# Patient Record
Sex: Female | Born: 1940 | Race: White | Hispanic: No | Smoking: Never smoker
Health system: Southern US, Community
[De-identification: ages and names within clinical notes are randomized; demographics above are authoritative.]

## PROBLEM LIST (undated history)

## (undated) DIAGNOSIS — M810 Age-related osteoporosis without current pathological fracture: Secondary | ICD-10-CM

## (undated) DIAGNOSIS — E669 Obesity, unspecified: Secondary | ICD-10-CM

## (undated) DIAGNOSIS — I2089 Other forms of angina pectoris: Secondary | ICD-10-CM

## (undated) DIAGNOSIS — E876 Hypokalemia: Secondary | ICD-10-CM

## (undated) DIAGNOSIS — I10 Essential (primary) hypertension: Secondary | ICD-10-CM

## (undated) DIAGNOSIS — C50919 Malignant neoplasm of unspecified site of unspecified female breast: Secondary | ICD-10-CM

## (undated) DIAGNOSIS — R531 Weakness: Secondary | ICD-10-CM

## (undated) DIAGNOSIS — R339 Retention of urine, unspecified: Secondary | ICD-10-CM

## (undated) DIAGNOSIS — A498 Other bacterial infections of unspecified site: Secondary | ICD-10-CM

## (undated) DIAGNOSIS — I208 Other forms of angina pectoris: Secondary | ICD-10-CM

## (undated) DIAGNOSIS — I639 Cerebral infarction, unspecified: Secondary | ICD-10-CM

## (undated) DIAGNOSIS — J189 Pneumonia, unspecified organism: Secondary | ICD-10-CM

## (undated) DIAGNOSIS — C50911 Malignant neoplasm of unspecified site of right female breast: Secondary | ICD-10-CM

## (undated) DIAGNOSIS — E079 Disorder of thyroid, unspecified: Secondary | ICD-10-CM

## (undated) DIAGNOSIS — IMO0002 Reserved for concepts with insufficient information to code with codable children: Secondary | ICD-10-CM

## (undated) DIAGNOSIS — N319 Neuromuscular dysfunction of bladder, unspecified: Secondary | ICD-10-CM

## (undated) DIAGNOSIS — K219 Gastro-esophageal reflux disease without esophagitis: Secondary | ICD-10-CM

## (undated) DIAGNOSIS — R1310 Dysphagia, unspecified: Secondary | ICD-10-CM

## (undated) DIAGNOSIS — B958 Unspecified staphylococcus as the cause of diseases classified elsewhere: Secondary | ICD-10-CM

## (undated) DIAGNOSIS — J9600 Acute respiratory failure, unspecified whether with hypoxia or hypercapnia: Secondary | ICD-10-CM

## (undated) DIAGNOSIS — I219 Acute myocardial infarction, unspecified: Secondary | ICD-10-CM

## (undated) DIAGNOSIS — M1990 Unspecified osteoarthritis, unspecified site: Secondary | ICD-10-CM

## (undated) DIAGNOSIS — I209 Angina pectoris, unspecified: Secondary | ICD-10-CM

## (undated) DIAGNOSIS — J302 Other seasonal allergic rhinitis: Secondary | ICD-10-CM

## (undated) DIAGNOSIS — A419 Sepsis, unspecified organism: Secondary | ICD-10-CM

## (undated) DIAGNOSIS — G809 Cerebral palsy, unspecified: Secondary | ICD-10-CM

## (undated) DIAGNOSIS — J9690 Respiratory failure, unspecified, unspecified whether with hypoxia or hypercapnia: Secondary | ICD-10-CM

## (undated) DIAGNOSIS — IMO0001 Reserved for inherently not codable concepts without codable children: Secondary | ICD-10-CM

## (undated) DIAGNOSIS — F419 Anxiety disorder, unspecified: Secondary | ICD-10-CM

## (undated) HISTORY — DX: Malignant neoplasm of unspecified site of unspecified female breast: C50.919

## (undated) HISTORY — PX: OTHER SURGICAL HISTORY: SHX169

## (undated) HISTORY — DX: Cerebral palsy, unspecified: G80.9

## (undated) HISTORY — DX: Unspecified osteoarthritis, unspecified site: M19.90

## (undated) HISTORY — PX: BREAST LUMPECTOMY: SHX2

## (undated) HISTORY — DX: Angina pectoris, unspecified: I20.9

## (undated) HISTORY — PX: RADICAL ABDOMINAL HYSTERECTOMY: SUR659

## (undated) HISTORY — DX: Anxiety disorder, unspecified: F41.9

## (undated) HISTORY — DX: Other seasonal allergic rhinitis: J30.2

## (undated) HISTORY — DX: Essential (primary) hypertension: I10

## (undated) HISTORY — DX: Unspecified staphylococcus as the cause of diseases classified elsewhere: B95.8

## (undated) HISTORY — DX: Other bacterial infections of unspecified site: A49.8

## (undated) HISTORY — DX: Age-related osteoporosis without current pathological fracture: M81.0

## (undated) HISTORY — DX: Disorder of thyroid, unspecified: E07.9

## (undated) HISTORY — DX: Pneumonia, unspecified organism: J18.9

---

## 2001-05-30 ENCOUNTER — Encounter (HOSPITAL_COMMUNITY): Admission: RE | Admit: 2001-05-30 | Discharge: 2001-06-29 | Payer: Self-pay | Admitting: Specialist

## 2001-07-11 ENCOUNTER — Encounter (HOSPITAL_COMMUNITY): Admission: RE | Admit: 2001-07-11 | Discharge: 2001-08-10 | Payer: Self-pay | Admitting: Specialist

## 2001-09-18 ENCOUNTER — Ambulatory Visit (HOSPITAL_COMMUNITY): Admission: RE | Admit: 2001-09-18 | Discharge: 2001-09-18 | Payer: Self-pay

## 2002-07-26 ENCOUNTER — Ambulatory Visit (HOSPITAL_COMMUNITY): Admission: RE | Admit: 2002-07-26 | Discharge: 2002-07-26 | Payer: Self-pay | Admitting: Internal Medicine

## 2002-07-26 ENCOUNTER — Encounter: Payer: Self-pay | Admitting: Internal Medicine

## 2002-09-29 ENCOUNTER — Emergency Department (HOSPITAL_COMMUNITY): Admission: EM | Admit: 2002-09-29 | Discharge: 2002-09-29 | Payer: Self-pay | Admitting: Emergency Medicine

## 2003-02-13 ENCOUNTER — Encounter (HOSPITAL_COMMUNITY): Admission: RE | Admit: 2003-02-13 | Discharge: 2003-03-15 | Payer: Self-pay | Admitting: Pulmonary Disease

## 2003-07-26 ENCOUNTER — Ambulatory Visit (HOSPITAL_COMMUNITY): Admission: RE | Admit: 2003-07-26 | Discharge: 2003-07-26 | Payer: Self-pay | Admitting: Pulmonary Disease

## 2003-08-01 ENCOUNTER — Ambulatory Visit (HOSPITAL_COMMUNITY): Admission: RE | Admit: 2003-08-01 | Discharge: 2003-08-01 | Payer: Self-pay | Admitting: Pulmonary Disease

## 2003-08-13 ENCOUNTER — Ambulatory Visit (HOSPITAL_COMMUNITY): Admission: RE | Admit: 2003-08-13 | Discharge: 2003-08-13 | Payer: Self-pay | Admitting: Pulmonary Disease

## 2003-09-18 ENCOUNTER — Ambulatory Visit (HOSPITAL_COMMUNITY): Admission: RE | Admit: 2003-09-18 | Discharge: 2003-09-18 | Payer: Self-pay | Admitting: Pulmonary Disease

## 2004-09-28 ENCOUNTER — Ambulatory Visit (HOSPITAL_COMMUNITY): Admission: RE | Admit: 2004-09-28 | Discharge: 2004-09-28 | Payer: Self-pay | Admitting: Pulmonary Disease

## 2004-10-28 ENCOUNTER — Emergency Department (HOSPITAL_COMMUNITY): Admission: EM | Admit: 2004-10-28 | Discharge: 2004-10-28 | Payer: Self-pay | Admitting: Emergency Medicine

## 2005-04-08 ENCOUNTER — Ambulatory Visit: Payer: Self-pay | Admitting: *Deleted

## 2005-04-13 ENCOUNTER — Ambulatory Visit (HOSPITAL_COMMUNITY): Admission: RE | Admit: 2005-04-13 | Discharge: 2005-04-13 | Payer: Self-pay | Admitting: *Deleted

## 2005-04-13 ENCOUNTER — Ambulatory Visit: Payer: Self-pay | Admitting: *Deleted

## 2005-04-19 ENCOUNTER — Ambulatory Visit: Payer: Self-pay | Admitting: *Deleted

## 2005-05-10 ENCOUNTER — Ambulatory Visit (HOSPITAL_COMMUNITY): Admission: RE | Admit: 2005-05-10 | Discharge: 2005-05-10 | Payer: Self-pay | Admitting: *Deleted

## 2005-05-10 ENCOUNTER — Ambulatory Visit: Payer: Self-pay | Admitting: *Deleted

## 2005-05-10 ENCOUNTER — Ambulatory Visit: Payer: Self-pay | Admitting: Cardiology

## 2005-05-18 ENCOUNTER — Ambulatory Visit: Payer: Self-pay | Admitting: *Deleted

## 2006-04-27 ENCOUNTER — Inpatient Hospital Stay (HOSPITAL_COMMUNITY): Admission: EM | Admit: 2006-04-27 | Discharge: 2006-04-29 | Payer: Self-pay | Admitting: Emergency Medicine

## 2006-06-23 ENCOUNTER — Observation Stay (HOSPITAL_COMMUNITY): Admission: EM | Admit: 2006-06-23 | Discharge: 2006-06-24 | Payer: Self-pay | Admitting: Emergency Medicine

## 2006-07-21 ENCOUNTER — Ambulatory Visit (HOSPITAL_COMMUNITY): Admission: RE | Admit: 2006-07-21 | Discharge: 2006-07-21 | Payer: Self-pay | Admitting: Pulmonary Disease

## 2006-08-30 ENCOUNTER — Ambulatory Visit (HOSPITAL_COMMUNITY): Admission: RE | Admit: 2006-08-30 | Discharge: 2006-08-30 | Payer: Self-pay | Admitting: Pulmonary Disease

## 2007-01-31 ENCOUNTER — Ambulatory Visit (HOSPITAL_COMMUNITY): Admission: RE | Admit: 2007-01-31 | Discharge: 2007-01-31 | Payer: Self-pay | Admitting: Family Medicine

## 2007-02-13 ENCOUNTER — Encounter (INDEPENDENT_AMBULATORY_CARE_PROVIDER_SITE_OTHER): Payer: Self-pay | Admitting: Diagnostic Radiology

## 2007-02-13 ENCOUNTER — Encounter: Admission: RE | Admit: 2007-02-13 | Discharge: 2007-02-13 | Payer: Self-pay | Admitting: Family Medicine

## 2007-02-13 ENCOUNTER — Encounter (INDEPENDENT_AMBULATORY_CARE_PROVIDER_SITE_OTHER): Payer: Self-pay | Admitting: *Deleted

## 2007-03-20 ENCOUNTER — Encounter (INDEPENDENT_AMBULATORY_CARE_PROVIDER_SITE_OTHER): Payer: Self-pay | Admitting: *Deleted

## 2007-03-20 ENCOUNTER — Ambulatory Visit (HOSPITAL_COMMUNITY): Admission: RE | Admit: 2007-03-20 | Discharge: 2007-03-20 | Payer: Self-pay | Admitting: General Surgery

## 2007-03-20 ENCOUNTER — Encounter: Admission: RE | Admit: 2007-03-20 | Discharge: 2007-03-20 | Payer: Self-pay | Admitting: General Surgery

## 2007-04-03 ENCOUNTER — Ambulatory Visit (HOSPITAL_COMMUNITY): Payer: Self-pay | Admitting: Oncology

## 2007-04-11 ENCOUNTER — Ambulatory Visit: Admission: RE | Admit: 2007-04-11 | Discharge: 2007-07-05 | Payer: Self-pay | Admitting: Radiation Oncology

## 2007-04-25 ENCOUNTER — Encounter: Admission: EM | Admit: 2007-04-25 | Discharge: 2007-04-25 | Payer: Self-pay | Admitting: Dentistry

## 2007-04-25 ENCOUNTER — Ambulatory Visit: Payer: Self-pay | Admitting: Dentistry

## 2007-04-27 ENCOUNTER — Ambulatory Visit (HOSPITAL_COMMUNITY): Admission: RE | Admit: 2007-04-27 | Discharge: 2007-04-27 | Payer: Self-pay | Admitting: Dentistry

## 2007-04-27 ENCOUNTER — Encounter (INDEPENDENT_AMBULATORY_CARE_PROVIDER_SITE_OTHER): Payer: Self-pay | Admitting: Specialist

## 2007-06-09 ENCOUNTER — Ambulatory Visit: Payer: Self-pay | Admitting: Dentistry

## 2007-07-12 ENCOUNTER — Ambulatory Visit (HOSPITAL_COMMUNITY): Payer: Self-pay | Admitting: Oncology

## 2007-08-07 ENCOUNTER — Ambulatory Visit: Payer: Self-pay | Admitting: Dentistry

## 2007-10-18 ENCOUNTER — Encounter (HOSPITAL_COMMUNITY): Admission: RE | Admit: 2007-10-18 | Discharge: 2007-11-17 | Payer: Self-pay | Admitting: Oncology

## 2007-10-30 ENCOUNTER — Ambulatory Visit (HOSPITAL_COMMUNITY): Payer: Self-pay | Admitting: Oncology

## 2008-02-26 ENCOUNTER — Ambulatory Visit (HOSPITAL_COMMUNITY): Admission: RE | Admit: 2008-02-26 | Discharge: 2008-02-26 | Payer: Self-pay | Admitting: Family Medicine

## 2008-03-30 ENCOUNTER — Emergency Department (HOSPITAL_COMMUNITY): Admission: EM | Admit: 2008-03-30 | Discharge: 2008-03-30 | Payer: Self-pay | Admitting: Emergency Medicine

## 2008-04-29 ENCOUNTER — Ambulatory Visit (HOSPITAL_COMMUNITY): Payer: Self-pay | Admitting: Oncology

## 2008-05-08 ENCOUNTER — Encounter: Admission: RE | Admit: 2008-05-08 | Discharge: 2008-05-08 | Payer: Self-pay | Admitting: General Surgery

## 2008-10-30 ENCOUNTER — Ambulatory Visit (HOSPITAL_COMMUNITY): Payer: Self-pay | Admitting: Oncology

## 2009-05-06 ENCOUNTER — Ambulatory Visit (HOSPITAL_COMMUNITY): Payer: Self-pay | Admitting: Oncology

## 2009-05-06 ENCOUNTER — Encounter (HOSPITAL_COMMUNITY): Admission: RE | Admit: 2009-05-06 | Discharge: 2009-06-05 | Payer: Self-pay | Admitting: Oncology

## 2009-06-13 ENCOUNTER — Emergency Department (HOSPITAL_COMMUNITY): Admission: EM | Admit: 2009-06-13 | Discharge: 2009-06-13 | Payer: Self-pay | Admitting: Emergency Medicine

## 2009-09-19 ENCOUNTER — Emergency Department (HOSPITAL_COMMUNITY): Admission: EM | Admit: 2009-09-19 | Discharge: 2009-09-19 | Payer: Self-pay | Admitting: Emergency Medicine

## 2009-10-14 ENCOUNTER — Emergency Department (HOSPITAL_COMMUNITY): Admission: EM | Admit: 2009-10-14 | Discharge: 2009-10-14 | Payer: Self-pay | Admitting: Emergency Medicine

## 2009-11-03 ENCOUNTER — Emergency Department (HOSPITAL_COMMUNITY): Admission: EM | Admit: 2009-11-03 | Discharge: 2009-11-03 | Payer: Self-pay | Admitting: Emergency Medicine

## 2009-11-04 ENCOUNTER — Ambulatory Visit (HOSPITAL_COMMUNITY): Payer: Self-pay | Admitting: Oncology

## 2009-11-12 ENCOUNTER — Encounter: Payer: Self-pay | Admitting: Orthopedic Surgery

## 2010-03-10 ENCOUNTER — Observation Stay (HOSPITAL_COMMUNITY): Admission: EM | Admit: 2010-03-10 | Discharge: 2010-03-13 | Payer: Self-pay | Admitting: Emergency Medicine

## 2010-03-31 ENCOUNTER — Encounter: Payer: Self-pay | Admitting: Orthopedic Surgery

## 2010-03-31 ENCOUNTER — Emergency Department (HOSPITAL_COMMUNITY): Admission: EM | Admit: 2010-03-31 | Discharge: 2010-03-31 | Payer: Self-pay | Admitting: Emergency Medicine

## 2010-04-08 ENCOUNTER — Ambulatory Visit: Payer: Self-pay | Admitting: Orthopedic Surgery

## 2010-04-08 DIAGNOSIS — S52609A Unspecified fracture of lower end of unspecified ulna, initial encounter for closed fracture: Secondary | ICD-10-CM

## 2010-04-13 ENCOUNTER — Inpatient Hospital Stay (HOSPITAL_COMMUNITY): Admission: EM | Admit: 2010-04-13 | Discharge: 2010-04-17 | Payer: Self-pay | Admitting: Emergency Medicine

## 2010-04-17 ENCOUNTER — Inpatient Hospital Stay: Admission: AD | Admit: 2010-04-17 | Discharge: 2010-05-15 | Payer: Self-pay | Admitting: Internal Medicine

## 2010-04-23 ENCOUNTER — Ambulatory Visit (HOSPITAL_COMMUNITY): Admission: RE | Admit: 2010-04-23 | Discharge: 2010-04-23 | Payer: Self-pay | Admitting: Internal Medicine

## 2010-04-28 ENCOUNTER — Ambulatory Visit (HOSPITAL_COMMUNITY): Admission: RE | Admit: 2010-04-28 | Discharge: 2010-04-28 | Payer: Self-pay | Admitting: Internal Medicine

## 2010-04-30 ENCOUNTER — Ambulatory Visit (HOSPITAL_COMMUNITY): Admission: RE | Admit: 2010-04-30 | Discharge: 2010-04-30 | Payer: Self-pay | Admitting: Internal Medicine

## 2010-05-05 ENCOUNTER — Ambulatory Visit (HOSPITAL_COMMUNITY): Payer: Self-pay | Admitting: Oncology

## 2010-05-06 ENCOUNTER — Ambulatory Visit: Payer: Self-pay | Admitting: Orthopedic Surgery

## 2010-05-06 ENCOUNTER — Ambulatory Visit (HOSPITAL_COMMUNITY): Admission: RE | Admit: 2010-05-06 | Discharge: 2010-05-06 | Payer: Self-pay | Admitting: Internal Medicine

## 2010-05-21 ENCOUNTER — Ambulatory Visit: Payer: Self-pay | Admitting: Orthopedic Surgery

## 2010-05-21 DIAGNOSIS — M75120 Complete rotator cuff tear or rupture of unspecified shoulder, not specified as traumatic: Secondary | ICD-10-CM | POA: Insufficient documentation

## 2010-05-21 DIAGNOSIS — G5600 Carpal tunnel syndrome, unspecified upper limb: Secondary | ICD-10-CM | POA: Insufficient documentation

## 2010-05-25 ENCOUNTER — Telehealth: Payer: Self-pay | Admitting: Orthopedic Surgery

## 2010-06-04 ENCOUNTER — Telehealth: Payer: Self-pay | Admitting: Orthopedic Surgery

## 2010-06-16 ENCOUNTER — Encounter: Payer: Self-pay | Admitting: Orthopedic Surgery

## 2010-07-10 ENCOUNTER — Encounter (HOSPITAL_COMMUNITY): Admission: RE | Admit: 2010-07-10 | Discharge: 2010-08-09 | Payer: Self-pay | Admitting: Family Medicine

## 2010-08-11 ENCOUNTER — Encounter (HOSPITAL_COMMUNITY): Admission: RE | Admit: 2010-08-11 | Discharge: 2010-09-10 | Payer: Self-pay | Admitting: Family Medicine

## 2010-09-03 ENCOUNTER — Emergency Department (HOSPITAL_COMMUNITY): Admission: EM | Admit: 2010-09-03 | Discharge: 2010-09-03 | Payer: Self-pay | Admitting: Emergency Medicine

## 2010-09-11 ENCOUNTER — Encounter (HOSPITAL_COMMUNITY): Admission: RE | Admit: 2010-09-11 | Discharge: 2010-09-18 | Payer: Self-pay | Admitting: Family Medicine

## 2010-09-21 ENCOUNTER — Encounter (HOSPITAL_COMMUNITY): Admission: RE | Admit: 2010-09-21 | Discharge: 2010-10-21 | Payer: Self-pay | Admitting: Family Medicine

## 2010-10-14 ENCOUNTER — Ambulatory Visit (HOSPITAL_COMMUNITY): Admission: RE | Admit: 2010-10-14 | Discharge: 2010-10-14 | Payer: Self-pay | Admitting: Pulmonary Disease

## 2010-10-22 ENCOUNTER — Encounter (HOSPITAL_COMMUNITY)
Admission: RE | Admit: 2010-10-22 | Discharge: 2010-11-21 | Payer: Self-pay | Source: Home / Self Care | Admitting: Family Medicine

## 2010-10-27 ENCOUNTER — Ambulatory Visit (HOSPITAL_COMMUNITY): Admission: RE | Admit: 2010-10-27 | Discharge: 2010-10-27 | Payer: Self-pay | Admitting: Pulmonary Disease

## 2010-11-04 ENCOUNTER — Ambulatory Visit (HOSPITAL_COMMUNITY): Payer: Self-pay | Admitting: Oncology

## 2010-11-04 ENCOUNTER — Encounter (HOSPITAL_COMMUNITY)
Admission: RE | Admit: 2010-11-04 | Discharge: 2010-12-04 | Payer: Self-pay | Source: Home / Self Care | Attending: Oncology | Admitting: Oncology

## 2010-12-02 ENCOUNTER — Ambulatory Visit: Payer: Self-pay | Admitting: Orthopedic Surgery

## 2010-12-02 DIAGNOSIS — M76899 Other specified enthesopathies of unspecified lower limb, excluding foot: Secondary | ICD-10-CM | POA: Insufficient documentation

## 2010-12-02 DIAGNOSIS — M1710 Unilateral primary osteoarthritis, unspecified knee: Secondary | ICD-10-CM

## 2011-01-09 ENCOUNTER — Encounter: Payer: Self-pay | Admitting: Pulmonary Disease

## 2011-01-15 LAB — CBC
HCT: 43.6 % (ref 36.0–46.0)
Hemoglobin: 13.5 g/dL (ref 12.0–15.0)
MCH: 27.7 pg (ref 26.0–34.0)
MCHC: 31 g/dL (ref 30.0–36.0)
MCV: 89.3 fL (ref 78.0–100.0)
Platelets: 199 10*3/uL (ref 150–400)
RBC: 4.88 MIL/uL (ref 3.87–5.11)
RDW: 14.7 % (ref 11.5–15.5)
WBC: 5.5 10*3/uL (ref 4.0–10.5)

## 2011-01-15 LAB — BASIC METABOLIC PANEL
BUN: 9 mg/dL (ref 6–23)
CO2: 31 mEq/L (ref 19–32)
Calcium: 9.3 mg/dL (ref 8.4–10.5)
Chloride: 100 mEq/L (ref 96–112)
Creatinine, Ser: 0.5 mg/dL (ref 0.4–1.2)
GFR calc Af Amer: >60 mL/min (ref >60)
GFR calc non Af Amer: >60 mL/min (ref >60)
Glucose, Bld: 95 mg/dL (ref 70–99)
Potassium: 4.3 mEq/L (ref 3.5–5.1)
Sodium: 139 mEq/L (ref 135–145)

## 2011-01-15 LAB — SURGICAL PCR SCREEN
MRSA, PCR: POSITIVE — AB
Staphylococcus aureus: POSITIVE — AB

## 2011-01-18 ENCOUNTER — Ambulatory Visit (HOSPITAL_COMMUNITY)
Admission: RE | Admit: 2011-01-18 | Discharge: 2011-01-19 | Payer: Self-pay | Source: Home / Self Care | Attending: Orthopedic Surgery | Admitting: Orthopedic Surgery

## 2011-01-19 NOTE — Progress Notes (Signed)
Summary: call from [facility] physical therapist   Phone Note Other Incoming   Caller: Physical therapist Summary of Call: Misty  from [facility] called requesting information about ulna fracture. She has been seeing patient; has order re: shoulder/rotator cuff.  Will be seeing patient again tomorrow, Fri, 06/05/10.  Said had sent a fax request re: weight-bearing status - in Dr's box. Please call at direct ph [phone] Initial call taken by: Cammie Sickle,  June 04, 2010 9:01 AM  Follow-up for Phone Call        called and lmom  Follow-up by: Ether Griffins,  June 04, 2010 1:29 PM

## 2011-01-19 NOTE — Letter (Signed)
Summary: History form  History form   Imported By: Jacklynn Ganong 04/09/2010 10:26:59  _____________________________________________________________________  External Attachment:    Type:   Image     Comment:   External Document

## 2011-01-19 NOTE — Miscellaneous (Signed)
Summary: [facility] orders  [facility] orders   Imported By: Jacklynn Ganong 06/16/2010 11:14:51  _____________________________________________________________________  External Attachment:    Type:   Image     Comment:   External Document

## 2011-01-19 NOTE — Miscellaneous (Signed)
Summary: Home physical therapy order  Home physical therapy order   Imported By: Cammie Sickle 05/27/2010 18:00:58  _____________________________________________________________________  External Attachment:    Type:   Image     Comment:   External Document

## 2011-01-19 NOTE — Letter (Signed)
Summary: [facility] Dr Edger [facility]  [facility] records Dr Edger House   Imported By: Cammie Sickle 05/13/2010 12:32:37  _____________________________________________________________________  External Attachment:    Type:   Image     Comment:   External Document

## 2011-01-19 NOTE — Assessment & Plan Note (Signed)
Summary: 2 WK RE-CK/XRAY RT ARM/FX CARE/CAF   Visit Type:  Follow-up Referring Provider:  [facility] Primary Provider:  Dr. Janna Arch  CC:  fx care rt arm.  History of Present Illness:  70 year old female injured on March 31, 2010 injured her RIGHT arm twisting it while using her walker she    She reports some numbness and tingling in the hand.  INITIAL Xrays [facility]  03/31/10.  Meds: Aromasin, Singulair, Potassium, Synthroid, Losartan/HCTZ, Topiranate, Xanax, Asa, Tylenol.  No med needed FOR  pain.   Feeling better, has some numbness in right hand, had before injury.  No NCS for bilateral hand numbness.  Also has notes from Dr.McGinley on shoulders, for review.       Allergies: 1)  ! Naprosyn 2)  ! * Pharfon  Past History:  Past medical, surgical, family and social histories (including risk factors) reviewed, and no changes noted (except as noted below).  Past Medical History: Reviewed history from 04/08/2010 and no changes required. cerebral palsy asthma seasonal allergies thyroid htn anxiety  Past Surgical History: Reviewed history from 04/08/2010 and no changes required. lumpectomy hysterectomy  Family History: Reviewed history from 04/08/2010 and no changes required. FH of Cancer:  Family History of Diabetes Family History of Arthritis Hx, family, asthma  Social History: Reviewed history from 04/08/2010 and no changes required. Patient is single.  no smoking no alcohol use 3 sodas per day  Review of Systems       RIGHT shoulder pain, chronic rotator cuff tear, notes from Dr. Althea Charon indicate multiple injections have been tried  Bilateral numbness and tingling and feeling that the hands are going cold.   Shoulder/Elbow Exam  General:    Well-developed, well-nourished, large body habitus; no deformities, normal grooming.    Vascular:    Radial, ulnar, brachial, and axillary pulses 2+ and symmetric; capillary refill less than 2 seconds; no  evidence of ischemia, clubbing, or cyanosis.    Motor:    decreased R-supraspinatus, decreased R-ER'S, and decreased R-IR'S.    Shoulder Exam:    Right:    Inspection:  Abnormal    Palpation:  Abnormal       Location:  right deltoid    Stability:  stable    Range of Motion:       Flexion-Active: 50       Flexion-Passive: 90   Impression & Recommendations:  Problem # 1:  CARPAL TUNNEL SYNDROME, BILATERAL (ICD-354.0) Assessment New  Orders: Est. Patient Level III (82956) Depo- Medrol 40mg  (J1030) Joint Aspirate / Injection, Large (20610)  Problem # 2:  COMPLETE RUPTURE OF ROTATOR CUFF (ICD-727.61) Assessment: New  Orders: Est. Patient Level III (21308) Depo- Medrol 40mg  (J1030) Joint Aspirate / Injection, Large (20610)  Problem # 3:  CLOSED FRACTURE OF DISTAL END OF ULNA (MVH-846.96) Assessment: Improved  Orders: Post-Op Check (29528) Forearm x-ray, 2 views (41324) healed fracture ulna with callous formation bridging on both views the fracture line still visible.  Patient Instructions: 1)  problem #1 RIGHT forearm fracture. X-rays show fracture to be healed splint can be removed. 2)  Problem #2 bilateral carpal tunnel syndrome, bilateral carpal tunnel splint to wear at night. 3)  Problem #3 chronic rotator cuff tear, RIGHT shoulder, status post injection. You have received an injection of cortisone today. You may experience increased pain at the injection site. Apply ice pack to the area for 20 minutes every 2 hours and take 2 xtra strength tylenol every 8 hours. This increased pain will usually  resolve in 24 hours. The injection will take effect in 3-10 days.  4)  Physical therapy for RIGHT shoulder chronic rotator cuff tear 5)  Please schedule a follow-up appointment as needed.

## 2011-01-19 NOTE — Assessment & Plan Note (Signed)
Summary: [facility] fx rt wrist 03/31/10 needs new xr medicare/bsf   Vital Signs:  Patient profile:   70 year old female Height:      65 inches Weight:      218 pounds Pulse rate:   62 / minute Resp:     16 per minute  Vitals Entered By: Fuller Canada MD (April 08, 2010 8:57 AM)  Visit Type:  new patient Referring Provider:  [facility] Primary Provider:  Dr. Janna Arch  CC:  rt forearm fracture.  History of Present Illness: a 70 year old female injured on March 31, 2010 injured her RIGHT arm twisting it while using her walker she did not fall.  Pain is relieved by Tylenol.  Pain level is a 3.  Pain is intermittent.  Worse at night and in the morning.  She reports some numbness and tingling in the hand.  Xrays [facility] for review right forearm 03/31/10.  Meds: Aromasin, Singulair, Potassium, Synthroid, Losartan/HCTZ, Topiranate, Xanax, Asa, Tylenol.  Norco 5 and Ibuprofen     Allergies (verified): 1)  ! Naprosyn 2)  ! * Pharfon  Past History:  Past Medical History: cerebral palsy asthma seasonal allergies thyroid htn anxiety  Past Surgical History: lumpectomy hysterectomy  Family History: FH of Cancer:  Family History of Diabetes Family History of Arthritis Hx, family, asthma  Social History: Patient is single.  no smoking no alcohol use 3 sodas per day  Review of Systems Constitutional:  Complains of fatigue; denies weight loss, weight gain, fever, and chills. Cardiovascular:  Denies chest pain, palpitations, fainting, and murmurs. Respiratory:  Complains of short of breath; denies wheezing, couch, tightness, pain on inspiration, and snoring . Gastrointestinal:  Complains of nausea and constipation; denies heartburn, vomiting, diarrhea, and blood in your stools. Genitourinary:  Denies frequency, urgency, difficulty urinating, painful urination, flank pain, and bleeding in urine. Neurologic:  Complains of numbness and tingling; denies unsteady gait, dizziness,  tremors, and seizure. Musculoskeletal:  Complains of stiffness and muscle pain; denies joint pain, swelling, instability, redness, and heat. Endocrine:  Complains of excessive thirst; denies exessive urination and heat or cold intolerance. Psychiatric:  Complains of nervousness, depression, and anxiety; denies hallucinations. Skin:  Denies changes in the skin, poor healing, rash, itching, and redness. HEENT:  Denies blurred or double vision, eye pain, redness, and watering. Immunology:  Denies seasonal allergies, sinus problems, and allergic to bee stings. Hemoatologic:  Denies easy bleeding and brusing.  Physical Exam  Additional Exam:  Vitals are stable as recorded  General appearance heavyset  She is oriented x3  Her mood is excellent  She is ambulatory with a walker today  She has good pulse in the RIGHT upper extremity  Lymph edema or lymphangitis no lymph nodes positive in the RIGHT upper extremity  Gross sensation is intact  Reflexes were deferred  Coordination and balance deferred  Skin was intact  Grip strength is weak  We're stability was normal  Range of motion was normal passively.  Tenderness over the distal to mid ulna.  Elbow nontender no deformity  Shoulder nontender no deformity     Impression & Recommendations:  Problem # 1:  CLOSED FRACTURE OF DISTAL END OF ULNA (ZOX-096.04) Assessment New  x-rays 2 views RIGHT upper extremity as a nondisplaced distal third ulnar shaft fracture  Short-arm cast to allow her to weight-bear with her walker  Followup 6 weeks x-rays out of plaster  Orders: New Patient Level III (54098)  Patient Instructions: 1)  x-rays out of plaster  RIGHT upper extremity 6 weeks

## 2011-01-19 NOTE — Miscellaneous (Signed)
Summary: Nursing Home order  [facility] Home order   Imported By: Cammie Sickle 05/13/2010 12:29:43  _____________________________________________________________________  External Attachment:    Type:   Image     Comment:   External Document

## 2011-01-19 NOTE — Assessment & Plan Note (Signed)
Summary: check cast, too [email or web address]   Visit Type:  Follow-up Referring Provider:  [facility] Primary Provider:  Dr. Janna Arch  CC:  check cast.  History of Present Illness:  70 year old female injured on March 31, 2010 injured her RIGHT arm twisting it while using her walker she did not fall.  Pain is relieved by Tylenol.  Pain level is a 3.  Pain is intermittent.  Worse at night and in the morning.  She reports some numbness and tingling in the hand.  Xrays [facility] for review right forearm 03/31/10.  Meds: Aromasin, Singulair, Potassium, Synthroid, Losartan/HCTZ, Topiranate, Xanax, Asa, Tylenol.  Norco 5 and Ibuprofen for pain.  Today is in for cast check, complains of tightness and the cast is heavy makes her shoulder ache, she also has rubbign of the cast.  Casted 04/08/10.  Cast is removed there are some areas of redness proximally and around the thumb no breakthrough and the skin a piece of fluid was found in the cast  No tenderness at the fracture site callus not as noted  Recommend brace come back 2 weeks for final x-ray      Allergies: 1)  ! Naprosyn 2)  ! * Pharfon   Impression & Recommendations:  Problem # 1:  CLOSED FRACTURE OF DISTAL END OF ULNA (GNF-621.30) Assessment Comment Only  Orders: Post-Op Check (86578)  Patient Instructions: 1)  In 2 weeks xrays of the right wrist are needed  2)  wear brace for except bathing

## 2011-01-19 NOTE — Progress Notes (Signed)
Summary: Does she need CTS surgery?  Phone Note Call from Patient   Summary of Call: [name] (Apr 10, 2041) is asking if she needs surgery for her CTS.  Her right is worse.  Said you checked both when she was last here. Her # [phone] Initial call taken by: Jacklynn Ganong,  May 25, 2010 10:31 AM  Follow-up for Phone Call        2)  Problem #2 bilateral carpal tunnel syndrome, bilateral carpal tunnel splint to wear at night. Follow-up by: Fuller Canada MD,  May 25, 2010 10:44 AM  Additional Follow-up for Phone Call Additional follow up Details #1::        Left a message to call our office Additional Follow-up by: Jacklynn Ganong,  May 25, 2010 11:27 AM    Additional Follow-up for Phone Call Additional follow up Details #2::    Advised the patient of doctor's reply

## 2011-01-21 NOTE — Assessment & Plan Note (Signed)
Summary: rt kne pain req inject/needs xr/mcr/mcd/bsf   Vital Signs:  Patient profile:   70 year old female Height:      64 inches Weight:      205 pounds Pulse rate:   82 / minute Resp:     18 per minute  Vitals Entered By: Fuller Canada MD (December 02, 2010 3:41 PM)  Visit Type:  new problem Referring Provider:  [facility] Primary Provider:  Dr. Janna Arch  CC:  right knee pain.  History of Present Illness: I saw [name] in the office today for a  visit.  She is a 70 years old woman with the complaint of:  right knee pain.  Xrays today.  No injury.  Dr. Edger House notes for review, last seen 04/01/09 for her knees, has been treated for DJD bilateral knees by him with injections.  Meds: Synthroid, Xanax, Potassium, Calcium.  A 70 year old female, previously, followed and each [place], who presents with RIGHT knee pain for the last 3 weeks. No associated injury. She describes a sharp, burning 8/10. Intermittent pain, which came on suddenly and the last all day. Her pain is relieved with Aspercreme worse with walking associated with some catching. Locates the pain to her medial knee and calf. Other findings include stiffness.  Patient has a history of CP she uses a walker  Allergies: 1)  ! Naprosyn 2)  ! * Pharfon  Past History:  Past Surgical History: Last updated: 04/08/2010 lumpectomy hysterectomy  Family History: Last updated: 04/08/2010 FH of Cancer:  Family History of Diabetes Family History of Arthritis Hx, family, asthma  Social History: Last updated: 12/02/2010 Patient is single.  disabled no smoking no alcohol use 5 sodas per day [employer]  Family history reviewed for relevance to current acute and chronic problems. Social history (including risk factors) reviewed for relevance to current acute and chronic problems.  Past Medical History: cerebral palsy asthma seasonal allergies thyroid htn anxiety arthritis  Family  History: Reviewed history from 04/08/2010 and no changes required. FH of Cancer:  Family History of Diabetes Family History of Arthritis Hx, family, asthma  Social History: Reviewed history from 04/08/2010 and no changes required. Patient is single.  disabled no smoking no alcohol use 5 sodas per day [employer]  Review of Systems Respiratory:  Complains of short of breath, wheezing, and couch; denies tightness, pain on inspiration, and snoring . Gastrointestinal:  Complains of constipation; denies heartburn, nausea, vomiting, diarrhea, and blood in your stools. Genitourinary:  Complains of urgency; denies frequency, difficulty urinating, painful urination, flank pain, and bleeding in urine. Musculoskeletal:  Complains of muscle pain; denies joint pain, swelling, instability, stiffness, redness, and heat. Psychiatric:  Complains of nervousness and anxiety; denies depression and hallucinations.  The review of systems is negative for Constitutional, Cardiovascular, Neurologic, Endocrine, Skin, HEENT, Immunology, and Hemoatologic.  Physical Exam  Additional Exam:  general appearance is normal. Vital signs as recorded.  The patient has some mild facial deformity secondary to underlying genetic disease process.  She is oriented x3. Her mood and affect are pleasant.  She ambulates with assistive devices with an altered gait pattern.  Regarding the RIGHT knee. She complains of pain over the medial side of the knee, which is tender and swollen. She exhibits normal range of motion her knee is stable. She has normal strength in the quadriceps.  The skin is intact. Temperature the limb is normal. There is no edema or swelling. The joint appears to be fluid free.  Normal sensation as noted in the RIGHT lower extremity.  There is thumb  in palm deformity of the RIGHT hand.     Impression & Recommendations:  Problem # 1:  DEGENERATIVE JOINT DISEASE, RIGHT KNEE  (ICD-715.96) Assessment New  3 views right knee    There is modest or moderate valgus osteoarthritis with minimal valgus deformity. There is lateral patellofemoral arthritis and significant amount of osteopenia.  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 Patient Level IV (04540)  Problem # 2:  BURSITIS, KNEE (ICD-726.60) Assessment: New  RIGHT knee pes bursal injection. the skin is prepped with alcohol and ethyl chloride, and the pes anserine tendons bursa is injected with Depo-Medrol 40 mg per cc, 1 cc, and lidocaine 1% 3 cc. No complications. Verbal consent was obtained prior to injection.  Orders: Est. Patient Level IV (98119) Knee x-ray,  3 views (14782)  Patient Instructions: 1)  You have received an injection of cortisone today. You may experience increased pain at the injection site. Apply ice pack to the area for 20 minutes every 2 hours and take 2 xtra strength tylenol every 8 hours. This increased pain will usually resolve in 24 hours. The injection will take effect in 3-10 days.   2)  may repeat in 3 months if needed    Orders Added: 1)  Est. Patient Level IV [95621] 2)  Knee x-ray,  3 views [73562]

## 2011-01-21 NOTE — Letter (Signed)
Summary: History form  History form   Imported By: Jacklynn Ganong 12/07/2010 14:56:51  _____________________________________________________________________  External Attachment:    Type:   Image     Comment:   External Document

## 2011-03-03 ENCOUNTER — Emergency Department (HOSPITAL_COMMUNITY): Payer: Medicare Other

## 2011-03-03 ENCOUNTER — Emergency Department (HOSPITAL_COMMUNITY)
Admission: EM | Admit: 2011-03-03 | Discharge: 2011-03-03 | Disposition: A | Discharge status: Home / Self Care | Payer: Medicare Other | Attending: Emergency Medicine | Admitting: Emergency Medicine

## 2011-03-03 DIAGNOSIS — F411 Generalized anxiety disorder: Secondary | ICD-10-CM | POA: Insufficient documentation

## 2011-03-03 DIAGNOSIS — Z853 Personal history of malignant neoplasm of breast: Secondary | ICD-10-CM | POA: Insufficient documentation

## 2011-03-03 DIAGNOSIS — G809 Cerebral palsy, unspecified: Secondary | ICD-10-CM | POA: Insufficient documentation

## 2011-03-03 DIAGNOSIS — E039 Hypothyroidism, unspecified: Secondary | ICD-10-CM | POA: Insufficient documentation

## 2011-03-03 DIAGNOSIS — J209 Acute bronchitis, unspecified: Secondary | ICD-10-CM | POA: Insufficient documentation

## 2011-03-03 DIAGNOSIS — R0609 Other forms of dyspnea: Secondary | ICD-10-CM | POA: Insufficient documentation

## 2011-03-03 DIAGNOSIS — I10 Essential (primary) hypertension: Secondary | ICD-10-CM | POA: Insufficient documentation

## 2011-03-03 DIAGNOSIS — M129 Arthropathy, unspecified: Secondary | ICD-10-CM | POA: Insufficient documentation

## 2011-03-03 DIAGNOSIS — R0989 Other specified symptoms and signs involving the circulatory and respiratory systems: Secondary | ICD-10-CM | POA: Insufficient documentation

## 2011-03-04 LAB — POCT CARDIAC MARKERS
CKMB, poc: 2 ng/mL (ref 1.0–8.0)
Myoglobin, poc: 53.4 ng/mL (ref 12–200)
Troponin i, poc: <0.05 ng/mL (ref 0.00–0.09)

## 2011-03-04 LAB — DIFFERENTIAL
Lymphocytes Relative: 25 % (ref 12–46)
Lymphs Abs: 0.9 10*3/uL (ref 0.7–4.0)
Monocytes Relative: 7 % (ref 3–12)
Neutro Abs: 2.4 10*3/uL (ref 1.7–7.7)
Neutrophils Relative %: 65 % (ref 43–77)

## 2011-03-04 LAB — CBC
Hemoglobin: 12.9 g/dL (ref 12.0–15.0)
MCH: 29.3 pg (ref 26.0–34.0)
RBC: 4.42 MIL/uL (ref 3.87–5.11)
WBC: 3.7 10*3/uL — ABNORMAL LOW (ref 4.0–10.5)

## 2011-03-04 LAB — BASIC METABOLIC PANEL
CO2: 27 mEq/L (ref 19–32)
Calcium: 8.8 mg/dL (ref 8.4–10.5)
GFR calc Af Amer: >60 mL/min (ref >60)
GFR calc non Af Amer: >60 mL/min (ref >60)
Sodium: 140 mEq/L (ref 135–145)

## 2011-03-09 LAB — BASIC METABOLIC PANEL
BUN: 8 mg/dL (ref 6–23)
BUN: 9 mg/dL (ref 6–23)
CO2: 24 mEq/L (ref 19–32)
CO2: 24 mEq/L (ref 19–32)
CO2: 26 mEq/L (ref 19–32)
CO2: 31 mEq/L (ref 19–32)
Calcium: 9.2 mg/dL (ref 8.4–10.5)
Chloride: 103 mEq/L (ref 96–112)
Chloride: 106 mEq/L (ref 96–112)
Chloride: 107 mEq/L (ref 96–112)
Chloride: 109 mEq/L (ref 96–112)
Creatinine, Ser: 0.69 mg/dL (ref 0.4–1.2)
GFR calc Af Amer: >60 mL/min (ref >60)
GFR calc Af Amer: >60 mL/min (ref >60)
GFR calc non Af Amer: >60 mL/min (ref >60)
GFR calc non Af Amer: >60 mL/min (ref >60)
Glucose, Bld: 102 mg/dL — ABNORMAL HIGH (ref 70–99)
Glucose, Bld: 102 mg/dL — ABNORMAL HIGH (ref 70–99)
Glucose, Bld: 99 mg/dL (ref 70–99)
Potassium: 4.4 mEq/L (ref 3.5–5.1)
Potassium: 4.5 mEq/L (ref 3.5–5.1)
Sodium: 138 mEq/L (ref 135–145)
Sodium: 140 mEq/L (ref 135–145)

## 2011-03-09 LAB — DIFFERENTIAL
Eosinophils Absolute: 0.1 10*3/uL (ref 0.0–0.7)
Eosinophils Relative: 2 % (ref 0–5)
Lymphs Abs: 0.8 10*3/uL (ref 0.7–4.0)
Monocytes Absolute: 0.3 10*3/uL (ref 0.1–1.0)

## 2011-03-09 LAB — COMPREHENSIVE METABOLIC PANEL
ALT: 12 U/L (ref 0–35)
AST: 15 U/L (ref 0–37)
Albumin: 3.9 g/dL (ref 3.5–5.2)
CO2: 32 mEq/L (ref 19–32)
Calcium: 9.1 mg/dL (ref 8.4–10.5)
Chloride: 101 mEq/L (ref 96–112)
GFR calc Af Amer: >60 mL/min (ref >60)
GFR calc non Af Amer: >60 mL/min (ref >60)
Sodium: 141 mEq/L (ref 135–145)
Total Bilirubin: 0.6 mg/dL (ref 0.3–1.2)

## 2011-03-09 LAB — PROTIME-INR: Prothrombin Time: 13.6 seconds (ref 11.6–15.2)

## 2011-03-09 LAB — CBC
Platelets: 187 10*3/uL (ref 150–400)
RBC: 4.02 MIL/uL (ref 3.87–5.11)
WBC: 3.7 10*3/uL — ABNORMAL LOW (ref 4.0–10.5)

## 2011-03-14 LAB — DIFFERENTIAL
Eosinophils Absolute: 0.1 10*3/uL (ref 0.0–0.7)
Lymphs Abs: 0.8 10*3/uL (ref 0.7–4.0)
Monocytes Relative: 8 % (ref 3–12)
Neutrophils Relative %: 69 % (ref 43–77)

## 2011-03-14 LAB — BASIC METABOLIC PANEL
BUN: 7 mg/dL (ref 6–23)
CO2: 27 mEq/L (ref 19–32)
Calcium: 8.5 mg/dL (ref 8.4–10.5)
Chloride: 100 mEq/L (ref 96–112)
Creatinine, Ser: 0.53 mg/dL (ref 0.4–1.2)
Creatinine, Ser: 0.77 mg/dL (ref 0.4–1.2)
GFR calc Af Amer: >60 mL/min (ref >60)
GFR calc Af Amer: >60 mL/min (ref >60)
Potassium: 3 mEq/L — ABNORMAL LOW (ref 3.5–5.1)

## 2011-03-14 LAB — POCT CARDIAC MARKERS
CKMB, poc: 2.8 ng/mL (ref 1.0–8.0)
Myoglobin, poc: 45.9 ng/mL (ref 12–200)
Myoglobin, poc: 99 ng/mL (ref 12–200)
Troponin i, poc: <0.05 ng/mL (ref 0.00–0.09)

## 2011-03-14 LAB — CBC
MCV: 87.3 fL (ref 78.0–100.0)
RBC: 4.26 MIL/uL (ref 3.87–5.11)
WBC: 3.9 10*3/uL — ABNORMAL LOW (ref 4.0–10.5)

## 2011-03-14 LAB — CARDIAC PANEL(CRET KIN+CKTOT+MB+TROPI)
CK, MB: 2.9 ng/mL (ref 0.3–4.0)
CK, MB: 3.4 ng/mL (ref 0.3–4.0)
Relative Index: INVALID (ref 0.0–2.5)
Relative Index: INVALID (ref 0.0–2.5)
Relative Index: INVALID (ref 0.0–2.5)
Total CK: 90 U/L (ref 7–177)
Troponin I: 0.01 ng/mL (ref 0.00–0.06)
Troponin I: 0.01 ng/mL (ref 0.00–0.06)
Troponin I: 0.02 ng/mL (ref 0.00–0.06)
Troponin I: <0.01 ng/mL (ref 0.00–0.06)

## 2011-03-17 NOTE — Op Note (Signed)
  NAMEJAYDE, [name]                ACCOUNT NO.:  [id number]  MEDICAL RECORD NO.:  [id number]          PATIENT TYPE:  OIB  LOCATION:  5016                         FACILITY:  [facility]  PHYSICIAN:  Artist Pais. Tymere Depuy, M.D.DATE OF BIRTH:  06-Oct-1941  DATE OF PROCEDURE:  01/18/2011 DATE OF DISCHARGE:                              OPERATIVE REPORT   PREOPERATIVE DIAGNOSIS:  Bilateral carpal tunnel syndrome.  POSTOPERATIVE DIAGNOSIS:  Bilateral carpal tunnel syndrome.  PROCEDURE:  Right carpal tunnel release, inject left carpal tunnel.  SURGEON:  Artist Pais. Mina Marble, MD  ASSISTANT:  None.  ANESTHESIA:  General.  TOURNIQUET TIME:  11 minutes.  COMPLICATIONS:  None.  DRAINS:  None.  The patient was taken to the operating suite after induction of adequate general anesthesia.  Right upper extremity was prepped and draped in sterile fashion.  An Esmarch was used to exsanguinate the limb. Tourniquet was then inflated to 250 mmHg.  At this point in time, a 2-cm incision was made in the palmar aspect of the right hand in line with long finger metacarpal starting at [facility] cardinal line.  Skin was incised.  Palmar fascia was identified and split.  Distal edge of the transverse carpal ligament was identified and split with 15 blade and median nerve was identified, protected with a Therapist, nutritional.  The remaining aspect of the transverse carpal ligament then divided under direct vision using curved blunt scissor.  Canal was inspected.  There were no osseous lesions again was present.  It was irrigated and loosely closed.  A 3-0 Prolene subcuticular stitch, Steri-Strips, 4x4s, fluffs and compressive dressing was applied.  Under sterile condition, the left carpal tunnel was injected with 1 mL of Celestone.  Band-Aid was placed at the injection site.  The patient tolerated both procedures well and went to the recovery room in stable fashion.    Artist Pais Mina Marble, M.D.    MAW/MEDQ  D:   01/18/2011  T:  01/19/2011  Job:  [id number]  Electronically Signed by Dairl Ponder M.D. on 03/17/2011 11:23:16 AM

## 2011-03-24 LAB — URINE CULTURE: Colony Count: 5000

## 2011-03-24 LAB — URINALYSIS, ROUTINE W REFLEX MICROSCOPIC
Glucose, UA: NEGATIVE mg/dL
Leukocytes, UA: NEGATIVE
Protein, ur: 30 mg/dL — AB
Specific Gravity, Urine: 1.025 (ref 1.005–1.030)
pH: 5 (ref 5.0–8.0)

## 2011-03-24 LAB — URINE MICROSCOPIC-ADD ON

## 2011-03-24 LAB — CBC
MCHC: 33.8 g/dL (ref 30.0–36.0)
MCV: 89.1 fL (ref 78.0–100.0)
RDW: 14.4 % (ref 11.5–15.5)

## 2011-03-24 LAB — BASIC METABOLIC PANEL
BUN: 13 mg/dL (ref 6–23)
CO2: 30 mEq/L (ref 19–32)
Calcium: 9.4 mg/dL (ref 8.4–10.5)
Chloride: 97 mEq/L (ref 96–112)
Creatinine, Ser: 0.63 mg/dL (ref 0.4–1.2)
Glucose, Bld: 97 mg/dL (ref 70–99)

## 2011-03-24 LAB — DIFFERENTIAL
Basophils Absolute: 0 10*3/uL (ref 0.0–0.1)
Basophils Relative: 0 % (ref 0–1)
Eosinophils Absolute: 0 10*3/uL (ref 0.0–0.7)
Monocytes Relative: 4 % (ref 3–12)
Neutro Abs: 6.4 10*3/uL (ref 1.7–7.7)
Neutrophils Relative %: 83 % — ABNORMAL HIGH (ref 43–77)

## 2011-03-25 LAB — DIFFERENTIAL
Basophils Absolute: 0 10*3/uL (ref 0.0–0.1)
Basophils Absolute: 0 10*3/uL (ref 0.0–0.1)
Basophils Relative: 0 % (ref 0–1)
Eosinophils Absolute: 0.1 10*3/uL (ref 0.0–0.7)
Eosinophils Absolute: 0 10*3/uL (ref 0.0–0.7)
Eosinophils Relative: 1 % (ref 0–5)
Lymphocytes Relative: 22 % (ref 12–46)
Lymphocytes Relative: 30 % (ref 12–46)
Lymphs Abs: 1.2 K/uL (ref 0.7–4.0)
Lymphs Abs: 1.9 10*3/uL (ref 0.7–4.0)
Monocytes Absolute: 0.4 10*3/uL (ref 0.1–1.0)
Monocytes Relative: 7 % (ref 3–12)
Neutro Abs: 3.6 K/uL (ref 1.7–7.7)
Neutrophils Relative %: 69 % (ref 43–77)
Neutrophils Relative %: 61 % (ref 43–77)

## 2011-03-25 LAB — POCT CARDIAC MARKERS
CKMB, poc: 2.9 ng/mL (ref 1.0–8.0)
Myoglobin, poc: 98.4 ng/mL (ref 12–200)
Myoglobin, poc: 97.4 ng/mL (ref 12–200)
Troponin i, poc: <0.05 ng/mL (ref 0.00–0.09)

## 2011-03-25 LAB — URINE MICROSCOPIC-ADD ON

## 2011-03-25 LAB — URINALYSIS, ROUTINE W REFLEX MICROSCOPIC
Bilirubin Urine: NEGATIVE
Glucose, UA: NEGATIVE mg/dL
Ketones, ur: NEGATIVE mg/dL
Leukocytes, UA: NEGATIVE
Nitrite: NEGATIVE
Protein, ur: NEGATIVE mg/dL
Specific Gravity, Urine: 1.025 (ref 1.005–1.030)
Urobilinogen, UA: 0.2 mg/dL (ref 0.0–1.0)
pH: 5.5 (ref 5.0–8.0)

## 2011-03-25 LAB — CBC
HCT: 37.6 % (ref 36.0–46.0)
HCT: 37.7 % (ref 36.0–46.0)
Hemoglobin: 13 g/dL (ref 12.0–15.0)
Hemoglobin: 12.9 g/dL (ref 12.0–15.0)
MCHC: 34.4 g/dL (ref 30.0–36.0)
MCHC: 34.1 g/dL (ref 30.0–36.0)
MCV: 89.5 fL (ref 78.0–100.0)
Platelets: 210 K/uL (ref 150–400)
RBC: 4.2 MIL/uL (ref 3.87–5.11)
RBC: 4.22 MIL/uL (ref 3.87–5.11)
RDW: 14.6 % (ref 11.5–15.5)
WBC: 5.3 K/uL (ref 4.0–10.5)

## 2011-03-25 LAB — BASIC METABOLIC PANEL
CO2: 33 mEq/L — ABNORMAL HIGH (ref 19–32)
CO2: 30 mEq/L (ref 19–32)
Calcium: 9.1 mg/dL (ref 8.4–10.5)
Chloride: 100 mEq/L (ref 96–112)
Chloride: 100 mEq/L (ref 96–112)
GFR calc Af Amer: >60 mL/min (ref >60)
GFR calc Af Amer: >60 mL/min (ref >60)
Glucose, Bld: 95 mg/dL (ref 70–99)
Glucose, Bld: 87 mg/dL (ref 70–99)
Potassium: 2.8 mEq/L — ABNORMAL LOW (ref 3.5–5.1)
Sodium: 139 mEq/L (ref 135–145)
Sodium: 139 mEq/L (ref 135–145)

## 2011-03-25 LAB — BASIC METABOLIC PANEL WITH GFR
BUN: 18 mg/dL (ref 6–23)
Calcium: 9.1 mg/dL (ref 8.4–10.5)
Creatinine, Ser: 0.86 mg/dL (ref 0.4–1.2)
GFR calc non Af Amer: >60 mL/min (ref >60)
Potassium: 2.8 meq/L — ABNORMAL LOW (ref 3.5–5.1)

## 2011-05-05 ENCOUNTER — Ambulatory Visit (HOSPITAL_COMMUNITY): Payer: Self-pay | Admitting: Oncology

## 2011-05-07 ENCOUNTER — Other Ambulatory Visit (HOSPITAL_COMMUNITY): Payer: Self-pay | Admitting: Oncology

## 2011-05-07 ENCOUNTER — Encounter (HOSPITAL_COMMUNITY): Payer: Medicare Other | Attending: Oncology | Admitting: Oncology

## 2011-05-07 DIAGNOSIS — Z853 Personal history of malignant neoplasm of breast: Secondary | ICD-10-CM | POA: Insufficient documentation

## 2011-05-07 DIAGNOSIS — Z79899 Other long term (current) drug therapy: Secondary | ICD-10-CM | POA: Insufficient documentation

## 2011-05-07 DIAGNOSIS — I10 Essential (primary) hypertension: Secondary | ICD-10-CM | POA: Insufficient documentation

## 2011-05-07 DIAGNOSIS — C50919 Malignant neoplasm of unspecified site of unspecified female breast: Secondary | ICD-10-CM

## 2011-05-07 DIAGNOSIS — E039 Hypothyroidism, unspecified: Secondary | ICD-10-CM | POA: Insufficient documentation

## 2011-05-07 LAB — CBC
MCHC: 31.2 g/dL (ref 30.0–36.0)
MCV: 88.2 fL (ref 78.0–100.0)
Platelets: 192 10*3/uL (ref 150–400)
RDW: 14.2 % (ref 11.5–15.5)
WBC: 4.4 10*3/uL (ref 4.0–10.5)

## 2011-05-07 LAB — COMPREHENSIVE METABOLIC PANEL
AST: 15 U/L (ref 0–37)
Alkaline Phosphatase: 63 U/L (ref 39–117)
BUN: 14 mg/dL (ref 6–23)
Creatinine, Ser: 0.54 mg/dL (ref 0.4–1.2)
GFR calc Af Amer: >60 mL/min (ref >60)
GFR calc non Af Amer: >60 mL/min (ref >60)
Glucose, Bld: 89 mg/dL (ref 70–99)
Sodium: 140 mEq/L (ref 135–145)
Total Bilirubin: 0.3 mg/dL (ref 0.3–1.2)

## 2011-05-07 LAB — DIFFERENTIAL
Basophils Absolute: 0 10*3/uL (ref 0.0–0.1)
Basophils Relative: 0 % (ref 0–1)
Eosinophils Absolute: 0 10*3/uL (ref 0.0–0.7)
Eosinophils Relative: 1 % (ref 0–5)
Lymphocytes Relative: 22 % (ref 12–46)
Monocytes Absolute: 0.4 10*3/uL (ref 0.1–1.0)

## 2011-05-07 NOTE — Op Note (Signed)
NAMEBRIGITTE, [name]                ACCOUNT NO.:  [id number]   MEDICAL RECORD NO.:  [id number]          PATIENT TYPE:  AMB   LOCATION:  SDS                          FACILITY:  [facility]   PHYSICIAN:  Rose Phi. Maple Hudson, M.D.   DATE OF BIRTH:  1941/04/23   DATE OF PROCEDURE:  03/20/2007  DATE OF DISCHARGE:                               OPERATIVE REPORT   PREOPERATIVE DIAGNOSIS:  Stage I carcinoma of the right breast.   POSTOPERATIVE DIAGNOSIS:  Stage I carcinoma of the right breast.   OPERATION:  1. Blue dye injection.  2. Right partial mastectomy with needle localization and specimen      mammogram.  3. Right sentinel lymph node biopsy.   SURGEON:  Rose Phi. Maple Hudson, M.D.   ANESTHESIA:  General.   OPERATIVE PROCEDURE:  Prior to coming to the operating room, a  localizing wire been placed for the lesion at about the 12 o'clock  position of the right breast.  In addition 1 mCi of technetium sulfur  colloid was injected intradermally in the right breast.   After suitable general anesthesia was induced, the patient was placed in  the supine position where the arm was extended on the arm board.  5 mL  of a mixture of 2 mL of methylene blue and 3 mL of injectable saline was  injected in the subareolar tissue of the right breast and the breast  gently massaged for three minutes.  We then prepped and draped the  breast and axilla.   A curved incision centered at about the 12 o'clock position using the  wire as a guide was then outlined with a marking pencil.  Incision was  made and the wire delivered into the incision and then a wide excision  of the wire and surrounding tissue was carried out.  Specimen was  oriented for the pathologist.  It was submitted to the radiologist for  specimen mammogram and then to the pathologist for evaluation of the  margins.   While that was all being done, a short transverse right axillary  incision was made with dissection through the subcutaneous tissue to  the  clavipectoral fascia.  Deep to the fascia were two blue and hot nodes  which were removed.  There were no other blue hot or palpable nodes.  These were then submitted to the pathologist as sentinel nodes for  evaluation for metastatic disease.   Specimen mammogram confirmed the removal of the lesion.  Pathologic  evaluation of sentinel nodes was negative for metastatic disease and our  margins were clean.   With good hemostasis, both incisions were injected with 0.25% Marcaine.  Both incisions were then closed with 3-0 Vicryl and subcuticular 4-0  Monocryl with Dermabond.   Light dressings were applied and the patient transferred to the recovery  room in satisfactory condition having tolerated procedure well.      Rose Phi. Maple Hudson, M.D.  Electronically Signed     PRY/MEDQ  D:  03/20/2007  T:  03/20/2007  Job:  [id number]

## 2011-05-07 NOTE — Procedures (Signed)
NAMEMYCHAEL, SOOTS                ACCOUNT NO.:  [id number]   MEDICAL RECORD NO.:  [id number]          PATIENT TYPE:  OUT   LOCATION:  RAD                           FACILITY:  [facility]   PHYSICIAN:  Vida Roller, M.D.   DATE OF BIRTH:  08/03/1941   DATE OF PROCEDURE:  04/13/2005  DATE OF DISCHARGE:                                  ECHOCARDIOGRAM   PRIMARY CARE PHYSICIAN:  Dr. Juanetta Gosling.   TAPE NUMBER:  [id number].   TAPE COUNT:  [id number].   This is a 70 year old woman with hypertension for LV systolic function.  No  previous echoes.   TECHNICAL QUALITY OF THE STUDY:  Adequate.   M-MODE TRACINGS:  The aorta is 33 mm.   Left atrium is 34 mm.   The septum is 12 mm.   The posterior wall is 12 mm.   Left ventricular diastolic dimension is 28 mm.   Left ventricular systolic dimension is 20 mm.   2-D AND DOPPLER IMAGING:  The left ventricle is small with mild concentric  left ventricular hypertrophy.  There is vigorous LV systolic function with  an ejection fraction estimated at 75-80%.  There are no obvious wall motion  abnormalities.   The right ventricle is also small with normal systolic function.   Both the atria appear to be normal size.   The aortic valve is sclerotic with no stenosis or regurgitation.   The mitral valve has trace regurgitation and mild thickening.  No stenosis  is seen.   The tricuspid valve has trace regurgitation.   Pulmonic valve not well seen.   There is no pericardial effusion.   The inferior vena cava was not well seen.   The ascending aorta was not well seen.      JH/MEDQ  D:  04/13/2005  T:  04/13/2005  Job:  [id number]   cc:   Ramon Dredge L. Juanetta Gosling, M.D.  [address]  [place]  [place] [address]  Fax: [phone]

## 2011-05-07 NOTE — H&P (Signed)
NAME:  [name], [name]                ACCOUNT NO.:  [id number]   MEDICAL RECORD NO.:  [id number]          PATIENT TYPE:  INP   LOCATION:  A316                          FACILITY:  [facility]   PHYSICIAN:  Tesfaye D. Felecia Shelling, MD   DATE OF BIRTH:  06/19/1941   DATE OF ADMISSION:  04/27/2006  DATE OF DISCHARGE:  LH                                HISTORY & PHYSICAL   CHIEF COMPLAINT:  Left-sided numbness and weakness.   HISTORY OF PRESENT ILLNESS:  This is a 70 year old female, patient of Dr.  Ramon Dredge L. Juanetta Gosling, with a history of cerebral palsy and bronchial asthma.  Presented to the emergency room with the above complaints.  The patient  claims she had noticed numbness of her right side including her face.  She  also later developed mild weakness on the left side.  The patient had  difficulty in walking and balancing.  She was brought to the emergency room  and was evaluated.  Had a CT scan of the head that showed early sign of  stroke.  The patient was then admitted for further evaluation.   REVIEW OF SYSTEMS:  No headache, cough, fever, chest pain, shortness of  breath, nausea, vomiting, abdominal pain, dysuria, urgency, frequency or  urination.   PAST MEDICAL HISTORY:  1.  Cerebral palsy.  2.  Bronchial asthma.  3.  Osteoarthritis.   CURRENT MEDICATIONS:  1.  Singulair 10 mg p.o. daily.  2.  Xanax 0.5 mg t.i.d.  3.  Celebrex 200 mg daily.   SOCIAL HISTORY:  The patient is single.  She lives alone by herself.  No  history of alcohol, tobacco or substance abuse.   PHYSICAL EXAMINATION:  GENERAL:  The patient is alert, awake and responding  to verbal communication.  VITAL SIGNS:  Blood pressure 100 to 200/82, pulse 81, respiratory rate 22,  temperature 97.0 degrees.  HEENT:  Pupils are equal, round and reactive.  NECK:  Supple.  CHEST:  Clear in lung fields.  Good air entry.  CARDIOVASCULAR:  No murmur, no gallop.  ABDOMEN:  Obese, soft.  No active bowel sounds.  No masses or  organomegaly.  EXTREMITIES:  No leg edema.  NEUROLOGICAL:  The patient is alert, awake and oriented to 2/3.  The patient  has a slight weakness on the left side.   LABORATORY DATA:  WBC 6.0, hemoglobin 14.0, hematocrit 42.1, platelets 214.  PT 13.0, INR 1.0, PTT 28.  Sodium 142, potassium 3.7, chloride 105, carbon  dioxide 30, glucose was 93, BUN 13, creatinine 0.5, calcium 9.1.   IMPRESSION:  1.  Cerebrovascular accident.  2.  History of cerebral palsy.  3.  Bronchial asthma.  4.  Osteoarthritis.   PLAN:  We will admit the patient and do a neuro check.  Start on Plavix and  aspirin.  We will do a MRI of the brain.  We will continue the patient on  her regular medications.      Tesfaye D. Felecia Shelling, MD  Electronically Signed     TDF/MEDQ  D:  04/27/2006  T:  04/27/2006  Job:  [id number] 

## 2011-05-07 NOTE — Op Note (Signed)
NAMECRYSTALINA, [name]                ACCOUNT NO.:  [id number]   MEDICAL RECORD NO.:  [id number]          PATIENT TYPE:  AMB   LOCATION:  DAY                          FACILITY:  [facility]   PHYSICIAN:  Charlynne Pander, D.D.S.DATE OF BIRTH:  05-May-1941   DATE OF PROCEDURE:  04/27/2007  DATE OF DISCHARGE:                               OPERATIVE REPORT   SURGEON:  Charlynne Pander, D.D.S.   PREOPERATIVE DIAGNOSES:  1. Right breast cancer with anticipated hormonal therapy.  2. Cerebral palsy.  3. Chronic periodontitis.  4. Multiple retained root segments.  5. Chronic apical periodontitis.  6. Hyperplastic maxillary right tuberosity.  7. Soft tissue lesion involving the maxillary left buccal mucosa.   POSTOPERATIVE DIAGNOSES:  1. Right breast cancer with anticipated hormonal therapy.  2. Cerebral palsy.  3. Chronic periodontitis.  4. Multiple retained root segments.  5. Chronic apical periodontitis.  6. Hyperplastic maxillary right tuberosity.  7. Soft tissue lesion involving the maxillary left buccal mucosa.   PROCEDURES:  1. Extraction remaining teeth (tooth numbers to 2, 3, 4, 11, 17, 18,      19, 22, 23, 24, 25, 26, 27, 28 and 29).  2. Four quadrants of alveoloplasty.  3. Maxillary right tuberosity reduction.  4. Excision of a 6 x 8 mm soft tissue lesion involving the left buccal      mucosa.   SURGEON:  Charlynne Pander, D.D.S.   ASSISTANT:  Elliot Dally (Sales executive).   ANESTHESIA:  General anesthesia via nasoendotracheal tube.   FLUIDS:  1100 mL lactated Ringer solution.   ESTIMATED BLOOD LOSS:  150 mL.   MEDICATIONS:  1. Ancef 1 gram IV prior to invasive dental procedures.  2. Local anesthesia with a total utilization of three carpules each      containing 36 mg Xylocaine with 0.018 mg of epinephrine as well as      two carpules each containing 9 mg of bupivacaine with 0.009 mg of      epinephrine.   SPECIMENS:  1. There were 15 teeth which were  discarded.  2. 6 x 8 mm soft tissue lesion involving the left buccal mucosa      submitted to pathology to rule out malignancy.  This most likely      represents a traumatic fibroma.   CULTURES:  Were none.   DRAINS:  Were none.   COMPLICATIONS:  Were none.   INDICATIONS:  The patient recently diagnosed with breast cancer,  undergoing hormonal chemotherapy.  The dental consultation was requested  to rule out dental infection which may affect the patient's systemic  health.  The patient was examined and treatment planned for multiple  extraction of remaining teeth with pre prosthetic surgery as indicated  along with removal of the soft tissue lesion as indicated.  This  treatment plan was formulated to decrease the risk complications  associated dental infection from an affecting the patient's systemic  health.   OPERATIVE FINDINGS:  The patient was examined operating room number 6.  The teeth were identified for extraction.  The patient is noted be  affected  by chronic periodontitis, multiple retained root segments,  chronic apical periodontitis, hyperplastic maxillary right tuberosity,  and the presence of soft tissue lesion involving the left buccal mucosa.  The aforementioned necessitated removal of all remaining teeth with  alveoplasty a pre prosthetic surgery as indicated along with removal of  the soft tissue lesion.   DESCRIPTION OF PROCEDURE:  The patient was brought to main operating  room number 6.  The patient was then placed in supine position operating  room table.  General anesthesia was induced per the anesthesia team with  a nasoendotracheal tube.  The patient was then prepped and draped in  usual manner for dental medicine procedure.  A time-out was performed  and the patient was identified and procedures verified.  The oral cavity  was thoroughly examined with findings as noted above.  A throat pack was  placed at this time.  The patient was then ready for the  dental medicine  procedure as follows:   Local anesthesia was administered sequentially with a total utilization  of three carpules each containing 36 mg of Xylocaine with 0.018 mg of  epinephrine as well as two carpules each containing 9 mg of bupivacaine  with 0.009 mg of epinephrine.   The maxillary quadrants were first approached.  The patient was  anesthetized utilizing infiltration with the Xylocaine with epinephrine.  A 15 blade incision was made from the maxillary right tuberosity and  extended to the mesial of number 7, a surgical flap was then carefully  reflected.  Appropriate amounts of buccal and interseptal bone was  removed around tooth numbers 2, 3 and 4 appropriately.  These teeth were  then subluxated.  Tooth numbers 2 and 3 were then removed with a 53 R  forceps without complications.  Tooth number 4 was then removed with a  150 forceps without complications.  Significant alveoloplasty was  performed utilizing rongeurs and bone file.  A maxillary right  tuberosity reduction was then achieved utilizing a 15 blade and multiple  soft tissue incisions to remove the redundant tissue.  Further  alveoloplasty was then performed utilizing rongeurs and bone file.  The  tissues were approximated and trimmed appropriately.  The surgical site  was then irrigated with copious amounts sterile saline x2.  Surgical  site was then closed from maxillary right tuberosity and extended to the  mesial of number 8 utilizing 3-0 chromic gut suture in a continuous  interrupted suture technique x one.   The maxillary left quadrant was then approached, 15 blade incision was  made from the distal of number 13 extended to the mesial of number 9, a  surgical flap was then carefully reflected.  Appropriate mounts of  buccal and interseptal bone was removed around tooth number 11.  The  tooth was then subluxated and removed with a 150 forceps without complications.  Alveoplasty was then performed  utilizing rongeurs and  bone file.  The tissues were approximated and trimmed appropriately.  The surgical site was then irrigated copious amounts sterile saline.  The surgical site was then closed from distal number 13 extended to the  mesial number 9 utilizing 3-0 chromic gut suture in a continuous  interrupted suture technique x1.  At this point time maxillary left  tuberosity was evaluated and it was determined that the maxillary left  tuberosity reduction would nt need to be achieved.   The mandibular quadrants were then approached.  The patient was given  bilateral inferior alveolar nerve blocks utilizing the  bupivacaine with  epinephrine.  Further infiltration was achieved utilizing the Xylocaine  with epinephrine.  15 blade incision was then made from distal number 17  extended to the mesial of number 19, surgical flaps then carefully  reflected.  The root segments associated tooth number 17, 18 and 90 were  then removed with a rongeur appropriately.  Alveoplasty was then  performed utilizing rongeurs and bone file.  The surgical site was  irrigated with copious amounts sterile saline.  Surgical site was then  closed utilizing 3-0 chromic gut suture in a continuous interrupted  suture technique from distal number 17 extended to the mesial of number  19.   This point time a 15 blade incision was made from distal of number 21  and extended to the distal of number 30 surgical flap was then carefully  reflected.  Appropriate amounts of buccal and interseptal bone was  removed around the remaining teeth appropriately.  These teeth were then  subluxated with a series of straight elevators.  Tooth numbers 22, 23,  24, 25, 26, 27 and 28 and 29 were then removed with a 151 forceps  without complications.  Alveoplasty was then performed utilizing  rongeurs and bone file.  The surgical site was then irrigated with  copious amounts of sterile saline.  The tissues were approximated and   trimmed appropriately.  The surgical site was then closed from area  number 21 to the mesial of number 24 utilizing 3-0 chromic gut suture in  a continuous interrupted suture technique x1.  The mandibular right  quadrant was then closed from the distal of number 30 and extended to  the mesial of number 25 utilizing 3-0 chromic gut suture in a continuous  interrupted suture technique x 1.  One additional interrupted suture was  then placed further close the surgical site.   At this point in time the soft tissue lesion associated with left buccal  mucosa was approached.  The area was infiltrated appropriately with  Xylocaine with epinephrine.  A 3-0 silk suture was then placed in the 6  x 8 mm soft tissue lesion.  The lesion was then excised utilizing an  elliptical 15 blade incision.  The soft tissue lesion was then removed  appropriately.  This was then submitted to pathology to rule out malignancy but most likely represented a traumatic fibroma.  The  maxillary left soft tissue was then irrigated with copious amounts  sterile saline.  The surgical site was then closed utilizing three  separate interrupted sutures and 4-0 chromic gut material.   This point time the entire mouth was irrigated with copious amounts  sterile saline.  The patient was examined for complications, seeing  none, the dental medicine procedure was deemed to be complete.  The  throat pack was removed at this time.  A series of 4x4 gauzes were then  placed in the mouth to aid hemostasis.  No airway was placed at the  request of the Anesthesia Team.  The patient was then given to the  anesthesia team for final disposition.  After appropriate amount of time  the patient was extubated to the post anesthesia care unit with stable  vital signs good oxygenation level.  All counts were correct for dental  medicine procedure.  The patient will be followed up appropriately in  approximately 1 week for evaluation for suture  removal and definitive  discussion of the pathology results.      Charlynne Pander, D.D.S.  Electronically  Signed     RFK/MEDQ  D:  04/27/2007  T:  04/27/2007  Job:  [id number]   cc:   Ladona Horns. Mariel Sleet, MD  Fax: [phone]

## 2011-05-07 NOTE — Group Therapy Note (Signed)
[name], [name]                ACCOUNT NO.:  [id number]   MEDICAL RECORD NO.:  [id number]          PATIENT TYPE:  INP   LOCATION:  A316                          FACILITY:  [facility]   PHYSICIAN:  Edward L. Juanetta Gosling, M.D.DATE OF BIRTH:  [birth year]   DATE OF PROCEDURE:  04/29/2006  DATE OF DISCHARGE:                                   PROGRESS NOTE   PROBLEM:  Stroke.   SUBJECTIVE:  [name] is doing well.  She is apparently back to baseline.  She has no new complaints.  She says she would like to go home.  Dr.  Susa Griffins help was appreciated.   OBJECTIVE:  Her physical examination today shows she is awake and alert.  Her she is able to move her left leg much better.  Her temperature is 98.2,  pulse 81, respirations 20, blood pressure 131/90, O2 saturation 94%.  Her  chest is clear.  Heart is regular.  Her leg is much improved.   ASSESSMENT:  She is better.   PLAN:  She is going to be discharged home today.  Please see discharge  summary for details.      Edward L. Juanetta Gosling, M.D.  Electronically Signed     ELH/MEDQ  D:  04/29/2006  T:  04/29/2006  Job:  [id number]

## 2011-05-07 NOTE — Consult Note (Signed)
[name], [name]                ACCOUNT NO.:  [id number]   MEDICAL RECORD NO.:  [id number]          PATIENT TYPE:  REC   LOCATION:  RDNC                         FACILITY:  [facility]   PHYSICIAN:  Charlynne Pander, D.D.S.DATE OF BIRTH:  1941-09-17   DATE OF CONSULTATION:  04/25/2007  DATE OF DISCHARGE:                                 CONSULTATION   REFERRING PHYSICIAN:  Dr. Ladona Horns. Neijstrom.   REASON FOR CONSULTATION:  [name] is a 70 year old female referred  by Dr. Glenford Peers for a dental consultation.  Patient with recent  breast cancer diagnosis and is currently under hormonal therapy with Dr.  Glenford Peers.  A dental consultation was requested to evaluate poor  dentition and to rule out dental infection that may affect the patient's  systemic health.   MEDICAL HISTORY:  1. Right breast cancer -- stage I -- T1c, N0, MX.      a.     Status post lumpectomy on March 20, 2007 with Dr. Francina Ames with sentinel node biopsy.  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  The sentinel nodes were negative.       Cancer is ER/PR-positive and HER2/neu-negative.      b.     Patient currently Aromasin therapy only.  2. Cerebral palsy.  3. History of asthma.  4. Hypothyroidism.  5. Hypertension.  6. History of CVA in 2007, currently on aspirin therapy.  7. Osteoarthritis -- the patient walks with a walker.  8. Status post hysterectomy in 1968.   ALLERGIES/ADVERSE DRUG REACTIONS:  1. PREDNISONE cause shortness of breath and anxiety reaction.   MEDICATIONS:  1. Synthroid 50 mcg daily.  2. Hyzaar 100/25 daily.  3. Alprazolam 0.5 mg four times daily as needed for anxiety.  4. Aspirin 81 mg daily.  5. Avelox 400 mg twice daily.  6. Aromasin 25 mg daily.  7. Xopenex inhalation therapy twice daily.   SOCIAL HISTORY:  The patient was previously married and now widowed.  The patient had 1 son.  The patient presents with her sister (Everline  Rice).   The patient is a nonsmoker and nondrinker.   FAMILY HISTORY:  Noncontributory.   REVIEW OF FUNCTIONAL ASSESSMENT:  The patient means independent for ADLs  at this time.  The patient walks with the aid of a walker for  ambulation.   REVIEW OF SYSTEMS:  This is reviewed with the patient and sister and is  included in the dental consultation record.   DENTAL HISTORY:   CHIEF COMPLAINT:  Dental consultation requested to evaluate and rule out  dental infection which may affect the patient's systemic health.   HISTORY OF PRESENT ILLNESS:  The patient was recently diagnosed with  right breast cancer.  The patient was evaluated by Dr. Mariel Sleet for  possible chemotherapy and other hormonal therapy.  During that time,  poor dentition noted and the patient was referred for evaluation to rule  out dental infection which may affect the patient's systemic health.   The patient currently denies  acute toothache, swellings or abscesses.  The patient was last seen in later 2007 by an oral surgeon for  extraction of remaining teeth.  The patient was to be scheduled for  these procedures, but did not do so due to scheduling difficulties.  The  patient knows that she needs a all her teeth taken out.  The patient  had not seen a dentist for a long time, which is probably greater than  20 years prior to that evaluation..   DENTAL EXAM:   GENERAL:  The patient is a well-developed, well-nourished female in no  acute distress.  VITAL SIGNS:  Blood pressure is 104/64, pulse rate 84, temperature is  90.1.  HEAD AND NECK:  There is no palpable lymphadenopathy.  There are no  acute TMJ symptoms.  INTRAORAL EXAM:  Patient with normal saliva.  There is no evidence of  abscess formation.  There is a slight ulceration noted in the left lower  floor of the mouth consistent with aphthous ulceration.  Will follow  appropriately.  The patient also has a traumatic fibroma involving the  left buccal mucosa.   DENTITION:  Patient with multiple missing teeth, numbers 1, 5, 6, 7, 8,  9, 10, 12, 13, 14, 15, 16, 20, 21, 30, 31 and 32.  There are multiple  root segments noted.  PERIODONTAL:  Patient with chronic advanced periodontal disease with  plaque and calculus accumulations, generalized gingival recession, tooth  mobility and moderate bone loss.  DENTAL CARIES:  There are rampant dental caries noted affecting the  remaining teeth.  ENDODONTIC:  The patient currently denies acute pulpitis symptoms.  There does appear to be several areas associated with periapical  pathology involving the remaining roots.  CROWN OR BRIDGE:  There are no crown or bridge restorations.  PROSTHODONTIC:  The patient denies the presence of dentures.  The  patient may not be a candidate for future denture fabrication.  OCCLUSION:  Patient with poor occlusal scheme secondary to multiple  missing teeth, supereruption and drifting of the unopposed teeth into  the edentulous areas, and significant excess of maxillary tuberosities.  The patient will need to be evaluated for pre-prosthetic surgery as  indicated.    DENTAL RADIOGRAPHS  A suboptimal panoramic x-ray was taken and supplemented with 7  periapical radiographs as best able to be taken due to the patient's  significant mobility of her head. (This is apparently related to the  cerebral palsy)  There are multiple missing teeth.  There are multiple retained root  segments.  There are areas of periapical pathology.  There is moderate  to severe bone loss.  There are dental caries noted.  There is a poor  occlusal scheme noted.   ASSESSMENT:  1. History of oral neglect.  2. Plaque and calculus accumulations.  3. Chronic periodontitis with bone loss.  4. Gingival recession.  5. Tooth mobility.  6. Multiple missing teeth.  7. Multiple retained root segments.  8. Multiple areas of periapical pathology. 9. Rampant dental caries involving the remaining teeth.   10.Supereruption and drifting of the unopposed teeth into the      edentulous areas.  11.Excessive maxillary tuberosities.  12.Poor occlusal scheme.  13.Poor prognosis for a successful upper or lower complete denture      fabrication.  14.Aphthous ulceration of LL floor of mouth.  15.Traumatic fibroma involving left buccal mucosa.   PLAN/RECOMMENDATION:  1. I discussed the risks, benefits and complications of various      treatment  options with the patient in relationship to her medical      and dental conditions.  We discussed various treatment options to      include no treatment, total and subtotal extractions with      alveoloplasty, pre-prosthetic surgery as indicated, periodontal      therapy, dental restorations, root canal therapy, crown or bridge      therapy, implant therapy, and replacing the missing teeth as      indicated.  The patient and sister currently wish to proceed with      extraction of all remaining teeth with alveoloplasty and pre-      prosthetic surgery as indicated in the operating room with general      anesthesia.  Patient is aware of the significant risks associated      with these procedures.  The patient understands the significant      complexity associated with upper and lower denture fabrication and      may not pursue a denture fabrication after the dental extractions.      We will review this with the patient in approximately 4-6 weeks      after extraction sites have healed.  2. I will discuss findings with Dr. Mariel Sleet and proceed with a      general anesthesia procedure this coming Thursday, Apr 27, 2007.      Charlynne Pander, D.D.S.  Electronically Signed     RFK/MEDQ  D:  04/25/2007  T:  04/25/2007  Job:  [id number]   cc:   Ladona Horns. Mariel Sleet, MD  Fax: [phone]

## 2011-05-07 NOTE — Discharge Summary (Signed)
[name], CAGLEY                ACCOUNT NO.:  [id number]   MEDICAL RECORD NO.:  [id number]          PATIENT TYPE:  OBV   LOCATION:  A204                          FACILITY:  [facility]   PHYSICIAN:  Edward L. Juanetta Gosling, M.D.DATE OF BIRTH:  07-Feb-1941   DATE OF ADMISSION:  06/23/2006  DATE OF DISCHARGE:  07/06/2007LH                                 DISCHARGE SUMMARY   FINAL DISCHARGE DIAGNOSES:  1.  Chest pain, myocardial infarction ruled out.  2.  Asthma/chronic obstructive pulmonary disease.  3.  Anxiety.  4.  Cerebral palsy.  5.  Poor dentition.   HISTORY:  Ms. Zuch was brought to the emergency room because of chest  discomfort.  This had been present for about some two to three hours.  She  had been having significant problems, asthma recently and had a lot of  difficulty with this.  She has been on steroids and became very anxious and  depressed because of the steroids, but seems to doing a little better now.  She has complained of no fever.  She did not have any radiation of the pain.  She did not have any diaphoresis and she did have any other associated  symptoms.  Her pain occurred at rest.  It was in the right side of her  chest.  She has not had a previous history of similar problem.   PAST MEDICAL HISTORY:  Positive for asthma/COPD, for cerebral palsy, for  anxiety and depression.  She has been having more problems with the asthma  recently as mentioned.  She is on Singulair.  She has recently been on  prednisone.   FAMILY HISTORY:  Positive for asthma.   SOCIAL HISTORY:  Shows that she is a nonsmoker.  She does not drink any  alcohol.  She lives at home alone.   REVIEW OF SYSTEMS:  Except as mentioned is negative.   PHYSICAL EXAM:  Shows a woman with a fairly obvious cerebral palsy who is in  no acute distress.  She is awake and alert.  She has no complaints now.  Blood pressure 120/70, pulse 70, respirations 18.  She is afebrile.  Her  pupils are reactive to light  and accommodation.  Her dentition is very poor  which has been a chronic problem.  She has been trying to get something done  about that for some time but has not managed to get it done yet.  Her chest  is clear.  Her heart is regular.  Her abdomen is soft without masses.  Extremities showed no edema and her CNS showed the changes of cerebral  palsy.   HOSPITAL COURSE:  She ruled out for myocardial infarction.  Because she did  not have a D-dimer in the emergency room, she had a D-dimer to make sure she  did not have a pulmonary embolism.  D-dimer was normal  and she was discharged home.  She will follow-up in my office and may need  cardiology follow-up simply to be sure that she does not have problems with  her heart but this appeared to be very  atypical pain.   Dictation ended at this point.      Edward L. Juanetta Gosling, M.D.  Electronically Signed     ELH/MEDQ  D:  06/26/2006  T:  06/26/2006  Job:  [id number]

## 2011-05-07 NOTE — Discharge Summary (Signed)
[name], [name]                ACCOUNT NO.:  [id number]   MEDICAL RECORD NO.:  [id number]          PATIENT TYPE:  INP   LOCATION:  A316                          FACILITY:  [facility]   PHYSICIAN:  Edward L. Juanetta Gosling, M.D.DATE OF BIRTH:  11/26/1941   DATE OF ADMISSION:  04/27/2006  DATE OF DISCHARGE:  LH                                 DISCHARGE SUMMARY   FINAL DISCHARGE DIAGNOSES:  1.  Stroke.  2.  Cerebral palsy.  3.  Asthma.  4.  Anxiety.  5.  Poor dentition.   [name] is a 70 year old who has a history of cerebral palsy in the past  and also has asthma.  For several days she had noticed some weakness on the  left side.  She eventually thought it got bad enough that she should come to  the emergency room. She did, and when she came to the emergency room, her CT  showed a lot of motion artifact but did appear to show probably a middle  cerebral artery distribution stroke.  She is having difficulty with moving  her left leg more than her arm.  She has been on Singulair 10 mg daily,  Xanax 0.5 3 times a day, and Celebrex 200 mg daily.   PHYSICAL EXAMINATION:  Shows that she is awake and alert. Blood pressure  initially 200/82, pulse 81, respirations 22, temperature 97.  She had some  weakness on the left side.  She has some writhing motion is a chronic  finding.  Lab work in essence normal.   HOSPITAL COURSE:  She was started on Plavix and aspirin.  Scheduled for an  MRI, but because of her motion, she was unable to undergo that. We attempted  to give her some sedation, but it did not help a great deal, and she was  just not able to have an MRI.  Consultation was obtained with Dr. Gerilyn Pilgrim  and with physical therapy.  She was able to ambulate very well.  Dr.  Gerilyn Pilgrim felt that she had a small stroke, suggested discontinue Celebrex,  have her take aspirin and Plavix for a month, and then discontinue the  Plavix.  She will have home health services and then follow up in my  office.      Edward L. Juanetta Gosling, M.D.  Electronically Signed     ELH/MEDQ  D:  04/29/2006  T:  04/29/2006  Job:  [id number]

## 2011-05-07 NOTE — Group Therapy Note (Signed)
[name], [name]                ACCOUNT NO.:  [id number]   MEDICAL RECORD NO.:  [id number]          PATIENT TYPE:  INP   LOCATION:  A316                          FACILITY:  [facility]   PHYSICIAN:  Edward L. Juanetta Gosling, M.D.DATE OF BIRTH:  01/09/1941   DATE OF PROCEDURE:  04/28/2006  DATE OF DISCHARGE:                                   PROGRESS NOTE   SUBJECTIVE:  [name] was admitted last night with what appears to be a  stroke.  She has numbness of her left leg.  This actually started about 3  days ago.  CT showed what may be an infarction.  She is set for MRI today.  She says that she is nervous, but otherwise doing okay.   OBJECTIVE:  Her physical examination shows that her temperature is 97, pulse  83, respirations 20, blood pressure 133/94.  She does have some agitation  and gyrations, which she has had in the past because of her cerebral palsy,  but she seems more agitated today.  Her exam otherwise, I cannot tell much  about her leg because of her agitation.  Her chest is clear, her heart is  regular, her abdomen soft and she has very poor dentition.   ASSESSMENT:  She has probably had a stroke.   PLAN:  Plan is for her to have an MR of the brain today.  We are going to  try to get her set up with the neurologist.  Continue her treatments.  I  will go ahead and ask PT to see her and she has got a carotid ultrasound  ordered also.      Edward L. Juanetta Gosling, M.D.  Electronically Signed     ELH/MEDQ  D:  04/28/2006  T:  04/29/2006  Job:  [id number]

## 2011-05-07 NOTE — Procedures (Signed)
NAMEJAZMA, [name]                ACCOUNT NO.:  [id number]   MEDICAL RECORD NO.:  [id number]          PATIENT TYPE:  OUT   LOCATION:  RAD                           FACILITY:  [facility]   PHYSICIAN:  Vida Roller, M.D.   DATE OF BIRTH:  06-25-1941   DATE OF PROCEDURE:  DATE OF DISCHARGE:                                  ECHOCARDIOGRAM   PRIMARY CARE PHYSICIAN:  Dr. Juanetta Gosling.   HISTORY OF PRESENT ILLNESS:  [name] is a 70 year old woman with cerebral  palsy who has no known coronary artery disease but has some discomfort in  her chest.  She had a baseline echocardiogram performed in April, 2006,  which showed normal ejection fraction, actually vigorous LV systolic  function, normal right heart, and no significant valvular heart disease.  This is an evaluation for ischemia.   DESCRIPTION OF PROCEDURE:  Patient has difficulty with her severe cerebral  palsy; therefore, the feeling was that neither heart catheterization nor  perfusion scanning would be adequate to assess for coronary ischemia but  that dobutamine stress echo potentially could be, so she underwent an  infusion of dobutamine and rest images, low-dose, high-dose, and post-stress  images were obtained in the apical 4, apical 2, parasternal long, and  parasternal short axis views.  These views were then compared with those at  stress, and there was continuous electrocardiographic monitoring and blood  pressure monitoring that occurred over the period of time that she was  evaluated.   RESULTS:  Patient's heart rate increased from 80 beats per minute to 138  beats per minute, which is 80% of her maximum predicted heart rate for her  age.  Her blood pressure went from 140/88 to 157/88.  She received a total  of 40 mcg/kg/min of dobutamine to obtain her maximum predicted heart rate.  She did not require any atropine.  She denied any chest pain or shortness of  breath during the infusion.  She had an occasional PVC but no  ischemic ST/T  wave changes.   ECHOCARDIOGRAPHIC IMAGES:  At baseline, the patient has vigorous left  ventricular systolic function with normal left ventricular size.  At low-  dose dobutamine, her left ventricular cavity gets even further compressed.  There is vigorous systolic function with some augmentation of systolic  function and no evidence of wall motion abnormalities.  At high dose, she  has significantly increased anotropia but no obvious evident increase in  anotropia but her left ventricular systolic function remains normal.  There  are no wall motion abnormalities that are seen.   Post-infusion electrocardiographic images revealed a vigorous systolic  function with no obvious wall motion abnormalities.   INTERPRETATION:  This is a low-risk scan in a patient with no known coronary  artery disease.  Clinical correlation is advised.      JH/MEDQ  D:  05/10/2005  T:  05/10/2005  Job:  [id number]

## 2011-05-07 NOTE — Group Therapy Note (Signed)
[name], [name]                ACCOUNT NO.:  [id number]   MEDICAL RECORD NO.:  [id number]          PATIENT TYPE:  INP   LOCATION:  A204                          FACILITY:  [facility]   PHYSICIAN:  Edward L. Juanetta Gosling, M.D.DATE OF BIRTH:  1941-07-29   DATE OF PROCEDURE:  06/24/2006  DATE OF DISCHARGE:                                   PROGRESS NOTE   [name] was admitted yesterday with chest pain.  She says she has not had  any more.  Pain was right-sided.  Cardiac enzymes are negative, point of  care and cardiac panel.  EKG shows no signs of ischemia so I think it is  very unlikely that she has had any sort of a cardiac event.  However, I am  concerned that with her being short of breath and having complaint of asthma  that we need to at least make sure that her D-dimer is normal.  If it is I  will send her home.  If it is not then she will need a CT chest.      Edward L. Juanetta Gosling, M.D.  Electronically Signed     ELH/MEDQ  D:  06/24/2006  T:  06/24/2006  Job:  [id number]

## 2011-05-07 NOTE — Procedures (Signed)
NAMEANAHY, [name]                ACCOUNT NO.:  [id number]   MEDICAL RECORD NO.:  [id number]          PATIENT TYPE:  OUT   LOCATION:  RAD                           FACILITY:  [facility]   PHYSICIAN:  Vida Roller, M.D.   DATE OF BIRTH:  05-Apr-1941   DATE OF PROCEDURE:  05/10/2005  DATE OF DISCHARGE:                                    STRESS TEST   PROCEDURE:  Dobutamine echocardiogram.   CARDIOLOGIST:  Vida Roller, M.D.   INDICATIONS FOR PROCEDURE:  [name] is a 70 year old female with cerebral  palsy, with no known coronary artery disease, with complaints of chest pain.   BASELINE DATA:  Electrocardiogram reveals a sinus rhythm at 90 beats per  minute.  Nonspecific ST abnormalities.  The blood pressure is 140/88.   DESCRIPTION OF PROCEDURE:  Dobutamine was infused up to 30 mcg, to reach  target heart rate.  The maximum heart rate achieved was 138 beats per  minute, which is 88% of predicted maximum.  The maximum blood pressure was  at rest.  The blood pressure actually decreased with Dobutamine infusion,  down to 110/68, and resolved back up to 130/72 in recovery.  The  electrocardiogram revealed no ischemic changes.  A few PVC's were noted.  The patient denied any chest discomfort or shortness of breath.   Echocardiographic images were obtained and are pending M.D. review.      AB/MEDQ  D:  05/10/2005  T:  05/10/2005  Job:  [id number]

## 2011-05-07 NOTE — Consult Note (Signed)
NAMEKINYA, MEINE                ACCOUNT NO.:  [id number]   MEDICAL RECORD NO.:  [id number]          PATIENT TYPE:  INP   LOCATION:  A316                          FACILITY:  [facility]   PHYSICIAN:  Kofi A. Gerilyn Pilgrim, M.D. DATE OF BIRTH:  February 22, 1941   DATE OF CONSULTATION:  04/29/2006  DATE OF DISCHARGE:                                   CONSULTATION   This is a 70 year old white female who has a baseline history of cerebral  palsy and bronchial asthma. She presented to the emergency room with the  acute onset of left sided numbness and some heaviness. Numbness seemed to be  the predominant complaint. There is also difficulty walking and balancing.  She does have some baseline neurological deficits from her cerebral palsy.  Family states that she has some dysarthria at baseline which apparently did  not change with the new symptoms. The patient did have initial CT scan which  showed some early signs of stroke and was therefore admitted. MRI was  attempted, but this was unsuccessful due to the patient's inability to  remain still. She has had significant improvement, now that she has been  admitted for a day.   PAST MEDICAL HISTORY:  1.  Cerebral palsy.  2.  Bronchial asthma.  3.  Osteoarthritis.   ADMISSION MEDICATIONS:  Singulair, Xanax, Celebrex. She has been on these  for over two years.   SOCIAL HISTORY:  She is single. She does have a son who is supportive. She  lives by herself. No alcohol, tobacco or illicit drug use.   REVIEW OF SYSTEMS:  As in history of present illness, otherwise unrevealing.   PHYSICAL EXAMINATION:  GENERAL:  Shows an overweight, pleasant lady in no  acute distress.  VITAL SIGNS:  Temperature 97.4, pulse 95, respirations 20, blood pressure  127/98.  HEENT:  Poor dentition. She seemed to have some orofacial dyskinesia. Family  stated this was her baseline. She has had this for all of her life. Neck is  supple. Head is normocephalic, atraumatic.  MENTATION:  She is awake and alert. She has marked dysarthria, but again,  the family stated this is her baseline. She does converse relatively  fluently, however, and is lucid and coherent. She is oriented x3 and follows  commands well.  NEUROLOGICAL:  Cranial nerves evaluation:  Pupils are equal, round and  reactive. Visual fields are intact. Extraocular movements are full. She does  appear to have some mild proptosis bilaterally. Facial muscle strength is  symmetric. Tongue is midline. Uvula is midline. Motor examination shows  pronator drift of the left upper extremity. The direct strength testing  shows 4+/5 weakness on the left side. Right side is normal. Reflexes are  preserved. Plantar reflexes are downgoing. Gait is somewhat unsteady and  wide. Coordination shows some mild choreiform type movements of the left  upper extremity. No dysmetria noted.   Head CT scan is reviewed, and there appears to be increased hypodensity on  the right hemisphere suggestive of infarct with some mild evidence of edema.   ASSESSMENT:  1.  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  2.  Cerebral palsy with baseline neurological deficits.   RECOMMENDATIONS:  1.  She should continue on the aspirin and Plavix combination for a month.      Thereafter, she should only be on the single anti-platelet agent as the      multi-platelet agents increase risk of bleeding without significant      benefit.  2.  I will discontinue Celebrex and other anti-inflammatory medications. She      may want to use Tylenol or low-potency opiate such as Darvocet or      Ultram.  3.  Carotid Dopplers, if not ordered.   Thanks for this consultation.      Kofi A. Gerilyn Pilgrim, M.D.  Electronically Signed     KAD/MEDQ  D:  04/29/2006  T:  04/29/2006  Job:  [id number]

## 2011-05-28 ENCOUNTER — Emergency Department (HOSPITAL_COMMUNITY): Payer: Medicare Other

## 2011-05-28 ENCOUNTER — Emergency Department (HOSPITAL_COMMUNITY)
Admission: EM | Admit: 2011-05-28 | Discharge: 2011-05-28 | Disposition: A | Discharge status: Home / Self Care | Payer: Medicare Other | Attending: Emergency Medicine | Admitting: Emergency Medicine

## 2011-05-28 DIAGNOSIS — R5383 Other fatigue: Secondary | ICD-10-CM | POA: Insufficient documentation

## 2011-05-28 DIAGNOSIS — G809 Cerebral palsy, unspecified: Secondary | ICD-10-CM | POA: Insufficient documentation

## 2011-05-28 DIAGNOSIS — N390 Urinary tract infection, site not specified: Secondary | ICD-10-CM | POA: Insufficient documentation

## 2011-05-28 DIAGNOSIS — R5381 Other malaise: Secondary | ICD-10-CM | POA: Insufficient documentation

## 2011-05-28 DIAGNOSIS — R069 Unspecified abnormalities of breathing: Secondary | ICD-10-CM | POA: Insufficient documentation

## 2011-05-28 DIAGNOSIS — J45909 Unspecified asthma, uncomplicated: Secondary | ICD-10-CM | POA: Insufficient documentation

## 2011-05-28 DIAGNOSIS — Z853 Personal history of malignant neoplasm of breast: Secondary | ICD-10-CM | POA: Insufficient documentation

## 2011-05-28 DIAGNOSIS — I10 Essential (primary) hypertension: Secondary | ICD-10-CM | POA: Insufficient documentation

## 2011-05-28 LAB — URINALYSIS, ROUTINE W REFLEX MICROSCOPIC
Bilirubin Urine: NEGATIVE
Glucose, UA: NEGATIVE mg/dL
Ketones, ur: NEGATIVE mg/dL
Protein, ur: NEGATIVE mg/dL
Urobilinogen, UA: 0.2 mg/dL (ref 0.0–1.0)

## 2011-05-28 LAB — CBC
HCT: 42.5 % (ref 36.0–46.0)
MCHC: 31.1 g/dL (ref 30.0–36.0)
Platelets: 192 10*3/uL (ref 150–400)
RDW: 14.3 % (ref 11.5–15.5)
WBC: 4.5 10*3/uL (ref 4.0–10.5)

## 2011-05-28 LAB — URINE MICROSCOPIC-ADD ON

## 2011-05-28 LAB — BASIC METABOLIC PANEL
CO2: 32 mEq/L (ref 19–32)
Calcium: 9.8 mg/dL (ref 8.4–10.5)
Creatinine, Ser: 0.51 mg/dL (ref 0.4–1.2)
GFR calc non Af Amer: >60 mL/min (ref >60)
Glucose, Bld: 99 mg/dL (ref 70–99)
Sodium: 137 mEq/L (ref 135–145)

## 2011-05-28 LAB — DIFFERENTIAL
Basophils Absolute: 0 10*3/uL (ref 0.0–0.1)
Basophils Relative: 0 % (ref 0–1)
Eosinophils Relative: 1 % (ref 0–5)
Lymphocytes Relative: 25 % (ref 12–46)

## 2011-05-28 LAB — CK TOTAL AND CKMB (NOT AT ARMC)
CK, MB: 4.7 ng/mL — ABNORMAL HIGH (ref 0.3–4.0)
Relative Index: 3.7 — ABNORMAL HIGH (ref 0.0–2.5)
Total CK: 128 U/L (ref 7–177)

## 2011-05-31 LAB — URINE CULTURE
Colony Count: >=100000
Culture  Setup Time: 201206082124

## 2011-07-07 ENCOUNTER — Encounter: Payer: Self-pay | Admitting: Orthopedic Surgery

## 2011-07-07 ENCOUNTER — Ambulatory Visit (HOSPITAL_COMMUNITY)
Admission: RE | Admit: 2011-07-07 | Discharge: 2011-07-07 | Disposition: A | Discharge status: Home / Self Care | Payer: Medicare Other | Source: Ambulatory Visit | Attending: Orthopedic Surgery | Admitting: Orthopedic Surgery

## 2011-07-07 ENCOUNTER — Telehealth: Payer: Self-pay | Admitting: Orthopedic Surgery

## 2011-07-07 ENCOUNTER — Ambulatory Visit (INDEPENDENT_AMBULATORY_CARE_PROVIDER_SITE_OTHER): Payer: Medicare Other | Admitting: Orthopedic Surgery

## 2011-07-07 DIAGNOSIS — M25569 Pain in unspecified knee: Secondary | ICD-10-CM | POA: Insufficient documentation

## 2011-07-07 DIAGNOSIS — M25069 Hemarthrosis, unspecified knee: Secondary | ICD-10-CM

## 2011-07-07 DIAGNOSIS — M25469 Effusion, unspecified knee: Secondary | ICD-10-CM | POA: Insufficient documentation

## 2011-07-07 NOTE — Telephone Encounter (Signed)
For Xray results, per order today to have at [facility], please contact for results: Patient's sister, Ms. [name], ph # [phone], cell # [phone]  Sister also asked, following the appointment today, about having an injection in patient's other knee (right). Advised we can schedule a separate appointment for this knee.  She will set up after left knee Xray and results.

## 2011-07-07 NOTE — Patient Instructions (Signed)
Sent to hospital for x-ray

## 2011-07-07 NOTE — Progress Notes (Signed)
  A 70 year old female who fell 2 weeks ago and injured her LEFT knee.  She complains of lateral severe sharp knee pain which is constant of 2 weeks' duration secondary to a fall worsened with standing associated with swelling painful range of motion  Review of systems musculoskeletal as stated neurologic normal.  Past Medical History  Diagnosis Date  . Cerebral palsy   . Asthma   . Seasonal allergies   . Thyroid disease   . HTN (hypertension)   . Anxiety   . Arthritis      General appearance is normal  The patient is alert and oriented x3  The patient's mood and affect are normal  Gait assessment: She previously ambulated with a walker but is now basically confined to a wheelchair area of her legs especially the LEFT knee seems to give out on her when she stands The cardiovascular exam reveals normal pulses and temperature without edema swelling.  The lymphatic system is negative for palpable lymph nodes  The sensory exam is normal.  There are no pathologic reflexes.  Balance is Not assessable  Exam of the LEFT knee Inspection There is a large joint effusion with lateral tenderness over the lateral tibial plateau and anterior tibial tubercle Range of motion She has full extension actively and has 125 of flexion actively Stability The collateral ligaments are stable in extension and the anterior and posterior cruciate ligaments are stable Strength Extension strength is normal Skin Warm dry and intact no rash lesions or ulcer  Because of her limited mobility we did not x-ray her here in Center for x-ray  Impression hemarthrosis, differential diagnosis tibial plateau fracture, cruciate ligament injury, peripheral meniscal tear, patella dislocation with osteochondral fragment  Plan: I aspirated the knee approximately 30 cc of blood.  We placed a brace him in a wraparound hinge knee brace.  I sent her for x-ray  We ordered home health to assess her gait and gait  training  I will call her with x-ray results and further treatment plan.  We advised to increase her tramadol 50 mg 2 tablets as needed q.4 hours for pain Addendum the patient did not have her medication list in the caregiver could not name her medications

## 2011-07-07 NOTE — Telephone Encounter (Signed)
Report of x-ray from July 07, 2011  LEFT knee 4 views possible loose body primarily osteoarthritis LEFT knee with small joint effusion  Patient will be treated with a hinged knee brace for sprained knee  Followup will be needed for 6 weeks for reassessment  Home health gait assessment and training has been ordered

## 2011-07-07 NOTE — Procedures (Signed)
Procedure aspiration LEFT knee  Consent  Time out  The knee was prepped with alcohol and ethyl chloride  18-gauge needle was introduced into the knee joint via lateral approach parapatellar  Aspiration obtained 30 cc of bloody fluid.  Pressure was held over the injection site and a Band-Aid was applied

## 2011-07-08 ENCOUNTER — Telehealth: Payer: Self-pay | Admitting: Orthopedic Surgery

## 2011-07-08 NOTE — Telephone Encounter (Signed)
Advised the patient of doctor's reply °

## 2011-07-08 NOTE — Telephone Encounter (Signed)
Until she comes back   not for bathing

## 2011-07-08 NOTE — Telephone Encounter (Signed)
How long is Tamekia to wear the brace and is she to wear it all the time, even when sleeping?  [phone]

## 2011-07-09 ENCOUNTER — Telehealth: Payer: Self-pay | Admitting: *Deleted

## 2011-07-09 NOTE — Telephone Encounter (Signed)
Said patient has a small knot where fluid was drawn out, I advised to use ice and take Tramadol for pain if needed, call us back if needed

## 2011-08-09 ENCOUNTER — Telehealth: Payer: Self-pay | Admitting: Orthopedic Surgery

## 2011-08-09 NOTE — Telephone Encounter (Signed)
[name] called for [employer].  Yenty's case worker told her that they (thinks it is [employer]) have not received an order for home health  Services.   [phone]

## 2011-08-09 NOTE — Telephone Encounter (Signed)
Chasity refaxed order to [facility]

## 2011-08-09 NOTE — Telephone Encounter (Signed)
refaxed order to [facility]

## 2011-08-17 ENCOUNTER — Other Ambulatory Visit (HOSPITAL_COMMUNITY): Payer: Self-pay | Admitting: Pulmonary Disease

## 2011-08-17 DIAGNOSIS — M79605 Pain in left leg: Secondary | ICD-10-CM

## 2011-08-18 ENCOUNTER — Ambulatory Visit (HOSPITAL_COMMUNITY)
Admission: RE | Admit: 2011-08-18 | Discharge: 2011-08-18 | Disposition: A | Discharge status: Home / Self Care | Payer: Medicare Other | Source: Ambulatory Visit | Attending: Pulmonary Disease | Admitting: Pulmonary Disease

## 2011-08-18 ENCOUNTER — Other Ambulatory Visit (HOSPITAL_COMMUNITY): Payer: Self-pay | Admitting: Pulmonary Disease

## 2011-08-18 DIAGNOSIS — M79605 Pain in left leg: Secondary | ICD-10-CM

## 2011-08-18 DIAGNOSIS — M79609 Pain in unspecified limb: Secondary | ICD-10-CM | POA: Insufficient documentation

## 2011-08-18 DIAGNOSIS — M7989 Other specified soft tissue disorders: Secondary | ICD-10-CM | POA: Insufficient documentation

## 2011-08-19 ENCOUNTER — Ambulatory Visit (INDEPENDENT_AMBULATORY_CARE_PROVIDER_SITE_OTHER): Payer: Medicare Other | Admitting: Orthopedic Surgery

## 2011-08-19 ENCOUNTER — Encounter: Payer: Self-pay | Admitting: Orthopedic Surgery

## 2011-08-19 DIAGNOSIS — S8392XA Sprain of unspecified site of left knee, initial encounter: Secondary | ICD-10-CM | POA: Insufficient documentation

## 2011-08-19 DIAGNOSIS — M1711 Unilateral primary osteoarthritis, right knee: Secondary | ICD-10-CM | POA: Insufficient documentation

## 2011-08-19 DIAGNOSIS — IMO0002 Reserved for concepts with insufficient information to code with codable children: Secondary | ICD-10-CM

## 2011-08-19 MED ORDER — METHYLPREDNISOLONE ACETATE 40 MG/ML IJ SUSP: 40.0000 mg | Freq: Once | INTRAMUSCULAR | Status: DC

## 2011-08-19 NOTE — Progress Notes (Signed)
Follow up visit:  DX: KNEE PAIN/ HEMARTHROSIS  Plan: I aspirated the knee approximately 30 cc of blood. We placed a brace him in a wraparound hinge knee brace. I sent her for x-ray  We ordered home health to assess her gait and gait training    XRAY DONE AT HOSPIAL OR [facility] IMPRESSION:  Osteoarthritic changes left knee with small associated knee joint  effusion.   We advised to increase her tramadol 50 mg 2 tablets as needed q.4 hours for pain  Addendum the patient did not have her medication list in the caregiver could not name her medications.    The patient reports that she is much improved regarding her LEFT knee.  However her RIGHT knee is bothering her and she would like an injection.  She complains of pain popping and swelling in the RIGHT knee and some pain in the LEFT knee.  Exam shows flexion RIGHT knee 115.  Strength normal knee stable.  No effusion.  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 Time out was completed.The joint was prepped with alcohol, followed by  Ethyl chloride spray and A 20 gauge needle was inserted into the knee via lateral approach; 4ml 1% lidocaine and 1 ml of depomedrol  was then injected into the joint . The needle was removed and the area cleansed and dressed.  Complications:  None; patient tolerated the procedure well.

## 2011-08-19 NOTE — Patient Instructions (Addendum)
Call [facility]   Order physical therapy gait training 3-5 days a week x 30 days   DX left knee sprain

## 2011-08-24 ENCOUNTER — Telehealth: Payer: Self-pay | Admitting: Orthopedic Surgery

## 2011-08-24 NOTE — Telephone Encounter (Signed)
Called and gave verbal order to Clydie Braun for patient to have OT as well as PT

## 2011-08-24 NOTE — Telephone Encounter (Signed)
Done

## 2011-08-24 NOTE — Telephone Encounter (Signed)
[facility] nurse Clydie Braun called; states has done the initial evaluation and requests order for occupational therapy. Please advise.  Her cell phone is [phone].  States it can be verbal or written.  Fax # if written is [phone].

## 2011-10-04 ENCOUNTER — Other Ambulatory Visit (HOSPITAL_COMMUNITY): Payer: Self-pay | Admitting: Pulmonary Disease

## 2011-10-04 DIAGNOSIS — C50919 Malignant neoplasm of unspecified site of unspecified female breast: Secondary | ICD-10-CM

## 2011-11-03 ENCOUNTER — Ambulatory Visit (HOSPITAL_COMMUNITY): Payer: Medicare Other

## 2011-11-05 ENCOUNTER — Encounter (HOSPITAL_COMMUNITY): Payer: Self-pay | Admitting: Oncology

## 2011-11-05 ENCOUNTER — Encounter (HOSPITAL_COMMUNITY): Payer: Medicare Other | Attending: Oncology | Admitting: Oncology

## 2011-11-05 DIAGNOSIS — C50919 Malignant neoplasm of unspecified site of unspecified female breast: Secondary | ICD-10-CM

## 2011-11-05 DIAGNOSIS — J45909 Unspecified asthma, uncomplicated: Secondary | ICD-10-CM

## 2011-11-05 DIAGNOSIS — C50911 Malignant neoplasm of unspecified site of right female breast: Secondary | ICD-10-CM | POA: Insufficient documentation

## 2011-11-05 DIAGNOSIS — G809 Cerebral palsy, unspecified: Secondary | ICD-10-CM

## 2011-11-05 DIAGNOSIS — Z170 Estrogen receptor positive status [ER+]: Secondary | ICD-10-CM

## 2011-11-05 NOTE — Progress Notes (Signed)
CC:   Edward L. Juanetta Gosling, M.D. Ollen Gross. Vernell Morgans, M.D. Maryln Gottron, M.D.  DIAGNOSES: 1. 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  Estrogen receptor was     100%, progesterone receptor was 90%, HER-2/neu was 0, and her Ki-67     marker was low at 9%, HER-2 was negative as mentioned.  She is     presently on Aromasin 25 mg once a day.  She had her surgery on     03/20/2007 as far as the sentinel nodes were concerned.  She was     unable to take radiation due to her cerebral palsy and uncontrolled     movements, so we will continue Aromasin probably for 5 to 10 years. 2. Asthma x12 years, perhaps 13 presently, on recent antibiotic and     she is on a little bit of prednisone, she states. 3. Cerebral palsy since childhood. 4. Poor dental hygiene with her teeth removed by Dr. Dian Queen. 5. Obesity. 6. Hypothyroidism. 7. Hysterectomy in 1968. 8. Hypertension. 9. Cerebrovascular accident in 2007. 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 11.Fracture of right arm Easter 2001 with repair.  She does have an aid, but no one sleeps with her at night now.  She is trying to get by, but she states it is a little hard sometimes.  She is also a little constipated at times, using Metamucil, but she has run out of it, but her sister-in-law will get that for her.  She has got a little rash on both breasts.  It is really nonspecific, does not look like tumor, does not look like zoster, just almost looks like eczema.  She is going to see the dermatologist about that, plus a lesion on the upper back where it is slightly whitish centrally, but heaped up on the sides and perhaps with pearly borders and I worry that it might be a little basal cell, so she will get an appointment with Dr. Scharlene Gloss group.  Other than that, she has no lymphadenopathy.  Both breasts are  negative for masses.  She has clear lung fields at this time, decreased breath sounds at the bases, but that is not new.  Heart shows a regular rhythm and rate.  I did not hear an S3, gallop, or murmur.  Abdomen is obese without obvious masses.  She has no leg edema or arm edema.  So I did not do any blood work on her today since she states Dr. Juanetta Gosling is going to do some in January.  I will see her in 6 months, sooner if need be.    ______________________________ Ladona Horns. Mariel Sleet, MD ESN/MEDQ  D:  11/05/2011  T:  11/05/2011  Job:  [id number]

## 2011-11-05 NOTE — Progress Notes (Signed)
This office note has been dictated.

## 2011-11-05 NOTE — Patient Instructions (Signed)
[name]  [id number] [birth year]   [facility]  Discharge Instructions  RECOMMENDATIONS MADE BY THE CONSULTANT AND ANY TEST RESULTS WILL BE SENT TO YOUR REFERRING DOCTOR.   EXAM FINDINGS BY MD TODAY AND SIGNS AND SYMPTOMS TO REPORT TO CLINIC OR PRIMARY MD: Exam findings good per Dr. Mariel Sleet.    INSTRUCTIONS GIVEN AND DISCUSSED: Follow-up with Dr. Mariel Sleet in 6 months as discussed.  Please see attached schedule for date/time details.  I acknowledge that I have been informed and understand all the instructions given to me and received a copy. I do not have any more questions at this time, but understand that I may call the Specialty Clinic at [facility] at [phone] during business hours should I have any further questions or need assistance in obtaining follow-up care.    __________________________________________  _____________  __________ Signature of Patient or Authorized Representative            Date                   Time    __________________________________________ Nurse's Signature

## 2011-11-17 ENCOUNTER — Ambulatory Visit (HOSPITAL_COMMUNITY): Payer: Medicare Other

## 2011-11-24 ENCOUNTER — Ambulatory Visit (HOSPITAL_COMMUNITY)
Admission: RE | Admit: 2011-11-24 | Discharge: 2011-11-24 | Disposition: A | Discharge status: Home / Self Care | Payer: Medicare Other | Source: Ambulatory Visit | Attending: Pulmonary Disease | Admitting: Pulmonary Disease

## 2011-11-24 DIAGNOSIS — C50919 Malignant neoplasm of unspecified site of unspecified female breast: Secondary | ICD-10-CM

## 2011-11-24 DIAGNOSIS — Z853 Personal history of malignant neoplasm of breast: Secondary | ICD-10-CM | POA: Insufficient documentation

## 2012-01-27 ENCOUNTER — Ambulatory Visit (HOSPITAL_COMMUNITY)
Admission: RE | Admit: 2012-01-27 | Discharge: 2012-01-27 | Disposition: A | Discharge status: Home / Self Care | Payer: Medicare Other | Source: Ambulatory Visit | Attending: Pulmonary Disease | Admitting: Pulmonary Disease

## 2012-01-27 ENCOUNTER — Other Ambulatory Visit (HOSPITAL_COMMUNITY): Payer: Self-pay | Admitting: Pulmonary Disease

## 2012-01-27 DIAGNOSIS — M545 Low back pain, unspecified: Secondary | ICD-10-CM | POA: Insufficient documentation

## 2012-01-27 DIAGNOSIS — M25561 Pain in right knee: Secondary | ICD-10-CM

## 2012-01-27 DIAGNOSIS — M2340 Loose body in knee, unspecified knee: Secondary | ICD-10-CM | POA: Insufficient documentation

## 2012-01-27 DIAGNOSIS — M1710 Unilateral primary osteoarthritis, unspecified knee: Secondary | ICD-10-CM | POA: Insufficient documentation

## 2012-01-27 DIAGNOSIS — M25569 Pain in unspecified knee: Secondary | ICD-10-CM | POA: Insufficient documentation

## 2012-01-27 DIAGNOSIS — IMO0002 Reserved for concepts with insufficient information to code with codable children: Secondary | ICD-10-CM | POA: Insufficient documentation

## 2012-02-10 ENCOUNTER — Ambulatory Visit (INDEPENDENT_AMBULATORY_CARE_PROVIDER_SITE_OTHER): Payer: Medicare Other | Admitting: Orthopedic Surgery

## 2012-02-10 ENCOUNTER — Encounter: Payer: Self-pay | Admitting: Orthopedic Surgery

## 2012-02-10 DIAGNOSIS — M5136 Other intervertebral disc degeneration, lumbar region: Secondary | ICD-10-CM | POA: Insufficient documentation

## 2012-02-10 DIAGNOSIS — M5137 Other intervertebral disc degeneration, lumbosacral region: Secondary | ICD-10-CM

## 2012-02-10 DIAGNOSIS — M47817 Spondylosis without myelopathy or radiculopathy, lumbosacral region: Secondary | ICD-10-CM

## 2012-02-10 DIAGNOSIS — M5430 Sciatica, unspecified side: Secondary | ICD-10-CM | POA: Insufficient documentation

## 2012-02-10 DIAGNOSIS — M5106 Intervertebral disc disorders with myelopathy, lumbar region: Secondary | ICD-10-CM

## 2012-02-10 MED ORDER — PREDNISONE 10 MG PO KIT: 10.0000 mg | PACK | ORAL | Status: DC

## 2012-02-10 MED ORDER — GABAPENTIN 100 MG PO CAPS: 100.0000 mg | ORAL_CAPSULE | Freq: Three times a day (TID) | ORAL | Status: DC

## 2012-02-10 NOTE — Patient Instructions (Addendum)
You have been scheduled for an MRI scan.  Your insurance company requires advocate precertification prior to scheduling the MRI.  If her MRI scan is not improved we will let you know and make further treatment recommendations according to your insurance's guidelines.   We will call you with the results   Start new medications

## 2012-02-10 NOTE — Progress Notes (Signed)
  Subjective:    [name] is a 71 y.o. female who presents for evaluation RIGHT knee pain which has actually resolved but now complains of LEFT leg pain with severe 10 out of 10 radicular pain down the LEFT leg associated with no trauma.  The pain is constant throbbing stabbing burning associated with numbness and tingling down into the foot.  Review of systems she denies any red flag signs of cauda equina syndrome The following portions of the patient's history were reviewed and updated as appropriate: allergies, current medications, past family history, past medical history, past social history, past surgical history and problem list.  Review of Systems the numbness and tingling as stated she has no GI or GU and new symptoms or spinal related symptoms    Objective:    BP 100/72  Ht 5' (1.524 m)  Wt 216 lb (97.977 kg)  BMI 42.18 kg/m2  The patient is large but normal chromic and hygiene are noted.  She has a large body habitus.  She is oriented x3 despite her language barrier.  Her mood is depressed her affect is flat.  She is ambulating today in a wheelchair and has become wheelchair-bound primarily because of weakness and giving out of the LEFT lower extremity with severe pain  It was essentially difficult to examine her but we are examine her lower extremities and she has normal muscle tone no frank motor weakness her skin is intact over and tender bilaterally from venous stasis disease with edema.  Her hips knees and ankles are stable.  She is 115 of knee flexion on the LEFT and 120 on the RIGHT.  She has some crepitus but no severe pain with range of motion.  No joint effusion.  Again edema is noted in the lower extremities temperature is normal mild varicose veins are seen as well.  Decreased sensation lateral border of the foot lateral side of the lower leg.  This seated straight leg raise was positive.  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 Questions answered. MRI of the affected area due to presence of x-ray findings that show degenerative disc disease severe multiple levels and her symptoms of radicular pain.Marland Kitchen

## 2012-02-17 ENCOUNTER — Other Ambulatory Visit (HOSPITAL_COMMUNITY): Payer: Medicare Other

## 2012-02-25 ENCOUNTER — Other Ambulatory Visit: Payer: Self-pay | Admitting: *Deleted

## 2012-02-25 DIAGNOSIS — Z01818 Encounter for other preprocedural examination: Secondary | ICD-10-CM

## 2012-02-25 DIAGNOSIS — M5136 Other intervertebral disc degeneration, lumbar region: Secondary | ICD-10-CM

## 2012-02-25 DIAGNOSIS — M5137 Other intervertebral disc degeneration, lumbosacral region: Secondary | ICD-10-CM

## 2012-02-25 DIAGNOSIS — M47817 Spondylosis without myelopathy or radiculopathy, lumbosacral region: Secondary | ICD-10-CM

## 2012-02-25 DIAGNOSIS — Z01812 Encounter for preprocedural laboratory examination: Secondary | ICD-10-CM

## 2012-02-25 DIAGNOSIS — M5430 Sciatica, unspecified side: Secondary | ICD-10-CM

## 2012-02-29 ENCOUNTER — Telehealth: Payer: Self-pay | Admitting: Radiology

## 2012-02-29 NOTE — Telephone Encounter (Signed)
Patient has CT appointment at [facility] on 03-03-12 at 12:30. Patient has Medicare, no precert is needed. Dr. Romeo Apple will call with results.

## 2012-03-03 ENCOUNTER — Ambulatory Visit (HOSPITAL_COMMUNITY)
Admission: RE | Admit: 2012-03-03 | Discharge: 2012-03-03 | Disposition: A | Discharge status: Home / Self Care | Payer: Medicare Other | Source: Ambulatory Visit | Attending: Orthopedic Surgery | Admitting: Orthopedic Surgery

## 2012-03-03 DIAGNOSIS — M5430 Sciatica, unspecified side: Secondary | ICD-10-CM

## 2012-03-03 DIAGNOSIS — M5137 Other intervertebral disc degeneration, lumbosacral region: Secondary | ICD-10-CM

## 2012-03-03 DIAGNOSIS — M545 Low back pain, unspecified: Secondary | ICD-10-CM | POA: Insufficient documentation

## 2012-03-03 DIAGNOSIS — M47817 Spondylosis without myelopathy or radiculopathy, lumbosacral region: Secondary | ICD-10-CM

## 2012-03-03 DIAGNOSIS — M51379 Other intervertebral disc degeneration, lumbosacral region without mention of lumbar back pain or lower extremity pain: Secondary | ICD-10-CM | POA: Insufficient documentation

## 2012-03-06 ENCOUNTER — Telehealth: Payer: Self-pay | Admitting: Orthopedic Surgery

## 2012-03-06 ENCOUNTER — Emergency Department (HOSPITAL_COMMUNITY)
Admission: EM | Admit: 2012-03-06 | Discharge: 2012-03-06 | Disposition: A | Discharge status: Home / Self Care | Payer: Medicare Other | Attending: Emergency Medicine | Admitting: Emergency Medicine

## 2012-03-06 ENCOUNTER — Encounter (HOSPITAL_COMMUNITY): Payer: Self-pay

## 2012-03-06 ENCOUNTER — Emergency Department (HOSPITAL_COMMUNITY): Payer: Medicare Other

## 2012-03-06 DIAGNOSIS — M1710 Unilateral primary osteoarthritis, unspecified knee: Secondary | ICD-10-CM | POA: Insufficient documentation

## 2012-03-06 DIAGNOSIS — J45909 Unspecified asthma, uncomplicated: Secondary | ICD-10-CM | POA: Insufficient documentation

## 2012-03-06 DIAGNOSIS — M25569 Pain in unspecified knee: Secondary | ICD-10-CM | POA: Insufficient documentation

## 2012-03-06 DIAGNOSIS — I10 Essential (primary) hypertension: Secondary | ICD-10-CM | POA: Insufficient documentation

## 2012-03-06 DIAGNOSIS — W2203XA Walked into furniture, initial encounter: Secondary | ICD-10-CM | POA: Insufficient documentation

## 2012-03-06 DIAGNOSIS — M1712 Unilateral primary osteoarthritis, left knee: Secondary | ICD-10-CM

## 2012-03-06 DIAGNOSIS — G809 Cerebral palsy, unspecified: Secondary | ICD-10-CM | POA: Insufficient documentation

## 2012-03-06 MED ORDER — HYDROMORPHONE HCL PF 2 MG/ML IJ SOLN: 1.0000 mg | Freq: Once | INTRAMUSCULAR | Status: DC

## 2012-03-06 MED ORDER — HYDROMORPHONE HCL PF 1 MG/ML IJ SOLN
INTRAMUSCULAR | Status: AC
  Administered 2012-03-06: 1 mg
  Filled 2012-03-06: qty 1

## 2012-03-06 MED ORDER — ONDANSETRON 4 MG PO TBDP
4.0000 mg | ORAL_TABLET | Freq: Once | ORAL | Status: AC
  Administered 2012-03-06: 4 mg via ORAL
  Filled 2012-03-06: qty 1

## 2012-03-06 NOTE — ED Provider Notes (Signed)
Medical screening examination/treatment/procedure(s) were performed by non-physician practitioner and as supervising physician I was immediately available for consultation/collaboration.   Dione Booze, MD 03/06/12 1556

## 2012-03-06 NOTE — ED Provider Notes (Signed)
History     CSN: [id number]  Arrival date & time 03/06/12  0906   First MD Initiated Contact with Patient 03/06/12 [phone]      Chief Complaint  Patient presents with  . Knee Pain    (Consider location/radiation/quality/duration/timing/severity/associated sxs/prior treatment) HPI Comments: Pt was sitting in a wheelchair and struck L knee on the corner of a cabinet.  Pain since.  Worse with ambulation, movement and palpation.  No other injuries.  Patient is a 71 y.o. female presenting with knee pain. The history is provided by the patient and a relative. No language interpreter was used.  Knee Pain This is a new problem. Episode onset: 3 days ago. The problem occurs constantly. The problem has been unchanged. The symptoms are aggravated by walking and standing (palpation and movement.). Treatments tried: tramadol. The treatment provided no relief.    Past Medical History  Diagnosis Date  . Cerebral palsy   . Asthma   . Seasonal allergies   . Thyroid disease   . HTN (hypertension)   . Anxiety   . Arthritis   . Breast cancer   . Anginal pain     Past Surgical History  Procedure Date  . Breast lumpectomy   . Radical abdominal hysterectomy   . Dental extraction   . Right hand surgery     Family History  Problem Relation Age of Onset  . Cancer    . Diabetes    . Arthritis    . Asthma      History  Substance Use Topics  . Smoking status: Never Smoker   . Smokeless tobacco: Never Used  . Alcohol Use: No    OB History    Grav Para Term Preterm Abortions TAB SAB Ect Mult Living                  Review of Systems  Musculoskeletal:       Knee injury  All other systems reviewed and are negative.    Allergies  Naproxen  Home Medications   Current Outpatient Rx  Name Route Sig Dispense Refill  . ALBUTEROL SULFATE HFA 108 (90 BASE) MCG/ACT IN AERS Inhalation Inhale 2 puffs into the lungs every 6 (six) hours as needed. Shortness of breath    . ALPRAZOLAM 0.5  MG PO TABS Oral Take 0.25 mg by mouth every 4 (four) hours.      . ASPIRIN 325 MG PO TABS Oral Take 325 mg by mouth daily.      Marland Kitchen CALCIUM 600 + D PO Oral Take 1 tablet by mouth daily.      Marland Kitchen EXEMESTANE 25 MG PO TABS Oral Take 25 mg by mouth daily after breakfast.      . GABAPENTIN 100 MG PO CAPS Oral Take 1 capsule (100 mg total) by mouth 3 (three) times daily. 90 capsule 2  . LEVOTHYROXINE SODIUM 88 MCG PO TABS Oral Take 88 mcg by mouth daily.    Marland Kitchen LOSARTAN POTASSIUM-HCTZ 50-12.5 MG PO TABS Oral Take 1 tablet by mouth daily.      . TRAMADOL HCL 50 MG PO TABS Oral Take 100 mg by mouth every 6 (six) hours as needed. Maximum dose= 8 tablets per day pain    . TRIAMCINOLONE ACETONIDE 0.1 % EX CREA Topical Apply 1 application topically 2 (two) times daily as needed. Apply to affected areas    . MAGNESIUM SULFATE GRAN Oral Take by mouth as needed. If very constipated    . PREDNISONE  10 MG PO KIT Oral Take 1 kit (10 mg total) by mouth as directed. 1 kit 0    48    BP 111/58  Pulse 88  Temp(Src) 97.5 F (36.4 C) (Oral)  Resp 20  Wt 214 lb (97.07 kg)  SpO2 93%  Physical Exam  Nursing note and vitals reviewed. Constitutional: She is oriented to person, place, and time. She appears well-developed and well-nourished. No distress.  HENT:  Head: Normocephalic and atraumatic.  Eyes: EOM are normal.  Neck: Normal range of motion.  Cardiovascular: Normal rate, regular rhythm and normal heart sounds.   Pulmonary/Chest: Effort normal and breath sounds normal.  Abdominal: Soft. She exhibits no distension. There is no tenderness.  Musculoskeletal: She exhibits tenderness.       Left knee: She exhibits decreased range of motion, swelling, ecchymosis and bony tenderness. She exhibits no deformity, no laceration and no erythema. tenderness found.       Legs: Neurological: She is alert and oriented to person, place, and time.  Skin: Skin is warm and dry.  Psychiatric: She has a normal mood and affect.  Judgment normal.    ED Course  Procedures (including critical care time)  Labs Reviewed - No data to display Dg Knee Complete 4 Views Left  03/06/2012  *RADIOLOGY REPORT*  Clinical Data: Hit knee on cabinet, with knee pain  LEFT KNEE - COMPLETE 4+ VIEW  Comparison: Left knee films of 07/07/2011  Findings: The previously described tricompartmental degenerative joint disease has been relatively stable.  There is some loss of joint space, with spurring and sclerosis in all three compartments particularly within the patellofemoral compartment where there is almost complete loss of joint space.  A small knee joint effusion is noted.  Again there is question of a loose body in the posterior aspect of the joint space.  No fracture is seen.  IMPRESSION: No acute abnormality.  Tricompartmental degenerative joint disease with small left knee joint effusion.  Question loose body.  Original Report Authenticated By: Juline Patch, M.D.     1. Arthritis of left knee       MDM  knee sleeve Ice Ibuprofen with food.   F/u dr. Aviva Kluver Paul Half, PA 03/06/12 [address] [place], [place] 03/06/12 [phone]

## 2012-03-06 NOTE — ED Notes (Signed)
In wheelchair 2 days ago hit left knee on edge of table, cont. To have pain

## 2012-03-06 NOTE — Telephone Encounter (Signed)
x

## 2012-03-06 NOTE — Discharge Instructions (Signed)

## 2012-05-05 ENCOUNTER — Encounter (HOSPITAL_COMMUNITY): Payer: Self-pay | Admitting: Oncology

## 2012-05-05 ENCOUNTER — Encounter (HOSPITAL_COMMUNITY): Payer: Medicare Other | Attending: Oncology | Admitting: Oncology

## 2012-05-05 VITALS — BP 130/88 | HR 96 | Temp 98.4°F

## 2012-05-05 DIAGNOSIS — M8580 Other specified disorders of bone density and structure, unspecified site: Secondary | ICD-10-CM

## 2012-05-05 DIAGNOSIS — C50919 Malignant neoplasm of unspecified site of unspecified female breast: Secondary | ICD-10-CM

## 2012-05-05 DIAGNOSIS — G809 Cerebral palsy, unspecified: Secondary | ICD-10-CM

## 2012-05-05 DIAGNOSIS — Z170 Estrogen receptor positive status [ER+]: Secondary | ICD-10-CM

## 2012-05-05 NOTE — Progress Notes (Signed)
Tracy Maudlin, MD, MD [address] [place] [address]  1. Infiltrating ductal carcinoma of breast     CURRENT THERAPY: On Aromasin daily starting on 04/12/2007.  INTERVAL HISTORY: [name] 71 y.o. female returns for  regular  visit for followup of Stage I (T1c N0), grade 1 infiltrating ductal carcinoma of the right breast, well-differentiated, occurring at the 12 o'clock position with 2 negative sentinel nodes. Estrogen receptor was 100%, progesterone receptor was 90%, HER-2/neu was 0, and her Ki-67 marker was low at 9%, HER-2 was negative as mentioned. She is presently on Aromasin 25 mg once a day. She had her surgery on 03/20/2007 as far as the sentinel nodes were concerned. She was unable to take radiation due to her cerebral palsy and uncontrolled movements, so we will continue Aromasin probably for 5 to 10 years.    [name] is very pleased that she's completed 5 years Worth of her Aromasin therapy. She continues to have hot flashes and becomes very hot and sweating at times. So we spent some time discussing future Aromasin therapy. I've explained to her the risks associated with continuation of the Aromasin which includes increased risk for blood clots, endometrial cancer (she had a hysterectomy in the past), hot flashes, arthralgias, and myalgias. We've also discussed the benefits of continuing Aromasin which includes decreasing the risk of recurrence of her cancer.  She is willing to continue with her Aromasin for another 6 months. At that point time we can rediscuss future therapy with an aromatase inhibitor. She will contact us if she decides to quit taking the Aromasin. As mentioned above, she has completed 5 years Worth of therapy. We would like for her to continue this medication, but she is hesitant to take medication for an additional 5 more years.  The patient was able to make it to dermatologist for the worrisome findings to Dr. Mariel Sleet saw him last exam,  namely a lesion with potentially pearly borders worrisome for basal cell carcinoma. She reports this was removed and was benign.  He was also noted on her last encounter that she had an unspecific erythematous rash over the chest. This is not noted on today's exam.  The patient is seen by her primary care physician, Dr. Kari Baars. She reports that she has had blood work this year performed by him. As a result, we will not perform laboratory work today.  The patient's last bone density was 2 years ago. She was noted to be osteopenic at that time. We will perform another bone density examination within the next month. If her bone density is worse, I would be inclined to start Prolia which is an injection every 6 months which will correlate beautifully with her six-month followup appointment.  The patient does admit to some insomnia. She does have Xanax at home. She reports that she wakes up approximately 4 clock in the morning after falling asleep. She would like to sleep until 6 or 6:30 in the morning.  I suggested she try Benadryl or Tylenol PM.  She may take one tablet at bedtime. If this is effective, she should continue with one tablet at hour of sleep. If this is ineffective, I've asked her to increase to 2 tablets at hour of sleep. If this is ineffective, she should followup with her primary care physician. She may be a candidate for Intermezzo, but I will defer this to her primary care physician.  Oncologically, the patient denies any complaints. She does  admit to hot flashes associated with the Aromasin. Otherwise she is doing very well.  Past Medical History  Diagnosis Date  . Cerebral palsy   . Asthma   . Seasonal allergies   . Thyroid disease   . HTN (hypertension)   . Anxiety   . Arthritis   . Breast cancer   . Anginal pain     has CARPAL TUNNEL SYNDROME, BILATERAL; COMPLETE RUPTURE OF ROTATOR CUFF; CLOSED FRACTURE OF DISTAL END OF ULNA; DEGENERATIVE JOINT DISEASE, RIGHT KNEE;  BURSITIS, KNEE; Hemarthrosis involving knee joint; Sprain of left knee; Arthritis of knee, right; Infiltrating ductal carcinoma of breast; Sciatica; and DDD (degenerative disc disease), lumbar on her problem list.     is allergic to naproxen.  [name] had no medications administered during this visit.  Past Surgical History  Procedure Date  . Breast lumpectomy   . Radical abdominal hysterectomy   . Dental extraction   . Right hand surgery     Denies any headaches, dizziness, double vision, fevers, chills, night sweats, nausea, vomiting, diarrhea, constipation, chest pain, heart palpitations, shortness of breath, blood in stool, black tarry stool, urinary pain, urinary burning, urinary frequency, hematuria.   PHYSICAL EXAMINATION  ECOG PERFORMANCE STATUS: 3 - Symptomatic, >50% confined to bed  Filed Vitals:   05/05/12 1223  BP: 130/88  Pulse: 96  Temp: 98.4 F (36.9 C)    GENERAL:alert, no distress, well nourished, well developed, comfortable, cooperative, obese, smiling and deformities secondary to cerebral palsy. SKIN: skin color, texture, turgor are normal, no rashes or significant lesions HEAD: Normocephalic, No masses, lesions, tenderness or abnormalities EYES: normal, Conjunctiva are pink and non-injected EARS: External ears normal OROPHARYNX:lips, buccal mucosa, and tongue normal and mucous membranes are moist  NECK: supple, trachea midline LYMPH:  not examined due to patient positioning in wheelchair BREAST:breasts appear normal, no suspicious masses, no skin or nipple changes or axillary nodes LUNGS: expiratory wheezes bilaterally HEART: regular rate & rhythm, no murmurs, no gallops, S1 normal and S2 normal ABDOMEN:abdomen soft, non-tender, obese and normal bowel sounds BACK: Back symmetric, no curvature. EXTREMITIES:less then 2 second capillary refill, no joint deformities, effusion, or inflammation, no skin discoloration, no clubbing, no cyanosis, positive  findings:  edema right pedal edema 1+ without any erythema, heat, or pain.  She reports this is a chronic issue for her.   NEURO: Alert and oriented x 3, with slow speech, but accurate.   ASSESSMENT:  1. 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 Estrogen receptor was 100%, progesterone receptor was 90%, HER-2/neu was 0, and her Ki-67 marker was low at 9%, HER-2 was negative as mentioned. She is presently on Aromasin 25 mg once a day. She had her surgery on 03/20/2007 as far as the sentinel nodes were concerned. She was unable to take radiation due to her cerebral palsy and uncontrolled movements, so we will continue Aromasin probably for 5 to 10 years.  2. Asthma x12 years, perhaps 13 presently, on recent antibiotic and she is on a little bit of prednisone, she states.  3. Cerebral palsy since childhood.  4. Poor dental hygiene with her teeth removed by Dr. Dian Queen.  5. Obesity.  6. Hypothyroidism.  7. Hysterectomy in 1968.  8. Hypertension.  9. Cerebrovascular accident in 2007.  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  11.Fracture of right arm Easter 2001 with repair. 12. Osteopenia.  PLAN:  1. Bone density in 1 month or so.  If worse, would pursue Prolia every 6 months in light of her Aromatase inhibitor usage. 2. Lab work by PCP. 3. Continue Aromasin daily.  She will due this for another 6 months at which point in time we can re-visit this topic.  If she quits, whe will call the clinic to let us know so we may properly document.  4. Recommended Benadryl to help with insomnia.  If ineffective, recommended follow-up with PCP for management.  5. Discussed the risks, benefits, and alternative to continuation of Aromasin past 5 years.  She has agreed to take it for 6 more months.  6. Return in 6 months for follow-up.   All questions were answered. The  patient knows to call the clinic with any problems, questions or concerns. We can certainly see the patient much sooner if necessary.  Leinaala Catanese

## 2012-05-05 NOTE — Patient Instructions (Signed)
[facility]  Discharge Instructions [name]  [id number] 10-26-1941 Dr. Glenford Peers  RECOMMENDATIONS MADE BY THE CONSULTANT AND ANY TEST RESULTS WILL BE SENT TO YOUR REFERRING DOCTOR.   EXAM FINDINGS BY MD TODAY AND SIGNS AND SYMPTOMS TO REPORT TO CLINIC OR PRIMARY MD:  We would love for you to take the aromasin for 10 years. We will take it 6 months at a time If you do stop it call us and let us know   SPECIAL INSTRUCTIONS/FOLLOW-UP: [facility] for bone density within the month   I acknowledge that I have been informed and understand all the instructions given to me and received a copy. I do not have any more questions at this time, but understand that I may call the Specialty Clinic at [facility] at [phone] during business hours should I have any further questions or need assistance in obtaining follow-up care.    __________________________________________  _____________  __________ Signature of Patient or Authorized Representative            Date                   Time    __________________________________________ Nurse's Signature

## 2012-05-08 ENCOUNTER — Other Ambulatory Visit (HOSPITAL_COMMUNITY): Payer: Self-pay | Admitting: Oncology

## 2012-05-08 ENCOUNTER — Encounter (HOSPITAL_COMMUNITY): Payer: Self-pay | Admitting: Oncology

## 2012-05-08 ENCOUNTER — Ambulatory Visit (HOSPITAL_COMMUNITY)
Admission: RE | Admit: 2012-05-08 | Discharge: 2012-05-08 | Disposition: A | Discharge status: Home / Self Care | Payer: Medicare Other | Source: Ambulatory Visit | Attending: Oncology | Admitting: Oncology

## 2012-05-08 DIAGNOSIS — M810 Age-related osteoporosis without current pathological fracture: Secondary | ICD-10-CM | POA: Insufficient documentation

## 2012-05-08 DIAGNOSIS — C50919 Malignant neoplasm of unspecified site of unspecified female breast: Secondary | ICD-10-CM | POA: Insufficient documentation

## 2012-05-08 DIAGNOSIS — M8580 Other specified disorders of bone density and structure, unspecified site: Secondary | ICD-10-CM

## 2012-05-08 DIAGNOSIS — M818 Other osteoporosis without current pathological fracture: Secondary | ICD-10-CM | POA: Insufficient documentation

## 2012-05-08 HISTORY — DX: Age-related osteoporosis without current pathological fracture: M81.0

## 2012-05-09 ENCOUNTER — Other Ambulatory Visit (HOSPITAL_COMMUNITY): Payer: Self-pay | Admitting: Oncology

## 2012-05-09 DIAGNOSIS — C50919 Malignant neoplasm of unspecified site of unspecified female breast: Secondary | ICD-10-CM

## 2012-05-09 MED ORDER — EXEMESTANE 25 MG PO TABS: 25.0000 mg | ORAL_TABLET | Freq: Every day | ORAL | Status: DC

## 2012-05-12 ENCOUNTER — Encounter (HOSPITAL_BASED_OUTPATIENT_CLINIC_OR_DEPARTMENT_OTHER): Payer: Medicare Other

## 2012-05-12 DIAGNOSIS — M810 Age-related osteoporosis without current pathological fracture: Secondary | ICD-10-CM

## 2012-05-12 MED ORDER — DENOSUMAB 60 MG/ML ~~LOC~~ SOLN
60.0000 mg | Freq: Once | SUBCUTANEOUS | Status: AC
  Administered 2012-05-12: 60 mg via SUBCUTANEOUS
  Filled 2012-05-12: qty 1

## 2012-05-12 NOTE — Progress Notes (Signed)
[name] presents today for injection per MD orders. Prolia 60 mg administered SQ in right Abdomen. Administration without incident. Patient tolerated well.  

## 2012-05-23 ENCOUNTER — Other Ambulatory Visit (HOSPITAL_COMMUNITY): Payer: Self-pay | Admitting: Oncology

## 2012-06-01 ENCOUNTER — Encounter: Payer: Self-pay | Admitting: Oncology

## 2012-06-27 ENCOUNTER — Encounter (HOSPITAL_COMMUNITY): Payer: Self-pay | Admitting: Emergency Medicine

## 2012-06-27 ENCOUNTER — Emergency Department (HOSPITAL_COMMUNITY): Payer: Medicare Other

## 2012-06-27 ENCOUNTER — Inpatient Hospital Stay (HOSPITAL_COMMUNITY)
Admission: EM | Admit: 2012-06-27 | Discharge: 2012-06-29 | DRG: 690 | Disposition: A | Discharge status: Home Health | Payer: Medicare Other | Attending: Pulmonary Disease | Admitting: Pulmonary Disease

## 2012-06-27 DIAGNOSIS — M1710 Unilateral primary osteoarthritis, unspecified knee: Secondary | ICD-10-CM

## 2012-06-27 DIAGNOSIS — Z853 Personal history of malignant neoplasm of breast: Secondary | ICD-10-CM

## 2012-06-27 DIAGNOSIS — E876 Hypokalemia: Secondary | ICD-10-CM

## 2012-06-27 DIAGNOSIS — J449 Chronic obstructive pulmonary disease, unspecified: Secondary | ICD-10-CM | POA: Diagnosis present

## 2012-06-27 DIAGNOSIS — C50919 Malignant neoplasm of unspecified site of unspecified female breast: Secondary | ICD-10-CM

## 2012-06-27 DIAGNOSIS — M5136 Other intervertebral disc degeneration, lumbar region: Secondary | ICD-10-CM

## 2012-06-27 DIAGNOSIS — Z7982 Long term (current) use of aspirin: Secondary | ICD-10-CM

## 2012-06-27 DIAGNOSIS — N390 Urinary tract infection, site not specified: Principal | ICD-10-CM | POA: Diagnosis present

## 2012-06-27 DIAGNOSIS — G809 Cerebral palsy, unspecified: Secondary | ICD-10-CM | POA: Diagnosis present

## 2012-06-27 DIAGNOSIS — R531 Weakness: Secondary | ICD-10-CM

## 2012-06-27 DIAGNOSIS — Z90710 Acquired absence of both cervix and uterus: Secondary | ICD-10-CM

## 2012-06-27 DIAGNOSIS — J4489 Other specified chronic obstructive pulmonary disease: Secondary | ICD-10-CM | POA: Diagnosis present

## 2012-06-27 DIAGNOSIS — Z79899 Other long term (current) drug therapy: Secondary | ICD-10-CM

## 2012-06-27 DIAGNOSIS — I10 Essential (primary) hypertension: Secondary | ICD-10-CM | POA: Diagnosis present

## 2012-06-27 DIAGNOSIS — M5430 Sciatica, unspecified side: Secondary | ICD-10-CM

## 2012-06-27 DIAGNOSIS — F411 Generalized anxiety disorder: Secondary | ICD-10-CM | POA: Diagnosis present

## 2012-06-27 DIAGNOSIS — E0590 Thyrotoxicosis, unspecified without thyrotoxic crisis or storm: Secondary | ICD-10-CM | POA: Diagnosis present

## 2012-06-27 LAB — BASIC METABOLIC PANEL
CO2: 38 mEq/L — ABNORMAL HIGH (ref 19–32)
Calcium: 8.9 mg/dL (ref 8.4–10.5)
Glucose, Bld: 106 mg/dL — ABNORMAL HIGH (ref 70–99)
Potassium: 2.9 mEq/L — ABNORMAL LOW (ref 3.5–5.1)
Sodium: 139 mEq/L (ref 135–145)

## 2012-06-27 LAB — CBC WITH DIFFERENTIAL/PLATELET
Eosinophils Relative: 0 % (ref 0–5)
Hemoglobin: 13.2 g/dL (ref 12.0–15.0)
Lymphocytes Relative: 18 % (ref 12–46)
Lymphs Abs: 1 10*3/uL (ref 0.7–4.0)
MCV: 91.4 fL (ref 78.0–100.0)
Platelets: 139 10*3/uL — ABNORMAL LOW (ref 150–400)
RBC: 4.67 MIL/uL (ref 3.87–5.11)
WBC: 5.4 10*3/uL (ref 4.0–10.5)

## 2012-06-27 LAB — MRSA PCR SCREENING: MRSA by PCR: NEGATIVE

## 2012-06-27 MED ORDER — ALPRAZOLAM 0.25 MG PO TABS
0.2500 mg | ORAL_TABLET | ORAL | Status: DC
  Administered 2012-06-27 – 2012-06-29 (×10): 0.25 mg via ORAL
  Filled 2012-06-27 (×10): qty 1

## 2012-06-27 MED ORDER — EXEMESTANE 25 MG PO TABS
25.0000 mg | ORAL_TABLET | Freq: Every day | ORAL | Status: DC
  Administered 2012-06-28 – 2012-06-29 (×2): 25 mg via ORAL
  Filled 2012-06-27 (×3): qty 1

## 2012-06-27 MED ORDER — GABAPENTIN 100 MG PO CAPS
100.0000 mg | ORAL_CAPSULE | Freq: Three times a day (TID) | ORAL | Status: DC
  Administered 2012-06-27 – 2012-06-29 (×5): 100 mg via ORAL
  Filled 2012-06-27 (×5): qty 1

## 2012-06-27 MED ORDER — LEVOTHYROXINE SODIUM 88 MCG PO TABS
88.0000 ug | ORAL_TABLET | Freq: Every day | ORAL | Status: DC
  Administered 2012-06-28 – 2012-06-29 (×2): 88 ug via ORAL
  Filled 2012-06-27 (×4): qty 1

## 2012-06-27 MED ORDER — ACETAMINOPHEN 650 MG RE SUPP: 650.0000 mg | Freq: Four times a day (QID) | RECTAL | Status: DC | PRN

## 2012-06-27 MED ORDER — ASPIRIN 325 MG PO TABS
325.0000 mg | ORAL_TABLET | Freq: Every day | ORAL | Status: DC
  Administered 2012-06-28 – 2012-06-29 (×2): 325 mg via ORAL
  Filled 2012-06-27 (×2): qty 1

## 2012-06-27 MED ORDER — ACETAMINOPHEN 325 MG PO TABS
650.0000 mg | ORAL_TABLET | Freq: Four times a day (QID) | ORAL | Status: DC | PRN
  Administered 2012-06-28: 650 mg via ORAL
  Filled 2012-06-27: qty 2

## 2012-06-27 MED ORDER — LOSARTAN POTASSIUM-HCTZ 50-12.5 MG PO TABS: 1.0000 | ORAL_TABLET | Freq: Every day | ORAL | Status: DC

## 2012-06-27 MED ORDER — ALUM & MAG HYDROXIDE-SIMETH 200-200-20 MG/5ML PO SUSP: 30.0000 mL | Freq: Four times a day (QID) | ORAL | Status: DC | PRN

## 2012-06-27 MED ORDER — CLOPIDOGREL BISULFATE 75 MG PO TABS
75.0000 mg | ORAL_TABLET | Freq: Every day | ORAL | Status: DC
  Administered 2012-06-28: 75 mg via ORAL
  Filled 2012-06-27: qty 1

## 2012-06-27 MED ORDER — TRAMADOL HCL 50 MG PO TABS
100.0000 mg | ORAL_TABLET | Freq: Four times a day (QID) | ORAL | Status: DC | PRN
  Administered 2012-06-28: 100 mg via ORAL
  Filled 2012-06-27 (×2): qty 2

## 2012-06-27 MED ORDER — TRAZODONE HCL 50 MG PO TABS: 25.0000 mg | ORAL_TABLET | Freq: Every evening | ORAL | Status: DC | PRN

## 2012-06-27 MED ORDER — POTASSIUM CHLORIDE 20 MEQ PO PACK
40.0000 meq | PACK | Freq: Once | ORAL | Status: AC
  Administered 2012-06-27: 40 meq via ORAL
  Filled 2012-06-27: qty 2

## 2012-06-27 MED ORDER — ALBUTEROL SULFATE (5 MG/ML) 0.5% IN NEBU: 2.5000 mg | INHALATION_SOLUTION | RESPIRATORY_TRACT | Status: DC | PRN

## 2012-06-27 MED ORDER — SODIUM CHLORIDE 0.9 % IJ SOLN
3.0000 mL | Freq: Two times a day (BID) | INTRAMUSCULAR | Status: DC
  Administered 2012-06-27 – 2012-06-28 (×3): 3 mL via INTRAVENOUS
  Filled 2012-06-27 (×4): qty 3

## 2012-06-27 MED ORDER — LOSARTAN POTASSIUM 50 MG PO TABS
50.0000 mg | ORAL_TABLET | Freq: Every day | ORAL | Status: DC
  Administered 2012-06-28 – 2012-06-29 (×2): 50 mg via ORAL
  Filled 2012-06-27 (×2): qty 1

## 2012-06-27 MED ORDER — ONDANSETRON HCL 4 MG/2ML IJ SOLN: 4.0000 mg | Freq: Four times a day (QID) | INTRAMUSCULAR | Status: DC | PRN

## 2012-06-27 MED ORDER — ENOXAPARIN SODIUM 40 MG/0.4ML ~~LOC~~ SOLN
40.0000 mg | SUBCUTANEOUS | Status: DC
  Administered 2012-06-27 – 2012-06-28 (×2): 40 mg via SUBCUTANEOUS
  Filled 2012-06-27 (×2): qty 0.4

## 2012-06-27 MED ORDER — HYDROCHLOROTHIAZIDE 12.5 MG PO CAPS
12.5000 mg | ORAL_CAPSULE | Freq: Every day | ORAL | Status: DC
  Administered 2012-06-28 – 2012-06-29 (×2): 12.5 mg via ORAL
  Filled 2012-06-27 (×2): qty 1

## 2012-06-27 MED ORDER — HYDROCODONE-ACETAMINOPHEN 5-325 MG PO TABS
1.0000 | ORAL_TABLET | ORAL | Status: DC | PRN
  Administered 2012-06-29: 1 via ORAL
  Filled 2012-06-27: qty 1
  Filled 2012-06-27: qty 2

## 2012-06-27 MED ORDER — ONDANSETRON HCL 4 MG PO TABS: 4.0000 mg | ORAL_TABLET | Freq: Four times a day (QID) | ORAL | Status: DC | PRN

## 2012-06-27 NOTE — H&P (Signed)
[name] MRN: [id number] DOB/AGE: February 22, 1941 71 y.o. Primary Care Physician:Tracy Pryor L, MD Admit date: 06/27/2012 Chief Complaint: She came to the emergency room complaining of numbness on the left side. She was not able to walk. HPI: As noted above. She did not notice anything that made this occur. She has had some episodes in the past. She has cerebral palsy which has been pretty severe. She did not have any swallowing problems. She did not have any speech problems.  Past Medical History  Diagnosis Date  . Cerebral palsy   . Asthma   . Seasonal allergies   . Thyroid disease   . HTN (hypertension)   . Anxiety   . Arthritis   . Breast cancer   . Anginal pain   . Osteoporosis 05/08/2012   Past Surgical History  Procedure Date  . Breast lumpectomy   . Radical abdominal hysterectomy   . Dental extraction   . Right hand surgery         Family History  Problem Relation Age of Onset  . Cancer    . Diabetes    . Arthritis    . Asthma      Social History:  reports that she has never smoked. She has never used smokeless tobacco. She reports that she does not drink alcohol or use illicit drugs.   Allergies:  Allergies  Allergen Reactions  . Naproxen     Medications Prior to Admission  Medication Sig Dispense Refill  . aspirin 325 MG tablet Take 325 mg by mouth daily.        Marland Kitchen albuterol (PROVENTIL HFA;VENTOLIN HFA) 108 (90 BASE) MCG/ACT inhaler Inhale 2 puffs into the lungs every 6 (six) hours as needed. Shortness of breath      . ALPRAZolam (XANAX) 0.5 MG tablet Take 0.25 mg by mouth every 4 (four) hours. nerves      . Calcium Carbonate-Vitamin D (CALCIUM 600 + D PO) Take 1 tablet by mouth daily.        Marland Kitchen exemestane (AROMASIN) 25 MG tablet Take 1 tablet (25 mg total) by mouth daily after breakfast.  30 tablet  6  . gabapentin (NEURONTIN) 100 MG capsule Take 1 capsule (100 mg total) by mouth 3 (three) times daily.  90 capsule  2  . levothyroxine (SYNTHROID,  LEVOTHROID) 88 MCG tablet Take 88 mcg by mouth daily.      Marland Kitchen losartan-hydrochlorothiazide (HYZAAR) 50-12.5 MG per tablet Take 1 tablet by mouth daily.        . magnesium sulfate (EPSOM SALTS) crystals Take by mouth as needed. If very constipated      . traMADol (ULTRAM) 50 MG tablet Take 100 mg by mouth every 6 (six) hours as needed. Maximum dose= 8 tablets per day pain      . triamcinolone cream (KENALOG) 0.1 % Apply 1 application topically 2 (two) times daily as needed. Apply to affected areas           WUJ:WJXBJ from the symptoms mentioned above,there are no other symptoms referable to all systems reviewed.  Physical Exam: Blood pressure 128/94, pulse 97, temperature 98.2 F (36.8 C), temperature source Oral, resp. rate 22, weight 97.07 kg (214 lb), SpO2 95.00%. She is awake and alert. Her pupils are reactive. She has chronic neurological changes. Her neck is supple. She is edentulous. Her chest is clear. Her heart is regular. Her abdomen is soft. Extremities showed no edema. Her central nervous system examination shows chronic changes but no focal findings  now. Her speech is at baseline.    Basename 06/27/12 1111  WBC 5.4  NEUTROABS 4.0  HGB 13.2  HCT 42.7  MCV 91.4  PLT 139*    Basename 06/27/12 1111  NA 139  K 2.9*  CL 96  CO2 38*  GLUCOSE 106*  BUN 12  CREATININE 0.53  CALCIUM 8.9  MG --  lablast2(ast:2,ALT:2,alkphos:2,bilitot:2,prot:2,albumin:2)@    No results found for this or any previous visit (from the past 240 hour(s)).   Ct Head Wo Contrast  06/27/2012  *RADIOLOGY REPORT*  Clinical Data: Left arm and shoulder numbness.  History of cerebral palsy, hypertension and breast cancer.  CT HEAD WITHOUT CONTRAST  Technique:  Contiguous axial images were obtained from the base of the skull through the vertex without contrast.  Comparison: Previous CT and MR examinations, the most recent dated 04/13/2010 and 04/15/2010 respectively.  Findings: Streak artifacts produced  by a left sided hearing aid. The evaluation of the skull base is also limited by motion artifacts, despite repeating the images.  Stable probable prominent perivascular spaces in the basal ganglia bilaterally, remaining larger on the right.  The ventricles remain normal in size and position.  No intracranial hemorrhage, mass lesion or CT evidence of acute infarction.  Bilateral maxillary sinus mucosal thickening with a maximum thickness of the 13.0 mm on the left and 3.7 mm on the right.  Mild bilateral ethmoid and frontal sinus mucosal thickening.  IMPRESSION:  1.  No acute abnormality. 2.  Chronic sinusitis.  Original Report Authenticated By: Darrol Angel, M.D.   Impression: She may have had something like a TIA. This also could be related to her hypokalemia. She is supposed to be taking potassium at home but she is not because she says it makes her sick Active Problems:  * No active hospital problems. *      Plan: Because of her cerebral palsy and her difficulty in holding still I am not sure that trying to attempt an MRI is going to be helpful. I will ask for neurology consultation.      Wilferd Ritson [name] Pager [phone]  06/27/2012, 6:47 PM

## 2012-06-27 NOTE — ED Provider Notes (Signed)
History     CSN: [id number]  Arrival date & time 06/27/12  1032   First MD Initiated Contact with Patient 06/27/12 1041      Chief Complaint  Patient presents with  . Numbness    (Consider location/radiation/quality/duration/timing/severity/associated sxs/prior treatment) HPI....level V caveat for cerebral palsy. Patient complains of generalized weakness, numbness, worse on the left side. no mental status changes. Nothing makes symptoms better or worse. Symptoms started this morning. family reports she was so weak she could not walk.no prodromal illnesses  Past Medical History  Diagnosis Date  . Cerebral palsy   . Asthma   . Seasonal allergies   . Thyroid disease   . HTN (hypertension)   . Anxiety   . Arthritis   . Breast cancer   . Anginal pain   . Osteoporosis 05/08/2012    Past Surgical History  Procedure Date  . Breast lumpectomy   . Radical abdominal hysterectomy   . Dental extraction   . Right hand surgery     Family History  Problem Relation Age of Onset  . Cancer    . Diabetes    . Arthritis    . Asthma      History  Substance Use Topics  . Smoking status: Never Smoker   . Smokeless tobacco: Never Used  . Alcohol Use: No    OB History    Grav Para Term Preterm Abortions TAB SAB Ect Mult Living                  Review of Systems  Unable to perform ROS: Other    Allergies  Naproxen  Home Medications   Current Outpatient Rx  Name Route Sig Dispense Refill  . ASPIRIN 325 MG PO TABS Oral Take 325 mg by mouth daily.      . ALBUTEROL SULFATE HFA 108 (90 BASE) MCG/ACT IN AERS Inhalation Inhale 2 puffs into the lungs every 6 (six) hours as needed. Shortness of breath    . ALPRAZOLAM 0.5 MG PO TABS Oral Take 0.25 mg by mouth every 4 (four) hours. nerves    . CALCIUM 600 + D PO Oral Take 1 tablet by mouth daily.      Marland Kitchen EXEMESTANE 25 MG PO TABS Oral Take 1 tablet (25 mg total) by mouth daily after breakfast. 30 tablet 6  . GABAPENTIN 100 MG PO  CAPS Oral Take 1 capsule (100 mg total) by mouth 3 (three) times daily. 90 capsule 2  . LEVOTHYROXINE SODIUM 88 MCG PO TABS Oral Take 88 mcg by mouth daily.    Marland Kitchen LOSARTAN POTASSIUM-HCTZ 50-12.5 MG PO TABS Oral Take 1 tablet by mouth daily.      Marland Kitchen MAGNESIUM SULFATE GRAN Oral Take by mouth as needed. If very constipated    . TRAMADOL HCL 50 MG PO TABS Oral Take 100 mg by mouth every 6 (six) hours as needed. Maximum dose= 8 tablets per day pain    . TRIAMCINOLONE ACETONIDE 0.1 % EX CREA Topical Apply 1 application topically 2 (two) times daily as needed. Apply to affected areas      BP 96/41  Pulse 102  Temp 98.2 F (36.8 C) (Oral)  Resp 23  Wt 214 lb (97.07 kg)  SpO2 96%  Physical Exam  Nursing note and vitals reviewed. Constitutional: She is oriented to person, place, and time. She appears well-developed and well-nourished.       No confusion noted.  HENT:  Head: Normocephalic and atraumatic.  Eyes: Conjunctivae  and EOM are normal. Pupils are equal, round, and reactive to light.  Neck: Normal range of motion. Neck supple.  Cardiovascular: Normal rate and regular rhythm.   Pulmonary/Chest: Effort normal and breath sounds normal.  Abdominal: Soft. Bowel sounds are normal.  Musculoskeletal: Normal range of motion.  Neurological: She is alert and oriented to person, place, and time.       Appears to be moving arms and legs symmetrically  Skin: Skin is warm and dry.  Psychiatric: She has a normal mood and affect.    ED Course  Procedures (including critical care time)  Labs Reviewed  CBC WITH DIFFERENTIAL - Abnormal; Notable for the following:    Platelets 139 (*)     All other components within normal limits  BASIC METABOLIC PANEL - Abnormal; Notable for the following:    Potassium 2.9 (*)     CO2 38 (*)     Glucose, Bld 106 (*)     All other components within normal limits  URINALYSIS, ROUTINE W REFLEX MICROSCOPIC   Ct Head Wo Contrast  06/27/2012  *RADIOLOGY REPORT*   Clinical Data: Left arm and shoulder numbness.  History of cerebral palsy, hypertension and breast cancer.  CT HEAD WITHOUT CONTRAST  Technique:  Contiguous axial images were obtained from the base of the skull through the vertex without contrast.  Comparison: Previous CT and MR examinations, the most recent dated 04/13/2010 and 04/15/2010 respectively.  Findings: Streak artifacts produced by a left sided hearing aid. The evaluation of the skull base is also limited by motion artifacts, despite repeating the images.  Stable probable prominent perivascular spaces in the basal ganglia bilaterally, remaining larger on the right.  The ventricles remain normal in size and position.  No intracranial hemorrhage, mass lesion or CT evidence of acute infarction.  Bilateral maxillary sinus mucosal thickening with a maximum thickness of the 13.0 mm on the left and 3.7 mm on the right.  Mild bilateral ethmoid and frontal sinus mucosal thickening.  IMPRESSION:  1.  No acute abnormality. 2.  Chronic sinusitis.  Original Report Authenticated By: Darrol Angel, M.D.     No diagnosis found.  Date: 06/27/2012  Rate: 102  Rhythm: sinus tachycardia  QRS Axis: normal  Intervals: normal  ST/T Wave abnormalities: normal  Conduction Disutrbances:none  Narrative Interpretation:   Old EKG Reviewed: changes noted    MDM  Uncertain etiology of patient's weakness left side worse than right. Difficult to do a good physical exam secondary to cerebral palsy. Hypokalemia noted. Will admit        Donnetta Hutching, MD 06/27/12 1326

## 2012-06-27 NOTE — ED Notes (Signed)
Pt c/o left arm/shoulder numbness and having difficulty ambulating since 0900 per caregivers.

## 2012-06-28 ENCOUNTER — Inpatient Hospital Stay (HOSPITAL_COMMUNITY): Payer: Medicare Other

## 2012-06-28 LAB — BASIC METABOLIC PANEL
BUN: 10 mg/dL (ref 6–23)
CO2: 33 mEq/L — ABNORMAL HIGH (ref 19–32)
Calcium: 8.6 mg/dL (ref 8.4–10.5)
Chloride: 97 mEq/L (ref 96–112)
Creatinine, Ser: 0.5 mg/dL (ref 0.50–1.10)
GFR calc Af Amer: >90 mL/min (ref >90)
GFR calc non Af Amer: >90 mL/min (ref >90)
Glucose, Bld: 104 mg/dL — ABNORMAL HIGH (ref 70–99)
Potassium: 3.2 mEq/L — ABNORMAL LOW (ref 3.5–5.1)
Sodium: 138 mEq/L (ref 135–145)

## 2012-06-28 LAB — URINE MICROSCOPIC-ADD ON

## 2012-06-28 LAB — URINALYSIS, ROUTINE W REFLEX MICROSCOPIC
Bilirubin Urine: NEGATIVE
Glucose, UA: NEGATIVE mg/dL
Ketones, ur: NEGATIVE mg/dL
Nitrite: NEGATIVE
Protein, ur: 30 mg/dL — AB
Specific Gravity, Urine: >1.03 — ABNORMAL HIGH (ref 1.005–1.030)
Urobilinogen, UA: 0.2 mg/dL (ref 0.0–1.0)
pH: 5.5 (ref 5.0–8.0)

## 2012-06-28 MED ORDER — CLOPIDOGREL BISULFATE 75 MG PO TABS
75.0000 mg | ORAL_TABLET | Freq: Every day | ORAL | Status: DC
  Administered 2012-06-29: 75 mg via ORAL
  Filled 2012-06-28: qty 1

## 2012-06-28 MED ORDER — POTASSIUM CHLORIDE 20 MEQ/15ML (10%) PO LIQD
20.0000 meq | Freq: Two times a day (BID) | ORAL | Status: DC
  Administered 2012-06-28 – 2012-06-29 (×3): 20 meq via ORAL
  Filled 2012-06-28 (×4): qty 30

## 2012-06-28 NOTE — Consult Note (Signed)
[name], [name]                ACCOUNT NO.:  [id number]  MEDICAL RECORD NO.:  [id number]  LOCATION:  A315                          FACILITY:  [facility]  PHYSICIAN:  Sherrey North A. Gerilyn Pilgrim, M.D. DATE OF BIRTH:  [birth year]  DATE OF CONSULTATION: DATE OF DISCHARGE:                                CONSULTATION   The patient is a 71 year old white female, who presents with the acute onset of the gait impairment.  She apparently went to use a bathroom. She typically uses a walker.  She has a history of cerebral palsy, but on turning back to the bathroom, she reports that all of a sudden her legs became weak.  I did ask her repeatedly which side was worse and she clearly stated the left side was weaker than the right.  However, she indicates that at baseline her left lower extremity is worse than her right.  She also reports having some numbness of the left leg.  She went to sit down and just could not get up.  She reported that she just could not get up to do what she need to do and decided to seek medical attention.  Her family was around, also her caregiver.  The patient had a workup, which showed a low potassium of 2.9.  Urinalysis done today, it is positive with many leukocytes and rbc's 11-20.  The patient does report that her urine was a little redder than typically it is.  She reports having mild headache this morning.  She reports that she may have had some slurring of her speech which lasted only for a few minutes and returned back to baseline when EMS came.  She denies blurred vision or diplopia.  PHYSICAL EXAMINATION:  GENERAL:  A very pleasant, obese lady in no acute distress. HEENT:  Head is normocephalic, atraumatic.  She has continuous orofacial dyskinesias, seemed to be her baseline. NECK:  Supple. ABDOMEN:  Obese but soft. EXTREMITIES:  She has significant reduced range of motion of the right upper extremity, particularly shoulders.  Particularly at the shoulders, she reports  that she has a rotator cuff injury there.  She only can lift it for about 30 degrees.  Reduced range of motion also of left to about 80 degrees.  There is significant osteophytic changes of the lower extremities. MENTATION:  She is awake and alert.  She converses fairly well, despite having a marked baseline dysarthria.  She takes a long time to say what she needs to stay, but she is lucid and coherent.  Follow commands well. She has good insight. CRANIAL NERVES:  The pupils are 5 mm and briskly reactive.  Visual fields are intact.  Extraocular movements are full.  Facial muscle strength is symmetric.  Tongue is midline.  Uvula midline.  Shoulder shrugs limited bilaterally, especially on the right side. MOTOR:  Good triceps bilaterally.  Deltoid is actually normal on the left, 4 in the right, again likely due to the rotator cuff injury on that side.  She has weak left lower extremity hip flexion 4+, dorsiflexion 5; right lower extremity dorsiflexion and hip flexion 5. Bulk is unremarkable throughout. COORDINATION:  She is noted to have some  occasional athetoid-type movements of the hands bilaterally.  I do not see any chorea, however. Reflexes are preserved throughout.  Plantars are both upgoing. Sensation normal to light touch and temperature.  IMPRESSION:  Acute gait impairment with worsening left-sided weakness and numbness in the setting of hypokalemia and urinary tract infection. I believe these 2 are underlying etiology of metabolic derangement. Seems the most likely etiology, I think, this patient likely has worsening of baseline neurologic function from these etiology. Certainly, she could have had a small stroke, although it would have to be locating in the area to explain the weakness of both lower extremities.  Again, she has baseline gait impairment from cerebral palsy.  RECOMMENDATION:  We did discuss about doing an MRI, but she has tried 2 times in the past and reports  that she just cannot do the test.  She has been started on Plavix.  I believe this is reasonable.  I think we should continue this for a month or two and subsequently switch her to a single antiplatelet agent.  Carotid duplex Doppler had been done and shows no hemodynamic significant stenosis.  I agree with physical therapy.  Also, I would suggest lipid panel and other blood tests such as homocysteine level.  Thanks for this consultation.     Aalyssa Elderkin A. Gerilyn Pilgrim, M.D.     KAD/MEDQ  D:  06/28/2012  T:  06/28/2012  Job:  [id number]

## 2012-06-28 NOTE — Progress Notes (Signed)
UR chart review completed.  

## 2012-06-28 NOTE — Care Management Note (Signed)
    Page 1 of 2   06/29/2012     1:36:25 PM   CARE MANAGEMENT NOTE 06/29/2012  Patient:  [name], [name]   Account Number:  [id number]  Date Initiated:  06/28/2012  Documentation initiated by:  Sharrie Rothman  Subjective/Objective Assessment:   Pt admitted from home with hypokalemia and possible tia. Pt has CAP aide 7 hours a day M-F, 3 hours on Saturday, and none on Sunday. Pts sister stays with pt at night Sunday-THursday, and her son stays at night Friday and Saturday.     Action/Plan:   CM will obtain order to resume [facility] RN at discharge. No DME needs noted at this time.   Anticipated DC Date:  06/30/2012   Anticipated DC Plan:  HOME W HOME HEALTH SERVICES      DC Planning Services  CM consult      [facility] Choice  Resumption Of Svcs/PTA Provider   Choice offered to / List presented to:  C-1 Patient        HH arranged  HH-1 RN  HH-2 PT      [facility] agency  [facility]   Status of service:  Completed, signed off Medicare Important Message given?   (If response is "NO", the following Medicare IM given date fields will be blank) Date Medicare IM given:   Date Additional Medicare IM given:    Discharge Disposition:  HOME W HOME HEALTH SERVICES  Per UR Regulation:    If discussed at Long Length of Stay Meetings, dates discussed:    Comments:  06/29/12 1334 Arlyss Queen, RN BSN CM Pt discharged home with [facility] RN and PT. Alroy Bailiff of [facility] is aware and will collect the pts information from the chart. Pt has no DME needs at this time. HH services to start 24-48 hours after discharge. Pt and pts nurse aware of discharge arrangements. 06/28/12 1105 Arlyss Queen, RN BSN CM Pt sister stays with pt on Sunday. Pt has a life alert button, a rollator, walker, electric wheelchair, lift chair, hospital bed, BSC. Pt states that she is going back home at discharge.Pt also has [facility] RN and would like PT at discharge.

## 2012-06-28 NOTE — Progress Notes (Signed)
Subjective: She is about the same. No further neurological symptoms.  Objective: Vital signs in last 24 hours: Temp:  [97.8 F (36.6 C)-98.2 F (36.8 C)] 97.8 F (36.6 C) (07/10 0215) Pulse Rate:  [88-102] 88  (07/10 0215) Resp:  [18-23] 18  (07/10 0215) BP: (96-128)/(41-94) 105/68 mmHg (07/10 0215) SpO2:  [91 %-96 %] 91 % (07/10 0215) Weight:  [97.07 kg (214 lb)-100.5 kg (221 lb 9 oz)] 100.5 kg (221 lb 9 oz) (07/10 0553) Weight change:  Last BM Date: 06/28/12  Intake/Output from previous day: 07/09 0701 - 07/10 0700 In: 360 [P.O.:360] Out: -   PHYSICAL EXAM General appearance: alert, cooperative and no distress Resp: clear to auscultation bilaterally Cardio: regular rate and rhythm, S1, S2 normal, no murmur, click, rub or gallop GI: soft, non-tender; bowel sounds normal; no masses,  no organomegaly Extremities: extremities normal, atraumatic, no cyanosis or edema  Lab Results:    Basic Metabolic Panel:  Basename 06/28/12 0406 06/27/12 1111  NA 138 139  K 3.2* 2.9*  CL 97 96  CO2 33* 38*  GLUCOSE 104* 106*  BUN 10 12  CREATININE 0.50 0.53  CALCIUM 8.6 8.9  MG -- --  PHOS -- --   Liver Function Tests: No results found for this basename: AST:2,ALT:2,ALKPHOS:2,BILITOT:2,PROT:2,ALBUMIN:2 in the last 72 hours No results found for this basename: LIPASE:2,AMYLASE:2 in the last 72 hours No results found for this basename: AMMONIA:2 in the last 72 hours CBC:  Basename 06/27/12 1111  WBC 5.4  NEUTROABS 4.0  HGB 13.2  HCT 42.7  MCV 91.4  PLT 139*   Cardiac Enzymes: No results found for this basename: CKTOTAL:3,CKMB:3,CKMBINDEX:3,TROPONINI:3 in the last 72 hours BNP: No results found for this basename: PROBNP:3 in the last 72 hours D-Dimer: No results found for this basename: DDIMER:2 in the last 72 hours CBG: No results found for this basename: GLUCAP:6 in the last 72 hours Hemoglobin A1C: No results found for this basename: HGBA1C in the last 72  hours Fasting Lipid Panel: No results found for this basename: CHOL,HDL,LDLCALC,TRIG,CHOLHDL,LDLDIRECT in the last 72 hours Thyroid Function Tests: No results found for this basename: TSH,T4TOTAL,FREET4,T3FREE,THYROIDAB in the last 72 hours Anemia Panel: No results found for this basename: VITAMINB12,FOLATE,FERRITIN,TIBC,IRON,RETICCTPCT in the last 72 hours Coagulation: No results found for this basename: LABPROT:2,INR:2 in the last 72 hours Urine Drug Screen: Drugs of Abuse  No results found for this basename: labopia, cocainscrnur, labbenz, amphetmu, thcu, labbarb    Alcohol Level: No results found for this basename: ETH:2 in the last 72 hours Urinalysis: No results found for this basename: COLORURINE:2,APPERANCEUR:2,LABSPEC:2,PHURINE:2,GLUCOSEU:2,HGBUR:2,BILIRUBINUR:2,KETONESUR:2,PROTEINUR:2,UROBILINOGEN:2,NITRITE:2,LEUKOCYTESUR:2 in the last 72 hours Misc. Labs:  ABGS No results found for this basename: PHART,PCO2,PO2ART,TCO2,HCO3 in the last 72 hours CULTURES Recent Results (from the past 240 hour(s))  MRSA PCR SCREENING     Status: Normal   Collection Time   06/27/12  5:04 PM      Component Value Range Status Comment   MRSA by PCR NEGATIVE  NEGATIVE Final    Studies/Results: Ct Head Wo Contrast  06/27/2012  *RADIOLOGY REPORT*  Clinical Data: Left arm and shoulder numbness.  History of cerebral palsy, hypertension and breast cancer.  CT HEAD WITHOUT CONTRAST  Technique:  Contiguous axial images were obtained from the base of the skull through the vertex without contrast.  Comparison: Previous CT and MR examinations, the most recent dated 04/13/2010 and 04/15/2010 respectively.  Findings: Streak artifacts produced by a left sided hearing aid. The evaluation of the skull base is also limited  by motion artifacts, despite repeating the images.  Stable probable prominent perivascular spaces in the basal ganglia bilaterally, remaining larger on the right.  The ventricles remain normal in  size and position.  No intracranial hemorrhage, mass lesion or CT evidence of acute infarction.  Bilateral maxillary sinus mucosal thickening with a maximum thickness of the 13.0 mm on the left and 3.7 mm on the right.  Mild bilateral ethmoid and frontal sinus mucosal thickening.  IMPRESSION:  1.  No acute abnormality. 2.  Chronic sinusitis.  Original Report Authenticated By: Darrol Angel, M.D.    Medications:  Scheduled:   . ALPRAZolam  0.25 mg Oral Q4H  . aspirin  325 mg Oral Daily  . clopidogrel  75 mg Oral Q breakfast  . enoxaparin (LOVENOX) injection  40 mg Subcutaneous Q24H  . exemestane  25 mg Oral QPC breakfast  . gabapentin  100 mg Oral TID  . losartan  50 mg Oral Daily   And  . hydrochlorothiazide  12.5 mg Oral Daily  . levothyroxine  88 mcg Oral QAC breakfast  . potassium chloride  40 mEq Oral Once  . potassium chloride  20 mEq Oral BID  . sodium chloride  3 mL Intravenous Q12H  . DISCONTD: losartan-hydrochlorothiazide  1 tablet Oral Daily   Continuous:  RUE:[id number], acetaminophen, albuterol, alum & mag hydroxide-simeth, HYDROcodone-acetaminophen, ondansetron (ZOFRAN) IV, ondansetron, traMADol, traZODone  Assesment: She has had a TIA. She has baseline cerebral palsy and multiple other medical problems but that seems pretty stable right now Active Problems:  * No active hospital problems. *     Plan: She's to have carotid Doppler and she will be evaluated by the neurologist    LOS: 1 day   Devun Anna L 06/28/2012, 8:46 AM

## 2012-06-28 NOTE — Evaluation (Signed)
Physical Therapy Evaluation Patient Details Name: [name] MRN: [id number] DOB: [birth year] Today's Date: 06/28/2012 Time: 4098-1191 PT Time Calculation (min): 27 min  PT Assessment / Plan / Recommendation Clinical Impression  Pt is alert and very cooperative, anxious to get home.  I could not detect any increased weakness in LLE and sensation appears to be WNL.   Her gait pattern is at baseline but dndurance is decreased from her norm.  She was able to walk 25' x 2 with SBA using her walker..  I would recommend HHPT at d/c for generalized deconditioning.    PT Assessment  Patient needs continued PT services    Follow Up Recommendations  Home health PT    Barriers to Discharge None      Equipment Recommendations  Defer to next venue    Recommendations for Other Services     Frequency Min 3X/week    Precautions / Restrictions Precautions Precautions: Fall Restrictions Weight Bearing Restrictions: No   Pertinent Vitals/Pain       Mobility  Bed Mobility Bed Mobility: Sit to Supine;Supine to Sit Supine to Sit: 5: Supervision;HOB elevated Sit to Supine: 4: Min assist;HOB elevated Transfers Transfers: Sit to Stand;Stand to Sit Sit to Stand: 4: Min guard Stand to Sit: 4: Min guard Ambulation/Gait Ambulation/Gait Assistance: 4: Min guard Assistive device: Rolling walker;4-wheeled walker Gait Pattern: Within Functional Limits;Lateral trunk lean to right Stairs: No Wheelchair Mobility Wheelchair Mobility: No    Exercises     PT Diagnosis: Difficulty walking;Abnormality of gait;Generalized weakness  PT Problem List: Decreased activity tolerance;Decreased mobility;Obesity PT Treatment Interventions: Gait training;Therapeutic exercise;Patient/family education   PT Goals Acute Rehab PT Goals PT Goal Formulation: With patient Pt will Ambulate: 16 - 50 feet;with supervision;with rolling walker PT Goal: Ambulate - Progress: Goal set today  Visit Information  Last PT  Received On: 06/28/12    Subjective Data  Subjective: wants to go home   Prior Functioning  Home Living Lives With: Alone Available Help at Discharge: Personal care attendant;Family Type of Home: House Home Access: Stairs to enter;Level entry Home Layout: One level Bathroom Shower/Tub: Engineer, manufacturing systems: Standard Home Adaptive Equipment: Wheelchair - powered Prior Function Level of Independence: Needs assistance Able to Take Stairs?: No Driving: No Vocation: On disability Communication Communication: No difficulties    Cognition  Overall Cognitive Status: Appears within functional limits for tasks assessed/performed Arousal/Alertness: Awake/alert Orientation Level: Appears intact for tasks assessed Behavior During Session: [facility] for tasks performed    Extremity/Trunk Assessment Right Lower Extremity Assessment RLE ROM/Strength/Tone: WFL for tasks assessed RLE Sensation: WFL - Light Touch;WFL - Proprioception RLE Coordination: WFL - gross motor Left Lower Extremity Assessment LLE ROM/Strength/Tone: WFL for tasks assessed LLE Sensation: WFL - Light Touch;WFL - Proprioception LLE Coordination: WFL - gross motor Trunk Assessment Trunk Assessment: Kyphotic (kyphoscoliosis)   Balance Balance Balance Assessed: No  End of Session PT - End of Session Equipment Utilized During Treatment: Gait belt Activity Tolerance: Patient tolerated treatment well Patient left: in bed;with call bell/phone within reach;with bed alarm set;with nursing in room Nurse Communication: Mobility status  GP     [name] 06/28/2012, 3:26 PM

## 2012-06-28 NOTE — Progress Notes (Signed)
*  PRELIMINARY RESULTS* Echocardiogram 2D Echocardiogram has been performed.  [name] 06/28/2012, 2:35 PM

## 2012-06-28 NOTE — Consult Note (Signed)
Reason for Consult: Referring Physician:   [name] is an 71 y.o. female.  HPI:   Past Medical History  Diagnosis Date  . Cerebral palsy   . Asthma   . Seasonal allergies   . Thyroid disease   . HTN (hypertension)   . Anxiety   . Arthritis   . Breast cancer   . Anginal pain   . Osteoporosis 05/08/2012    Past Surgical History  Procedure Date  . Breast lumpectomy   . Radical abdominal hysterectomy   . Dental extraction   . Right hand surgery     Family History  Problem Relation Age of Onset  . Cancer    . Diabetes    . Arthritis    . Asthma      Social History:  reports that she has never smoked. She has never used smokeless tobacco. She reports that she does not drink alcohol or use illicit drugs.  Allergies:  Allergies  Allergen Reactions  . Naproxen Other (See Comments)    unknown    Medications:  Prior to Admission medications   Medication Sig Start Date End Date Taking? Authorizing Provider  albuterol (PROVENTIL HFA;VENTOLIN HFA) 108 (90 BASE) MCG/ACT inhaler Inhale 2 puffs into the lungs every 6 (six) hours as needed. Shortness of breath   Yes Historical Provider, MD  ALPRAZolam (XANAX) 0.5 MG tablet Take 0.25 mg by mouth every 4 (four) hours. nerves   Yes Historical Provider, MD  aspirin 325 MG tablet Take 325 mg by mouth daily.     Yes Historical Provider, MD  Calcium Carbonate-Vitamin D (CALCIUM 600 + D PO) Take 1 tablet by mouth daily.     Yes Historical Provider, MD  exemestane (AROMASIN) 25 MG tablet Take 1 tablet (25 mg total) by mouth daily after breakfast. 05/09/12  Yes Ellouise Newer, PA  gabapentin (NEURONTIN) 100 MG capsule Take 1 capsule (100 mg total) by mouth 3 (three) times daily. 02/10/12 02/09/13 Yes Vickki Hearing, MD  levothyroxine (SYNTHROID, LEVOTHROID) 88 MCG tablet Take 88 mcg by mouth daily.   Yes Historical Provider, MD  losartan-hydrochlorothiazide (HYZAAR) 50-12.5 MG per tablet Take 1 tablet by mouth daily.     Yes  Historical Provider, MD  magnesium sulfate (EPSOM SALTS) crystals Take by mouth as needed. If very constipated   Yes Historical Provider, MD  traMADol (ULTRAM) 50 MG tablet Take 100 mg by mouth every 6 (six) hours as needed. Maximum dose= 8 tablets per day pain   Yes Historical Provider, MD  triamcinolone cream (KENALOG) 0.1 % Apply 1 application topically 2 (two) times daily as needed. Apply to affected areas   Yes Historical Provider, MD   Scheduled Meds:   . ALPRAZolam  0.25 mg Oral Q4H  . aspirin  325 mg Oral Daily  . clopidogrel  75 mg Oral Q breakfast  . enoxaparin (LOVENOX) injection  40 mg Subcutaneous Q24H  . exemestane  25 mg Oral QPC breakfast  . gabapentin  100 mg Oral TID  . losartan  50 mg Oral Daily   And  . hydrochlorothiazide  12.5 mg Oral Daily  . levothyroxine  88 mcg Oral QAC breakfast  . potassium chloride  20 mEq Oral BID  . sodium chloride  3 mL Intravenous Q12H   Continuous Infusions:  PRN Meds:.acetaminophen, acetaminophen, albuterol, alum & mag hydroxide-simeth, HYDROcodone-acetaminophen, ondansetron (ZOFRAN) IV, ondansetron, traMADol, traZODone   Results for orders placed during the hospital encounter of 06/27/12 (from the past 48  hour(s))  CBC WITH DIFFERENTIAL     Status: Abnormal   Collection Time   06/27/12 11:11 AM      Component Value Range Comment   WBC 5.4  4.0 - 10.5 K/uL    RBC 4.67  3.87 - 5.11 MIL/uL    Hemoglobin 13.2  12.0 - 15.0 g/dL    HCT 16.1  09.6 - 04.5 %    MCV 91.4  78.0 - 100.0 fL    MCH 28.3  26.0 - 34.0 pg    MCHC 30.9  30.0 - 36.0 g/dL    RDW 40.9  81.1 - 91.4 %    Platelets 139 (*) 150 - 400 K/uL    Neutrophils Relative 74  43 - 77 %    Neutro Abs 4.0  1.7 - 7.7 K/uL    Lymphocytes Relative 18  12 - 46 %    Lymphs Abs 1.0  0.7 - 4.0 K/uL    Monocytes Relative 8  3 - 12 %    Monocytes Absolute 0.4  0.1 - 1.0 K/uL    Eosinophils Relative 0  0 - 5 %    Eosinophils Absolute 0.0  0.0 - 0.7 K/uL    Basophils Relative 0  0 -  1 %    Basophils Absolute 0.0  0.0 - 0.1 K/uL   BASIC METABOLIC PANEL     Status: Abnormal   Collection Time   06/27/12 11:11 AM      Component Value Range Comment   Sodium 139  135 - 145 mEq/L    Potassium 2.9 (*) 3.5 - 5.1 mEq/L    Chloride 96  96 - 112 mEq/L    CO2 38 (*) 19 - 32 mEq/L    Glucose, Bld 106 (*) 70 - 99 mg/dL    BUN 12  6 - 23 mg/dL    Creatinine, Ser 7.82  0.50 - 1.10 mg/dL    Calcium 8.9  8.4 - 95.6 mg/dL    GFR calc non Af Amer >90  >90 mL/min    GFR calc Af Amer >90  >90 mL/min   MRSA PCR SCREENING     Status: Normal   Collection Time   06/27/12  5:04 PM      Component Value Range Comment   MRSA by PCR NEGATIVE  NEGATIVE   BASIC METABOLIC PANEL     Status: Abnormal   Collection Time   06/28/12  4:06 AM      Component Value Range Comment   Sodium 138  135 - 145 mEq/L    Potassium 3.2 (*) 3.5 - 5.1 mEq/L    Chloride 97  96 - 112 mEq/L    CO2 33 (*) 19 - 32 mEq/L    Glucose, Bld 104 (*) 70 - 99 mg/dL    BUN 10  6 - 23 mg/dL    Creatinine, Ser 2.13  0.50 - 1.10 mg/dL    Calcium 8.6  8.4 - 08.6 mg/dL    GFR calc non Af Amer >90  >90 mL/min    GFR calc Af Amer >90  >90 mL/min   URINALYSIS, ROUTINE W REFLEX MICROSCOPIC     Status: Abnormal   Collection Time   06/28/12  3:51 PM      Component Value Range Comment   Color, Urine YELLOW  YELLOW    APPearance HAZY (*) CLEAR    Specific Gravity, Urine >1.030 (*) 1.005 - 1.030    pH 5.5  5.0 - 8.0  Glucose, UA NEGATIVE  NEGATIVE mg/dL    Hgb urine dipstick LARGE (*) NEGATIVE    Bilirubin Urine NEGATIVE  NEGATIVE    Ketones, ur NEGATIVE  NEGATIVE mg/dL    Protein, ur 30 (*) NEGATIVE mg/dL    Urobilinogen, UA 0.2  0.0 - 1.0 mg/dL    Nitrite NEGATIVE  NEGATIVE    Leukocytes, UA MODERATE (*) NEGATIVE   URINE MICROSCOPIC-ADD ON     Status: Abnormal   Collection Time   06/28/12  3:51 PM      Component Value Range Comment   Squamous Epithelial / LPF FEW (*) RARE    WBC, UA TOO NUMEROUS TO COUNT  <3 WBC/hpf    RBC /  HPF 11-20  <3 RBC/hpf    Bacteria, UA MANY (*) RARE     Ct Head Wo Contrast  06/27/2012  *RADIOLOGY REPORT*  Clinical Data: Left arm and shoulder numbness.  History of cerebral palsy, hypertension and breast cancer.  CT HEAD WITHOUT CONTRAST  Technique:  Contiguous axial images were obtained from the base of the skull through the vertex without contrast.  Comparison: Previous CT and MR examinations, the most recent dated 04/13/2010 and 04/15/2010 respectively.  Findings: Streak artifacts produced by a left sided hearing aid. The evaluation of the skull base is also limited by motion artifacts, despite repeating the images.  Stable probable prominent perivascular spaces in the basal ganglia bilaterally, remaining larger on the right.  The ventricles remain normal in size and position.  No intracranial hemorrhage, mass lesion or CT evidence of acute infarction.  Bilateral maxillary sinus mucosal thickening with a maximum thickness of the 13.0 mm on the left and 3.7 mm on the right.  Mild bilateral ethmoid and frontal sinus mucosal thickening.  IMPRESSION:  1.  No acute abnormality. 2.  Chronic sinusitis.  Original Report Authenticated By: Darrol Angel, M.D.   US Carotid Duplex Bilateral  06/28/2012  *RADIOLOGY REPORT*  Clinical Data: TIA symptoms  BILATERAL CAROTID DUPLEX ULTRASOUND  Technique: Wallace Cullens scale imaging, color Doppler and duplex ultrasound was performed of bilateral carotid and vertebral arteries in the neck.  Comparison:  04/13/2010  Criteria:  Quantification of carotid stenosis is based on velocity parameters that correlate the residual internal carotid diameter with NASCET-based stenosis levels, using the diameter of the distal internal carotid lumen as the denominator for stenosis measurement.  The following velocity measurements were obtained:                   PEAK SYSTOLIC/END DIASTOLIC RIGHT ICA:                        86/19cm/sec CCA:                        107/20cm/sec SYSTOLIC ICA/CCA  RATIO:     0.80 DIASTOLIC ICA/CCA RATIO:    0.94 ECA:                        105cm/sec  LEFT ICA:                        114/23cm/sec CCA:                        92/22cm/sec SYSTOLIC ICA/CCA RATIO:     1.23 DIASTOLIC ICA/CCA RATIO:    1.08 ECA:  87cm/sec  Findings:  RIGHT CAROTID ARTERY: No significant atherosclerosis demonstrated. No hemodynamically significant ICA stenosis, velocity elevation, turbulent flow.  RIGHT VERTEBRAL ARTERY:  Antegrade  LEFT CAROTID ARTERY: Tortuous carotid system.  No significant atherosclerosis.  No hemodynamically significant left ICA stenosis, velocity elevation, or turbulent flow.  LEFT VERTEBRAL ARTERY:  Not visualized, as before  IMPRESSION: No significant ICA stenosis by ultrasound.  Nonvisualization of the left vertebral artery  Original Report Authenticated By: Judie Petit. Ruel Favors, M.D.    Review of Systems  Constitutional: Negative.   Eyes: Negative.   Respiratory: Negative.   Cardiovascular: Negative.   Gastrointestinal: Negative.   Genitourinary: Positive for hematuria.  Skin: Negative.   Neurological: Positive for headaches.   Blood pressure 95/67, pulse 91, temperature 97.4 F (36.3 C), temperature source Oral, resp. rate 20, height 5' 4.5" (1.638 m), weight 100.5 kg (221 lb 9 oz), SpO2 90.00%. Physical Exam  Assessment/Plan: See dict  Reganne Messerschmidt 06/28/2012, 7:33 PM

## 2012-06-29 LAB — LIPID PANEL
Cholesterol: 156 mg/dL (ref 0–200)
LDL Cholesterol: 66 mg/dL (ref 0–99)
Total CHOL/HDL Ratio: 2.4 RATIO
VLDL: 26 mg/dL (ref 0–40)

## 2012-06-29 LAB — TSH: TSH: 4.729 u[IU]/mL — ABNORMAL HIGH (ref 0.350–4.500)

## 2012-06-29 MED ORDER — CLOPIDOGREL BISULFATE 75 MG PO TABS: 75.0000 mg | ORAL_TABLET | Freq: Every day | ORAL | Status: DC

## 2012-06-29 MED ORDER — CIPROFLOXACIN HCL 250 MG PO TABS: 250.0000 mg | ORAL_TABLET | Freq: Two times a day (BID) | ORAL | Status: AC

## 2012-06-29 NOTE — Progress Notes (Signed)
Pt discharged home today per Dr. Juanetta Gosling. Pt's IV site D/C'd and WNL. Pt's VS stable at this time. Pt's sister provided with home medication list and discharge instructions. Sister verbalized understanding. Pt left floor via WC in stable condition accompanied by NT.

## 2012-06-30 NOTE — Discharge Summary (Signed)
NAMESANDRALEE, [name]                ACCOUNT NO.:  [id number]  MEDICAL RECORD NO.:  [id number]  LOCATION:                                 FACILITY:  PHYSICIAN:  Elizeth Weinrich L. Juanetta Gosling, M.D.DATE OF BIRTH:  06/27/1941  DATE OF ADMISSION:  06/21/2012 DATE OF DISCHARGE:  07/11/2013LH                              DISCHARGE SUMMARY   DISCHARGE DIAGNOSES: 1. Acute gait impairment. 2. Urinary tract infection. 3. Hypokalemia. 4. Asthma/chronic obstructive pulmonary disease. 5. History of breast cancer. 6. Hypertension. 7. Anxiety. 8. Cerebral palsy. 9. Hyperthyroidism.  This is a 71 year old who came to the emergency room with difficulty walking.  She was evaluated in the emergency room and had a CT scan which did not show __________ Neurology consultation __________ MRI of the brain __________.  She has had evidence of urinary tract infection and had some hypokalemia.  She had correction of these metabolic problems and improved.  She had PT evaluation and was felt that she would need to have outpatient physical therapy.  This is being arranged. She is being discharged home after having an echocardiogram that did not show a definite cause, but was a poor study  having had carotid study which did not show definite abnormalities but was not perfectly normal. She is discharged home on tramadol 50 mg every 6 hours as needed for pain, triamcinolone cream 0.1% as needed, Hyzaar 50/12.5 daily, Synthroid 88 mcg daily, Epsom salt as needed for constipation, gabapentin 100 mg t.i.d., Aromasin 25 mg daily, calcium carbonate with vitamin D daily, Xanax 0.25 mg every 4 hours as needed, albuterol inhaler 2 puffs q.6 hours as needed, aspirin 325 mg daily, Plavix 75 mg daily. We are going to switch from her potassium replacement to an effervescent salt 20 mEq daily and she will take Cipro 250 mg b.i.d. x7 days.  She will have home health services, and she will follow up in my office.     Fleurette Woolbright L.  Juanetta Gosling, M.D.     ELH/MEDQ  D:  06/29/2012  T:  06/30/2012  Job:  [id number]

## 2012-06-30 NOTE — Progress Notes (Signed)
UR chart review completed.  

## 2012-06-30 NOTE — Progress Notes (Signed)
[name], [name]                ACCOUNT NO.:  [id number]  MEDICAL RECORD NO.:  [id number]  LOCATION:                                 FACILITY:  PHYSICIAN:  Kirsty Monjaraz L. Juanetta Gosling, M.D.DATE OF BIRTH:  [birth year]  DATE OF PROCEDURE:  06/29/2012 DATE OF DISCHARGE:  06/29/2012                                PROGRESS NOTE   Problems include TIA, hypertension, asthma/COPD, history of breast cancer and cerebral palsy.  [name] says she feels much better.  She has no new complaints.  Her breathing is much improved.  Her heart is regular.  Abdomen is soft.  Extremities showed no edema.  Central nervous system examination is grossly intact.  She does have difficulty still with her cerebral palsy.  Overall, she is better.  She has been seen by the neurologist, and is felt not to need further workup now that she had echocardiogram and carotid study done.  My plan is to discharge her home today.  Please see discharge summary for details.  She will have Home Health Services.     Krystn Dermody L. Juanetta Gosling, M.D.     ELH/MEDQ  D:  06/29/2012  T:  06/29/2012  Job:  [id number]

## 2012-10-16 ENCOUNTER — Other Ambulatory Visit (HOSPITAL_COMMUNITY): Payer: Self-pay | Admitting: Oncology

## 2012-10-16 DIAGNOSIS — C50919 Malignant neoplasm of unspecified site of unspecified female breast: Secondary | ICD-10-CM

## 2012-11-10 ENCOUNTER — Encounter (HOSPITAL_COMMUNITY): Payer: Medicare Other

## 2012-11-10 ENCOUNTER — Encounter (HOSPITAL_COMMUNITY): Payer: Medicare Other | Attending: Oncology | Admitting: Oncology

## 2012-11-10 VITALS — BP 121/76 | HR 115 | Temp 98.1°F | Resp 24 | Wt 229.8 lb

## 2012-11-10 DIAGNOSIS — E039 Hypothyroidism, unspecified: Secondary | ICD-10-CM | POA: Insufficient documentation

## 2012-11-10 DIAGNOSIS — M810 Age-related osteoporosis without current pathological fracture: Secondary | ICD-10-CM | POA: Insufficient documentation

## 2012-11-10 DIAGNOSIS — C50919 Malignant neoplasm of unspecified site of unspecified female breast: Secondary | ICD-10-CM | POA: Insufficient documentation

## 2012-11-10 DIAGNOSIS — J45909 Unspecified asthma, uncomplicated: Secondary | ICD-10-CM | POA: Insufficient documentation

## 2012-11-10 DIAGNOSIS — I10 Essential (primary) hypertension: Secondary | ICD-10-CM | POA: Insufficient documentation

## 2012-11-10 DIAGNOSIS — E669 Obesity, unspecified: Secondary | ICD-10-CM | POA: Insufficient documentation

## 2012-11-10 DIAGNOSIS — Z170 Estrogen receptor positive status [ER+]: Secondary | ICD-10-CM

## 2012-11-10 DIAGNOSIS — R209 Unspecified disturbances of skin sensation: Secondary | ICD-10-CM

## 2012-11-10 LAB — CBC WITH DIFFERENTIAL/PLATELET
Basophils Absolute: 0 10*3/uL (ref 0.0–0.1)
Basophils Relative: 0 % (ref 0–1)
Eosinophils Relative: 0 % (ref 0–5)
HCT: 43.3 % (ref 36.0–46.0)
MCHC: 30.7 g/dL (ref 30.0–36.0)
Monocytes Absolute: 0.3 10*3/uL (ref 0.1–1.0)
Neutro Abs: 4.9 10*3/uL (ref 1.7–7.7)
Platelets: 184 10*3/uL (ref 150–400)
RDW: 15.4 % (ref 11.5–15.5)
WBC: 5.8 10*3/uL (ref 4.0–10.5)

## 2012-11-10 LAB — COMPREHENSIVE METABOLIC PANEL
ALT: 9 U/L (ref 0–35)
AST: 12 U/L (ref 0–37)
Albumin: 3.6 g/dL (ref 3.5–5.2)
Calcium: 9.4 mg/dL (ref 8.4–10.5)
Chloride: 97 mEq/L (ref 96–112)
Creatinine, Ser: 0.52 mg/dL (ref 0.50–1.10)
Sodium: 139 mEq/L (ref 135–145)

## 2012-11-10 MED ORDER — DENOSUMAB 60 MG/ML ~~LOC~~ SOLN
60.0000 mg | Freq: Once | SUBCUTANEOUS | Status: AC
  Administered 2012-11-10: 60 mg via SUBCUTANEOUS
  Filled 2012-11-10: qty 1

## 2012-11-10 MED ORDER — EXEMESTANE 25 MG PO TABS: 25.0000 mg | ORAL_TABLET | Freq: Every day | ORAL | Status: DC

## 2012-11-10 NOTE — Progress Notes (Signed)
[name] presents today for injection per MD orders. Prolia 60 mg administered SQ in left Abdomen. Administration without incident. Patient tolerated well.  

## 2012-11-10 NOTE — Progress Notes (Signed)
prolia given at MD visit today.

## 2012-11-10 NOTE — Progress Notes (Signed)
Problem #1 stage I (T1 C., N0), grade 1, infiltrating ductal carcinoma the right breast, well differentiated, occurring at the 12:00 position with 2 negative sentinel lymph nodes. As receptors were 100%, progesterone separate 90%, HER-2/neu was 0, and her Ki-67 marker was low at 9%. She was unable to have radiation therapy following surgery on 03/20/2007. We placed her on Aromasin 25 mg once a day and she took that for 5 full years but more recently has been skipping it perhaps every other day every third day. She would like to quit the drug if possible but she blames the drug on her leg being now but is only one leg. She is however willing to try one more time for one-month once a day and she will call us or her sister it I should say will call us to let us know what happens. If she is having trouble trouble stop it altogether. She is however at higher risk of recurrence because she was unable to have radiation therapy. Problem #2 asthma x13-14 years with clear lungs at this time. Problem #3 cervical pulses since childhood unable to remain still for radiation therapy Problem #4 obesity Problem #5 hypertension Problem #6 hypothyroidism Problem #7 CVA in 2007 Problem #8 fractured right arm Easter 2001 Problem #9 osteoporosis and we are giving her denosumab every 6 months along with her calcium and vitamin D  She is doing well. She looks very stable. Weight is higher than it was a year ago by approximately 13 pounds. She is in good spirits so. She is accompanied by her sister. She has no other complaints other than his leg numbness. She has low lumbar he sometimes living by herself. Her exam no shows clear lung fields today. I cannot feel her liver edge. She does not have obvious ankle edema. She does not have arm edema. She has no lymphadenopathy in the axillary areas infraclavicular areas Celexa areas cervical areas. Both breasts are negative for masses. Heart shows no murmur rub or gallop. She looks  stable we will hear from him in one month otherwise we'll see him in 6 months.

## 2012-11-10 NOTE — Patient Instructions (Addendum)
[facility]  Discharge Instructions  RECOMMENDATIONS MADE BY THE CONSULTANT AND ANY TEST RESULTS WILL BE SENT TO YOUR REFERRING DOCTOR.   EXAM FINDINGS BY MD TODAY AND SIGNS AND SYMPTOMS TO REPORT TO CLINIC OR PRIMARY MD: exam and discussion by MD.  He wants you to try the aromasin for at least a month and call us and let us know how you are doing.  MEDICATIONS PRESCRIBED: aromasin take 1 daily - e-scribed to your pharmacy. Follow label directions  INSTRUCTIONS GIVEN AND DISCUSSED: Other :  We will give you your prolia today, your blood counts are fine.  SPECIAL INSTRUCTIONS/FOLLOW-UP: Lab work Needed in 6 months prior to your prolia injection and Return to Clinic in 6 months.   I acknowledge that I have been informed and understand all the instructions given to me and received a copy. I do not have any more questions at this time, but understand that I may call the [facility] at [facility] at [phone] during business hours should I have any further questions or need assistance in obtaining follow-up care.    __________________________________________  _____________  __________ Signature of Patient or Authorized Representative            Date                   Time    __________________________________________ Nurse's Signature

## 2012-11-29 ENCOUNTER — Ambulatory Visit (HOSPITAL_COMMUNITY)
Admission: RE | Admit: 2012-11-29 | Discharge: 2012-11-29 | Disposition: A | Discharge status: Home / Self Care | Payer: Medicare Other | Source: Ambulatory Visit | Attending: Oncology | Admitting: Oncology

## 2012-11-29 DIAGNOSIS — C50919 Malignant neoplasm of unspecified site of unspecified female breast: Secondary | ICD-10-CM

## 2012-11-29 DIAGNOSIS — Z09 Encounter for follow-up examination after completed treatment for conditions other than malignant neoplasm: Secondary | ICD-10-CM | POA: Insufficient documentation

## 2012-11-29 DIAGNOSIS — Z853 Personal history of malignant neoplasm of breast: Secondary | ICD-10-CM | POA: Insufficient documentation

## 2013-03-02 ENCOUNTER — Other Ambulatory Visit (HOSPITAL_COMMUNITY): Payer: Self-pay | Admitting: Oncology

## 2013-05-11 ENCOUNTER — Other Ambulatory Visit (HOSPITAL_COMMUNITY): Payer: Medicare Other

## 2013-05-11 ENCOUNTER — Ambulatory Visit (HOSPITAL_COMMUNITY): Payer: Medicare Other

## 2013-05-22 ENCOUNTER — Other Ambulatory Visit (HOSPITAL_COMMUNITY): Payer: Self-pay | Admitting: Oncology

## 2013-05-23 ENCOUNTER — Encounter (HOSPITAL_COMMUNITY): Payer: Self-pay | Admitting: Oncology

## 2013-05-23 ENCOUNTER — Other Ambulatory Visit (HOSPITAL_COMMUNITY): Payer: Self-pay | Admitting: Oncology

## 2013-05-25 ENCOUNTER — Encounter (HOSPITAL_BASED_OUTPATIENT_CLINIC_OR_DEPARTMENT_OTHER): Payer: Medicare Other

## 2013-05-25 ENCOUNTER — Encounter (HOSPITAL_COMMUNITY): Payer: Medicare Other

## 2013-05-25 ENCOUNTER — Encounter (HOSPITAL_COMMUNITY): Payer: Self-pay

## 2013-05-25 ENCOUNTER — Encounter (HOSPITAL_COMMUNITY): Payer: Medicare Other | Attending: Hematology and Oncology

## 2013-05-25 VITALS — BP 138/78 | HR 97 | Temp 98.1°F | Resp 20 | Wt 231.0 lb

## 2013-05-25 DIAGNOSIS — M810 Age-related osteoporosis without current pathological fracture: Secondary | ICD-10-CM | POA: Insufficient documentation

## 2013-05-25 DIAGNOSIS — E669 Obesity, unspecified: Secondary | ICD-10-CM

## 2013-05-25 DIAGNOSIS — C50919 Malignant neoplasm of unspecified site of unspecified female breast: Secondary | ICD-10-CM

## 2013-05-25 DIAGNOSIS — Z09 Encounter for follow-up examination after completed treatment for conditions other than malignant neoplasm: Secondary | ICD-10-CM | POA: Insufficient documentation

## 2013-05-25 DIAGNOSIS — Z853 Personal history of malignant neoplasm of breast: Secondary | ICD-10-CM | POA: Insufficient documentation

## 2013-05-25 DIAGNOSIS — G809 Cerebral palsy, unspecified: Secondary | ICD-10-CM

## 2013-05-25 DIAGNOSIS — C50911 Malignant neoplasm of unspecified site of right female breast: Secondary | ICD-10-CM

## 2013-05-25 DIAGNOSIS — Z170 Estrogen receptor positive status [ER+]: Secondary | ICD-10-CM

## 2013-05-25 LAB — CBC WITH DIFFERENTIAL/PLATELET
Eosinophils Absolute: 0 10*3/uL (ref 0.0–0.7)
Eosinophils Relative: 0 % (ref 0–5)
Hemoglobin: 13.4 g/dL (ref 12.0–15.0)
Lymphs Abs: 0.6 10*3/uL — ABNORMAL LOW (ref 0.7–4.0)
MCH: 27.4 pg (ref 26.0–34.0)
MCV: 91.6 fL (ref 78.0–100.0)
Monocytes Absolute: 0.4 10*3/uL (ref 0.1–1.0)
Monocytes Relative: 7 % (ref 3–12)
RBC: 4.89 MIL/uL (ref 3.87–5.11)

## 2013-05-25 LAB — COMPREHENSIVE METABOLIC PANEL
Alkaline Phosphatase: 58 U/L (ref 39–117)
BUN: 15 mg/dL (ref 6–23)
Calcium: 9.1 mg/dL (ref 8.4–10.5)
Creatinine, Ser: 0.6 mg/dL (ref 0.50–1.10)
GFR calc Af Amer: >90 mL/min (ref >90)
Glucose, Bld: 112 mg/dL — ABNORMAL HIGH (ref 70–99)
Total Protein: 6.6 g/dL (ref 6.0–8.3)

## 2013-05-25 MED ORDER — DENOSUMAB 60 MG/ML ~~LOC~~ SOLN
60.0000 mg | Freq: Once | SUBCUTANEOUS | Status: AC
  Administered 2013-05-25: 60 mg via SUBCUTANEOUS
  Filled 2013-05-25: qty 1

## 2013-05-25 NOTE — Progress Notes (Signed)
[facility] [phone]   [phone]  OFFICE PROGRESS NOTE  Fredirick Maudlin, MD [address] [place] [address]  DIAGNOSIS: stage I (T1c., N0, M0 ), grade 1, infiltrating ductal carcinoma the right breast.  ONCOLOGIC HISTORY: Per Dellis Anes PA-C note;  [place] 72 y.o. female returns for regular visit for follow up of Stage I (T1c N0), grade 1 infiltrating ductal carcinoma of the right breast, well-differentiated, occurring at the 12 o'clock position with 2 negative sentinel nodes. Estrogen receptor was 100%, progesterone receptor was 90%, HER-2/neu was 0, and her Ki-67 marker was low at 9%, HER-2 was negative as mentioned. She is presently on Aromasin 25 mg once a day. She had her surgery on 03/20/2007 as far as the sentinel nodes were concerned. She was unable to take radiation due to her cerebral palsy and uncontrolled movements, so we will continue Aromasin probably for 5 to 10 years.   INTERVAL HISTORY:   KAYCE CHISMAR 72 y.o. female returns to the clinic today for scheduled follow up of stage I breast cancer diagnosis 02/13/2007.she was not treated with radiation due to reasons mentioned above.  She began Aromasin on 04/12/2007 has continued to date. Her most recent DEXA scan,was in May of 2013 and this showed osteoporosis involving the femora with T score ranging from -3.3 to -2.8.  She was began on Prolia , calcium and vitamin D.  She is accompanied by her sister Lorinda Creed.  Patient tells me that she feels well and denies any change in shortness of breath breast lumps or appetite.  Her hot flashes are unchanged. She is concerned about her weight and asked me if I could prescribe a pill to help her lose weight .  She did admit to dietary indiscretion and eats a  lot of sweets and calorically dense food.  MEDICAL HISTORY: Past Medical History  Diagnosis Date  . Cerebral palsy   . Asthma   . Seasonal allergies   . Thyroid  disease   . HTN (hypertension)   . Anxiety   . Arthritis   . Breast cancer   . Anginal pain   . Osteoporosis 05/08/2012  . Osteoporosis 05/08/2012    ALLERGIES:  is allergic to naproxen.  MEDICATIONS:  Current Outpatient Prescriptions  Medication Sig Dispense Refill  . albuterol (PROVENTIL HFA;VENTOLIN HFA) 108 (90 BASE) MCG/ACT inhaler Inhale 2 puffs into the lungs every 6 (six) hours as needed. Shortness of breath      . ALPRAZolam (XANAX) 0.5 MG tablet Take 0.25 mg by mouth every 4 (four) hours. nerves      . aspirin 325 MG tablet Take 325 mg by mouth daily.        . Calcium Carbonate-Vitamin D (CALCIUM 600 + D PO) Take 1 tablet by mouth daily.        . clopidogrel (PLAVIX) 75 MG tablet Take 1 tablet (75 mg total) by mouth daily.  30 tablet  5  . EFFERVESCENT POT CHLORIDE, 25, 25 MEQ TBEF Take 25 mEq by mouth 3 (three) times daily.       Marland Kitchen exemestane (AROMASIN) 25 MG tablet Take 1 tablet (25 mg total) by mouth daily after breakfast.  30 tablet  6  . levothyroxine (SYNTHROID, LEVOTHROID) 88 MCG tablet Take 88 mcg by mouth daily.      Marland Kitchen losartan-hydrochlorothiazide (HYZAAR) 50-12.5 MG per tablet Take 1 tablet by mouth daily.        Marland Kitchen  magnesium sulfate (EPSOM SALTS) crystals Take by mouth as needed. If very constipated      . predniSONE (STERAPRED UNI-PAK) 10 MG tablet Take 5 mg by mouth daily.       . traMADol (ULTRAM) 50 MG tablet Take 100 mg by mouth every 6 (six) hours as needed. Maximum dose= 8 tablets per day pain      . triamcinolone cream (KENALOG) 0.1 % Apply 1 application topically 2 (two) times daily as needed. Apply to affected areas      . gabapentin (NEURONTIN) 100 MG capsule Take 1 capsule (100 mg total) by mouth 3 (three) times daily.  90 capsule  2   Current Facility-Administered Medications  Medication Dose Route Frequency Provider Last Rate Last Dose  . denosumab (PROLIA) injection 60 mg  60 mg Subcutaneous Once Thomas S Kefalas, PA-C      . methylPREDNISolone  acetate (DEPO-MEDROL) injection 40 mg  40 mg Intra-articular Once Vickki Hearing, MD        SURGICAL HISTORY:  Past Surgical History  Procedure Laterality Date  . Breast lumpectomy    . Radical abdominal hysterectomy    . Dental extraction    . Right hand surgery       REVIEW OF SYSTEMS: 14 point review of system is as in the history above otherwise negative.    PHYSICAL EXAMINATION:  Blood pressure 138/78, pulse 97, temperature 98.1 F (36.7 C), temperature source Oral, resp. rate 20, weight 231 lb (104.781 kg). GENERAL: No distress, sincerely obese. Wheelchair confined. SKIN:  No rashes or significant lesions  EYES: Conjunctiva are pink and non-injected  ENT: External ears normal ,lips, buccal mucosa, and tongue normal and mucous membranes are moist  LYMPH: No palpable lymphadenopathy, in the neck supraclavicular or axillary lymph node areas. BREAST:there are no palpable masses on both breasts right breast lumpectomy scar well-healed. LUNGS: markedly decreased breath sounds bilaterally, no crackles or  significant wheezing HEART: regular rate & rhythm, no murmurs, no gallops, S1 normal and S2 normal  ABDOMEN: Abdomen soft, non-tender, normal bowel sounds, no masses or organomegaly and no hepatosplenomegaly  MSK: No CVA tenderness and no tenderness on percussion of the back or rib cage. EXTREMITIES: No edema, no skin discoloration or tenderness NEURO: Alert & oriented , choreiform movements with some  Spasticity involving mostly the upper extremities.     LABORATORY DATA: Lab Results  Component Value Date   WBC 5.6 05/25/2013   HGB 13.4 05/25/2013   HCT 44.8 05/25/2013   MCV 91.6 05/25/2013   PLT 161 05/25/2013      Chemistry      Component Value Date/Time   NA 141 05/25/2013 1132   K 4.2 05/25/2013 1132   CL 97 05/25/2013 1132   CO2 38* 05/25/2013 1132   BUN 15 05/25/2013 1132   CREATININE 0.60 05/25/2013 1132      Component Value Date/Time   CALCIUM 9.1 05/25/2013 1132    ALKPHOS 58 05/25/2013 1132   AST 13 05/25/2013 1132   ALT 7 05/25/2013 1132   BILITOT 0.3 05/25/2013 1132       RADIOGRAPHIC STUDIES: No results found.   ASSESSMENT:  Stage I breast cancer status post surgical resection and 7 years of aromatase inhibitor therapy.  Patient has severe osteoporosis. She also has a background of cerebral palsy with chronic debility which could be contributing to the osteoporosis in addition to the aromatase inhibitor, race and age.  Additionally she is morbidly obese which somewhat protects against  osteoporosis.  PLAN:  1. I will suggest to the primary oncology team, to discontinue Aromasin at this stage giving that there is no data to support more than 5 years of therapy available at this time, especially considering that patient has severe osteoporosis which is likely to get worse.  However I will leave this to the discretion of her primary oncologist team.I discussed this recommendation with Dellis Anes PA-C to mention this to Dr Mariel Sleet. 2. Patient would continue Prolia. 3. She'll return to clinic in 6 months. 4. Routine mammogram was ordered for December 2014. 5.  Patient was counseled on weight loss with emphasis on healthy eating habits.    All questions were satisfactorily answered.She knows to call if she has any concern.  I spent 15 minutes counseling the patient face to face. The total time spent in the appointment was 30 minutes.   Sherral Hammers, MD FACP. Hematology/Oncology.

## 2013-05-25 NOTE — Patient Instructions (Addendum)
[facility] Discharge Instructions  RECOMMENDATIONS MADE BY THE CONSULTANT AND ANY TEST RESULTS WILL BE SENT TO YOUR REFERRING PHYSICIAN.  You are doing well. Prolia injection today. We will continue to do lab work and Prolia injection every 6 months. Return to see MD in 6 months. Report any issues/concerns to clinic as needed prior to appointments.  Thank you for choosing [facility] to provide your oncology and hematology care.  To afford each patient quality time with our providers, please arrive at least 15 minutes before your scheduled appointment time.  With your help, our goal is to use those 15 minutes to complete the necessary work-up to ensure our physicians have the information they need to help with your evaluation and healthcare recommendations.    Effective January 1st, 2014, we ask that you re-schedule your appointment with our physicians should you arrive 10 or more minutes late for your appointment.  We strive to give you quality time with our providers, and arriving late affects you and other patients whose appointments are after yours.    Again, thank you for choosing [facility].  Our hope is that these requests will decrease the amount of time that you wait before being seen by our physicians.       _____________________________________________________________  Should you have questions after your visit to [facility], please contact our office at [phone] between the hours of 8:30 a.m. and 5:00 p.m.  Voicemails left after 4:30 p.m. will not be returned until the following business day.  For prescription refill requests, have your pharmacy contact our office with your prescription refill request.     Calorie Counting Diet A calorie counting diet requires you to eat the number of calories that are right for you in a day. Calories are the measurement of how much energy you get from the food you eat. Eating the  right amount of calories is important for staying at a healthy weight. If you eat too many calories, your body will store them as fat and you may gain weight. If you eat too few calories, you may lose weight. Counting the number of calories you eat during a day will help you know if you are eating the right amount. A Registered Dietitian can determine how many calories you need in a day. The amount of calories needed varies from person to person. If your goal is to lose weight, you will need to eat fewer calories. Losing weight can benefit you if you are overweight or have health problems such as heart disease, high blood pressure, or diabetes. If your goal is to gain weight, you will need to eat more calories. Gaining weight may be necessary if you have a certain health problem that causes your body to need more energy. TIPS Whether you are increasing or decreasing the number of calories you eat during a day, it may be hard to get used to changes in what you eat and drink. The following are tips to help you keep track of the number of calories you eat.  Measure foods at home with measuring cups. This helps you know the amount of food and number of calories you are eating.  Restaurants often serve food in amounts that are larger than 1 serving. While eating out, estimate how many servings of a food you are given. For example, a serving of cooked rice is  cup or about the size of half of a fist.  Knowing serving sizes will help you be aware of how much food you are eating at restaurants.  Ask for smaller portion sizes or child-size portions at restaurants.  Plan to eat half of a meal at a restaurant. Take the rest home or share the other half with a friend.  Read the Nutrition Facts panel on food labels for calorie content and serving size. You can find out how many servings are in a package, the size of a serving, and the number of calories each serving has.  For example, a package might contain 3  cookies. The Nutrition Facts panel on that package says that 1 serving is 1 cookie. Below that, it will say there are 3 servings in the container. The calories section of the Nutrition Facts label says there are 90 calories. This means there are 90 calories in 1 cookie (1 serving). If you eat 1 cookie you have eaten 90 calories. If you eat all 3 cookies, you have eaten 270 calories (3 servings x 90 calories = 270 calories). The list below tells you how big or small some common portion sizes are.  1 oz.........4 stacked dice.  3 oz........Marland KitchenDeck of cards.  1 tsp.......Marland KitchenTip of little finger.  1 tbs......Marland KitchenMarland KitchenThumb.  2 tbs.......Marland KitchenGolf ball.   cup......Marland KitchenHalf of a fist.  1 cup.......Marland KitchenA fist. KEEP A FOOD LOG Write down every food item you eat, the amount you eat, and the number of calories in each food you eat during the day. At the end of the day, you can add up the total number of calories you have eaten. It may help to keep a list like the one below. Find out the calorie information by reading the Nutrition Facts panel on food labels. Breakfast  Bran cereal (1 cup, 110 calories).  Fat-free milk ( cup, 45 calories). Snack  Apple (1 medium, 80 calories). Lunch  Spinach (1 cup, 20 calories).  Tomato ( medium, 20 calories).  Chicken breast strips (3 oz, 165 calories).  Shredded cheddar cheese ( cup, 110 calories).  Light [place] dressing (2 tbs, 60 calories).  Whole-wheat bread (1 slice, 80 calories).  Tub margarine (1 tsp, 35 calories).  Vegetable soup (1 cup, 160 calories). Dinner  Pork chop (3 oz, 190 calories).  Brown rice (1 cup, 215 calories).  Steamed broccoli ( cup, 20 calories).  Strawberries (1  cup, 65 calories).  Whipped cream (1 tbs, 50 calories). Daily Calorie Total: 1425 Document Released: 12/06/2005 Document Revised: 02/28/2012 Document Reviewed: 06/02/2007 [facility] Patient Information 2014 [place], [place].

## 2013-05-25 NOTE — Progress Notes (Signed)
Labs drawn today for cbc/diff,cmp 

## 2013-05-25 NOTE — Progress Notes (Signed)
[name] presents today for injection per MD orders. Prolia 60 mg administered SQ in right Abdomen. Administration without incident. Patient tolerated well.

## 2013-06-11 ENCOUNTER — Observation Stay (HOSPITAL_COMMUNITY)
Admission: EM | Admit: 2013-06-11 | Discharge: 2013-06-13 | Disposition: A | Discharge status: Skilled Nursing Facility | Payer: Medicare Other | Attending: Pulmonary Disease | Admitting: Pulmonary Disease

## 2013-06-11 ENCOUNTER — Observation Stay (HOSPITAL_COMMUNITY): Payer: Medicare Other

## 2013-06-11 ENCOUNTER — Emergency Department (HOSPITAL_COMMUNITY): Payer: Medicare Other

## 2013-06-11 ENCOUNTER — Encounter (HOSPITAL_COMMUNITY): Payer: Self-pay

## 2013-06-11 DIAGNOSIS — J4489 Other specified chronic obstructive pulmonary disease: Secondary | ICD-10-CM | POA: Insufficient documentation

## 2013-06-11 DIAGNOSIS — IMO0002 Reserved for concepts with insufficient information to code with codable children: Secondary | ICD-10-CM | POA: Diagnosis present

## 2013-06-11 DIAGNOSIS — E039 Hypothyroidism, unspecified: Secondary | ICD-10-CM | POA: Diagnosis present

## 2013-06-11 DIAGNOSIS — Z6841 Body Mass Index (BMI) 40.0 and over, adult: Secondary | ICD-10-CM | POA: Insufficient documentation

## 2013-06-11 DIAGNOSIS — D696 Thrombocytopenia, unspecified: Secondary | ICD-10-CM | POA: Diagnosis present

## 2013-06-11 DIAGNOSIS — J9611 Chronic respiratory failure with hypoxia: Secondary | ICD-10-CM | POA: Diagnosis present

## 2013-06-11 DIAGNOSIS — G809 Cerebral palsy, unspecified: Secondary | ICD-10-CM | POA: Diagnosis present

## 2013-06-11 DIAGNOSIS — I10 Essential (primary) hypertension: Secondary | ICD-10-CM | POA: Diagnosis present

## 2013-06-11 DIAGNOSIS — J449 Chronic obstructive pulmonary disease, unspecified: Secondary | ICD-10-CM | POA: Insufficient documentation

## 2013-06-11 DIAGNOSIS — Z79899 Other long term (current) drug therapy: Secondary | ICD-10-CM | POA: Insufficient documentation

## 2013-06-11 DIAGNOSIS — F3289 Other specified depressive episodes: Secondary | ICD-10-CM | POA: Insufficient documentation

## 2013-06-11 DIAGNOSIS — Z8673 Personal history of transient ischemic attack (TIA), and cerebral infarction without residual deficits: Secondary | ICD-10-CM | POA: Insufficient documentation

## 2013-06-11 DIAGNOSIS — F411 Generalized anxiety disorder: Secondary | ICD-10-CM | POA: Insufficient documentation

## 2013-06-11 DIAGNOSIS — F329 Major depressive disorder, single episode, unspecified: Secondary | ICD-10-CM | POA: Insufficient documentation

## 2013-06-11 DIAGNOSIS — R609 Edema, unspecified: Secondary | ICD-10-CM | POA: Insufficient documentation

## 2013-06-11 DIAGNOSIS — I369 Nonrheumatic tricuspid valve disorder, unspecified: Secondary | ICD-10-CM

## 2013-06-11 DIAGNOSIS — R079 Chest pain, unspecified: Principal | ICD-10-CM | POA: Insufficient documentation

## 2013-06-11 DIAGNOSIS — R600 Localized edema: Secondary | ICD-10-CM | POA: Diagnosis present

## 2013-06-11 DIAGNOSIS — R062 Wheezing: Secondary | ICD-10-CM | POA: Diagnosis present

## 2013-06-11 DIAGNOSIS — J45909 Unspecified asthma, uncomplicated: Secondary | ICD-10-CM | POA: Diagnosis present

## 2013-06-11 DIAGNOSIS — J9610 Chronic respiratory failure, unspecified whether with hypoxia or hypercapnia: Secondary | ICD-10-CM | POA: Insufficient documentation

## 2013-06-11 DIAGNOSIS — G803 Athetoid cerebral palsy: Secondary | ICD-10-CM | POA: Insufficient documentation

## 2013-06-11 DIAGNOSIS — R0902 Hypoxemia: Secondary | ICD-10-CM

## 2013-06-11 DIAGNOSIS — M1710 Unilateral primary osteoarthritis, unspecified knee: Secondary | ICD-10-CM | POA: Insufficient documentation

## 2013-06-11 HISTORY — DX: Reserved for concepts with insufficient information to code with codable children: IMO0002

## 2013-06-11 HISTORY — DX: Other forms of angina pectoris: I20.8

## 2013-06-11 HISTORY — DX: Other forms of angina pectoris: I20.89

## 2013-06-11 HISTORY — DX: Cerebral infarction, unspecified: I63.9

## 2013-06-11 LAB — PRO B NATRIURETIC PEPTIDE: Pro B Natriuretic peptide (BNP): 67.7 pg/mL (ref 0–125)

## 2013-06-11 LAB — TROPONIN I
Troponin I: <0.3 ng/mL (ref <0.30)
Troponin I: <0.3 ng/mL (ref <0.30)
Troponin I: <0.3 ng/mL (ref <0.30)

## 2013-06-11 LAB — CBC WITH DIFFERENTIAL/PLATELET
Basophils Absolute: 0 10*3/uL (ref 0.0–0.1)
HCT: 43 % (ref 36.0–46.0)
Lymphocytes Relative: 13 % (ref 12–46)
Monocytes Absolute: 0.3 10*3/uL (ref 0.1–1.0)
Neutro Abs: 5 10*3/uL (ref 1.7–7.7)
RDW: 16.3 % — ABNORMAL HIGH (ref 11.5–15.5)
WBC: 6.1 10*3/uL (ref 4.0–10.5)

## 2013-06-11 LAB — BASIC METABOLIC PANEL
CO2: 38 mEq/L — ABNORMAL HIGH (ref 19–32)
Chloride: 96 mEq/L (ref 96–112)
Creatinine, Ser: 0.49 mg/dL — ABNORMAL LOW (ref 0.50–1.10)
Sodium: 139 mEq/L (ref 135–145)

## 2013-06-11 LAB — D-DIMER, QUANTITATIVE: D-Dimer, Quant: 0.48 ug/mL-FEU (ref 0.00–0.48)

## 2013-06-11 MED ORDER — EXEMESTANE 25 MG PO TABS
25.0000 mg | ORAL_TABLET | Freq: Every day | ORAL | Status: DC
  Administered 2013-06-12 – 2013-06-13 (×2): 25 mg via ORAL
  Filled 2013-06-11 (×4): qty 1

## 2013-06-11 MED ORDER — DIPHENHYDRAMINE HCL 25 MG PO CAPS
25.0000 mg | ORAL_CAPSULE | Freq: Every day | ORAL | Status: DC
  Administered 2013-06-11 – 2013-06-12 (×2): 25 mg via ORAL
  Filled 2013-06-11 (×2): qty 1

## 2013-06-11 MED ORDER — GUAIFENESIN-DM 100-10 MG/5ML PO SYRP: 5.0000 mL | ORAL_SOLUTION | ORAL | Status: DC | PRN

## 2013-06-11 MED ORDER — REGADENOSON 0.4 MG/5ML IV SOLN
0.4000 mg | Freq: Once | INTRAVENOUS | Status: DC
  Filled 2013-06-11 (×3): qty 5

## 2013-06-11 MED ORDER — HYDROCHLOROTHIAZIDE 12.5 MG PO CAPS
12.5000 mg | ORAL_CAPSULE | Freq: Every day | ORAL | Status: DC
  Administered 2013-06-12 – 2013-06-13 (×2): 12.5 mg via ORAL
  Filled 2013-06-11 (×2): qty 1

## 2013-06-11 MED ORDER — ALPRAZOLAM 0.5 MG PO TABS
0.5000 mg | ORAL_TABLET | Freq: Four times a day (QID) | ORAL | Status: DC
  Administered 2013-06-11 – 2013-06-12 (×7): 0.5 mg via ORAL
  Filled 2013-06-11 (×7): qty 1

## 2013-06-11 MED ORDER — PREDNISONE 10 MG PO TABS
5.0000 mg | ORAL_TABLET | Freq: Every day | ORAL | Status: DC
  Administered 2013-06-11 – 2013-06-13 (×3): 5 mg via ORAL
  Filled 2013-06-11 (×3): qty 1

## 2013-06-11 MED ORDER — PANTOPRAZOLE SODIUM 40 MG PO TBEC
40.0000 mg | DELAYED_RELEASE_TABLET | Freq: Every day | ORAL | Status: DC
  Administered 2013-06-11 – 2013-06-13 (×3): 40 mg via ORAL
  Filled 2013-06-11 (×3): qty 1

## 2013-06-11 MED ORDER — ONDANSETRON HCL 4 MG/2ML IJ SOLN: 4.0000 mg | Freq: Four times a day (QID) | INTRAMUSCULAR | Status: DC | PRN

## 2013-06-11 MED ORDER — ACETAMINOPHEN 325 MG PO TABS: 650.0000 mg | ORAL_TABLET | Freq: Four times a day (QID) | ORAL | Status: DC | PRN

## 2013-06-11 MED ORDER — LOSARTAN POTASSIUM 50 MG PO TABS
50.0000 mg | ORAL_TABLET | Freq: Every day | ORAL | Status: DC
  Administered 2013-06-12 – 2013-06-13 (×2): 50 mg via ORAL
  Filled 2013-06-11 (×2): qty 1

## 2013-06-11 MED ORDER — MORPHINE SULFATE 4 MG/ML IJ SOLN
4.0000 mg | INTRAMUSCULAR | Status: DC | PRN
  Filled 2013-06-11: qty 1

## 2013-06-11 MED ORDER — POTASSIUM CHLORIDE CRYS ER 20 MEQ PO TBCR
20.0000 meq | EXTENDED_RELEASE_TABLET | Freq: Every day | ORAL | Status: DC
  Administered 2013-06-11 – 2013-06-13 (×3): 20 meq via ORAL
  Filled 2013-06-11 (×3): qty 1

## 2013-06-11 MED ORDER — NITROGLYCERIN 0.4 MG SL SUBL: 0.4000 mg | SUBLINGUAL_TABLET | SUBLINGUAL | Status: DC | PRN

## 2013-06-11 MED ORDER — DOCUSATE SODIUM 100 MG PO CAPS
100.0000 mg | ORAL_CAPSULE | Freq: Two times a day (BID) | ORAL | Status: DC
  Administered 2013-06-11 – 2013-06-13 (×5): 100 mg via ORAL
  Filled 2013-06-11 (×5): qty 1

## 2013-06-11 MED ORDER — BIOTENE DRY MOUTH MT LIQD
15.0000 mL | Freq: Two times a day (BID) | OROMUCOSAL | Status: DC
  Administered 2013-06-11 – 2013-06-13 (×4): 15 mL via OROMUCOSAL

## 2013-06-11 MED ORDER — TRAMADOL HCL 50 MG PO TABS
50.0000 mg | ORAL_TABLET | Freq: Four times a day (QID) | ORAL | Status: DC | PRN
  Administered 2013-06-12: 50 mg via ORAL
  Filled 2013-06-11: qty 1

## 2013-06-11 MED ORDER — ALUM & MAG HYDROXIDE-SIMETH 200-200-20 MG/5ML PO SUSP: 30.0000 mL | Freq: Four times a day (QID) | ORAL | Status: DC | PRN

## 2013-06-11 MED ORDER — LOSARTAN POTASSIUM-HCTZ 50-12.5 MG PO TABS: 1.0000 | ORAL_TABLET | Freq: Every day | ORAL | Status: DC

## 2013-06-11 MED ORDER — ENOXAPARIN SODIUM 40 MG/0.4ML ~~LOC~~ SOLN
40.0000 mg | SUBCUTANEOUS | Status: DC
  Administered 2013-06-11 – 2013-06-13 (×3): 40 mg via SUBCUTANEOUS
  Filled 2013-06-11 (×3): qty 0.4

## 2013-06-11 MED ORDER — ALBUTEROL SULFATE (5 MG/ML) 0.5% IN NEBU
2.5000 mg | INHALATION_SOLUTION | Freq: Four times a day (QID) | RESPIRATORY_TRACT | Status: DC
  Administered 2013-06-11 – 2013-06-12 (×3): 2.5 mg via RESPIRATORY_TRACT
  Filled 2013-06-11 (×3): qty 0.5

## 2013-06-11 MED ORDER — ACETAMINOPHEN 650 MG RE SUPP: 650.0000 mg | Freq: Four times a day (QID) | RECTAL | Status: DC | PRN

## 2013-06-11 MED ORDER — ASPIRIN 325 MG PO TABS
325.0000 mg | ORAL_TABLET | Freq: Every day | ORAL | Status: DC
  Administered 2013-06-12 – 2013-06-13 (×2): 325 mg via ORAL
  Filled 2013-06-11 (×2): qty 1

## 2013-06-11 MED ORDER — LEVOTHYROXINE SODIUM 88 MCG PO TABS
88.0000 ug | ORAL_TABLET | Freq: Every day | ORAL | Status: DC
  Administered 2013-06-11 – 2013-06-13 (×3): 88 ug via ORAL
  Filled 2013-06-11 (×5): qty 1

## 2013-06-11 MED ORDER — POTASSIUM CHLORIDE IN NACL 20-0.9 MEQ/L-% IV SOLN
INTRAVENOUS | Status: DC
  Administered 2013-06-11 – 2013-06-13 (×3): via INTRAVENOUS

## 2013-06-11 MED ORDER — ONDANSETRON HCL 4 MG PO TABS: 4.0000 mg | ORAL_TABLET | Freq: Four times a day (QID) | ORAL | Status: DC | PRN

## 2013-06-11 NOTE — ED Provider Notes (Signed)
History     This chart was scribed for Hurman Horn, MD, MD by Smitty Pluck, ED Scribe. The patient was seen in room APA19/APA19 and the patient's care was started at 7:55 AM.   CSN: [id number]  Arrival date & time 06/11/13  0747    Chief Complaint  Patient presents with  . Chest Pain    The history is provided by the patient and medical records. No language interpreter was used.   HPI Comments: [name] is a 72 y.o. female with hx of cerebral palsy, asthma, stroke and breast cancer who presents to the Emergency Department BIB EMS complaining of intermittent, moderate chest pain onset 1 week ago. She reports that today the symptoms worsened and remained constant since 5AM today. She states she has taken 324mg  asa (given via EMS) with minor relief. She reports that the chest pain last for 1 hour when it occurs. Pt states it is hard to describe the way the chest pain feels. She mentions that she had left arm numbness, SOB and nausea. She mentions that the pain radiated to her left arm. Pt denies fever, chills, vomiting, diarrhea, blood in stool, abdominal pain, weakness, cough and any other pain. Pain free in ED.  She denies hx of MI.     Past Medical History  Diagnosis Date  . Cerebral palsy   . Asthma   . Seasonal allergies   . Thyroid disease   . HTN (hypertension)   . Anxiety   . Arthritis   . Breast cancer     Right breast, infiltrating ductal.  . Anginal pain   . Osteoporosis 05/08/2012  . Osteoporosis 05/08/2012  . Stroke   . Angina at rest   . Chronic steroid use     Past Surgical History  Procedure Laterality Date  . Breast lumpectomy    . Radical abdominal hysterectomy    . Dental extraction    . Right hand surgery      Family History  Problem Relation Age of Onset  . Cancer    . Diabetes    . Arthritis    . Asthma      History  Substance Use Topics  . Smoking status: Never Smoker   . Smokeless tobacco: Never Used  . Alcohol Use: No    OB  History   Grav Para Term Preterm Abortions TAB SAB Ect Mult Living                  Review of Systems A complete 10 system review of systems was obtained and all systems are negative except as noted in the HPI and PMH.   Allergies  Naproxen  Home Medications   No current outpatient prescriptions on file.  BP 115/45  Pulse 94  Temp(Src) 98.4 F (36.9 C) (Oral)  Resp 20  Ht 5\' 4"  (1.626 m)  Wt 236 lb 12.4 oz (107.4 kg)  BMI 40.62 kg/m2  SpO2 94%  Physical Exam  Nursing note and vitals reviewed. Constitutional:  Awake, alert, nontoxic appearance.  HENT:  Head: Atraumatic.  Eyes: Right eye exhibits no discharge. Left eye exhibits no discharge.  Neck: Neck supple.  Cardiovascular: Normal rate, regular rhythm and normal heart sounds.   No murmur heard. Pulmonary/Chest: Effort normal and breath sounds normal. No respiratory distress. She has no wheezes. She has no rales. She exhibits tenderness (mildly tender anterior chest wall that is different from chief complaint pain).  Abdominal: Soft. There is no tenderness. There  is no rebound.  Musculoskeletal: She exhibits no edema and no tenderness.  Baseline ROM, no obvious new focal weakness.  Neurological:  Mental status and motor strength appears baseline for patient and situation.  Skin: No rash noted.  Psychiatric: She has a normal mood and affect.    ED Course  Procedures (including critical care time) ECG: Sinus rhythm with premature atrial complexes, ventricular rate 93, normal axis, no acute ischemic changes noted, no significant change noted compared with July 2013  DIAGNOSTIC STUDIES:   COORDINATION OF CARE: 8:03 AM Patient / Family / Caregiver understand and agree with initial ED impression and plan with expectations set for ED visit.  9:31 AM Pt stable and remains pain free in ED with no significant deterioration in condition. Patient / Family / Caregiver informed of clinical course, understand medical  decision-making process, and agree with plan.  D/w [facility] for Obs.    Labs Reviewed  CBC WITH DIFFERENTIAL - Abnormal; Notable for the following:    RDW 16.3 (*)    Platelets 144 (*)    Neutrophils Relative % 82 (*)    All other components within normal limits  BASIC METABOLIC PANEL - Abnormal; Notable for the following:    CO2 38 (*)    Glucose, Bld 118 (*)    Creatinine, Ser 0.49 (*)    All other components within normal limits  MRSA PCR SCREENING  TROPONIN I  D-DIMER, QUANTITATIVE  TROPONIN I  TROPONIN I  PRO B NATRIURETIC PEPTIDE  TSH  VITAMIN B12  CBC  BASIC METABOLIC PANEL   US Venous Img Lower Unilateral Left  06/11/2013   *RADIOLOGY REPORT*  Clinical Data: Left lower extremity pain and edema  LEFT LOWER EXTREMITY VENOUS DUPLEX ULTRASOUND  Technique:  Gray-scale sonography with graded compression, as well as color Doppler and duplex ultrasound were performed to evaluate the deep venous system of the lower extremity from the level of the common femoral vein through the popliteal and proximal calf veins. Spectral Doppler was utilized to evaluate flow at rest and with distal augmentation maneuvers.  Comparison:  None.  Findings:  Normal compressibility of the common femoral, superficial femoral, and popliteal veins is demonstrated, as well as the visualized proximal calf veins.  No filling defects to suggest DVT on grayscale or color Doppler imaging.  Doppler waveforms show normal direction of venous flow, normal respiratory phasicity and response to augmentation.  Peripheral subcutaneous calf edema noted.  IMPRESSION: No evidence of lower extremity deep vein thrombosis.   Original Report Authenticated By: Judie Petit. Miles Costain, M.D.   Dg Chest Port 1 View  06/11/2013   *RADIOLOGY REPORT*  Clinical Data: Chest pain  PORTABLE CHEST - 1 VIEW  Comparison: 05/28/2011  Findings: Low lung volumes are present, causing crowding of the pulmonary vasculature.  Band-like opacity noted above the right  hemidiaphragm.  Indistinct retrocardiac airspace opacity observed.  Cardiomegaly observed with mild prominence of upper zone pulmonary vasculature.  IMPRESSION:  1.  Low lung volumes, with mild atelectasis along the right hemidiaphragm and nonspecific retrocardiac airspace opacity on the left, potentially from atelectasis or pneumonia. 2.  Cardiomegaly with cephalization of blood flow suggesting pulmonary venous hypertension.   Original Report Authenticated By: Gaylyn Rong, M.D.     1. Chest pain   2. Chronic respiratory failure with hypoxia   3. Chronic steroid use   4. Leg edema, left   5. Cerebral palsy   6. Hypertension   7. Morbid obesity  MDM  The patient appears reasonably stabilized for admission considering the current resources, flow, and capabilities available in the ED at this time, and I doubt any other [facility] requiring further screening and/or treatment in the ED prior to admission.      I personally performed the services described in this documentation, which was scribed in my presence. The recorded information has been reviewed and is accurate.     Hurman Horn, MD 06/11/13 2227

## 2013-06-11 NOTE — Progress Notes (Signed)
*  PRELIMINARY RESULTS* Echocardiogram 2D Echocardiogram has been performed.  [name] 06/11/2013, 3:14 PM 

## 2013-06-11 NOTE — H&P (Signed)
[name] History and Physical  [name]:[id number] DOB: [birth year] DOA: 06/11/2013  Referring physician: Dr. Fonnie Jarvis PCP: Fredirick Maudlin, MD  Specialists: None  Chief Complaint: Chest pain  HPI: [name] is a 72 y.o. female with a history significant for cerebral palsy, chronic respiratory failure with hypoxia (she uses 2 L of oxygen, mostly at night), hypertension, right-sided breast cancer, and asthma, who presents to the emergency department today with a chief complaint of chest pain. Her chest pain started approximately one week ago. It has been intermittent. It occurs with rest and with activity. It is located on the left side of her chest with some radiation to her left arm and occasional left jaw. She has had associated left arm numbness, occasional shortness of breath, occasional sweatiness, and occasional nausea. None of the associated symptoms occur consistently with chest pain. The pain at its worse is moderate in intensity. It generally lasts from a few minutes up to one hour. She takes an aspirin or tramadol which helps to relieve the pain, but not completely. She has a mild chronic cough, mostly dry, sometimes productive with tan or yellow sputum. She uses her upper extremities significantly while using the walker, and according to her sister, she holds the walker very tightly. She denies chest trauma, heavy lifting, or pleurisy. No recent fever or chills. She does have chronic swelling in her left leg. No left leg pain in particular.  In the emergency department, she is afebrile and hemodynamically stable. Her lab data are significant for normal troponin I., normal d-dimer, and platelet count of 144. Her chest x-ray reveals low lung volumes, mild atelectasis, and nonspecific retrocardiac airspace opacity; cardiomegaly, and possible pulmonary venous hypertension. Her EKG reveals sinus rhythm with PACs and a heart rate of 93 beats per minute. She is being admitted  for further evaluation and management.    Review of Systems: Review of systems positive as above in history present illness. In addition, she has chronic weakness in her arms and her legs from cerebral palsy, arthritic pain in her lower back and legs. Otherwise review of systems is negative.   Past Medical History  Diagnosis Date  . Cerebral palsy   . Asthma   . Seasonal allergies   . Thyroid disease   . HTN (hypertension)   . Anxiety   . Arthritis   . Breast cancer     Right breast, infiltrating ductal.  . Anginal pain   . Osteoporosis 05/08/2012  . Osteoporosis 05/08/2012  . Stroke   . Angina at rest   . Chronic steroid use    Past Surgical History  Procedure Laterality Date  . Breast lumpectomy    . Radical abdominal hysterectomy    . Dental extraction    . Right hand surgery     Social History: She is divorced. She has one son. She lives alone. She has an aid who comes in 7 days weekly. Her sister Mrs. Rice looks in on her. The patient generally ambulates with a walker at home and uses a wheelchair when she leaves home. She denies tobacco, alcohol, and illicit drug use.   Allergies  Allergen Reactions  . Naproxen Other (See Comments)    unknown    Family History  Problem Relation Age of Onset  . Cancer    . Diabetes    . Arthritis    . Asthma     Family history: Both her parents died of heart attacks.  Prior to Admission  medications   Medication Sig Start Date End Date Taking? Authorizing Provider  albuterol (PROVENTIL HFA;VENTOLIN HFA) 108 (90 BASE) MCG/ACT inhaler Inhale 2 puffs into the lungs every 6 (six) hours as needed. Shortness of breath   Yes Historical Provider, MD  ALPRAZolam (XANAX) 0.5 MG tablet Take 0.5 mg by mouth 4 (four) times daily. nerves   Yes Historical Provider, MD  aspirin 325 MG tablet Take 325 mg by mouth daily.     Yes Historical Provider, MD  diphenhydrAMINE (BENADRYL) 25 MG tablet Take 25 mg by mouth at bedtime.   Yes Historical  Provider, MD  exemestane (AROMASIN) 25 MG tablet Take 1 tablet (25 mg total) by mouth daily after breakfast. 11/10/12  Yes Randall An, MD  levothyroxine (SYNTHROID, LEVOTHROID) 88 MCG tablet Take 88 mcg by mouth daily.   Yes Historical Provider, MD  losartan-hydrochlorothiazide (HYZAAR) 50-12.5 MG per tablet Take 1 tablet by mouth daily.     Yes Historical Provider, MD  magnesium sulfate (EPSOM SALTS) crystals Take by mouth as needed. If very constipated   Yes Historical Provider, MD  potassium chloride SA (K-DUR,KLOR-CON) 20 MEQ tablet Take 20 mEq by mouth daily.   Yes Historical Provider, MD  predniSONE (DELTASONE) 10 MG tablet Take 5 mg by mouth daily.   Yes Historical Provider, MD  traMADol (ULTRAM) 50 MG tablet Take 100 mg by mouth every 6 (six) hours as needed. Maximum dose= 8 tablets per day pain   Yes Historical Provider, MD  triamcinolone cream (KENALOG) 0.1 % Apply 1 application topically 2 (two) times daily as needed. Apply to affected areas   Yes Historical Provider, MD   Physical Exam: Filed Vitals:   06/11/13 0745 06/11/13 0908 06/11/13 1012 06/11/13 1123  BP: 111/50 104/64 117/72 132/87  Pulse: 89 88 88 87  Temp: 98.4 F (36.9 C)     TempSrc: Oral     Resp:  22 22 20   Height: 5\' 4"  (1.626 m)     Weight: 102.059 kg (225 lb)     SpO2: 92% 95% 96% 96%     General:  Pleasant alert obese 72 year old Caucasian woman laying in bed, in no acute distress.  Eyes: Pupils equal, round, reactive to light. Extraocular movements are intact. Conjunctivae are clear, sclerae are white.  ENT: Oropharynx reveals no teeth. Mucous membranes are mildly dry. No posterior exudates or edema. Nasal mucosa is dry.  Neck: Supple, no adenopathy, no thyromegaly, no JVD.  Chest wall: No masses palpated over both breasts. Mild tenderness over the left pectoralis muscles. No rash or warmth.  Cardiovascular: S1, S2, with a soft systolic murmur.  Respiratory: Occasional faint expiratory  wheezes.  Abdomen: Positive bowel sounds, morbidly obese, nontender, nondistended.  Skin: Skin turgor fair. No rashes.  Musculoskeletal: Dactaly deformities from cerebral palsy. Pedal pulses palpable. 1-2+ left lower extremity edema. Trace of right lower extremity edema.  Psychiatric: Pleasant. Alert and oriented x3.  Neurologic: Sequelae of cerebral palsy with facial tics. Cranial nerves II through XII are intact with exception of sequelae from cerebral palsy including mild dysarthria and facial abnormalities. Hand grip approximately 4+ to 5 minus over 5. She is able to raise each leg against gravity 30-40.  Labs on Admission:  Basic Metabolic Panel:  Recent Labs Lab 06/11/13 0813  NA 139  K 3.7  CL 96  CO2 38*  GLUCOSE 118*  BUN 11  CREATININE 0.49*  CALCIUM 8.8   Liver Function Tests: No results found for this basename: AST, ALT,  ALKPHOS, BILITOT, PROT, ALBUMIN,  in the last 168 hours No results found for this basename: LIPASE, AMYLASE,  in the last 168 hours No results found for this basename: AMMONIA,  in the last 168 hours CBC:  Recent Labs Lab 06/11/13 0813  WBC 6.1  NEUTROABS 5.0  HGB 13.0  HCT 43.0  MCV 91.1  PLT 144*   Cardiac Enzymes:  Recent Labs Lab 06/11/13 0813  TROPONINI <0.30    BNP (last 3 results) No results found for this basename: PROBNP,  in the last 8760 hours CBG: No results found for this basename: GLUCAP,  in the last 168 hours  Radiological Exams on Admission: Dg Chest Port 1 View  06/11/2013   *RADIOLOGY REPORT*  Clinical Data: Chest pain  PORTABLE CHEST - 1 VIEW  Comparison: 05/28/2011  Findings: Low lung volumes are present, causing crowding of the pulmonary vasculature.  Band-like opacity noted above the right hemidiaphragm.  Indistinct retrocardiac airspace opacity observed.  Cardiomegaly observed with mild prominence of upper zone pulmonary vasculature.  IMPRESSION:  1.  Low lung volumes, with mild atelectasis along the right  hemidiaphragm and nonspecific retrocardiac airspace opacity on the left, potentially from atelectasis or pneumonia. 2.  Cardiomegaly with cephalization of blood flow suggesting pulmonary venous hypertension.   Original Report Authenticated By: Gaylyn Rong, M.D.    EKG: Reviewed. As above in the history of present illness.  Assessment/Plan Principal Problem:   Chest pain Active Problems:   Wheezes   Asthma   DEGENERATIVE JOINT DISEASE, RIGHT KNEE   Morbid obesity   Cerebral palsy   Thrombocytopenia   Hypertension   Chronic steroid use   Leg edema, left   Chronic respiratory failure with hypoxia   Hypothyroidism   1. This is a pleasant elderly obese 72 year old woman who presents with chest pain. She has risk factors including age, family history, and hypertension. Her EKG appears nonacute. Her initial troponin I. is negative. Her d-dimer is borderline normal. These are reassuring findings. However, given her risks factors, I believe an admission is warranted for further evaluation and management. Her blood pressure is low-normal, and therefore, we'll hold antihypertensive medication until tomorrow. She has some wheezing on exam, but she has chronic asthma. She has chronic respiratory failure with hypoxia secondary to asthma and probably obesity hypoventilation syndrome. She uses oxygen only at night. Her thrombocytopenia is likely the consequence of chemotherapy for which she is undergoing for right-sided breast cancer. She has left lower extremity edema which apparently is chronic, but would favor ordering a lower extremity venous ultrasound to rule out DVT even in the setting of borderline normal d-dimer.    Plan: 1. We'll admit the patient for 24-48 hour observation. 2. [facility] consult cardiology for further recommendations and diagnostic evaluation beyond conservative workup. 3. Bronchodilator therapy for mild wheezes. She will be continued on chronic steroids for treatment of  asthma. Continue oxygen at 2 L per minute. Will add sublingual nitroglycerin when necessary. Prophylactic Lovenox, but monitor her platelet count. We'll add PPI. 4. Will gently hydrate. Will hold losartan-HCTZ until the morning. 5. For further evaluation, we'll order a 2-D echocardiogram, cardiac enzymes, TSH, and vitamin B12 level.  Code Status: Full code. Family Communication: Discussed with her sister Mrs. Rice Disposition Plan: Likely discharge to home in the next 24-48 hours.  Time spent: One hour  [name] Pager [phone]  If 7PM-7AM, please contact night-coverage [email or web address] Password [facility] 06/11/2013, 12:19 PM

## 2013-06-11 NOTE — Consult Note (Signed)
CARDIOLOGY CONSULT NOTE  Patient ID: [name] MRN: [id number] DOB/AGE: [birth year] 72 y.o.  Admit date: 06/11/2013 Referring Physician: PTHSherrie Mustache MD Primary Herb Grays, MD Primary Cardiologist: Mission Bing, (Not seen for many years) Reason for Consultation: Chest Pain  HPI: [name] is a 72 y/o patient with cerebral palsy (functional with ADLs), COPD, prior CVA, hypertension, anxiety and depression, right breast cancer s/p lumpectomy. Previous cardiology follow noted in 2006 where she was followed by Dr. Dorethea Clan for chest pain. Had stress echo demonstrating no evidence of ischemia in 2006. She has also reported having cardiac cath by Dr. :Corinda Gubler "many years ago" that was negative.  She describes having recent worsening of tightening chest discomfort into her back, with associated shortness of breath and diaphoresis. She states she usually has these symptoms when she is upset or anxious. She states she has been having this discomfort in her chest on and off for the last week with and without exertion, not always with anxiety. She states she awoke in the middle of the night crying out and very upset, and noticed that she was having some chest pain with some low-grade nausea. She states that she sleeps in recliner and usually sitting up and standing relieves the discomfort in her chest. This time it did not. The patient had some mild radiation to her left arm and left jaw on occasion but not consistently. Pain it lasted for several minutes and usually goes away on its own when she calms down from an emotional upset.   The recent chest pain was the worse as she has ever felt and therefore  she presented to the emergency room. Blood pressure is 111/50 with a heart rate of 89 O2 sat of 92%. Troponin was found to be negative. A mildly elevated d-dimer at 0.48. She was not found to be anemic. Potassium was 3.7, Creatinine 0.49. CXR negative for CHF. LE doppler negative for DVT.  EKG demonstrated NSR with PAC's with HR of 93 bpm. No evidence of ACS. She was given ASA in ER and has had no further chest pain.    Review of systems complete and found to be negative unless listed above   Past Medical History  Diagnosis Date  . Cerebral palsy   . Asthma   . Seasonal allergies   . Thyroid disease   . HTN (hypertension)   . Anxiety   . Arthritis   . Breast cancer     Right breast, infiltrating ductal.  . Anginal pain   . Osteoporosis 05/08/2012  . Osteoporosis 05/08/2012  . Stroke   . Angina at rest   . Chronic steroid use     Family History  Problem Relation Age of Onset  . Cancer    . Diabetes    . Arthritis    . Asthma      History   Social History  . Marital Status: Single    Spouse Name: N/A    Number of Children: N/A  . Years of Education: college   Occupational History  . disabled    Social History Main Topics  . Smoking status: Never Smoker   . Smokeless tobacco: Never Used  . Alcohol Use: No  . Drug Use: No  . Sexually Active: No   Other Topics Concern  . Not on file   Social History Narrative  . No narrative on file    Past Surgical History  Procedure Laterality Date  . Breast lumpectomy    .  Radical abdominal hysterectomy    . Dental extraction    . Right hand surgery       Facility-administered medications prior to admission  Medication Dose Route Frequency Provider Last Rate Last Dose  . methylPREDNISolone acetate (DEPO-MEDROL) injection 40 mg  40 mg Intra-articular Once Vickki Hearing, MD       Prescriptions prior to admission  Medication Sig Dispense Refill  . albuterol (PROVENTIL HFA;VENTOLIN HFA) 108 (90 BASE) MCG/ACT inhaler Inhale 2 puffs into the lungs every 6 (six) hours as needed. Shortness of breath      . ALPRAZolam (XANAX) 0.5 MG tablet Take 0.5 mg by mouth 4 (four) times daily. nerves      . aspirin 325 MG tablet Take 325 mg by mouth daily.        . diphenhydrAMINE (BENADRYL) 25 MG tablet Take 25 mg by  mouth at bedtime.      Marland Kitchen exemestane (AROMASIN) 25 MG tablet Take 1 tablet (25 mg total) by mouth daily after breakfast.  30 tablet  6  . levothyroxine (SYNTHROID, LEVOTHROID) 88 MCG tablet Take 88 mcg by mouth daily.      Marland Kitchen losartan-hydrochlorothiazide (HYZAAR) 50-12.5 MG per tablet Take 1 tablet by mouth daily.        . magnesium sulfate (EPSOM SALTS) crystals Take by mouth as needed. If very constipated      . potassium chloride SA (K-DUR,KLOR-CON) 20 MEQ tablet Take 20 mEq by mouth daily.      . predniSONE (DELTASONE) 10 MG tablet Take 5 mg by mouth daily.      . traMADol (ULTRAM) 50 MG tablet Take 100 mg by mouth every 6 (six) hours as needed. Maximum dose= 8 tablets per day pain      . triamcinolone cream (KENALOG) 0.1 % Apply 1 application topically 2 (two) times daily as needed. Apply to affected areas        Physical Exam: Blood pressure 115/45, pulse 94, temperature 98.4 F (36.9 C), temperature source Oral, resp. rate 20, height 5\' 4"  (1.626 m), weight 236 lb 12.4 oz (107.4 kg), SpO2 92.00%.  General: Obese in no acute distress Head: Eyes PERRLA, No xanthomas.   Normal cephalic and atraumatic, edentulous.  Lungs: Clear bilaterally poor inspiratory effort, no wheezes or coughing. Heart: HRRR S1 S2, slightly tachycardic without MRG.  Pulses are 2+ & equal.            No carotid bruit. No JVD. Abdomen: Bowel sounds are positive, abdomen soft and non-tender. Msk:  Back normal,  Normal strength and tone for age. Extremities: No clubbing, cyanosis or edema.  DP +1 Neuro: Alert and oriented X 3. Palsy incoordination with facial tics, mild dysarthria and facial abnormalities. Good historian. Marland Kitchen Psych:  Good affect, responds appropriately  Labs:   Lab Results  Component Value Date   WBC 6.1 06/11/2013   HGB 13.0 06/11/2013   HCT 43.0 06/11/2013   MCV 91.1 06/11/2013   PLT 144* 06/11/2013    Recent Labs Lab 06/11/13 0813  NA 139  K 3.7  CL 96  CO2 38*  BUN 11  CREATININE 0.49*   CALCIUM 8.8  GLUCOSE 118*   Lab Results  Component Value Date             TROPONINI <0.30 06/11/2013    Echocardiogram: 06/2012 Procedure narrative: Transthoracic echocardiography. Image quality was suboptimal and the study did not have complete images. - Left ventricle: The cavity size was normal. There was mild concentric  hypertrophy. Systolic function was normal. The estimated ejection fraction was in the range of 60% to 65%. Wall motion was normal; there were no regional wall motion abnormalities. The study is not technically sufficient to allow evaluation of LV diastolic function. - Left atrium: The atrium was at the upper limits of normal in size.  BNP (last 3 results)  Recent Labs  06/11/13 0813  PROBNP 67.7     Radiology: US Venous Img Lower Unilateral Left  06/11/2013   *RADIOLOGY REPORT*  Clinical Data: Left lower extremity pain and edema  LEFT LOWER EXTREMITY VENOUS DUPLEX ULTRASOUND  Technique:  Gray-scale sonography with graded compression, as well as color Doppler and duplex ultrasound were performed to evaluate the deep venous system of the lower extremity from the level of the common femoral vein through the popliteal and proximal calf veins. Spectral Doppler was utilized to evaluate flow at rest and with distal augmentation maneuvers.  Comparison:  None.  Findings:  Normal compressibility of the common femoral, superficial femoral, and popliteal veins is demonstrated, as well as the visualized proximal calf veins.  No filling defects to suggest DVT on grayscale or color Doppler imaging.  Doppler waveforms show normal direction of venous flow, normal respiratory phasicity and response to augmentation.  Peripheral subcutaneous calf edema noted.  IMPRESSION: No evidence of lower extremity deep vein thrombosis.   Original Report Authenticated By: Judie Petit. Miles Costain, M.D.   Dg Chest Port 1 View  06/11/2013   *RADIOLOGY REPORT*  Clinical Data: Chest pain  PORTABLE CHEST - 1 VIEW   Comparison: 05/28/2011  Findings: Low lung volumes are present, causing crowding of the pulmonary vasculature.  Band-like opacity noted above the right hemidiaphragm.  Indistinct retrocardiac airspace opacity observed.  Cardiomegaly observed with mild prominence of upper zone pulmonary vasculature.  IMPRESSION:  1.  Low lung volumes, with mild atelectasis along the right hemidiaphragm and nonspecific retrocardiac airspace opacity on the left, potentially from atelectasis or pneumonia. 2.  Cardiomegaly with cephalization of blood flow suggesting pulmonary venous hypertension.   Original Report Authenticated By: Gaylyn Rong, M.D.   EKG: NSR with PAC's.   ASSESSMENT AND PLAN:   1. Chest Pain: Typical and atypical features usually occurring with emotional upset, but not always. However, she was awakened in the early morning hours, stating that she was calling out and crying, with associated chest pressure and pain radiating to her back. She states she has been having this pain on and off for about a week with or without exertion.  Review of past records does not find cardiac catheterization, but a normal dobutamine stress echo in 2006 associated with a normal echocardiogram per Dr. Dorethea Clan.. Cardiac enzymes are negative, she does have some nonspecific tenderness to palpation of the left chest, not the same pain that brought her in. EKG is negative for ACS. We will plan a Lexiscan Myoview in a.m.Marland Kitchen Continue aspirin. History of COPD would not add beta blocker. Continue to cycle cardiac enzymes.  2. Hypertension: Initially blood pressure was low normal, and continues on losartan 50 mg/12.5 mg daily which are being held today but we will restart in a.m. Echocardiogram will be ordered.  3. COPD with asthma: Continue steroids and inhalers. She may have evidence of obesity hyperventilation, or OSA. She uses oxygen at night, and would recommend continuing this.  4. Anxiety and Depression: Can certainly be  contributing to chest discomfort. She admits that when she is upset chest discomfort is prominent. She is currently on anti-anxiety medications.  Signed:  Bettey Mare. Lyman Bishop NP [facility] 06/11/2013, 3:48 PM Co-Sign MD   Attending note:  Patient seen and examined. Modified above noted by Ms. Lawrence NP. [name] presents with recent worsening chest tightness with radiation to the neck and back, seems to be mainly associated with anxiety, however some episodes have occurred otherwise without precipitant. Had a particularly intense episode that woke her from sleep. Prior cardiac workup has been reassuring. She reports a "normal" cardiac catheterization many years ago with Dr. Corinda Gubler, however could not locate report. She did have a negative dobutamine echocardiogram in 2006. Cardiac markers argue against ACS at this time an ECG shows no dynamic ST changes, low voltage. She is comfortable now on examination. Plan to followup on full set of cardiac markers, review echocardiogram. Will tentatively schedule a Lexiscan Myoview for tomorrow to more objectively exclude interval development of ischemic heart disease.  Jonelle Sidle, M.D., F.A.C.C.

## 2013-06-11 NOTE — ED Notes (Signed)
Pt reports woke up with chest pain around 5am.  Says pain is in left side of chest radiating to left arm, describes as sharp.  EMS administered 324mg  aspirin.  EMS reports pain is reproducible with palpation.  Pt reports has had anxiety.  Pt took xanax and tramadol this am.

## 2013-06-12 ENCOUNTER — Observation Stay (HOSPITAL_COMMUNITY): Payer: Medicare Other

## 2013-06-12 DIAGNOSIS — R079 Chest pain, unspecified: Secondary | ICD-10-CM

## 2013-06-12 LAB — TSH: TSH: 1.339 u[IU]/mL (ref 0.350–4.500)

## 2013-06-12 LAB — CBC
Hemoglobin: 12.5 g/dL (ref 12.0–15.0)
MCH: 27.1 pg (ref 26.0–34.0)
MCHC: 29.3 g/dL — ABNORMAL LOW (ref 30.0–36.0)
RDW: 16.2 % — ABNORMAL HIGH (ref 11.5–15.5)

## 2013-06-12 LAB — BASIC METABOLIC PANEL
Calcium: 8.2 mg/dL — ABNORMAL LOW (ref 8.4–10.5)
GFR calc Af Amer: >90 mL/min (ref >90)
GFR calc non Af Amer: >90 mL/min (ref >90)
Glucose, Bld: 96 mg/dL (ref 70–99)
Potassium: 3.8 mEq/L (ref 3.5–5.1)
Sodium: 141 mEq/L (ref 135–145)

## 2013-06-12 LAB — VITAMIN B12: Vitamin B-12: 209 pg/mL — ABNORMAL LOW (ref 211–911)

## 2013-06-12 MED ORDER — TECHNETIUM TC 99M SESTAMIBI GENERIC - CARDIOLITE
11.0000 | Freq: Once | INTRAVENOUS | Status: AC | PRN
  Administered 2013-06-12: 11 via INTRAVENOUS

## 2013-06-12 MED ORDER — TECHNETIUM TC 99M SESTAMIBI - CARDIOLITE
30.0000 | Freq: Once | INTRAVENOUS | Status: AC | PRN
  Administered 2013-06-12: 13:00:00 30 via INTRAVENOUS

## 2013-06-12 MED ORDER — SODIUM CHLORIDE 0.9 % IJ SOLN
INTRAMUSCULAR | Status: AC
  Administered 2013-06-12: 10 mL via INTRAVENOUS
  Filled 2013-06-12: qty 10

## 2013-06-12 MED ORDER — REGADENOSON 0.4 MG/5ML IV SOLN
INTRAVENOUS | Status: AC
  Administered 2013-06-12: 0.4 mg via INTRAVENOUS
  Filled 2013-06-12: qty 5

## 2013-06-12 MED ORDER — ALPRAZOLAM 0.5 MG PO TABS
0.5000 mg | ORAL_TABLET | Freq: Once | ORAL | Status: AC
  Administered 2013-06-12: 0.5 mg via ORAL
  Filled 2013-06-12: qty 1

## 2013-06-12 MED ORDER — ALBUTEROL SULFATE (5 MG/ML) 0.5% IN NEBU
2.5000 mg | INHALATION_SOLUTION | Freq: Three times a day (TID) | RESPIRATORY_TRACT | Status: DC
  Administered 2013-06-12 – 2013-06-13 (×2): 2.5 mg via RESPIRATORY_TRACT
  Filled 2013-06-12 (×3): qty 0.5

## 2013-06-12 NOTE — Progress Notes (Signed)
Stress Lab Nurses Notes - [name]  [name] 06/12/2013 Reason for doing test: Chest Pain Type of test: [facility]cher / Inpatient Room 339 Nurse performing test: Parke Poisson, RN Nuclear Medicine Tech: Dillard Essex Echo Tech: Not Applicable MD performing test: R. Rothbart & Joni Reining NP Family MD: Juanetta Gosling Test explained and consent signed: yes IV started: IV in progress from floor and No redness or edema Symptoms: "Felt heart beating faster" mild SOB  O2 in progress 2L Via n/c Treatment/Intervention: None Reason test stopped: protocol completed After recovery IV was: Left intact (rate of 50) Patient to return to Nuc. Med at : 12:45 Patient discharged: Home Patient's Condition upon discharge was: stable Comments: During test BP 100/52 & HR 108.  Recovery BP 98/52 & HR 96.  Symptoms resolved in recovery.  Erskine Speed T

## 2013-06-12 NOTE — Progress Notes (Signed)
Subjective: She says she feels okay. She has no new complaints. She has not had anymore chest pain. She is not as anxious.  Objective: Vital signs in last 24 hours: Temp:  [97.9 F (36.6 C)-98.4 F (36.9 C)] 97.9 F (36.6 C) (06/24 0551) Pulse Rate:  [84-98] 84 (06/24 0551) Resp:  [19-22] 20 (06/24 0551) BP: (104-146)/(45-108) 146/86 mmHg (06/24 0551) SpO2:  [91 %-96 %] 91 % (06/24 0732) Weight:  [107.4 kg (236 lb 12.4 oz)-108.41 kg (239 lb)] 108.41 kg (239 lb) (06/24 0236) Weight change:  Last BM Date: 06/12/13  Intake/Output from previous day: 06/23 0701 - 06/24 0700 In: 950.8 [P.O.:240; I.V.:710.8] Out: -   PHYSICAL EXAM General appearance: alert, cooperative and mild distress Resp: clear to auscultation bilaterally Cardio: regular rate and rhythm, S1, S2 normal, no murmur, click, rub or gallop GI: soft, non-tender; bowel sounds normal; no masses,  no organomegaly Extremities: extremities normal, atraumatic, no cyanosis or edema  Lab Results:    Basic Metabolic Panel:  Recent Labs  16/10/96 0813 06/12/13 0549  NA 139 141  K 3.7 3.8  CL 96 99  CO2 38* 37*  GLUCOSE 118* 96  BUN 11 8  CREATININE 0.49* 0.47*  CALCIUM 8.8 8.2*   Liver Function Tests: No results found for this basename: AST, ALT, ALKPHOS, BILITOT, PROT, ALBUMIN,  in the last 72 hours No results found for this basename: LIPASE, AMYLASE,  in the last 72 hours No results found for this basename: AMMONIA,  in the last 72 hours CBC:  Recent Labs  06/11/13 0813 06/12/13 0549  WBC 6.1 6.3  NEUTROABS 5.0  --   HGB 13.0 12.5  HCT 43.0 42.6  MCV 91.1 92.2  PLT 144* 142*   Cardiac Enzymes:  Recent Labs  06/11/13 0813 06/11/13 1359 06/11/13 2004  TROPONINI <0.30 <0.30 <0.30   BNP:  Recent Labs  06/11/13 0813  PROBNP 67.7   D-Dimer:  Recent Labs  06/11/13 0817  DDIMER 0.48   CBG: No results found for this basename: GLUCAP,  in the last 72 hours Hemoglobin A1C: No results  found for this basename: HGBA1C,  in the last 72 hours Fasting Lipid Panel: No results found for this basename: CHOL, HDL, LDLCALC, TRIG, CHOLHDL, LDLDIRECT,  in the last 72 hours Thyroid Function Tests:  Recent Labs  06/11/13 1359  TSH 1.339   Anemia Panel:  Recent Labs  06/11/13 1359  VITAMINB12 209*   Coagulation: No results found for this basename: LABPROT, INR,  in the last 72 hours Urine Drug Screen: Drugs of Abuse  No results found for this basename: labopia, cocainscrnur, labbenz, amphetmu, thcu, labbarb    Alcohol Level: No results found for this basename: ETH,  in the last 72 hours Urinalysis: No results found for this basename: COLORURINE, APPERANCEUR, LABSPEC, PHURINE, GLUCOSEU, HGBUR, BILIRUBINUR, KETONESUR, PROTEINUR, UROBILINOGEN, NITRITE, LEUKOCYTESUR,  in the last 72 hours Misc. Labs:  ABGS No results found for this basename: PHART, PCO2, PO2ART, TCO2, HCO3,  in the last 72 hours CULTURES Recent Results (from the past 240 hour(s))  MRSA PCR SCREENING     Status: None   Collection Time    06/11/13  7:10 PM      Result Value Range Status   MRSA by PCR NEGATIVE  NEGATIVE Final   Comment:            The GeneXpert MRSA Assay (FDA     approved for NASAL specimens     only), is one component  of a     comprehensive MRSA colonization     surveillance program. It is not     intended to diagnose MRSA     infection nor to guide or     monitor treatment for     MRSA infections.   Studies/Results: US Venous Img Lower Unilateral Left  06/11/2013   *RADIOLOGY REPORT*  Clinical Data: Left lower extremity pain and edema  LEFT LOWER EXTREMITY VENOUS DUPLEX ULTRASOUND  Technique:  Gray-scale sonography with graded compression, as well as color Doppler and duplex ultrasound were performed to evaluate the deep venous system of the lower extremity from the level of the common femoral vein through the popliteal and proximal calf veins. Spectral Doppler was utilized to  evaluate flow at rest and with distal augmentation maneuvers.  Comparison:  None.  Findings:  Normal compressibility of the common femoral, superficial femoral, and popliteal veins is demonstrated, as well as the visualized proximal calf veins.  No filling defects to suggest DVT on grayscale or color Doppler imaging.  Doppler waveforms show normal direction of venous flow, normal respiratory phasicity and response to augmentation.  Peripheral subcutaneous calf edema noted.  IMPRESSION: No evidence of lower extremity deep vein thrombosis.   Original Report Authenticated By: Judie Petit. Miles Costain, M.D.   Dg Chest Port 1 View  06/11/2013   *RADIOLOGY REPORT*  Clinical Data: Chest pain  PORTABLE CHEST - 1 VIEW  Comparison: 05/28/2011  Findings: Low lung volumes are present, causing crowding of the pulmonary vasculature.  Band-like opacity noted above the right hemidiaphragm.  Indistinct retrocardiac airspace opacity observed.  Cardiomegaly observed with mild prominence of upper zone pulmonary vasculature.  IMPRESSION:  1.  Low lung volumes, with mild atelectasis along the right hemidiaphragm and nonspecific retrocardiac airspace opacity on the left, potentially from atelectasis or pneumonia. 2.  Cardiomegaly with cephalization of blood flow suggesting pulmonary venous hypertension.   Original Report Authenticated By: Gaylyn Rong, M.D.    Medications:  Prior to Admission:  Facility-administered medications prior to admission  Medication Dose Route Frequency Provider Last Rate Last Dose  . methylPREDNISolone acetate (DEPO-MEDROL) injection 40 mg  40 mg Intra-articular Once Vickki Hearing, MD       Prescriptions prior to admission  Medication Sig Dispense Refill  . albuterol (PROVENTIL HFA;VENTOLIN HFA) 108 (90 BASE) MCG/ACT inhaler Inhale 2 puffs into the lungs every 6 (six) hours as needed. Shortness of breath      . ALPRAZolam (XANAX) 0.5 MG tablet Take 0.5 mg by mouth 4 (four) times daily. nerves      .  aspirin 325 MG tablet Take 325 mg by mouth daily.        . diphenhydrAMINE (BENADRYL) 25 MG tablet Take 25 mg by mouth at bedtime.      Marland Kitchen exemestane (AROMASIN) 25 MG tablet Take 1 tablet (25 mg total) by mouth daily after breakfast.  30 tablet  6  . levothyroxine (SYNTHROID, LEVOTHROID) 88 MCG tablet Take 88 mcg by mouth daily.      Marland Kitchen losartan-hydrochlorothiazide (HYZAAR) 50-12.5 MG per tablet Take 1 tablet by mouth daily.        . magnesium sulfate (EPSOM SALTS) crystals Take by mouth as needed. If very constipated      . potassium chloride SA (K-DUR,KLOR-CON) 20 MEQ tablet Take 20 mEq by mouth daily.      . predniSONE (DELTASONE) 10 MG tablet Take 5 mg by mouth daily.      . traMADol (ULTRAM) 50 MG tablet  Take 100 mg by mouth every 6 (six) hours as needed. Maximum dose= 8 tablets per day pain      . triamcinolone cream (KENALOG) 0.1 % Apply 1 application topically 2 (two) times daily as needed. Apply to affected areas       Scheduled: . albuterol  2.5 mg Nebulization Q6H  . ALPRAZolam  0.5 mg Oral QID  . antiseptic oral rinse  15 mL Mouth Rinse BID  . aspirin  325 mg Oral Daily  . diphenhydrAMINE  25 mg Oral QHS  . docusate sodium  100 mg Oral BID  . enoxaparin (LOVENOX) injection  40 mg Subcutaneous Q24H  . exemestane  25 mg Oral QPC breakfast  . losartan  50 mg Oral Daily   And  . hydrochlorothiazide  12.5 mg Oral Daily  . levothyroxine  88 mcg Oral QAC breakfast  . pantoprazole  40 mg Oral Daily  . potassium chloride SA  20 mEq Oral Daily  . predniSONE  5 mg Oral Daily  . regadenoson  0.4 mg Intravenous Once   Continuous: . 0.9 % NaCl with KCl 20 mEq / [name] 50 mL/hr at 06/11/13 1547   ZOX:[id number], acetaminophen, alum & mag hydroxide-simeth, guaiFENesin-dextromethorphan, morphine injection, nitroGLYCERIN, ondansetron (ZOFRAN) IV, ondansetron, traMADol  Assesment: She was admitted with chest pain which is somewhat atypical. She has been evaluated in the past but her last  evaluation with stress echocardiogram was in 2006. She does have multiple risk factors. She is set for stress test later today. Principal Problem:   Chest pain Active Problems:   DEGENERATIVE JOINT DISEASE, RIGHT KNEE   Wheezes   Morbid obesity   Cerebral palsy   Thrombocytopenia   Asthma   Hypertension   Chronic steroid use   Leg edema, left   Chronic respiratory failure with hypoxia   Hypothyroidism    Plan: Stress test later today. Depending on the results of that she may be able to be discharged    LOS: 1 day   [name] 06/12/2013, 8:26 AM

## 2013-06-12 NOTE — Progress Notes (Signed)
UR Chart Review Completed  

## 2013-06-12 NOTE — Evaluation (Signed)
Physical Therapy Evaluation Patient Details Name: [name] MRN: [id number] DOB: 04-07-1941 Today's Date: 06/12/2013 Time: 2952-8413 PT Time Calculation (min): 47 min  PT Assessment / Plan / Recommendation Clinical Impression  Pt was seen for evaluation.  She is a very pleasant woman, known to me from previous admissions.  She has cerebral palsy and has actually been able to manage at home with assist of sister and CAP aide.  She had increased respiratory difficulty with me today...normally she uses supplemental O2 just for sleeping but has it on 4 L/min today.  Her O2 sat was 92% at rest but decreased to 86% after ambulating 40'.  She was very dyspneic.  She is needing more and more assistance with ADLs at home and I have recommended ACLF for her...she knows this is her reality, but I don't think she is quite ready for this yet.  She c/o gerneralized weakness, so I would offer HHPT at d/c.    PT Assessment  All further PT needs can be met in the next venue of care    Follow Up Recommendations  Home health PT    Does the patient have the potential to tolerate intense rehabilitation      Barriers to Discharge        Equipment Recommendations  None recommended by PT    Recommendations for Other Services     Frequency      Precautions / Restrictions Precautions Precautions: Fall Restrictions Weight Bearing Restrictions: No   Pertinent Vitals/Pain       Mobility  Bed Mobility Bed Mobility: Not assessed Transfers Transfers: Sit to Stand;Stand to Sit Sit to Stand: 5: Supervision;From chair/3-in-1 Stand to Sit: 5: Supervision;With upper extremity assist Ambulation/Gait Ambulation/Gait Assistance: 5: Supervision Ambulation Distance (Feet): 40 Feet Assistive device: 4-wheeled walker Gait Pattern: Trunk flexed Gait velocity: WNL General Gait Details: 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 and would be best if she had supervision at all times with gait...she  has 2 wheelchairs (manual and powered) but in house uses her walker Stairs: No Wheelchair Mobility Wheelchair Mobility: No    Exercises     PT Diagnosis: Generalized weakness;Difficulty walking  PT Problem List: Decreased activity tolerance;Decreased mobility;Cardiopulmonary status limiting activity;Obesity PT Treatment Interventions:     PT Goals    Visit Information  Last PT Received On: 06/12/13    Subjective Data  Subjective: I've gotten weak Patient Stated Goal: wants to get stronger...realizes that she won't be able to stay alone for much longer and will need to consider ACLF and would like information on that   Prior Functioning  Home Living Lives With: Alone Available Help at Discharge: Personal care attendant;Family;Available PRN/intermittently Type of Home: Apartment Home Access: Level entry Home Layout: One level Bathroom Shower/Tub:  (takes a sponge bath) Bathroom Toilet: Standard Home Adaptive Equipment: Walker - four wheeled;Wheelchair - manual;Wheelchair - powered;Bedside commode/3-in-1;Straight cane;Hospital bed Additional Comments: pt sleps in a lift chair Prior Function Level of Independence: Needs assistance Needs Assistance: Bathing;Dressing;Grooming;Toileting;Meal Prep;Light Housekeeping Bath: Moderate Dressing: Moderate Grooming: Moderate Toileting: Moderate Meal Prep: Maximal Light Housekeeping: Maximal Able to Take Stairs?: No Driving: No Vocation: Retired Musician: Expressive difficulties (due to cerebral palsy)    Cognition  Cognition Arousal/Alertness: Awake/alert Behavior During Therapy: WFL for tasks assessed/performed Overall Cognitive Status: Within Functional Limits for tasks assessed    Extremity/Trunk Assessment Right Lower Extremity Assessment RLE ROM/Strength/Tone: WFL for tasks assessed RLE Sensation: WFL - Light Touch RLE Coordination: WFL - gross motor  Left Lower Extremity Assessment LLE  ROM/Strength/Tone: WFL for tasks assessed LLE Sensation: WFL - Light Touch LLE Coordination: WFL - gross motor Trunk Assessment Trunk Assessment: Kyphotic (scoliosis)   Balance Balance Balance Assessed: Yes Static Standing Balance Static Standing - Balance Support: Left upper extremity supported Static Standing - Level of Assistance: 5: Stand by assistance  End of Session PT - End of Session Equipment Utilized During Treatment: Gait belt;Oxygen Activity Tolerance: Patient tolerated treatment well Patient left: in chair;with call bell/phone within reach;with chair alarm set Nurse Communication: Mobility status  GP Functional Assessment Tool Used: clinical judgement Functional Limitation: Mobility: Walking and moving around Mobility: Walking and Moving Around Current Status ([id number]): At least 1 percent but less than 20 percent impaired, limited or restricted Mobility: Walking and Moving Around Goal Status [phone]): At least 1 percent but less than 20 percent impaired, limited or restricted Mobility: Walking and Moving Around Discharge Status [phone]): At least 1 percent but less than 20 percent impaired, limited or restricted   [name] 06/12/2013, 10:05 AM

## 2013-06-12 NOTE — Care Management Note (Unsigned)
    Page 1 of 1   06/12/2013     2:49:02 PM   CARE MANAGEMENT NOTE 06/12/2013  Patient:  LAYTOYA, ION   Account Number:  [id number]  Date Initiated:  06/12/2013  Documentation initiated by:  Rosemary Holms  Subjective/Objective Assessment:   Pt admitted from home. Pt has a CNA every day and either her son or sister stays with her at night. Concerned that sister will need knee surgery and pt will need to go to a ALF. CSW assisting pt, giving her a listing of facilities.     Action/Plan:   Spoke to son Renae Fickle. [facility] selected for PT and SW. Explained to [place] that SW will assist them when pt is ready for ALF.   Anticipated DC Date:  06/13/2013   Anticipated DC Plan:  HOME W HOME HEALTH SERVICES  In-house referral  Clinical Social Worker      DC Planning Services  CM consult      Choice offered to / List presented to:          [facility] arranged  HH-2 PT  HH-6 SOCIAL WORKER      HH agency  [facility]   Status of service:  In process, will continue to follow Medicare Important Message given?   (If response is "NO", the following Medicare IM given date fields will be blank) Date Medicare IM given:   Date Additional Medicare IM given:    Discharge Disposition:    Per UR Regulation:    If discussed at Long Length of Stay Meetings, dates discussed:    Comments:  06/12/13 Rosemary Holms RN BSN CM

## 2013-06-12 NOTE — Progress Notes (Signed)
Pt has had a lot of urinary frequency and urgency today. Bladder scanned pt to see if she is retaining any urine. Bladder scan only showed 39mL. Will continue to monitor.

## 2013-06-12 NOTE — Progress Notes (Signed)
SUBJECTIVE: One episode of chest pain last evening while she became upset.   LABS: Basic Metabolic Panel:  Recent Labs  16/10/96 0813 06/12/13 0549  NA 139 141  K 3.7 3.8  CL 96 99  CO2 38* 37*  GLUCOSE 118* 96  BUN 11 8  CREATININE 0.49* 0.47*  CALCIUM 8.8 8.2*   CBC:  Recent Labs  06/11/13 0813 06/12/13 0549  WBC 6.1 6.3  NEUTROABS 5.0  --   HGB 13.0 12.5  HCT 43.0 42.6  MCV 91.1 92.2  PLT 144* 142*   Cardiac Enzymes:  Recent Labs  06/11/13 0813 06/11/13 1359 06/11/13 2004  TROPONINI <0.30 <0.30 <0.30   D-Dimer:  Recent Labs  06/11/13 0817  DDIMER 0.48   Thyroid Function Tests:  Recent Labs  06/11/13 1359  TSH 1.339   Echocardiogram 06/11/2013 Mild LVH; nl EF; no significant valvular abnormalities.No change from previous limited study July 2013.  RADIOLOGY:  06/11/2013  LEFT LOWER EXTREMITY VENOUS DUPLEX ULTRASOUND    No evidence of lower extremity deep vein thrombosis.     Dg Chest Port 1 View  06/11/2013   1.  Low lung volumes, with mild atelectasis along the right hemidiaphragm and nonspecific retrocardiac airspace opacity on the left, potentially from atelectasis or pneumonia. 2.  Cardiomegaly with cephalization of blood flow suggesting pulmonary venous hypertension.     PHYSICAL EXAM BP 146/86  Pulse 88  Temp(Src) 97.9 F (36.6 C) (Oral)  Resp 18  Ht 5\' 4"  (1.626 m)  Wt 239 lb (108.41 kg)  BMI 41 kg/m2  SpO2 92% General: Well developed, well nourished, in no acute distress Head: Eyes PERRLA, No xanthomas.   Normal cephalic and atramatic  Lungs: Clear bilaterally to auscultation and percussion. Heart: HRRR S1 S2, No MRG .  Pulses are 2+ & equal.            No carotid bruit. No JVD.  No abdominal bruits. No femoral bruits. Abdomen: Bowel sounds are positive, abdomen soft and non-tender without masses or                  Hernia's noted. Msk:  Back normal, normal gait. Normal strength and tone for age. Extremities: No clubbing,  cyanosis or edema.  DP +1 Neuro: Alert and oriented X 3. Psych:  Good affect, responds appropriately  TELEMETRY: Reviewed telemetry pt in: NSR rate of 88-90.  ASSESSMENT AND PLAN:  1.Atypical Chest Pain: He is usually associated with emotional upset, however she did awaken with chest discomfort early morning hours on day of admission. EKG and troponin negative for acute coronary syndrome. The patient didn't have any stress test in 2006 which was negative. Plan Lexiscan Myoview today.. If negative may return home. Echo reassuring.   2. Hypertension: ARB has been held due to hypotension. Restarted this am.   3. COPD: Continues treatment with steroids and inhalers. On O2 at HS.   4. Anxiety and Depression: May be contributing to chest pain symptoms. Usually has this pain with emotional upset.   Bettey Mare. Lyman Bishop NP [facility] 06/12/2013, 10:41 AM  Cardiology Attending Patient interviewed and examined. Discussed with Joni Reining, NP.  Above note annotated and modified based upon my findings.  Stress nuclear study negative for ischemia or infarction. Doubt cardiac origin for patient's chest discomfort. Can be discharged from cardiology standpoint.  [place], MD 06/12/2013, 6:01 PM

## 2013-06-12 NOTE — Clinical Social Work Psychosocial (Signed)
Clinical Social Work Department BRIEF PSYCHOSOCIAL ASSESSMENT 06/12/2013  Patient:  [name], [name]     Account Number:  [id number]     Admit date:  06/11/2013  Clinical Social Worker:  Nancie Neas  Date/Time:  06/12/2013 10:35 AM  Referred by:  CSW  Date Referred:  06/12/2013 Referred for  ALF Placement   Other Referral:   Interview type:  Patient Other interview type:    PSYCHOSOCIAL DATA Living Status:  ALONE Admitted from facility:   Level of care:   Primary support name:  [place] Primary support relationship to patient:  SIBLING Degree of support available:   supportive per pt    CURRENT CONCERNS Current Concerns  Post-Acute Placement   Other Concerns:    SOCIAL WORK ASSESSMENT / PLAN CSW met with pt at bedside. Pt alert and oriented and reports she lives alone. She has a CAP aid Monday through Friday for 7 hours a day and on Saturday for 3 hours. Pt's sister, Sherle Poe and pt's son Renae Fickle alternate staying with her at night. Pt indicates they are very involved and supportive. Pt uses a walker around her apartment and states she gets around fine. She also has a Passenger transport manager. Pt reports she has realized that she needs to be considering ALF soon as she does not want to be too much for her family. She mainly requires assistance with dressing, bathing, and toileting. Pt became emotional as she explained that they had been so good to her and helped her out, but her sister is having surgery at some point and her son has a family to care for as well. CSW provided support to pt. CSW explained ALF process for when she is ready and gave her an ALF list for [facility]. Pt plans to call several facilities and visit before making a decision on where she wants to go. She would like to take her lift chair with her and is aware that it would depend on the faicility if this is possible. Pt notified of payment for ALF and that she would have to give up most of her check.    Assessment/plan status:  Referral to [facility] Other assessment/ plan:   Information/referral to community resources:   ALF list    PATIENT'S/FAMILY'S RESPONSE TO PLAN OF CARE: Pt is having a very difficult time thinking about leaving her home to go to ALF. However, she states she knows it is coming and wants to be ready. Appreciative of CSW visit and pt is aware to contact CSW with further questions. At this point, pt plans to return home from hospital. CM present at end of assessment. CSW signing off but can be reconsulted if needed.       Derenda Fennel, [place] [phone]

## 2013-06-13 MED ORDER — DIPHENHYDRAMINE HCL 25 MG PO CAPS: 25.0000 mg | ORAL_CAPSULE | Freq: Every day | ORAL | Status: DC

## 2013-06-13 MED ORDER — TRAMADOL HCL 50 MG PO TABS: 50.0000 mg | ORAL_TABLET | Freq: Four times a day (QID) | ORAL | Status: DC | PRN

## 2013-06-13 MED ORDER — ALPRAZOLAM 0.5 MG PO TABS
0.5000 mg | ORAL_TABLET | Freq: Four times a day (QID) | ORAL | Status: DC
  Administered 2013-06-13 (×2): 0.5 mg via ORAL
  Filled 2013-06-13 (×2): qty 1

## 2013-06-13 MED ORDER — ALBUTEROL SULFATE HFA 108 (90 BASE) MCG/ACT IN AERS: 2.0000 | INHALATION_SPRAY | RESPIRATORY_TRACT | Status: AC | PRN

## 2013-06-13 MED ORDER — ASPIRIN 325 MG PO TABS: 325.0000 mg | ORAL_TABLET | Freq: Every day | ORAL | Status: DC

## 2013-06-13 MED ORDER — PREDNISONE 5 MG PO TABS: 5.0000 mg | ORAL_TABLET | Freq: Every day | ORAL | Status: DC

## 2013-06-13 MED ORDER — ALPRAZOLAM 0.5 MG PO TABS: 0.5000 mg | ORAL_TABLET | Freq: Four times a day (QID) | ORAL | Status: DC

## 2013-06-13 MED ORDER — POTASSIUM CHLORIDE CRYS ER 20 MEQ PO TBCR: 20.0000 meq | EXTENDED_RELEASE_TABLET | Freq: Every day | ORAL | Status: DC

## 2013-06-13 MED ORDER — NITROGLYCERIN 0.4 MG SL SUBL: 0.4000 mg | SUBLINGUAL_TABLET | SUBLINGUAL | Status: AC | PRN

## 2013-06-13 MED ORDER — PANTOPRAZOLE SODIUM 40 MG PO TBEC: 40.0000 mg | DELAYED_RELEASE_TABLET | Freq: Every day | ORAL | Status: DC

## 2013-06-13 MED ORDER — EXEMESTANE 25 MG PO TABS: 25.0000 mg | ORAL_TABLET | Freq: Every day | ORAL | Status: DC

## 2013-06-13 MED ORDER — LEVOTHYROXINE SODIUM 88 MCG PO TABS: 88.0000 ug | ORAL_TABLET | Freq: Every day | ORAL | Status: DC

## 2013-06-13 MED ORDER — TUBERCULIN PPD 5 UNIT/0.1ML ID SOLN
5.0000 [IU] | Freq: Once | INTRADERMAL | Status: DC
  Administered 2013-06-13: 5 [IU] via INTRADERMAL
  Filled 2013-06-13: qty 0.1

## 2013-06-13 NOTE — Progress Notes (Signed)
Subjective: She says she feels better. She had her Cardiolite stress test but we don't have the report yet. She has interest in seeing about going to an assisted living facility and may want to do that when she leaves the hospital. If her stress test is okay she can go home later today  Objective: Vital signs in last 24 hours: Temp:  [97.4 F (36.3 C)-97.9 F (36.6 C)] 97.4 F (36.3 C) (06/25 0500) Pulse Rate:  [88-90] 90 (06/25 0500) Resp:  [17-20] 17 (06/25 0500) BP: (92-118)/(53-76) 92/53 mmHg (06/25 0500) SpO2:  [91 %-95 %] 93 % (06/25 0732) Weight:  [106.459 kg (234 lb 11.2 oz)] 106.459 kg (234 lb 11.2 oz) (06/25 0500) Weight change: 4.4 kg (9 lb 11.2 oz) Last BM Date: 06/12/13  Intake/Output from previous day: 06/24 0701 - 06/25 0700 In: 1200 [I.V.:1200] Out: 375 [Urine:375]  PHYSICAL EXAM General appearance: alert, cooperative, mild distress and morbidly obese Resp: clear to auscultation bilaterally Cardio: regular rate and rhythm, S1, S2 normal, no murmur, click, rub or gallop GI: soft, non-tender; bowel sounds normal; no masses,  no organomegaly Extremities: extremities normal, atraumatic, no cyanosis or edema  Lab Results:    Basic Metabolic Panel:  Recent Labs  74/25/95 0813 06/12/13 0549  NA 139 141  K 3.7 3.8  CL 96 99  CO2 38* 37*  GLUCOSE 118* 96  BUN 11 8  CREATININE 0.49* 0.47*  CALCIUM 8.8 8.2*   Liver Function Tests: No results found for this basename: AST, ALT, ALKPHOS, BILITOT, PROT, ALBUMIN,  in the last 72 hours No results found for this basename: LIPASE, AMYLASE,  in the last 72 hours No results found for this basename: AMMONIA,  in the last 72 hours CBC:  Recent Labs  06/11/13 0813 06/12/13 0549  WBC 6.1 6.3  NEUTROABS 5.0  --   HGB 13.0 12.5  HCT 43.0 42.6  MCV 91.1 92.2  PLT 144* 142*   Cardiac Enzymes:  Recent Labs  06/11/13 0813 06/11/13 1359 06/11/13 2004  TROPONINI <0.30 <0.30 <0.30   BNP:  Recent Labs  06/11/13 0813  PROBNP 67.7   D-Dimer:  Recent Labs  06/11/13 0817  DDIMER 0.48   CBG: No results found for this basename: GLUCAP,  in the last 72 hours Hemoglobin A1C: No results found for this basename: HGBA1C,  in the last 72 hours Fasting Lipid Panel: No results found for this basename: CHOL, HDL, LDLCALC, TRIG, CHOLHDL, LDLDIRECT,  in the last 72 hours Thyroid Function Tests:  Recent Labs  06/11/13 1359  TSH 1.339   Anemia Panel:  Recent Labs  06/11/13 1359  VITAMINB12 209*   Coagulation: No results found for this basename: LABPROT, INR,  in the last 72 hours Urine Drug Screen: Drugs of Abuse  No results found for this basename: labopia, cocainscrnur, labbenz, amphetmu, thcu, labbarb    Alcohol Level: No results found for this basename: ETH,  in the last 72 hours Urinalysis: No results found for this basename: COLORURINE, APPERANCEUR, LABSPEC, PHURINE, GLUCOSEU, HGBUR, BILIRUBINUR, KETONESUR, PROTEINUR, UROBILINOGEN, NITRITE, LEUKOCYTESUR,  in the last 72 hours Misc. Labs:  ABGS No results found for this basename: PHART, PCO2, PO2ART, TCO2, HCO3,  in the last 72 hours CULTURES Recent Results (from the past 240 hour(s))  MRSA PCR SCREENING     Status: None   Collection Time    06/11/13  7:10 PM      Result Value Range Status   MRSA by PCR NEGATIVE  NEGATIVE Final  Comment:            The GeneXpert MRSA Assay (FDA     approved for NASAL specimens     only), is one component of a     comprehensive MRSA colonization     surveillance program. It is not     intended to diagnose MRSA     infection nor to guide or     monitor treatment for     MRSA infections.   Studies/Results: US Venous Img Lower Unilateral Left  06/11/2013   *RADIOLOGY REPORT*  Clinical Data: Left lower extremity pain and edema  LEFT LOWER EXTREMITY VENOUS DUPLEX ULTRASOUND  Technique:  Gray-scale sonography with graded compression, as well as color Doppler and duplex ultrasound were  performed to evaluate the deep venous system of the lower extremity from the level of the common femoral vein through the popliteal and proximal calf veins. Spectral Doppler was utilized to evaluate flow at rest and with distal augmentation maneuvers.  Comparison:  None.  Findings:  Normal compressibility of the common femoral, superficial femoral, and popliteal veins is demonstrated, as well as the visualized proximal calf veins.  No filling defects to suggest DVT on grayscale or color Doppler imaging.  Doppler waveforms show normal direction of venous flow, normal respiratory phasicity and response to augmentation.  Peripheral subcutaneous calf edema noted.  IMPRESSION: No evidence of lower extremity deep vein thrombosis.   Original Report Authenticated By: Judie Petit. Miles Costain, M.D.    Medications:  Prior to Admission:  Facility-administered medications prior to admission  Medication Dose Route Frequency Provider Last Rate Last Dose  . methylPREDNISolone acetate (DEPO-MEDROL) injection 40 mg  40 mg Intra-articular Once Vickki Hearing, MD       Prescriptions prior to admission  Medication Sig Dispense Refill  . albuterol (PROVENTIL HFA;VENTOLIN HFA) 108 (90 BASE) MCG/ACT inhaler Inhale 2 puffs into the lungs every 6 (six) hours as needed. Shortness of breath      . ALPRAZolam (XANAX) 0.5 MG tablet Take 0.5 mg by mouth 4 (four) times daily. nerves      . aspirin 325 MG tablet Take 325 mg by mouth daily.        . diphenhydrAMINE (BENADRYL) 25 MG tablet Take 25 mg by mouth at bedtime.      Marland Kitchen exemestane (AROMASIN) 25 MG tablet Take 1 tablet (25 mg total) by mouth daily after breakfast.  30 tablet  6  . levothyroxine (SYNTHROID, LEVOTHROID) 88 MCG tablet Take 88 mcg by mouth daily.      Marland Kitchen losartan-hydrochlorothiazide (HYZAAR) 50-12.5 MG per tablet Take 1 tablet by mouth daily.        . magnesium sulfate (EPSOM SALTS) crystals Take by mouth as needed. If very constipated      . potassium chloride SA  (K-DUR,KLOR-CON) 20 MEQ tablet Take 20 mEq by mouth daily.      . predniSONE (DELTASONE) 10 MG tablet Take 5 mg by mouth daily.      . traMADol (ULTRAM) 50 MG tablet Take 100 mg by mouth every 6 (six) hours as needed. Maximum dose= 8 tablets per day pain      . triamcinolone cream (KENALOG) 0.1 % Apply 1 application topically 2 (two) times daily as needed. Apply to affected areas       Scheduled: . albuterol  2.5 mg Nebulization TID  . ALPRAZolam  0.5 mg Oral Q6H  . antiseptic oral rinse  15 mL Mouth Rinse BID  . aspirin  325  mg Oral Daily  . diphenhydrAMINE  25 mg Oral QHS  . docusate sodium  100 mg Oral BID  . enoxaparin (LOVENOX) injection  40 mg Subcutaneous Q24H  . exemestane  25 mg Oral QPC breakfast  . losartan  50 mg Oral Daily   And  . hydrochlorothiazide  12.5 mg Oral Daily  . levothyroxine  88 mcg Oral QAC breakfast  . pantoprazole  40 mg Oral Daily  . potassium chloride SA  20 mEq Oral Daily  . predniSONE  5 mg Oral Daily  . regadenoson  0.4 mg Intravenous Once   Continuous: . 0.9 % NaCl with KCl 20 mEq / L 50 mL/hr at 06/13/13 0848   HQI:ONGEXBMWUXLKG, acetaminophen, alum & mag hydroxide-simeth, guaiFENesin-dextromethorphan, morphine injection, nitroGLYCERIN, ondansetron (ZOFRAN) IV, ondansetron, traMADol  Assesment: She was admitted with chest pain that may be noncardiac but stress test is pending. She has multiple other medical problems including asthma/COPD with chronic respiratory failure, cerebral palsy hypertension morbid obesity. She is very anxious. Principal Problem:   Chest pain Active Problems:   DEGENERATIVE JOINT DISEASE, RIGHT KNEE   Wheezes   Morbid obesity   Cerebral palsy   Thrombocytopenia   Asthma   Hypertension   Chronic steroid use   Leg edema, left   Chronic respiratory failure with hypoxia   Hypothyroidism    Plan: I think she's better and may be able to be discharged. She probably does need some form of assisted living.    LOS: 2  days   Tyshon Fanning L 06/13/2013, 8:52 AM

## 2013-06-13 NOTE — Progress Notes (Signed)
PAtient with orders to be discharge to [facility]. Discharge instructions sent with staff from [facility], who were to evaluate patient. Patient in stable condition upon discharge. Patient left with sister in private vehicle.

## 2013-06-13 NOTE — Clinical Social Work Note (Signed)
MD notified CSW that pt had changed her mind and wanted to go to ALF from hospital. CSW faxed pt out to requested facilities and bed offer made by [facility] which pt accepts. RN to give TB skin test and circle on arm for facility to read. Pt's sister present in room and will transport pt. Facility took packet with prescriptions and FL2. Pt's sister will provide portable oxygen tank for this afternoon and pt's son plans to bring concentrator to [facility] later today. CM notified. Oxygen through [facility].   [name], [place] [phone]

## 2013-06-13 NOTE — Progress Notes (Signed)
Patient TB skin test completed in left forearm.

## 2013-06-13 NOTE — Clinical Social Work Placement (Signed)
Clinical Social Work Department CLINICAL SOCIAL WORK PLACEMENT NOTE 06/13/2013  Patient:  [name], [name]  Account Number:  [id number] Admit date:  06/11/2013  Clinical Social Worker:  Derenda Fennel, LCSW  Date/time:  06/13/2013 12:10 PM  Clinical Social Work is seeking post-discharge placement for this patient at the following level of care:   ASSISTED LIVING/REST HOME   (*CSW will update this form in Epic as items are completed)   06/13/2013  Patient/family provided with [facility] Department of Clinical Social Work's list of facilities offering this level of care within the geographic area requested by the patient (or if unable, by the patient's family).  06/13/2013  Patient/family informed of their freedom to choose among providers that offer the needed level of care, that participate in Medicare, Medicaid or managed care program needed by the patient, have an available bed and are willing to accept the patient.  06/13/2013  Patient/family informed of [facility]' ownership interest in [facility], as well as of the fact that they are under no obligation to receive care at this facility.  PASARR submitted to EDS on 06/13/2013 PASARR number received from [employer] on 06/13/2013  FL2 transmitted to all facilities in geographic area requested by pt/family on  06/13/2013 FL2 transmitted to all facilities within larger geographic area on   Patient informed that his/her managed care company has contracts with or will negotiate with  certain facilities, including the following:     Patient/family informed of bed offers received:  06/13/2013 Patient chooses bed at [facility] Physician recommends and patient chooses bed at  [facility]  Patient to be transferred to [facility] on  06/13/2013 Patient to be transferred to facility by sister  The following physician request were entered in Epic:   Additional  Comments:  Derenda Fennel, LCSW [phone]

## 2013-06-13 NOTE — Discharge Summary (Signed)
Physician Discharge Summary  Patient ID: [name] MRN: [id number] DOB/AGE: Sep 11, 1941 72 y.o. Primary Care Physician:Laila Myhre L, MD Admit date: 06/11/2013 Discharge date: 06/13/2013    Discharge Diagnoses:   Principal Problem:   Chest pain Active Problems:   DEGENERATIVE JOINT DISEASE, RIGHT KNEE   Wheezes   Morbid obesity   Cerebral palsy   Thrombocytopenia   Asthma   Hypertension   Chronic steroid use   Leg edema, left   Chronic respiratory failure with hypoxia   Hypothyroidism     Medication List    STOP taking these medications       diphenhydrAMINE 25 MG tablet  Commonly known as:  BENADRYL  Replaced by:  diphenhydrAMINE 25 mg capsule     triamcinolone cream 0.1 %  Commonly known as:  KENALOG      TAKE these medications       albuterol 108 (90 BASE) MCG/ACT inhaler  Commonly known as:  PROVENTIL HFA;VENTOLIN HFA  Inhale 2 puffs into the lungs every 4 (four) hours as needed. Shortness of breath     ALPRAZolam 0.5 MG tablet  Commonly known as:  XANAX  Take 1 tablet (0.5 mg total) by mouth 4 (four) times daily. nerves     aspirin 325 MG tablet  Take 1 tablet (325 mg total) by mouth daily.     diphenhydrAMINE 25 mg capsule  Commonly known as:  BENADRYL  Take 1 capsule (25 mg total) by mouth at bedtime.     exemestane 25 MG tablet  Commonly known as:  AROMASIN  Take 1 tablet (25 mg total) by mouth daily after breakfast.     levothyroxine 88 MCG tablet  Commonly known as:  SYNTHROID, LEVOTHROID  Take 1 tablet (88 mcg total) by mouth daily before breakfast.     losartan-hydrochlorothiazide 50-12.5 MG per tablet  Commonly known as:  HYZAAR  Take 1 tablet by mouth daily.     magnesium sulfate crystals  Commonly known as:  EPSOM SALTS  Take by mouth as needed. If very constipated     nitroGLYCERIN 0.4 MG SL tablet  Commonly known as:  NITROSTAT  Place 1 tablet (0.4 mg total) under the tongue every 5 (five) minutes as needed for chest  pain.     pantoprazole 40 MG tablet  Commonly known as:  PROTONIX  Take 1 tablet (40 mg total) by mouth daily.     potassium chloride SA 20 MEQ tablet  Commonly known as:  K-DUR,KLOR-CON  Take 1 tablet (20 mEq total) by mouth daily.     predniSONE 5 MG tablet  Commonly known as:  DELTASONE  Take 1 tablet (5 mg total) by mouth daily.     traMADol 50 MG tablet  Commonly known as:  ULTRAM  Take 1 tablet (50 mg total) by mouth every 6 (six) hours as needed.        Discharged Condition: Improved    Consults: Cardiology  Significant Diagnostic Studies: US Venous Img Lower Unilateral Left  06/11/2013   *RADIOLOGY REPORT*  Clinical Data: Left lower extremity pain and edema  LEFT LOWER EXTREMITY VENOUS DUPLEX ULTRASOUND  Technique:  Gray-scale sonography with graded compression, as well as color Doppler and duplex ultrasound were performed to evaluate the deep venous system of the lower extremity from the level of the common femoral vein through the popliteal and proximal calf veins. Spectral Doppler was utilized to evaluate flow at rest and with distal augmentation maneuvers.  Comparison:  None.  Findings:  Normal compressibility of the common femoral, superficial femoral, and popliteal veins is demonstrated, as well as the visualized proximal calf veins.  No filling defects to suggest DVT on grayscale or color Doppler imaging.  Doppler waveforms show normal direction of venous flow, normal respiratory phasicity and response to augmentation.  Peripheral subcutaneous calf edema noted.  IMPRESSION: No evidence of lower extremity deep vein thrombosis.   Original Report Authenticated By: Judie Petit. Miles Costain, M.D.   Dg Chest Port 1 View  06/11/2013   *RADIOLOGY REPORT*  Clinical Data: Chest pain  PORTABLE CHEST - 1 VIEW  Comparison: 05/28/2011  Findings: Low lung volumes are present, causing crowding of the pulmonary vasculature.  Band-like opacity noted above the right hemidiaphragm.  Indistinct  retrocardiac airspace opacity observed.  Cardiomegaly observed with mild prominence of upper zone pulmonary vasculature.  IMPRESSION:  1.  Low lung volumes, with mild atelectasis along the right hemidiaphragm and nonspecific retrocardiac airspace opacity on the left, potentially from atelectasis or pneumonia. 2.  Cardiomegaly with cephalization of blood flow suggesting pulmonary venous hypertension.   Original Report Authenticated By: Gaylyn Rong, M.D.    Lab Results: Basic Metabolic Panel:  Recent Labs  95/62/13 0813 06/12/13 0549  NA 139 141  K 3.7 3.8  CL 96 99  CO2 38* 37*  GLUCOSE 118* 96  BUN 11 8  CREATININE 0.49* 0.47*  CALCIUM 8.8 8.2*   Liver Function Tests: No results found for this basename: AST, ALT, ALKPHOS, BILITOT, PROT, ALBUMIN,  in the last 72 hours   CBC:  Recent Labs  06/11/13 0813 06/12/13 0549  WBC 6.1 6.3  NEUTROABS 5.0  --   HGB 13.0 12.5  HCT 43.0 42.6  MCV 91.1 92.2  PLT 144* 142*    Recent Results (from the past 240 hour(s))  MRSA PCR SCREENING     Status: None   Collection Time    06/11/13  7:10 PM      Result Value Range Status   MRSA by PCR NEGATIVE  NEGATIVE Final   Comment:            The GeneXpert MRSA Assay (FDA     approved for NASAL specimens     only), is one component of a     comprehensive MRSA colonization     surveillance program. It is not     intended to diagnose MRSA     infection nor to guide or     monitor treatment for     MRSA infections.     Hospital Course: She was admitted with chest pain. This was somewhat atypical. She had been in emotionally upset and feels that that may have caused the problem. She came to the emergency department and initial troponin level was normal. She had serial troponin levels and was found to be negative for acute MI. She underwent Cardiolite stress testing which is pending at this time. She decided that she would like to go to an assisted living facility because she feels like  she's requiring more care than her family can manage now  Discharge Exam: Blood pressure 92/53, pulse 90, temperature 97.4 F (36.3 C), temperature source Oral, resp. rate 17, height 5\' 4"  (1.626 m), weight 106.459 kg (234 lb 11.2 oz), SpO2 93.00%. She is awake and alert. Her speech is somewhat slurred as always. Her chest is clear. Her heart is regular. She is morbidly obese.  Disposition: To assisted living facility if her stress test report is okay  Discharge Orders   Future Appointments Provider Department Dept Phone   11/23/2013 11:30 AM [facility] [phone]   11/23/2013 12:00 PM [facility] [phone]   11/23/2013 12:30 PM Randall An, MD [facility] [phone]   Future Orders Complete By Expires     Discharge patient  As directed          Signed: Yuleni Burich L Pager [phone]  06/13/2013, 9:14 AM

## 2013-06-14 ENCOUNTER — Other Ambulatory Visit (HOSPITAL_COMMUNITY): Payer: Self-pay | Admitting: *Deleted

## 2013-08-06 ENCOUNTER — Encounter (HOSPITAL_COMMUNITY): Payer: Self-pay | Admitting: Emergency Medicine

## 2013-08-06 ENCOUNTER — Emergency Department (HOSPITAL_COMMUNITY): Payer: Medicare Other

## 2013-08-06 ENCOUNTER — Emergency Department (HOSPITAL_COMMUNITY)
Admission: EM | Admit: 2013-08-06 | Discharge: 2013-08-06 | Disposition: A | Discharge status: Home / Self Care | Payer: Medicare Other | Source: Home / Self Care | Attending: Emergency Medicine | Admitting: Emergency Medicine

## 2013-08-06 DIAGNOSIS — R0602 Shortness of breath: Secondary | ICD-10-CM | POA: Insufficient documentation

## 2013-08-06 DIAGNOSIS — Z853 Personal history of malignant neoplasm of breast: Secondary | ICD-10-CM | POA: Insufficient documentation

## 2013-08-06 DIAGNOSIS — Z8679 Personal history of other diseases of the circulatory system: Secondary | ICD-10-CM | POA: Insufficient documentation

## 2013-08-06 DIAGNOSIS — R05 Cough: Secondary | ICD-10-CM | POA: Insufficient documentation

## 2013-08-06 DIAGNOSIS — J189 Pneumonia, unspecified organism: Secondary | ICD-10-CM

## 2013-08-06 DIAGNOSIS — J45901 Unspecified asthma with (acute) exacerbation: Secondary | ICD-10-CM | POA: Insufficient documentation

## 2013-08-06 DIAGNOSIS — Z79899 Other long term (current) drug therapy: Secondary | ICD-10-CM | POA: Insufficient documentation

## 2013-08-06 DIAGNOSIS — Z8673 Personal history of transient ischemic attack (TIA), and cerebral infarction without residual deficits: Secondary | ICD-10-CM | POA: Insufficient documentation

## 2013-08-06 DIAGNOSIS — Z8739 Personal history of other diseases of the musculoskeletal system and connective tissue: Secondary | ICD-10-CM | POA: Insufficient documentation

## 2013-08-06 DIAGNOSIS — I10 Essential (primary) hypertension: Secondary | ICD-10-CM | POA: Insufficient documentation

## 2013-08-06 DIAGNOSIS — IMO0002 Reserved for concepts with insufficient information to code with codable children: Secondary | ICD-10-CM | POA: Insufficient documentation

## 2013-08-06 DIAGNOSIS — R059 Cough, unspecified: Secondary | ICD-10-CM | POA: Insufficient documentation

## 2013-08-06 DIAGNOSIS — Z7982 Long term (current) use of aspirin: Secondary | ICD-10-CM | POA: Insufficient documentation

## 2013-08-06 DIAGNOSIS — Z8669 Personal history of other diseases of the nervous system and sense organs: Secondary | ICD-10-CM | POA: Insufficient documentation

## 2013-08-06 DIAGNOSIS — F411 Generalized anxiety disorder: Secondary | ICD-10-CM | POA: Insufficient documentation

## 2013-08-06 DIAGNOSIS — M129 Arthropathy, unspecified: Secondary | ICD-10-CM | POA: Insufficient documentation

## 2013-08-06 MED ORDER — LEVOFLOXACIN 750 MG PO TABS: 750.0000 mg | ORAL_TABLET | Freq: Every day | ORAL | Status: DC

## 2013-08-06 MED ORDER — LEVOFLOXACIN 750 MG PO TABS
750.0000 mg | ORAL_TABLET | Freq: Once | ORAL | Status: AC
  Administered 2013-08-06: 750 mg via ORAL
  Filled 2013-08-06: qty 1

## 2013-08-06 MED ORDER — PREDNISONE 20 MG PO TABS: 40.0000 mg | ORAL_TABLET | Freq: Every day | ORAL | Status: DC

## 2013-08-06 MED ORDER — IPRATROPIUM BROMIDE 0.02 % IN SOLN
0.5000 mg | RESPIRATORY_TRACT | Status: AC
  Administered 2013-08-06: 0.5 mg via RESPIRATORY_TRACT
  Filled 2013-08-06: qty 2.5

## 2013-08-06 MED ORDER — ALBUTEROL SULFATE (5 MG/ML) 0.5% IN NEBU
5.0000 mg | INHALATION_SOLUTION | RESPIRATORY_TRACT | Status: AC
  Administered 2013-08-06: 5 mg via RESPIRATORY_TRACT
  Filled 2013-08-06: qty 1

## 2013-08-06 MED ORDER — PREDNISONE 20 MG PO TABS
40.0000 mg | ORAL_TABLET | Freq: Once | ORAL | Status: AC
  Administered 2013-08-06: 40 mg via ORAL
  Filled 2013-08-06: qty 2

## 2013-08-06 NOTE — ED Notes (Signed)
Placed patient on 2L O2 via nasal canula. O2 saturation increased to 93%.

## 2013-08-06 NOTE — ED Notes (Signed)
Patient from [facility] with complaint of shortness of breath and cough x 3 days. States was given antibiotic by PCP but is worse.

## 2013-08-06 NOTE — ED Provider Notes (Signed)
CSN: [id number]     Arrival date & time 08/06/13  0457 History     First MD Initiated Contact with Patient 08/06/13 0459     Chief Complaint  Patient presents with  . Shortness of Breath  . Cough   (Consider location/radiation/quality/duration/timing/severity/associated sxs/prior Treatment) HPI Comments: 72 year old female with a history of cerebral palsy and a history of stroke who presents with a complaint of coughing, shortness of breath. This started approximately 4 days ago, she was placed on Zithromax by her primary caregiver on August 15. She has been using this medication but has not been improving in fact she states that the cough is getting slightly worse. She denies nausea or vomiting but does endorse fevers. The symptoms are persistent, nothing seems to make it better or worse. She does not have a history of reactive airway disease. Paramedics transported the patient to the hospital, no medications were given prior to arrival. Of note the patient does require 2 L of oxygen at baseline  Patient is a 72 y.o. female presenting with shortness of breath and cough. The history is provided by the patient and the EMS personnel.  Shortness of Breath Associated symptoms: cough   Cough Associated symptoms: shortness of breath     Past Medical History  Diagnosis Date  . Cerebral palsy   . Asthma   . Seasonal allergies   . Thyroid disease   . HTN (hypertension)   . Anxiety   . Arthritis   . Breast cancer     Right breast, infiltrating ductal.  . Anginal pain   . Osteoporosis 05/08/2012  . Osteoporosis 05/08/2012  . Stroke   . Angina at rest   . Chronic steroid use    Past Surgical History  Procedure Laterality Date  . Breast lumpectomy    . Radical abdominal hysterectomy    . Dental extraction    . Right hand surgery     Family History  Problem Relation Age of Onset  . Cancer    . Diabetes    . Arthritis    . Asthma     History  Substance Use Topics  . Smoking  status: Never Smoker   . Smokeless tobacco: Never Used  . Alcohol Use: No   OB History   Grav Para Term Preterm Abortions TAB SAB Ect Mult Living                 Review of Systems  Respiratory: Positive for cough and shortness of breath.   All other systems reviewed and are negative.    Allergies  Naproxen  Home Medications   Current Outpatient Rx  Name  Route  Sig  Dispense  Refill  . albuterol (PROVENTIL HFA;VENTOLIN HFA) 108 (90 BASE) MCG/ACT inhaler   Inhalation   Inhale 2 puffs into the lungs every 4 (four) hours as needed. Shortness of breath   1 Inhaler   12   . ALPRAZolam (XANAX) 0.5 MG tablet   Oral   Take 1 tablet (0.5 mg total) by mouth 4 (four) times daily. nerves   120 tablet   5   . aspirin 325 MG tablet   Oral   Take 1 tablet (325 mg total) by mouth daily.   30 tablet   12   . diphenhydrAMINE (BENADRYL) 25 mg capsule   Oral   Take 1 capsule (25 mg total) by mouth at bedtime.   30 capsule   0   . levofloxacin (LEVAQUIN) 750 MG tablet  Oral   Take 1 tablet (750 mg total) by mouth daily.   7 tablet   0   . levothyroxine (SYNTHROID, LEVOTHROID) 88 MCG tablet   Oral   Take 1 tablet (88 mcg total) by mouth daily before breakfast.   30 tablet   12   . losartan-hydrochlorothiazide (HYZAAR) 50-12.5 MG per tablet   Oral   Take 1 tablet by mouth daily.           . magnesium sulfate (EPSOM SALTS) crystals   Oral   Take by mouth as needed. If very constipated         . nitroGLYCERIN (NITROSTAT) 0.4 MG SL tablet   Sublingual   Place 1 tablet (0.4 mg total) under the tongue every 5 (five) minutes as needed for chest pain.   25 tablet   12   . pantoprazole (PROTONIX) 40 MG tablet   Oral   Take 1 tablet (40 mg total) by mouth daily.   30 tablet   12   . potassium chloride SA (K-DUR,KLOR-CON) 20 MEQ tablet   Oral   Take 1 tablet (20 mEq total) by mouth daily.   30 tablet   12   . predniSONE (DELTASONE) 20 MG tablet   Oral   Take  2 tablets (40 mg total) by mouth daily.   10 tablet   0   . predniSONE (DELTASONE) 5 MG tablet   Oral   Take 1 tablet (5 mg total) by mouth daily.   30 tablet   12   . traMADol (ULTRAM) 50 MG tablet   Oral   Take 1 tablet (50 mg total) by mouth every 6 (six) hours as needed.   60 tablet   5    BP 108/74  Pulse 81  Temp(Src) 98.5 F (36.9 C) (Oral)  Resp 24  Ht 5\' 3"  (1.6 m)  Wt 235 lb (106.595 kg)  BMI 41.64 kg/m2  SpO2 93% Physical Exam  Nursing note and vitals reviewed. Constitutional: She appears well-developed and well-nourished. No distress.  HENT:  Head: Normocephalic and atraumatic.  Mouth/Throat: Oropharynx is clear and moist. No oropharyngeal exudate.  Eyes: Conjunctivae and EOM are normal. Pupils are equal, round, and reactive to light. Right eye exhibits no discharge. Left eye exhibits no discharge. No scleral icterus.  Neck: Normal range of motion. Neck supple. No JVD present. No thyromegaly present.  Cardiovascular: Normal rate, regular rhythm, normal heart sounds and intact distal pulses.  Exam reveals no gallop and no friction rub.   No murmur heard. Pulmonary/Chest: Effort normal. No respiratory distress. She has wheezes. She has no rales.  Abdominal: Soft. Bowel sounds are normal. She exhibits no distension and no mass. There is no tenderness.  Musculoskeletal: Normal range of motion. She exhibits no edema and no tenderness.  Lymphadenopathy:    She has no cervical adenopathy.  Neurological: She is alert. Coordination normal.  Skin: Skin is warm and dry. No rash noted. No erythema.  Psychiatric: She has a normal mood and affect. Her behavior is normal.    ED Course   Procedures (including critical care time)  Labs Reviewed - No data to display Dg Chest [facility] 1 View  08/06/2013   *RADIOLOGY REPORT*  Clinical Data: Cough and shortness of breath for 3 days.  PORTABLE CHEST - 1 VIEW  Comparison: 06/11/2013  Findings: Shallow inspiration. Mild cardiac  enlargement and pulmonary vascular congestion.  No edema.  Infiltration or atelectasis in the right lung base.  Improving  infiltration or atelectasis in the left lung base.  No pneumothorax.  No blunting of costophrenic angles.  IMPRESSION: Cardiac enlargement with mild pulmonary vascular congestion and infiltration or atelectasis in the right lung base are stable. Improving infiltrate/atelectasis in the left lung base.   Original Report Authenticated By: Burman Nieves, M.D.   1. Asthma attack   2. HCAP (healthcare-associated pneumonia)     MDM  The patient has diffuse expiratory wheezing, mild tachypnea but speaks in full sentences. She likely has a respiratory infection, question is whether this is viral or bacterial. X-rays pending, albuterol nebulizer, Atrovent nebulizer, prednisone by mouth, recheck.  After nebs, pt has improved air movement, sat's are > 95% and she is speaking at baseline.  I have personally seen and interpretted the xray of the chest - portable AP view and find there to be a possible infiltrate / atelectasis at the R base that could be consistent with pna.  She has been given albuterol, atrovent and prednisone and Levaquin and appears stable for d/c.  Meds given in ED:  Medications  levofloxacin (LEVAQUIN) tablet 750 mg (not administered)  albuterol (PROVENTIL) (5 MG/ML) 0.5% nebulizer solution 5 mg (5 mg Nebulization Given 08/06/13 0525)  ipratropium (ATROVENT) nebulizer solution 0.5 mg (0.5 mg Nebulization Given 08/06/13 0525)  predniSONE (DELTASONE) tablet 40 mg (40 mg Oral Given 08/06/13 0521)    New Prescriptions   LEVOFLOXACIN (LEVAQUIN) 750 MG TABLET    Take 1 tablet (750 mg total) by mouth daily.   PREDNISONE (DELTASONE) 20 MG TABLET    Take 2 tablets (40 mg total) by mouth daily.     Vida Roller, MD 08/06/13 [id number]

## 2013-08-09 ENCOUNTER — Inpatient Hospital Stay (HOSPITAL_COMMUNITY)
Admission: EM | Admit: 2013-08-09 | Discharge: 2013-08-13 | DRG: 190 | Disposition: A | Discharge status: Skilled Nursing Facility | Payer: Medicare Other | Attending: Pulmonary Disease | Admitting: Pulmonary Disease

## 2013-08-09 ENCOUNTER — Inpatient Hospital Stay (HOSPITAL_COMMUNITY): Payer: Medicare Other

## 2013-08-09 ENCOUNTER — Encounter (HOSPITAL_COMMUNITY): Payer: Self-pay

## 2013-08-09 ENCOUNTER — Emergency Department (HOSPITAL_COMMUNITY): Payer: Medicare Other

## 2013-08-09 DIAGNOSIS — J45909 Unspecified asthma, uncomplicated: Secondary | ICD-10-CM | POA: Diagnosis present

## 2013-08-09 DIAGNOSIS — I10 Essential (primary) hypertension: Secondary | ICD-10-CM | POA: Diagnosis present

## 2013-08-09 DIAGNOSIS — J9610 Chronic respiratory failure, unspecified whether with hypoxia or hypercapnia: Secondary | ICD-10-CM

## 2013-08-09 DIAGNOSIS — IMO0002 Reserved for concepts with insufficient information to code with codable children: Secondary | ICD-10-CM

## 2013-08-09 DIAGNOSIS — Z6841 Body Mass Index (BMI) 40.0 and over, adult: Secondary | ICD-10-CM

## 2013-08-09 DIAGNOSIS — Z79899 Other long term (current) drug therapy: Secondary | ICD-10-CM

## 2013-08-09 DIAGNOSIS — Z853 Personal history of malignant neoplasm of breast: Secondary | ICD-10-CM

## 2013-08-09 DIAGNOSIS — J45901 Unspecified asthma with (acute) exacerbation: Secondary | ICD-10-CM

## 2013-08-09 DIAGNOSIS — R062 Wheezing: Secondary | ICD-10-CM

## 2013-08-09 DIAGNOSIS — M810 Age-related osteoporosis without current pathological fracture: Secondary | ICD-10-CM | POA: Diagnosis present

## 2013-08-09 DIAGNOSIS — E079 Disorder of thyroid, unspecified: Secondary | ICD-10-CM | POA: Diagnosis present

## 2013-08-09 DIAGNOSIS — Z8249 Family history of ischemic heart disease and other diseases of the circulatory system: Secondary | ICD-10-CM

## 2013-08-09 DIAGNOSIS — G809 Cerebral palsy, unspecified: Secondary | ICD-10-CM

## 2013-08-09 DIAGNOSIS — M129 Arthropathy, unspecified: Secondary | ICD-10-CM | POA: Diagnosis present

## 2013-08-09 DIAGNOSIS — K5900 Constipation, unspecified: Secondary | ICD-10-CM | POA: Diagnosis present

## 2013-08-09 DIAGNOSIS — R0902 Hypoxemia: Secondary | ICD-10-CM

## 2013-08-09 DIAGNOSIS — F411 Generalized anxiety disorder: Secondary | ICD-10-CM

## 2013-08-09 DIAGNOSIS — G4700 Insomnia, unspecified: Secondary | ICD-10-CM | POA: Diagnosis present

## 2013-08-09 DIAGNOSIS — E876 Hypokalemia: Secondary | ICD-10-CM | POA: Diagnosis present

## 2013-08-09 DIAGNOSIS — J9611 Chronic respiratory failure with hypoxia: Secondary | ICD-10-CM | POA: Diagnosis present

## 2013-08-09 DIAGNOSIS — Z8673 Personal history of transient ischemic attack (TIA), and cerebral infarction without residual deficits: Secondary | ICD-10-CM

## 2013-08-09 DIAGNOSIS — K219 Gastro-esophageal reflux disease without esophagitis: Secondary | ICD-10-CM | POA: Diagnosis present

## 2013-08-09 DIAGNOSIS — Z833 Family history of diabetes mellitus: Secondary | ICD-10-CM

## 2013-08-09 DIAGNOSIS — J189 Pneumonia, unspecified organism: Secondary | ICD-10-CM

## 2013-08-09 DIAGNOSIS — J441 Chronic obstructive pulmonary disease with (acute) exacerbation: Principal | ICD-10-CM | POA: Diagnosis present

## 2013-08-09 LAB — PRO B NATRIURETIC PEPTIDE: Pro B Natriuretic peptide (BNP): 75 pg/mL (ref 0–125)

## 2013-08-09 LAB — CBC
MCH: 27.5 pg (ref 26.0–34.0)
MCHC: 29.9 g/dL — ABNORMAL LOW (ref 30.0–36.0)
Platelets: 178 10*3/uL (ref 150–400)

## 2013-08-09 LAB — BASIC METABOLIC PANEL
Calcium: 9.4 mg/dL (ref 8.4–10.5)
Creatinine, Ser: 0.49 mg/dL — ABNORMAL LOW (ref 0.50–1.10)
GFR calc Af Amer: >90 mL/min (ref >90)
GFR calc non Af Amer: >90 mL/min (ref >90)
Sodium: 142 mEq/L (ref 135–145)

## 2013-08-09 MED ORDER — ACETAMINOPHEN 650 MG RE SUPP: 650.0000 mg | Freq: Four times a day (QID) | RECTAL | Status: DC | PRN

## 2013-08-09 MED ORDER — IPRATROPIUM BROMIDE 0.02 % IN SOLN
0.5000 mg | Freq: Once | RESPIRATORY_TRACT | Status: AC
  Administered 2013-08-09: 0.5 mg via RESPIRATORY_TRACT
  Filled 2013-08-09: qty 2.5

## 2013-08-09 MED ORDER — ZOLPIDEM TARTRATE 5 MG PO TABS
5.0000 mg | ORAL_TABLET | Freq: Every day | ORAL | Status: DC
  Administered 2013-08-09 – 2013-08-12 (×4): 5 mg via ORAL
  Filled 2013-08-09 (×4): qty 1

## 2013-08-09 MED ORDER — LOSARTAN POTASSIUM 50 MG PO TABS
50.0000 mg | ORAL_TABLET | Freq: Every day | ORAL | Status: DC
  Administered 2013-08-10 – 2013-08-13 (×4): 50 mg via ORAL
  Filled 2013-08-09 (×4): qty 1

## 2013-08-09 MED ORDER — ALBUTEROL SULFATE (5 MG/ML) 0.5% IN NEBU
10.0000 mg | INHALATION_SOLUTION | Freq: Once | RESPIRATORY_TRACT | Status: AC
  Administered 2013-08-09: 10 mg via RESPIRATORY_TRACT
  Filled 2013-08-09: qty 2

## 2013-08-09 MED ORDER — ONDANSETRON HCL 4 MG PO TABS: 4.0000 mg | ORAL_TABLET | Freq: Four times a day (QID) | ORAL | Status: DC | PRN

## 2013-08-09 MED ORDER — NITROGLYCERIN 0.4 MG SL SUBL: 0.4000 mg | SUBLINGUAL_TABLET | SUBLINGUAL | Status: DC | PRN

## 2013-08-09 MED ORDER — ENOXAPARIN SODIUM 40 MG/0.4ML ~~LOC~~ SOLN
40.0000 mg | Freq: Every day | SUBCUTANEOUS | Status: DC
  Administered 2013-08-09 – 2013-08-12 (×4): 40 mg via SUBCUTANEOUS
  Filled 2013-08-09 (×5): qty 0.4

## 2013-08-09 MED ORDER — SODIUM CHLORIDE 0.9 % IV SOLN: 250.0000 mL | INTRAVENOUS | Status: DC | PRN

## 2013-08-09 MED ORDER — SODIUM CHLORIDE 0.9 % IJ SOLN
3.0000 mL | Freq: Two times a day (BID) | INTRAMUSCULAR | Status: DC
  Administered 2013-08-09 – 2013-08-12 (×3): 3 mL via INTRAVENOUS

## 2013-08-09 MED ORDER — HYDROCHLOROTHIAZIDE 12.5 MG PO CAPS
12.5000 mg | ORAL_CAPSULE | Freq: Every day | ORAL | Status: DC
  Administered 2013-08-10 – 2013-08-13 (×4): 12.5 mg via ORAL
  Filled 2013-08-09 (×4): qty 1

## 2013-08-09 MED ORDER — DEXTROSE 5 % IV SOLN
1.0000 g | Freq: Once | INTRAVENOUS | Status: AC
  Administered 2013-08-09: 1 g via INTRAVENOUS
  Filled 2013-08-09 (×2): qty 1

## 2013-08-09 MED ORDER — ALBUTEROL SULFATE (5 MG/ML) 0.5% IN NEBU
2.5000 mg | INHALATION_SOLUTION | Freq: Once | RESPIRATORY_TRACT | Status: AC
  Administered 2013-08-09: 2.5 mg via RESPIRATORY_TRACT

## 2013-08-09 MED ORDER — ONDANSETRON HCL 4 MG/2ML IJ SOLN: 4.0000 mg | Freq: Four times a day (QID) | INTRAMUSCULAR | Status: DC | PRN

## 2013-08-09 MED ORDER — MORPHINE SULFATE 4 MG/ML IJ SOLN: 4.0000 mg | Freq: Once | INTRAMUSCULAR | Status: AC

## 2013-08-09 MED ORDER — PANTOPRAZOLE SODIUM 40 MG PO TBEC
40.0000 mg | DELAYED_RELEASE_TABLET | Freq: Every day | ORAL | Status: DC
  Administered 2013-08-10 – 2013-08-13 (×4): 40 mg via ORAL
  Filled 2013-08-09 (×4): qty 1

## 2013-08-09 MED ORDER — IPRATROPIUM BROMIDE 0.02 % IN SOLN
RESPIRATORY_TRACT | Status: AC
  Administered 2013-08-09: 0.5 mg
  Filled 2013-08-09: qty 2.5

## 2013-08-09 MED ORDER — ALPRAZOLAM 0.5 MG PO TABS
0.5000 mg | ORAL_TABLET | Freq: Four times a day (QID) | ORAL | Status: DC
  Administered 2013-08-09 (×2): 0.5 mg via ORAL
  Filled 2013-08-09 (×2): qty 1

## 2013-08-09 MED ORDER — POTASSIUM CHLORIDE CRYS ER 20 MEQ PO TBCR
20.0000 meq | EXTENDED_RELEASE_TABLET | Freq: Every day | ORAL | Status: DC
  Administered 2013-08-10 – 2013-08-11 (×2): 20 meq via ORAL
  Filled 2013-08-09 (×2): qty 1

## 2013-08-09 MED ORDER — ALBUTEROL SULFATE (5 MG/ML) 0.5% IN NEBU
INHALATION_SOLUTION | RESPIRATORY_TRACT | Status: AC
  Administered 2013-08-09: 10 mg
  Filled 2013-08-09: qty 2

## 2013-08-09 MED ORDER — LOSARTAN POTASSIUM-HCTZ 50-12.5 MG PO TABS: 1.0000 | ORAL_TABLET | Freq: Every day | ORAL | Status: DC

## 2013-08-09 MED ORDER — ALBUTEROL SULFATE (5 MG/ML) 0.5% IN NEBU: 2.5000 mg | INHALATION_SOLUTION | Freq: Once | RESPIRATORY_TRACT | Status: DC

## 2013-08-09 MED ORDER — ACETAMINOPHEN 325 MG PO TABS: 650.0000 mg | ORAL_TABLET | Freq: Four times a day (QID) | ORAL | Status: DC | PRN

## 2013-08-09 MED ORDER — MORPHINE SULFATE 4 MG/ML IJ SOLN
INTRAMUSCULAR | Status: AC
  Administered 2013-08-09: 4 mg via INTRAVENOUS
  Filled 2013-08-09: qty 1

## 2013-08-09 MED ORDER — DEXTROSE 5 % IV SOLN
1.0000 g | Freq: Three times a day (TID) | INTRAVENOUS | Status: DC
  Administered 2013-08-09 – 2013-08-12 (×10): 1 g via INTRAVENOUS
  Filled 2013-08-09 (×15): qty 1

## 2013-08-09 MED ORDER — ASPIRIN 325 MG PO TABS
325.0000 mg | ORAL_TABLET | Freq: Every day | ORAL | Status: DC
Start: 2013-08-10 — End: 2013-08-13
  Administered 2013-08-10 – 2013-08-13 (×4): 325 mg via ORAL
  Filled 2013-08-09 (×4): qty 1

## 2013-08-09 MED ORDER — VANCOMYCIN HCL IN DEXTROSE 1-5 GM/200ML-% IV SOLN
1000.0000 mg | Freq: Two times a day (BID) | INTRAVENOUS | Status: DC
  Administered 2013-08-10 – 2013-08-13 (×7): 1000 mg via INTRAVENOUS
  Filled 2013-08-09 (×9): qty 200

## 2013-08-09 MED ORDER — VANCOMYCIN HCL 10 G IV SOLR
2000.0000 mg | Freq: Once | INTRAVENOUS | Status: AC
  Administered 2013-08-09: 2000 mg via INTRAVENOUS
  Filled 2013-08-09: qty 2000

## 2013-08-09 MED ORDER — ALBUTEROL SULFATE (5 MG/ML) 0.5% IN NEBU
2.5000 mg | INHALATION_SOLUTION | RESPIRATORY_TRACT | Status: DC | PRN
  Administered 2013-08-10: 2.5 mg via RESPIRATORY_TRACT
  Filled 2013-08-09: qty 0.5

## 2013-08-09 MED ORDER — LEVOTHYROXINE SODIUM 88 MCG PO TABS
88.0000 ug | ORAL_TABLET | Freq: Every day | ORAL | Status: DC
  Administered 2013-08-10 – 2013-08-13 (×4): 88 ug via ORAL
  Filled 2013-08-09 (×6): qty 1

## 2013-08-09 MED ORDER — TRAMADOL HCL 50 MG PO TABS
50.0000 mg | ORAL_TABLET | Freq: Four times a day (QID) | ORAL | Status: DC | PRN
  Administered 2013-08-10 – 2013-08-13 (×3): 50 mg via ORAL
  Filled 2013-08-09 (×3): qty 1

## 2013-08-09 MED ORDER — SERTRALINE HCL 50 MG PO TABS: 50.0000 mg | ORAL_TABLET | Freq: Every day | ORAL | Status: DC

## 2013-08-09 MED ORDER — METHYLPREDNISOLONE SODIUM SUCC 125 MG IJ SOLR
125.0000 mg | Freq: Once | INTRAMUSCULAR | Status: AC
  Administered 2013-08-09: 125 mg via INTRAVENOUS
  Filled 2013-08-09: qty 2

## 2013-08-09 MED ORDER — ALBUTEROL SULFATE (5 MG/ML) 0.5% IN NEBU
2.5000 mg | INHALATION_SOLUTION | Freq: Once | RESPIRATORY_TRACT | Status: DC
  Administered 2013-08-09: 2.5 mg via RESPIRATORY_TRACT
  Filled 2013-08-09: qty 1

## 2013-08-09 MED ORDER — SODIUM CHLORIDE 0.9 % IJ SOLN: 3.0000 mL | INTRAMUSCULAR | Status: DC | PRN

## 2013-08-09 MED ORDER — DIPHENHYDRAMINE HCL 25 MG PO CAPS: 25.0000 mg | ORAL_CAPSULE | Freq: Every day | ORAL | Status: DC

## 2013-08-09 NOTE — Progress Notes (Signed)
ANTIBIOTIC CONSULT NOTE  Pharmacy Consult for Vancomycin / Renal Adjust ABX Indication: rule out pneumonia  Allergies  Allergen Reactions  . Naproxen Other (See Comments)    unknown    Patient Measurements: Height: 5' (152.4 cm) Weight: 224 lb 13.9 oz (102 kg) IBW/kg (Calculated) : 45.5  Vital Signs: Temp: 98.4 F (36.9 C) (08/21 1455) Temp src: Oral (08/21 1455) BP: 111/73 mmHg (08/21 1455) Pulse Rate: 128 (08/21 1455) Intake/Output from previous day:   Intake/Output from this shift:    Labs:  Recent Labs  08/09/13 1147  WBC 8.7  HGB 14.0  PLT 178  CREATININE 0.49*   Estimated Creatinine Clearance: 68.3 ml/min (by C-G formula based on Cr of 0.49). No results found for this basename: VANCOTROUGH, VANCOPEAK, VANCORANDOM, GENTTROUGH, GENTPEAK, GENTRANDOM, TOBRATROUGH, TOBRAPEAK, TOBRARND, AMIKACINPEAK, AMIKACINTROU, AMIKACIN,  in the last 72 hours   Microbiology: No results found for this or any previous visit (from the past 720 hour(s)).  Medical History: Past Medical History  Diagnosis Date  . Cerebral palsy   . Asthma   . Seasonal allergies   . Thyroid disease   . HTN (hypertension)   . Anxiety   . Arthritis   . Breast cancer     Right breast, infiltrating ductal.  . Anginal pain   . Osteoporosis 05/08/2012  . Osteoporosis 05/08/2012  . Stroke   . Angina at rest   . Chronic steroid use    Medications:  Prescriptions prior to admission  Medication Sig Dispense Refill  . albuterol (PROVENTIL) (2.5 MG/3ML) 0.083% nebulizer solution Take 2.5 mg by nebulization 4 (four) times daily.      Marland Kitchen ALPRAZolam (XANAX) 0.5 MG tablet Take 1 tablet (0.5 mg total) by mouth 4 (four) times daily. nerves  120 tablet  5  . aspirin 325 MG tablet Take 1 tablet (325 mg total) by mouth daily.  30 tablet  12  . azithromycin (ZITHROMAX) 250 MG tablet Take 250 mg by mouth daily.      . diphenhydrAMINE (BENADRYL) 25 mg capsule Take 1 capsule (25 mg total) by mouth at bedtime.  30  capsule  0  . levofloxacin (LEVAQUIN) 750 MG tablet Take 1 tablet (750 mg total) by mouth daily.  7 tablet  0  . levothyroxine (SYNTHROID, LEVOTHROID) 88 MCG tablet Take 1 tablet (88 mcg total) by mouth daily before breakfast.  30 tablet  12  . loperamide (IMODIUM) 2 MG capsule Take 2 mg by mouth 4 (four) times daily as needed for diarrhea or loose stools. No more than 8 capsules in a 24 hour period      . losartan-hydrochlorothiazide (HYZAAR) 50-12.5 MG per tablet Take 1 tablet by mouth daily.        . pantoprazole (PROTONIX) 40 MG tablet Take 1 tablet (40 mg total) by mouth daily.  30 tablet  12  . polyethylene glycol (MIRALAX / GLYCOLAX) packet Take 17 g by mouth daily.      . potassium chloride SA (K-DUR,KLOR-CON) 20 MEQ tablet Take 1 tablet (20 mEq total) by mouth daily.  30 tablet  12  . predniSONE (DELTASONE) 5 MG tablet Take 1 tablet (5 mg total) by mouth daily.  30 tablet  12  . sertraline (ZOLOFT) 50 MG tablet Take 50 mg by mouth daily.      Marland Kitchen albuterol (PROVENTIL HFA;VENTOLIN HFA) 108 (90 BASE) MCG/ACT inhaler Inhale 2 puffs into the lungs every 4 (four) hours as needed. Shortness of breath  1 Inhaler  12  .  nitroGLYCERIN (NITROSTAT) 0.4 MG SL tablet Place 1 tablet (0.4 mg total) under the tongue every 5 (five) minutes as needed for chest pain.  25 tablet  12  . traMADol (ULTRAM) 50 MG tablet Take 1 tablet (50 mg total) by mouth every 6 (six) hours as needed.  60 tablet  5   Assessment: Okay for Protocol.  Previously treated with Zithromax/Levaquin. Estimated Creatinine Clearance: 68.3 ml/min (by C-G formula based on Cr of 0.49).  Cefepime 8/21 >> Vancomycin 8/21 >>  Goal of Therapy:  Vancomycin trough level 15-20 mcg/ml  Plan:  Vancomycin 2000mg  IV x 1, then 1000mg  IV every 12 hours. Measure antibiotic drug levels at steady state Follow up culture results Patient also has orders for cefepime 1gm IV every 8 hours.  Mady Gemma 08/09/2013,3:12 PM

## 2013-08-09 NOTE — H&P (Signed)
History and Physical  [name] ZOX:[id number] DOB: May 15, 1941 DOA: 08/09/2013  Referring physician: Linwood Dibbles, MD PCP: Fredirick Maudlin, MD   Chief Complaint: SOB  HPI:  72 year old woman with history of asthma presented to the emergency department with shortness of breath, wheezing and cough. Noted to be generally weak, and difficulty getting up today. Initial evaluation was notable for wheezes, shortness of breath but stable hypoxia. She was referred for admission for asthma exacerbation.  History obtained from patient, chart and Lupita Leash at [facility] long-term care. Recently seen in the emergency Department 3 days ago for shortness of breath, diagnosed with asthma exacerbation and pneumonia, placed on Levaquin and prednisone and discharged back to long-term care. Patient refused to take prednisone secondary to insomnia. Overall her respiratory status did not improve she has continued to have wheezing and shortness of breath and a "bad" cough. Over the last 24 hours she has developed increased generalized weakness and the staff had difficulty getting her up this morning, therefore she was sent back to the emergency department. The patient notes generalized weakness in her lower extremities. Some pain in her left leg and some "numbness" which is of uncertain chronicity but does not appear by history to be acute. The patient herself reports wheezing and some shortness of breath. She feels generally weak. She did not improve with antibiotics and steroids.  She has a history cerebral palsy and previous stroke and resides in long-term care. At baseline the patient requires assistance but is able to ambulate. She is on mechanical/chopped meat diet, thin liquids.  In the emergency department afebrile, tachypneic in the 20s. Mildly tachycardic. Hypoxia. Stable. Blood pressure somewhat labile. CBC and basic metabolic panel unremarkable. Chest x-ray without evidence of acute disease. She was treated with  nebulizers, steroids, antibiotics.  Review of Systems:  Negative for fever, visual changes, rash, chest pain, bleeding, vomiting, abdominal pain  Positive for chills, sore throat, dysuria, nausea   Past Medical History  Diagnosis Date  . Cerebral palsy   . Asthma   . Seasonal allergies   . Thyroid disease   . HTN (hypertension)   . Anxiety   . Arthritis   . Breast cancer     Right breast, infiltrating ductal.  . Anginal pain   . Osteoporosis 05/08/2012  . Osteoporosis 05/08/2012  . Stroke   . Angina at rest   . Chronic steroid use     Past Surgical History  Procedure Laterality Date  . Breast lumpectomy    . Radical abdominal hysterectomy    . Dental extraction    . Right hand surgery      Social History:  reports that she has never smoked. She has never used smokeless tobacco. She reports that she does not drink alcohol or use illicit drugs.  Allergies  Allergen Reactions  . Naproxen Other (See Comments)    unknown    Family History  Problem Relation Age of Onset  . Cancer    . Diabetes    . Arthritis    . Asthma    . Heart attack Father   . Heart attack Mother      Prior to Admission medications   Medication Sig Start Date End Date Taking? Authorizing Provider  albuterol (PROVENTIL) (2.5 MG/3ML) 0.083% nebulizer solution Take 2.5 mg by nebulization 4 (four) times daily.   Yes Historical Provider, MD  ALPRAZolam Prudy Feeler) 0.5 MG tablet Take 1 tablet (0.5 mg total) by mouth 4 (four) times daily. nerves 06/13/13  Yes  Fredirick Maudlin, MD  aspirin 325 MG tablet Take 1 tablet (325 mg total) by mouth daily. 06/13/13  Yes Fredirick Maudlin, MD  azithromycin (ZITHROMAX) 250 MG tablet Take 250 mg by mouth daily.   Yes Historical Provider, MD  diphenhydrAMINE (BENADRYL) 25 mg capsule Take 1 capsule (25 mg total) by mouth at bedtime. 06/13/13  Yes Fredirick Maudlin, MD  levofloxacin (LEVAQUIN) 750 MG tablet Take 1 tablet (750 mg total) by mouth daily. 08/06/13  Yes Vida Roller, MD  levothyroxine (SYNTHROID, LEVOTHROID) 88 MCG tablet Take 1 tablet (88 mcg total) by mouth daily before breakfast. 06/13/13  Yes Fredirick Maudlin, MD  loperamide (IMODIUM) 2 MG capsule Take 2 mg by mouth 4 (four) times daily as needed for diarrhea or loose stools. No more than 8 capsules in a 24 hour period   Yes Historical Provider, MD  losartan-hydrochlorothiazide (HYZAAR) 50-12.5 MG per tablet Take 1 tablet by mouth daily.     Yes Historical Provider, MD  pantoprazole (PROTONIX) 40 MG tablet Take 1 tablet (40 mg total) by mouth daily. 06/13/13  Yes Fredirick Maudlin, MD  polyethylene glycol [facility] / GLYCOLAX) packet Take 17 g by mouth daily.   Yes Historical Provider, MD  potassium chloride SA (K-DUR,KLOR-CON) 20 MEQ tablet Take 1 tablet (20 mEq total) by mouth daily. 06/13/13  Yes Fredirick Maudlin, MD  predniSONE (DELTASONE) 5 MG tablet Take 1 tablet (5 mg total) by mouth daily. 06/13/13  Yes Fredirick Maudlin, MD  sertraline (ZOLOFT) 50 MG tablet Take 50 mg by mouth daily.   Yes Historical Provider, MD  albuterol (PROVENTIL HFA;VENTOLIN HFA) 108 (90 BASE) MCG/ACT inhaler Inhale 2 puffs into the lungs every 4 (four) hours as needed. Shortness of breath 06/13/13   Fredirick Maudlin, MD  nitroGLYCERIN (NITROSTAT) 0.4 MG SL tablet Place 1 tablet (0.4 mg total) under the tongue every 5 (five) minutes as needed for chest pain. 06/13/13   Fredirick Maudlin, MD  traMADol (ULTRAM) 50 MG tablet Take 1 tablet (50 mg total) by mouth every 6 (six) hours as needed. 06/13/13   Fredirick Maudlin, MD   Physical Exam: Filed Vitals:   08/09/13 1206 08/09/13 1225 08/09/13 1245 08/09/13 1300  BP:  102/68  109/66  Pulse:  100 101   Temp: 99.6 F (37.6 C)     TempSrc: Rectal     Resp:  26 22   Height:      Weight:      SpO2:  100% 94%     General: Examined in the emergency department. Appears calm and mildly uncomfortable. She has involuntary movements of the face and some difficulty speaking secondary to  this but is easily understandable. Eyes: PERRL, normal lids, irises  ENT: Mildly hard of hearing. Lips and tongue appear unremarkable. Edentulous. Neck: no LAD, masses or thyromegaly Cardiovascular: RRR, no m/r/g. No LE edema. Respiratory: Bilateral wheezes, no rhonchi or rales. Diminished breath sounds bilaterally. Possibly mild increased respiratory effort. Abdomen: soft, ntnd, base Skin: Some bruising left flank above the buttocks Musculoskeletal: Bilateral lower extremities strength 4/5, able to lift both legs off the bed. Sensation grossly intact bilateral lower extremities. Able to use the left arm to turn in bed. Grip strength bilateral symmetric. Limited movement of her right arm. Psychiatric: grossly normal mood and affect, speech fluent and appropriate Neurologic: As above.  Wt Readings from Last 3 Encounters:  08/09/13 104.327 kg (230 lb)  08/06/13 106.595 kg (235 lb)  06/13/13 106.459 kg (234 lb 11.2 oz)    Labs on Admission:  Basic Metabolic Panel:  Recent Labs Lab 08/09/13 1147  NA 142  K 3.6  CL 97  CO2 39*  GLUCOSE 118*  BUN 15  CREATININE 0.49*  CALCIUM 9.4    CBC:  Recent Labs Lab 08/09/13 1147  WBC 8.7  HGB 14.0  HCT 46.9*  MCV 92.0  PLT 178    Recent Labs  06/11/13 0813 08/09/13 1147  PROBNP 67.7 75.0   Radiological Exams on Admission: Dg Chest Port 1 View  08/09/2013   *RADIOLOGY REPORT*  Clinical Data: Shortness of breath.  PORTABLE CHEST - 1 VIEW  Comparison: 08/06/2013.  Findings: Pulmonary vascular congestion.  Bibasilar subsegmental atelectasis greater on the right.  No segmental consolidation or gross pneumothorax.  Heart size top normal.  Slightly tortuous aorta.  IMPRESSION: Pulmonary vascular congestion.  Bibasilar subsegmental atelectasis greater on the right.  No segmental consolidation or gross pneumothorax.  Heart size top normal.  Slightly tortuous aorta.   Original Report Authenticated By: Lacy Duverney, M.D.    EKG:  Independently reviewed. Sinus rhythm. No acute changes. Poor quality study.   Principal Problem:   Asthma with acute exacerbation Active Problems:   Morbid obesity   Cerebral palsy   Asthma   Chronic respiratory failure with hypoxia   Pneumonia   Anxiety state, unspecified   Assessment/Plan 1. Asthma exacerbation: Failing to respond with outpatient treatment. Appears stable for admission to the medical floor. Continue steroids, nebulizers, antibiotics. 2. Possible pneumonia: Repeat chest x-ray without evidence of infiltrate. White count normal. Afebrile. However has been on antibiotics. Given lack of clinical improvement as an outpatient  will treat broadly, however may be able to narrow quickly. 3. Chronic respiratory failure: Continue oxygen. 4. Generalized weakness: No focal no deficit. Physical therapy evaluation. 5. Chronic left lower extremity pain: Chronicity unclear but not recent per patient. Some bruising of the left hip, she denies fall or trauma. She does require assistance to get up and thinks that this may be the cause. Check x-ray of pelvis. Doubt acute injury. Apparently she does have a history of sciatica. 6. History cerebral palsy, stroke: Appears to be at baseline. 7. Anxiety: Continue Zoloft, Xanax. 8. Morbid obesity  Code Status: Full code  DVT prophylaxis: Lovenox Family Communication: Attempted to reach sister by telephone, no answer Disposition Plan/Anticipated LOS: Admit. 2 days.  Time spent: 60 minutes  Brendia Sacks, MD  [name] Pager [phone] 08/09/2013, 2:30 PM

## 2013-08-09 NOTE — ED Provider Notes (Signed)
CSN: [id number]     Arrival date & time 08/09/13  1110 History     First MD Initiated Contact with Patient 08/09/13 1114     Chief Complaint  Patient presents with  . Shortness of Breath    HPI Patient presents to the emergency room with complaints of shortness of breath. The symptoms have been worsening over the last few days. Patient was actually seen in the emergency department 3 days ago for similar symptoms. The patient was treated with albuterol and Atrovent and was also started on antibiotics for possible pneumonia. Return back to her nursing facility but the symptoms have progressed. Patient states she feels like her something in her chest but she can't bring up.  She also feels weaker than usual.  She's not sure she's having any fevers. She denies any vomiting or diarrhea. She denies any chest pain. Patient has chronic asthma and is on 2 L nasal cannula as well as chronic steroids. Past Medical History  Diagnosis Date  . Cerebral palsy   . Asthma   . Seasonal allergies   . Thyroid disease   . HTN (hypertension)   . Anxiety   . Arthritis   . Breast cancer     Right breast, infiltrating ductal.  . Anginal pain   . Osteoporosis 05/08/2012  . Osteoporosis 05/08/2012  . Stroke   . Angina at rest   . Chronic steroid use    Past Surgical History  Procedure Laterality Date  . Breast lumpectomy    . Radical abdominal hysterectomy    . Dental extraction    . Right hand surgery     Family History  Problem Relation Age of Onset  . Cancer    . Diabetes    . Arthritis    . Asthma     History  Substance Use Topics  . Smoking status: Never Smoker   . Smokeless tobacco: Never Used  . Alcohol Use: No   OB History   Grav Para Term Preterm Abortions TAB SAB Ect Mult Living                 Review of Systems  All other systems reviewed and are negative.    Allergies  Naproxen  Home Medications   Current Outpatient Rx  Name  Route  Sig  Dispense  Refill  .  albuterol (PROVENTIL) (2.5 MG/3ML) 0.083% nebulizer solution   Nebulization   Take 2.5 mg by nebulization 4 (four) times daily.         Marland Kitchen ALPRAZolam (XANAX) 0.5 MG tablet   Oral   Take 1 tablet (0.5 mg total) by mouth 4 (four) times daily. nerves   120 tablet   5   . aspirin 325 MG tablet   Oral   Take 1 tablet (325 mg total) by mouth daily.   30 tablet   12   . azithromycin (ZITHROMAX) 250 MG tablet   Oral   Take 250 mg by mouth daily.         . diphenhydrAMINE (BENADRYL) 25 mg capsule   Oral   Take 1 capsule (25 mg total) by mouth at bedtime.   30 capsule   0   . levofloxacin (LEVAQUIN) 750 MG tablet   Oral   Take 1 tablet (750 mg total) by mouth daily.   7 tablet   0   . levothyroxine (SYNTHROID, LEVOTHROID) 88 MCG tablet   Oral   Take 1 tablet (88 mcg total) by mouth daily  before breakfast.   30 tablet   12   . loperamide (IMODIUM) 2 MG capsule   Oral   Take 2 mg by mouth 4 (four) times daily as needed for diarrhea or loose stools. No more than 8 capsules in a 24 hour period         . losartan-hydrochlorothiazide (HYZAAR) 50-12.5 MG per tablet   Oral   Take 1 tablet by mouth daily.           . pantoprazole (PROTONIX) 40 MG tablet   Oral   Take 1 tablet (40 mg total) by mouth daily.   30 tablet   12   . polyethylene glycol (MIRALAX / GLYCOLAX) packet   Oral   Take 17 g by mouth daily.         . potassium chloride SA (K-DUR,KLOR-CON) 20 MEQ tablet   Oral   Take 1 tablet (20 mEq total) by mouth daily.   30 tablet   12   . predniSONE (DELTASONE) 5 MG tablet   Oral   Take 1 tablet (5 mg total) by mouth daily.   30 tablet   12   . sertraline (ZOLOFT) 50 MG tablet   Oral   Take 50 mg by mouth daily.         Marland Kitchen albuterol (PROVENTIL HFA;VENTOLIN HFA) 108 (90 BASE) MCG/ACT inhaler   Inhalation   Inhale 2 puffs into the lungs every 4 (four) hours as needed. Shortness of breath   1 Inhaler   12   . nitroGLYCERIN (NITROSTAT) 0.4 MG SL  tablet   Sublingual   Place 1 tablet (0.4 mg total) under the tongue every 5 (five) minutes as needed for chest pain.   25 tablet   12   . traMADol (ULTRAM) 50 MG tablet   Oral   Take 1 tablet (50 mg total) by mouth every 6 (six) hours as needed.   60 tablet   5    BP 109/66  Pulse 101  Temp(Src) 99.6 F (37.6 C) (Rectal)  Resp 22  Ht 5' (1.524 m)  Wt 230 lb (104.327 kg)  BMI 44.92 kg/m2  SpO2 94% Physical Exam  Nursing note and vitals reviewed. Constitutional: She appears well-developed and well-nourished. No distress.  HENT:  Head: Normocephalic and atraumatic.  Right Ear: External ear normal.  Left Ear: External ear normal.  Eyes: Conjunctivae are normal. Right eye exhibits no discharge. Left eye exhibits no discharge. No scleral icterus.  Neck: Neck supple. No tracheal deviation present.  Cardiovascular: Normal rate, regular rhythm and intact distal pulses.   Pulmonary/Chest: Effort normal. No stridor. No respiratory distress. She has wheezes. She has no rales.  Abdominal: Soft. Bowel sounds are normal. She exhibits no distension. There is no tenderness. There is no rebound and no guarding.  Musculoskeletal: She exhibits no edema and no tenderness.  Neurological: She is alert. She has normal strength. No sensory deficit. Cranial nerve deficit:  no gross defecits noted. She exhibits normal muscle tone. She displays no seizure activity. Gait abnormal. Coordination normal.  Spasticity  Skin: Skin is warm and dry. No rash noted.  Skin is warm and moist  Psychiatric: She has a normal mood and affect.    ED Course  EKG Poor quality EKG note staff attempted several times Rate 94 Normal sinus rhythm Normal axis, normal intervals Normal ST-T waves Artifact EKG is similar in appearance to EKG dated 06/12/2013 Procedures (including critical care time)  1454 no distress at this time  the patient is having persistent wheezing  Labs Reviewed  BASIC METABOLIC PANEL -  Abnormal; Notable for the following:    CO2 39 (*)    Glucose, Bld 118 (*)    Creatinine, Ser 0.49 (*)    All other components within normal limits  CBC - Abnormal; Notable for the following:    HCT 46.9 (*)    MCHC 29.9 (*)    RDW 15.9 (*)    All other components within normal limits  PRO B NATRIURETIC PEPTIDE   Dg Chest Port 1 View  08/09/2013   *RADIOLOGY REPORT*  Clinical Data: Shortness of breath.  PORTABLE CHEST - 1 VIEW  Comparison: 08/06/2013.  Findings: Pulmonary vascular congestion.  Bibasilar subsegmental atelectasis greater on the right.  No segmental consolidation or gross pneumothorax.  Heart size top normal.  Slightly tortuous aorta.  IMPRESSION: Pulmonary vascular congestion.  Bibasilar subsegmental atelectasis greater on the right.  No segmental consolidation or gross pneumothorax.  Heart size top normal.  Slightly tortuous aorta.   Original Report Authenticated By: Lacy Duverney, M.D.   1. Asthma exacerbation     MDM  Patient has history of asthma..  she was recently seen in emergency room and treated with medications including antibiotics for possible pneumonia. X-ray does not show any worsening but the patient continues with shortness of breath.  Considering her failed outpatient treatment I will consult the medical service regarding admission for IV antibiotics, and steroids and nebulizer treatments.  Celene Kras, MD 08/09/13 1356

## 2013-08-09 NOTE — ED Notes (Signed)
Pt sent here from [facility] for evaluation of SOB. Pt is being treated for pneumonia at present

## 2013-08-10 LAB — CBC
Hemoglobin: 12.7 g/dL (ref 12.0–15.0)
MCH: 27.4 pg (ref 26.0–34.0)
MCV: 92.7 fL (ref 78.0–100.0)
RBC: 4.63 MIL/uL (ref 3.87–5.11)

## 2013-08-10 LAB — BASIC METABOLIC PANEL
CO2: 39 mEq/L — ABNORMAL HIGH (ref 19–32)
Glucose, Bld: 124 mg/dL — ABNORMAL HIGH (ref 70–99)
Potassium: 3.4 mEq/L — ABNORMAL LOW (ref 3.5–5.1)
Sodium: 143 mEq/L (ref 135–145)

## 2013-08-10 LAB — GLUCOSE, CAPILLARY: Glucose-Capillary: 97 mg/dL (ref 70–99)

## 2013-08-10 MED ORDER — ALBUTEROL SULFATE (5 MG/ML) 0.5% IN NEBU
2.5000 mg | INHALATION_SOLUTION | Freq: Four times a day (QID) | RESPIRATORY_TRACT | Status: DC
  Administered 2013-08-10 – 2013-08-13 (×12): 2.5 mg via RESPIRATORY_TRACT
  Filled 2013-08-10 (×10): qty 0.5

## 2013-08-10 MED ORDER — ALPRAZOLAM 1 MG PO TABS
1.0000 mg | ORAL_TABLET | ORAL | Status: DC | PRN
  Administered 2013-08-10 – 2013-08-13 (×11): 1 mg via ORAL
  Filled 2013-08-10 (×11): qty 1

## 2013-08-10 MED ORDER — SERTRALINE HCL 50 MG PO TABS
100.0000 mg | ORAL_TABLET | Freq: Every day | ORAL | Status: DC
  Administered 2013-08-10 – 2013-08-13 (×4): 100 mg via ORAL
  Filled 2013-08-10 (×4): qty 2

## 2013-08-10 NOTE — Clinical Social Work Note (Signed)
Patient chooses bed at [facility], sister/POA asked to contact facility, [facility] admissions notified.  Santa Genera, LCSW Clinical Social Worker [phone])

## 2013-08-10 NOTE — Progress Notes (Signed)
UR chart review completed.  

## 2013-08-10 NOTE — Clinical Social Work Psychosocial (Signed)
    Clinical Social Work Department BRIEF PSYCHOSOCIAL ASSESSMENT 08/10/2013  Patient:  [name], [name]     Account Number:  [id number]     Admit date:  08/09/2013  Clinical Social Worker:  Santa Genera, CLINICAL SOCIAL WORKER  Date/Time:  08/10/2013 11:00 AM  Referred by:  Physician  Date Referred:  08/10/2013 Referred for  SNF Placement   Other Referral:   Interview type:  Patient Other interview type:   Left message for sister by VM    PSYCHOSOCIAL DATA Living Status:  FACILITY Admitted from facility:  [facility] Level of care:  Assisted Living Primary support name:  [name] Primary support relationship to patient:  SIBLING Degree of support available:   Was at [facility] ALF, now recommended to transfer to SNF. Sister is POA per patient.    CURRENT CONCERNS Current Concerns  Post-Acute Placement   Other Concerns:    SOCIAL WORK ASSESSMENT / PLAN CSW met w patient at bedside.  Patient has been at [facility]F approx 2 months, wants to return if possible. Speech is somewhat impaired, patient has cerebral palsy. Patient says her sister, [name], is her POA and patient wants her consulted on decisions about placement. Patient has son who lives in [place].  Spoke w [employer] staff, sister has been involved w patient's decision making at their facility and is POA.  CSW explained placement process, patient agreeable and willing to participate.  CSW attempted to call sister, was unable to reach and left VM.   Assessment/plan status:  Psychosocial Support/Ongoing Assessment of Needs Other assessment/ plan:   Information/referral to community resources:   SNF list    PATIENT'S/FAMILY'S RESPONSE TO PLAN OF CARE: Patient willing to participate in discharge planning process.    Santa Genera, LCSW Clinical Social Worker [phone])

## 2013-08-10 NOTE — Clinical Social Work Placement (Signed)
    Clinical Social Work Department CLINICAL SOCIAL WORK PLACEMENT NOTE 08/13/2013  Patient:  [name], [name]  Account Number:  [id number] Admit date:  08/09/2013  Clinical Social Worker:  Santa Genera, CLINICAL SOCIAL WORKER  Date/time:  08/10/2013 12:00 N  Clinical Social Work is seeking post-discharge placement for this patient at the following level of care:   SKILLED NURSING   (*CSW will update this form in Epic as items are completed)   08/10/2013  Patient/family provided with [facility] Department of Clinical Social Work's list of facilities offering this level of care within the geographic area requested by the patient (or if unable, by the patient's family).  08/10/2013  Patient/family informed of their freedom to choose among providers that offer the needed level of care, that participate in Medicare, Medicaid or managed care program needed by the patient, have an available bed and are willing to accept the patient.  08/10/2013  Patient/family informed of [facility]' ownership interest in [facility], as well as of the fact that they are under no obligation to receive care at this facility.  PASARR submitted to EDS on 08/10/2013 PASARR number received from EDS on 08/10/2013  FL2 transmitted to all facilities in geographic area requested by pt/family on  08/10/2013 FL2 transmitted to all facilities within larger geographic area on   Patient informed that his/her managed care company has contracts with or will negotiate with  certain facilities, including the following:     Patient/family informed of bed offers received:  08/10/2013 Patient chooses bed at [facility] [place] Physician recommends and patient chooses bed at    Patient to be transferred to [facility]  on  08/13/2013 Patient to be transferred to facility by [facility]  The following physician request were entered in Epic:   Additional Comments: Sister/POA asked to call  [facility] admissions and [place], [place] Clinical Social Worker [phone])

## 2013-08-10 NOTE — Care Management Note (Signed)
    Page 1 of 1   08/10/2013     1:27:55 PM   CARE MANAGEMENT NOTE 08/10/2013  Patient:  [name], [name]   Account Number:  [id number]  Date Initiated:  08/10/2013  Documentation initiated by:  Sharrie Rothman  Subjective/Objective Assessment:   Pt admitted from [facility] with COPD and asthma exacerbation. Pt will need SNF at discharge per PT recommendations.     Action/Plan:   CSW to arrange discharge to facility when medically stable.   Anticipated DC Date:  08/14/2013   Anticipated DC Plan:  SKILLED NURSING FACILITY  In-house referral  Clinical Social Worker      DC Planning Services  CM consult      Choice offered to / List presented to:             Status of service:  Completed, signed off Medicare Important Message given?   (If response is "NO", the following Medicare IM given date fields will be blank) Date Medicare IM given:   Date Additional Medicare IM given:    Discharge Disposition:    Per UR Regulation:    If discussed at Long Length of Stay Meetings, dates discussed:    Comments:  08/10/13 1330 Arlyss Queen, RN BSN CM

## 2013-08-10 NOTE — Evaluation (Signed)
Physical Therapy Evaluation Patient Details Name: [name] MRN: [id number] DOB: 10/10/1941 Today's Date: 08/10/2013 Time: 7829-5621 PT Time Calculation (min): 46 min  PT Assessment / Plan / Recommendation History of Present Illness  Pt is a resident of [facility] and admitted for exacerbation of COPD/asthma.  She has cerebral palsy and needs assist with all ADLs but has been ambulatory with a walker.  Clinical Impression   Pt was seen for evaluation and found to be deconditioned from baseline.  She now requires mod assistance to transfer bed to chair with a walker and is unable to ambulate.  She would benefit from SNF to regain ambulatory proficiency.    PT Assessment  Patient needs continued PT services    Follow Up Recommendations  SNF    Does the patient have the potential to tolerate intense rehabilitation      Barriers to Discharge Decreased caregiver support [facility] cannot provide the assistance that is currently needed    Equipment Recommendations  None recommended by PT    Recommendations for Other Services     Frequency Min 3X/week    Precautions / Restrictions Precautions Precautions: Fall Restrictions Weight Bearing Restrictions: No   Pertinent Vitals/Pain       Mobility  Bed Mobility Bed Mobility: Supine to Sit Supine to Sit: 3: Mod assist;HOB elevated Transfers Transfers: Sit to Stand;Stand to Sit;Stand Pivot Transfers Sit to Stand: 3: Mod assist;With upper extremity assist;From bed;From chair/3-in-1 Stand to Sit: 3: Mod assist;To chair/3-in-1 Stand Pivot Transfers: 3: Mod assist Details for Transfer Assistance: pt used a walker to transfer BSC to chair but was very dyspneic and fatigued Ambulation/Gait Ambulation/Gait Assistance: Not tested (comment) (unable)    Exercises     PT Diagnosis: Difficulty walking;Generalized weakness  PT Problem List: Decreased strength;Decreased activity tolerance;Decreased mobility;Cardiopulmonary status limiting  activity;Obesity PT Treatment Interventions: Gait training;Functional mobility training;Therapeutic activities;Therapeutic exercise     PT Goals(Current goals can be found in the care plan section) Acute Rehab PT Goals Patient Stated Goal: agreeable to SNF in order to regain ambulatory skills PT Goal Formulation: With patient Time For Goal Achievement: 08/24/13 Potential to Achieve Goals: Good  Visit Information  Last PT Received On: 08/10/13 History of Present Illness: Pt is a resident of [facility] and admitted for exacerbation of COPD/asthma.  She has cerebral palsy and needs assist with all ADLs but has been ambulatory with a walker.       Prior Functioning  Home Living Family/patient expects to be discharged to:: Skilled nursing facility Prior Function Level of Independence: Needs assistance Gait / Transfers Assistance Needed: pt had been able to ambulate with a walker, I do not know how much assistance needed ADL's / Homemaking Assistance Needed: full assistance with all ADLs Communication Communication: [facility]    Cognition  Cognition Arousal/Alertness: Awake/alert Behavior During Therapy: WFL for tasks assessed/performed Overall Cognitive Status: Within Functional Limits for tasks assessed    Extremity/Trunk Assessment Upper Extremity Assessment Upper Extremity Assessment: Generalized weakness Lower Extremity Assessment Lower Extremity Assessment: Generalized weakness Cervical / Trunk Assessment Cervical / Trunk Assessment: Kyphotic   Balance Balance Balance Assessed: Yes Static Sitting Balance Static Sitting - Balance Support: Feet supported;No upper extremity supported Static Sitting - Level of Assistance: 6: Modified independent (Device/Increase time)  End of Session PT - End of Session Equipment Utilized During Treatment: Gait belt Activity Tolerance: Patient limited by fatigue Patient left: in chair;with call bell/phone within reach;with chair alarm set Nurse  Communication: Mobility status  GP  Myrlene Broker L 08/10/2013, 9:22 AM

## 2013-08-11 LAB — LEGIONELLA ANTIGEN, URINE

## 2013-08-11 MED ORDER — POTASSIUM CHLORIDE CRYS ER 20 MEQ PO TBCR
20.0000 meq | EXTENDED_RELEASE_TABLET | Freq: Two times a day (BID) | ORAL | Status: DC
  Administered 2013-08-11 – 2013-08-13 (×4): 20 meq via ORAL
  Filled 2013-08-11 (×4): qty 1

## 2013-08-11 NOTE — Progress Notes (Signed)
This note is to document my visit of the 22nd. I saw her in room 320 at about 9:00 in the morning. She was finishing her physical therapy evaluation. She was complaining of shortness of breath and weakness. She had no other new complaints.  Her physical examination shows an obese female who is in moderate distress. She has chronic changes from cerebral palsy. She is edentulous. Her neck is supple. Her chest shows bilateral wheezes and rhonchi. Her heart is regular with mild tachycardia. She does not have peripheral edema. Her abdomen is soft she appears anxious  She has appears to be COPD exacerbation. She is significantly more deconditioned than previously. Skilled care facility is recommended.  She needs to continue her current medications and when she is improved enough she can be transferred to skilled care facility when a bed is available

## 2013-08-11 NOTE — Progress Notes (Signed)
Subjective: [name] says [name] feels better. [name] is still having shortness of breath. [name] is still very anxious.  Objective: Vital signs in last 24 hours: Temp:  [98.4 F (36.9 C)-98.7 F (37.1 C)] 98.4 F (36.9 C) (08/23 0504) Pulse Rate:  [80-91] 82 (08/23 0504) Resp:  [18] 18 (08/23 0504) BP: (95-125)/(37-68) 96/62 mmHg (08/23 0504) SpO2:  [90 %-96 %] 92 % (08/23 0731) Weight change:  Last BM Date: 08/10/13  Intake/Output from previous day: 08/22 0701 - 08/23 0700 In: 1390 [P.O.:590; IV Piggyback:800] Out: -   PHYSICAL EXAM General appearance: alert, cooperative, mild distress and morbidly obese Resp: rhonchi bilaterally Cardio: regular rate and rhythm, S1, S2 normal, no murmur, click, rub or gallop GI: soft, non-tender; bowel sounds normal; no masses,  no organomegaly Extremities: extremities normal, atraumatic, no cyanosis or edema  Lab Results:    Basic Metabolic Panel:  Recent Labs  47/82/95 1147 08/10/13 0509  NA 142 143  K 3.6 3.4*  CL 97 99  CO2 39* 39*  GLUCOSE 118* 124*  BUN 15 18  CREATININE 0.49* 0.47*  CALCIUM 9.4 8.6   Liver Function Tests: No results found for this basename: AST, ALT, ALKPHOS, BILITOT, PROT, ALBUMIN,  in the last 72 hours No results found for this basename: LIPASE, AMYLASE,  in the last 72 hours No results found for this basename: AMMONIA,  in the last 72 hours CBC:  Recent Labs  08/09/13 1147 08/10/13 0509  WBC 8.7 7.2  HGB 14.0 12.7  HCT 46.9* 42.9  MCV 92.0 92.7  PLT 178 174   Cardiac Enzymes: No results found for this basename: CKTOTAL, CKMB, CKMBINDEX, TROPONINI,  in the last 72 hours BNP:  Recent Labs  08/09/13 1147  PROBNP 75.0   D-Dimer: No results found for this basename: DDIMER,  in the last 72 hours CBG:  Recent Labs  08/10/13 0811 08/10/13 1202  GLUCAP 107* 97   Hemoglobin A1C: No results found for this basename: HGBA1C,  in the last 72 hours Fasting Lipid Panel: No results found for this  basename: CHOL, HDL, LDLCALC, TRIG, CHOLHDL, LDLDIRECT,  in the last 72 hours Thyroid Function Tests: No results found for this basename: TSH, T4TOTAL, FREET4, T3FREE, THYROIDAB,  in the last 72 hours Anemia Panel: No results found for this basename: VITAMINB12, FOLATE, FERRITIN, TIBC, IRON, RETICCTPCT,  in the last 72 hours Coagulation: No results found for this basename: LABPROT, INR,  in the last 72 hours Urine Drug Screen: Drugs of Abuse  No results found for this basename: labopia, cocainscrnur, labbenz, amphetmu, thcu, labbarb    Alcohol Level: No results found for this basename: ETH,  in the last 72 hours Urinalysis: No results found for this basename: COLORURINE, APPERANCEUR, LABSPEC, PHURINE, GLUCOSEU, HGBUR, BILIRUBINUR, KETONESUR, PROTEINUR, UROBILINOGEN, NITRITE, LEUKOCYTESUR,  in the last 72 hours Misc. Labs:  ABGS No results found for this basename: PHART, PCO2, PO2ART, TCO2, HCO3,  in the last 72 hours CULTURES No results found for this or any previous visit (from the past 240 hour(s)). Studies/Results: Dg Pelvis Portable  08/09/2013   *RADIOLOGY REPORT*  Clinical Data: Pain left leg, bruising left flank and hip, history stroke  PORTABLE PELVIS  Comparison: Portable exam 1535 hours without priors for comparison  Findings: Examination limited by body habitus and portable technique. Hip and SI joints appears preserved. Bones demineralized. No gross evidence of fracture or dislocation on limited exam.  IMPRESSION: Limited exam showing no gross evidence of fracture or dislocation.   Original  Report Authenticated By: Ulyses Southward, M.D.   Dg Chest Port 1 View  08/09/2013   *RADIOLOGY REPORT*  Clinical Data: Shortness of breath.  PORTABLE CHEST - 1 VIEW  Comparison: 08/06/2013.  Findings: Pulmonary vascular congestion.  Bibasilar subsegmental atelectasis greater on the right.  No segmental consolidation or gross pneumothorax.  Heart size top normal.  Slightly tortuous aorta.   IMPRESSION: Pulmonary vascular congestion.  Bibasilar subsegmental atelectasis greater on the right.  No segmental consolidation or gross pneumothorax.  Heart size top normal.  Slightly tortuous aorta.   Original Report Authenticated By: Lacy Duverney, M.D.    Medications:  Prior to Admission:  Prescriptions prior to admission  Medication Sig Dispense Refill  . albuterol (PROVENTIL) (2.5 MG/3ML) 0.083% nebulizer solution Take 2.5 mg by nebulization 4 (four) times daily.      Marland Kitchen ALPRAZolam (XANAX) 0.5 MG tablet Take 1 tablet (0.5 mg total) by mouth 4 (four) times daily. nerves  120 tablet  5  . aspirin 325 MG tablet Take 1 tablet (325 mg total) by mouth daily.  30 tablet  12  . azithromycin (ZITHROMAX) 250 MG tablet Take 250 mg by mouth daily.      . diphenhydrAMINE (BENADRYL) 25 mg capsule Take 1 capsule (25 mg total) by mouth at bedtime.  30 capsule  0  . levofloxacin (LEVAQUIN) 750 MG tablet Take 1 tablet (750 mg total) by mouth daily.  7 tablet  0  . levothyroxine (SYNTHROID, LEVOTHROID) 88 MCG tablet Take 1 tablet (88 mcg total) by mouth daily before breakfast.  30 tablet  12  . loperamide (IMODIUM) 2 MG capsule Take 2 mg by mouth 4 (four) times daily as needed for diarrhea or loose stools. No more than 8 capsules in a 24 hour period      . losartan-hydrochlorothiazide (HYZAAR) 50-12.5 MG per tablet Take 1 tablet by mouth daily.        . pantoprazole (PROTONIX) 40 MG tablet Take 1 tablet (40 mg total) by mouth daily.  30 tablet  12  . polyethylene glycol (MIRALAX / GLYCOLAX) packet Take 17 g by mouth daily.      . potassium chloride SA (K-DUR,KLOR-CON) 20 MEQ tablet Take 1 tablet (20 mEq total) by mouth daily.  30 tablet  12  . predniSONE (DELTASONE) 5 MG tablet Take 1 tablet (5 mg total) by mouth daily.  30 tablet  12  . sertraline (ZOLOFT) 50 MG tablet Take 50 mg by mouth daily.      Marland Kitchen albuterol (PROVENTIL HFA;VENTOLIN HFA) 108 (90 BASE) MCG/ACT inhaler Inhale 2 puffs into the lungs every 4  (four) hours as needed. Shortness of breath  1 Inhaler  12  . nitroGLYCERIN (NITROSTAT) 0.4 MG SL tablet Place 1 tablet (0.4 mg total) under the tongue every 5 (five) minutes as needed for chest pain.  25 tablet  12  . traMADol (ULTRAM) 50 MG tablet Take 1 tablet (50 mg total) by mouth every 6 (six) hours as needed.  60 tablet  5   Scheduled: . albuterol  2.5 mg Nebulization QID  . aspirin  325 mg Oral Daily  . ceFEPime (MAXIPIME) IV  1 g Intravenous Q8H  . enoxaparin (LOVENOX) injection  40 mg Subcutaneous QHS  . losartan  50 mg Oral Daily   And  . hydrochlorothiazide  12.5 mg Oral Daily  . levothyroxine  88 mcg Oral QAC breakfast  . pantoprazole  40 mg Oral Daily  . potassium chloride SA  20 mEq Oral  Daily  . sertraline  100 mg Oral Daily  . sodium chloride  3 mL Intravenous Q12H  . vancomycin  1,000 mg Intravenous Q12H  . zolpidem  5 mg Oral QHS   Continuous:  EAV:[id number] chloride, acetaminophen, acetaminophen, albuterol, ALPRAZolam, nitroGLYCERIN, ondansetron (ZOFRAN) IV, ondansetron, sodium chloride, traMADol  Assesment: [name] has multiple medical problems including asthma/COPD. [name] has chronic respiratory failure. [name] has morbid obesity which is unchanged. [name] has what appears to be community-acquired pneumonia. [name] has been very anxious and that is about the same. [name] has cerebral palsy which of course is unchanged Principal Problem:   Asthma with acute exacerbation Active Problems:   Morbid obesity   Cerebral palsy   Asthma   Chronic respiratory failure with hypoxia   Pneumonia   Anxiety state, unspecified    Plan: Continue current treatments    LOS: 2 days   Melia Hopes L 08/11/2013, 9:13 AM

## 2013-08-12 MED ORDER — METHYLPREDNISOLONE SODIUM SUCC 40 MG IJ SOLR
40.0000 mg | Freq: Four times a day (QID) | INTRAMUSCULAR | Status: DC
  Administered 2013-08-12 – 2013-08-13 (×5): 40 mg via INTRAVENOUS
  Filled 2013-08-12 (×5): qty 1

## 2013-08-12 MED ORDER — MAGNESIUM HYDROXIDE 400 MG/5ML PO SUSP
30.0000 mL | Freq: Every evening | ORAL | Status: DC | PRN
  Administered 2013-08-12: 30 mL via ORAL
  Filled 2013-08-12: qty 30

## 2013-08-12 NOTE — Progress Notes (Signed)
Subjective: She says she feels a little better. She has no new complaints. She is still wheezing. She is constipated.  Objective: Vital signs in last 24 hours: Temp:  [98.3 F (36.8 C)-98.9 F (37.2 C)] 98.4 F (36.9 C) (08/24 0615) Pulse Rate:  [83-86] 83 (08/24 0615) Resp:  [18-20] 20 (08/24 0615) BP: (110-113)/(68-73) 111/71 mmHg (08/24 0615) SpO2:  [90 %-93 %] 93 % (08/24 0740) Weight change:  Last BM Date: 08/10/13  Intake/Output from previous day: 08/23 0701 - 08/24 0700 In: 600 [P.O.:600] Out: -   PHYSICAL EXAM General appearance: alert, cooperative, mild distress and morbidly obese Resp: rhonchi bilaterally Cardio: regular rate and rhythm, S1, S2 normal, no murmur, click, rub or gallop GI: soft, non-tender; bowel sounds normal; no masses,  no organomegaly Extremities: extremities normal, atraumatic, no cyanosis or edema  Lab Results:    Basic Metabolic Panel:  Recent Labs  16/10/96 1147 08/10/13 0509  NA 142 143  K 3.6 3.4*  CL 97 99  CO2 39* 39*  GLUCOSE 118* 124*  BUN 15 18  CREATININE 0.49* 0.47*  CALCIUM 9.4 8.6   Liver Function Tests: No results found for this basename: AST, ALT, ALKPHOS, BILITOT, PROT, ALBUMIN,  in the last 72 hours No results found for this basename: LIPASE, AMYLASE,  in the last 72 hours No results found for this basename: AMMONIA,  in the last 72 hours CBC:  Recent Labs  08/09/13 1147 08/10/13 0509  WBC 8.7 7.2  HGB 14.0 12.7  HCT 46.9* 42.9  MCV 92.0 92.7  PLT 178 174   Cardiac Enzymes: No results found for this basename: CKTOTAL, CKMB, CKMBINDEX, TROPONINI,  in the last 72 hours BNP:  Recent Labs  08/09/13 1147  PROBNP 75.0   D-Dimer: No results found for this basename: DDIMER,  in the last 72 hours CBG:  Recent Labs  08/10/13 0811 08/10/13 1202  GLUCAP 107* 97   Hemoglobin A1C: No results found for this basename: HGBA1C,  in the last 72 hours Fasting Lipid Panel: No results found for this  basename: CHOL, HDL, LDLCALC, TRIG, CHOLHDL, LDLDIRECT,  in the last 72 hours Thyroid Function Tests: No results found for this basename: TSH, T4TOTAL, FREET4, T3FREE, THYROIDAB,  in the last 72 hours Anemia Panel: No results found for this basename: VITAMINB12, FOLATE, FERRITIN, TIBC, IRON, RETICCTPCT,  in the last 72 hours Coagulation: No results found for this basename: LABPROT, INR,  in the last 72 hours Urine Drug Screen: Drugs of Abuse  No results found for this basename: labopia, cocainscrnur, labbenz, amphetmu, thcu, labbarb    Alcohol Level: No results found for this basename: ETH,  in the last 72 hours Urinalysis: No results found for this basename: COLORURINE, APPERANCEUR, LABSPEC, PHURINE, GLUCOSEU, HGBUR, BILIRUBINUR, KETONESUR, PROTEINUR, UROBILINOGEN, NITRITE, LEUKOCYTESUR,  in the last 72 hours Misc. Labs:  ABGS No results found for this basename: PHART, PCO2, PO2ART, TCO2, HCO3,  in the last 72 hours CULTURES No results found for this or any previous visit (from the past 240 hour(s)). Studies/Results: No results found.  Medications:  Prior to Admission:  Prescriptions prior to admission  Medication Sig Dispense Refill  . albuterol (PROVENTIL) (2.5 MG/3ML) 0.083% nebulizer solution Take 2.5 mg by nebulization 4 (four) times daily.      Marland Kitchen ALPRAZolam (XANAX) 0.5 MG tablet Take 1 tablet (0.5 mg total) by mouth 4 (four) times daily. nerves  120 tablet  5  . aspirin 325 MG tablet Take 1 tablet (325 mg total)  by mouth daily.  30 tablet  12  . azithromycin (ZITHROMAX) 250 MG tablet Take 250 mg by mouth daily.      . diphenhydrAMINE (BENADRYL) 25 mg capsule Take 1 capsule (25 mg total) by mouth at bedtime.  30 capsule  0  . levofloxacin (LEVAQUIN) 750 MG tablet Take 1 tablet (750 mg total) by mouth daily.  7 tablet  0  . levothyroxine (SYNTHROID, LEVOTHROID) 88 MCG tablet Take 1 tablet (88 mcg total) by mouth daily before breakfast.  30 tablet  12  . loperamide (IMODIUM) 2  MG capsule Take 2 mg by mouth 4 (four) times daily as needed for diarrhea or loose stools. No more than 8 capsules in a 24 hour period      . losartan-hydrochlorothiazide (HYZAAR) 50-12.5 MG per tablet Take 1 tablet by mouth daily.        . pantoprazole (PROTONIX) 40 MG tablet Take 1 tablet (40 mg total) by mouth daily.  30 tablet  12  . polyethylene glycol (MIRALAX / GLYCOLAX) packet Take 17 g by mouth daily.      . potassium chloride SA (K-DUR,KLOR-CON) 20 MEQ tablet Take 1 tablet (20 mEq total) by mouth daily.  30 tablet  12  . predniSONE (DELTASONE) 5 MG tablet Take 1 tablet (5 mg total) by mouth daily.  30 tablet  12  . sertraline (ZOLOFT) 50 MG tablet Take 50 mg by mouth daily.      Marland Kitchen albuterol (PROVENTIL HFA;VENTOLIN HFA) 108 (90 BASE) MCG/ACT inhaler Inhale 2 puffs into the lungs every 4 (four) hours as needed. Shortness of breath  1 Inhaler  12  . nitroGLYCERIN (NITROSTAT) 0.4 MG SL tablet Place 1 tablet (0.4 mg total) under the tongue every 5 (five) minutes as needed for chest pain.  25 tablet  12  . traMADol (ULTRAM) 50 MG tablet Take 1 tablet (50 mg total) by mouth every 6 (six) hours as needed.  60 tablet  5   Scheduled: . albuterol  2.5 mg Nebulization QID  . aspirin  325 mg Oral Daily  . ceFEPime (MAXIPIME) IV  1 g Intravenous Q8H  . enoxaparin (LOVENOX) injection  40 mg Subcutaneous QHS  . losartan  50 mg Oral Daily   And  . hydrochlorothiazide  12.5 mg Oral Daily  . levothyroxine  88 mcg Oral QAC breakfast  . pantoprazole  40 mg Oral Daily  . potassium chloride SA  20 mEq Oral BID  . sertraline  100 mg Oral Daily  . sodium chloride  3 mL Intravenous Q12H  . vancomycin  1,000 mg Intravenous Q12H  . zolpidem  5 mg Oral QHS   Continuous:  JXB:JYNWGN chloride, acetaminophen, acetaminophen, albuterol, ALPRAZolam, nitroGLYCERIN, ondansetron (ZOFRAN) IV, ondansetron, sodium chloride, traMADol  Assesment: She was admitted with asthma. She has acute on chronic respiratory  failure with hypoxia. She has pneumonia. Her anxieties about the same. She is physically deconditioned Principal Problem:   Asthma with acute exacerbation Active Problems:   Morbid obesity   Cerebral palsy   Asthma   Chronic respiratory failure with hypoxia   Pneumonia   Anxiety state, unspecified    Plan:add solu medrol.  Continue  rx    LOS: 3 days   Neely Kammerer L 08/12/2013, 10:17 AM

## 2013-08-13 LAB — BASIC METABOLIC PANEL
GFR calc Af Amer: >90 mL/min (ref >90)
GFR calc non Af Amer: >90 mL/min (ref >90)
Potassium: 4.4 mEq/L (ref 3.5–5.1)
Sodium: 137 mEq/L (ref 135–145)

## 2013-08-13 MED ORDER — ALPRAZOLAM 1 MG PO TABS: 1.0000 mg | ORAL_TABLET | ORAL | Status: DC | PRN

## 2013-08-13 MED ORDER — ACETAMINOPHEN 325 MG PO TABS: 650.0000 mg | ORAL_TABLET | Freq: Four times a day (QID) | ORAL | Status: DC | PRN

## 2013-08-13 MED ORDER — SERTRALINE HCL 100 MG PO TABS: 100.0000 mg | ORAL_TABLET | Freq: Every day | ORAL | Status: DC

## 2013-08-13 MED ORDER — CEPHALEXIN 500 MG PO CAPS: 500.0000 mg | ORAL_CAPSULE | Freq: Four times a day (QID) | ORAL | Status: DC

## 2013-08-13 MED ORDER — ENOXAPARIN SODIUM 40 MG/0.4ML ~~LOC~~ SOLN: 40.0000 mg | Freq: Every day | SUBCUTANEOUS | Status: DC

## 2013-08-13 MED ORDER — POTASSIUM CHLORIDE CRYS ER 20 MEQ PO TBCR: 20.0000 meq | EXTENDED_RELEASE_TABLET | Freq: Two times a day (BID) | ORAL | Status: DC

## 2013-08-13 MED ORDER — METHYLPREDNISOLONE (PAK) 4 MG PO TABS: ORAL_TABLET | ORAL | Status: DC

## 2013-08-13 MED ORDER — AZITHROMYCIN 250 MG PO TABS: ORAL_TABLET | ORAL | Status: AC

## 2013-08-13 MED ORDER — ZOLPIDEM TARTRATE 5 MG PO TABS: 5.0000 mg | ORAL_TABLET | Freq: Every day | ORAL | Status: DC

## 2013-08-13 MED ORDER — MAGNESIUM HYDROXIDE 400 MG/5ML PO SUSP: 30.0000 mL | Freq: Every evening | ORAL | Status: DC | PRN

## 2013-08-13 MED ORDER — ONDANSETRON HCL 4 MG PO TABS: 4.0000 mg | ORAL_TABLET | Freq: Four times a day (QID) | ORAL | Status: DC | PRN

## 2013-08-13 NOTE — Progress Notes (Signed)
Report called to [facility]. 

## 2013-08-13 NOTE — Progress Notes (Signed)
CRITICAL VALUE ALERT  Critical value received:    Date of notification:  08/13/2013   Time of notification:  0543  Critical value read back:yes  Nurse who received alert:  CW  MD notified (1st page):  Fanta   Time of first page:  0550  MD notified (2nd page):  Time of second page:  Responding MD:  Felecia Shelling  Time MD responded:  Wileen.Kindler   MD notified, did not give any new orders.

## 2013-08-13 NOTE — Progress Notes (Signed)
Pt transported by stretcher via EMS to [facility].  Pt packet provided to EMS.  Pt's IV removed. Site WNL.  Pt stable at time of discharge.

## 2013-08-13 NOTE — Progress Notes (Signed)
UR chart review completed.  

## 2013-08-13 NOTE — Discharge Summary (Signed)
Physician Discharge Summary  Patient ID: [name] MRN: [id number] DOB/AGE: 72-01-1941 72 y.o. Primary Care Physician:Crystie Yanko L, MD Admit date: 08/09/2013 Discharge date: 08/13/2013    Discharge Diagnoses:  Principal Problem:   Asthma with acute exacerbation Active Problems:   Morbid obesity   Cerebral palsy   Asthma   Chronic respiratory failure with hypoxia   Pneumonia   Anxiety state, unspecified  GERD Constipation Coronary artery occlusive disease Breast cancer Physical deconditioning Hypokalemia    Medication List    STOP taking these medications       levofloxacin 750 MG tablet  Commonly known as:  LEVAQUIN      TAKE these medications       acetaminophen 325 MG tablet  Commonly known as:  TYLENOL  Take 2 tablets (650 mg total) by mouth every 6 (six) hours as needed.     albuterol 108 (90 BASE) MCG/ACT inhaler  Commonly known as:  PROVENTIL HFA;VENTOLIN HFA  Inhale 2 puffs into the lungs every 4 (four) hours as needed. Shortness of breath     albuterol (2.5 MG/3ML) 0.083% nebulizer solution  Commonly known as:  PROVENTIL  Take 2.5 mg by nebulization 4 (four) times daily.     ALPRAZolam 1 MG tablet  Commonly known as:  XANAX  Take 1 tablet (1 mg total) by mouth every 4 (four) hours as needed for anxiety.     aspirin 325 MG tablet  Take 1 tablet (325 mg total) by mouth daily.     azithromycin 250 MG tablet  Commonly known as:  ZITHROMAX Z-PAK  Take 2 tablets (500 mg) on  Day 1,  followed by 1 tablet (250 mg) once daily on Days 2 through 5.     cephALEXin 500 MG capsule  Commonly known as:  KEFLEX  Take 1 capsule (500 mg total) by mouth 4 (four) times daily.     diphenhydrAMINE 25 mg capsule  Commonly known as:  BENADRYL  Take 1 capsule (25 mg total) by mouth at bedtime.     enoxaparin 40 MG/0.4ML injection  Commonly known as:  LOVENOX  Inject 0.4 mLs (40 mg total) into the skin at bedtime.     levothyroxine 88 MCG tablet  Commonly  known as:  SYNTHROID, LEVOTHROID  Take 1 tablet (88 mcg total) by mouth daily before breakfast.     loperamide 2 MG capsule  Commonly known as:  IMODIUM  Take 2 mg by mouth 4 (four) times daily as needed for diarrhea or loose stools. No more than 8 capsules in a 24 hour period     losartan-hydrochlorothiazide 50-12.5 MG per tablet  Commonly known as:  HYZAAR  Take 1 tablet by mouth daily.     magnesium hydroxide 400 MG/5ML suspension  Commonly known as:  MILK OF MAGNESIA  Take 30 mLs by mouth at bedtime as needed.     methylPREDNIsolone 4 MG tablet  Commonly known as:  MEDROL DOSPACK  follow package directions     nitroGLYCERIN 0.4 MG SL tablet  Commonly known as:  NITROSTAT  Place 1 tablet (0.4 mg total) under the tongue every 5 (five) minutes as needed for chest pain.     ondansetron 4 MG tablet  Commonly known as:  ZOFRAN  Take 1 tablet (4 mg total) by mouth every 6 (six) hours as needed for nausea.     pantoprazole 40 MG tablet  Commonly known as:  PROTONIX  Take 1 tablet (40 mg total) by mouth daily.  polyethylene glycol packet  Commonly known as:  MIRALAX / GLYCOLAX  Take 17 g by mouth daily.     potassium chloride SA 20 MEQ tablet  Commonly known as:  K-DUR,KLOR-CON  Take 1 tablet (20 mEq total) by mouth 2 (two) times daily.     predniSONE 5 MG tablet  Commonly known as:  DELTASONE  Take 1 tablet (5 mg total) by mouth daily.     sertraline 100 MG tablet  Commonly known as:  ZOLOFT  Take 1 tablet (100 mg total) by mouth daily.     traMADol 50 MG tablet  Commonly known as:  ULTRAM  Take 1 tablet (50 mg total) by mouth every 6 (six) hours as needed.     zolpidem 5 MG tablet  Commonly known as:  AMBIEN  Take 1 tablet (5 mg total) by mouth at bedtime.        Discharged Condition:improved    Consults:none  Significant Diagnostic Studies: Dg Pelvis Portable  08/09/2013   *RADIOLOGY REPORT*  Clinical Data: Pain left leg, bruising left flank and hip,  history stroke  PORTABLE PELVIS  Comparison: Portable exam 1535 hours without priors for comparison  Findings: Examination limited by body habitus and portable technique. Hip and SI joints appears preserved. Bones demineralized. No gross evidence of fracture or dislocation on limited exam.  IMPRESSION: Limited exam showing no gross evidence of fracture or dislocation.   Original Report Authenticated By: Ulyses Southward, M.D.   Dg Chest Port 1 View  08/09/2013   *RADIOLOGY REPORT*  Clinical Data: Shortness of breath.  PORTABLE CHEST - 1 VIEW  Comparison: 08/06/2013.  Findings: Pulmonary vascular congestion.  Bibasilar subsegmental atelectasis greater on the right.  No segmental consolidation or gross pneumothorax.  Heart size top normal.  Slightly tortuous aorta.  IMPRESSION: Pulmonary vascular congestion.  Bibasilar subsegmental atelectasis greater on the right.  No segmental consolidation or gross pneumothorax.  Heart size top normal.  Slightly tortuous aorta.   Original Report Authenticated By: Lacy Duverney, M.D.   Dg Chest Port 1 View  08/06/2013   *RADIOLOGY REPORT*  Clinical Data: Cough and shortness of breath for 3 days.  PORTABLE CHEST - 1 VIEW  Comparison: 06/11/2013  Findings: Shallow inspiration. Mild cardiac enlargement and pulmonary vascular congestion.  No edema.  Infiltration or atelectasis in the right lung base.  Improving infiltration or atelectasis in the left lung base.  No pneumothorax.  No blunting of costophrenic angles.  IMPRESSION: Cardiac enlargement with mild pulmonary vascular congestion and infiltration or atelectasis in the right lung base are stable. Improving infiltrate/atelectasis in the left lung base.   Original Report Authenticated By: Burman Nieves, M.D.    Lab Results: Basic Metabolic Panel:  Recent Labs  04/54/09 0349  NA 137  K 4.4  CL 92*  CO2 44*  GLUCOSE 149*  BUN 11  CREATININE 0.36*  CALCIUM 8.6   Liver Function Tests: No results found for this  basename: AST, ALT, ALKPHOS, BILITOT, PROT, ALBUMIN,  in the last 72 hours   CBC: No results found for this basename: WBC, NEUTROABS, HGB, HCT, MCV, PLT,  in the last 72 hours  No results found for this or any previous visit (from the past 240 hour(s)).   Hospital Course: She was admitted with increased shortness of breath. She was started on treatment for pneumonia. She did improve. She continued to have trouble with symptoms of asthma and was started on steroids and had improved her situation significantly. She had some  trouble with constipation which was treated. She was significantly more deconditioned than previously and was felt that she was going to need rehabilitation. At the time of discharge she is much improved but still somewhat short of breath and weak  Discharge Exam: Blood pressure 114/73, pulse 82, temperature 98.1 F (36.7 C), temperature source Oral, resp. rate 20, height 5' (1.524 m), weight 102 kg (224 lb 13.9 oz), SpO2 94.00%. Her chest is clearer than before but still has prolonged expiration. Her heart is regular. Her abdomen is soft.  Disposition: To skilled care facility for rehabilitation. She will need PT OT and speech as needed      Discharge Orders   Future Appointments Provider Department Dept Phone   11/23/2013 11:30 AM [facility] [phone]   11/23/2013 12:00 PM [facility] [phone]   11/23/2013 12:30 PM [facility] Covering Provider [facility] [phone]   Future Orders Complete By Expires   Discharge to SNF when bed available  As directed         Signed: Chamia Schmutz L Pager [phone]  08/13/2013, 9:05 AM

## 2013-08-13 NOTE — Clinical Social Work Note (Signed)
Patient ready for discharge today, will go to [facility] via [facility].  Patient and sister notified and agreeable, [facility] admissions notified and agreeable.  Discharge summary faxed via [facility], FL2 reviewed w RN and updated, discharge packet prepared and placed w shadow chart for transport.  CSW signing off as no further SW needs identified.  Santa Genera, LCSW Clinical Social Worker [phone])

## 2013-09-19 DIAGNOSIS — J189 Pneumonia, unspecified organism: Secondary | ICD-10-CM

## 2013-09-19 HISTORY — DX: Pneumonia, unspecified organism: J18.9

## 2013-11-23 ENCOUNTER — Ambulatory Visit (HOSPITAL_COMMUNITY): Payer: Medicare Other

## 2013-11-23 ENCOUNTER — Ambulatory Visit (HOSPITAL_COMMUNITY): Payer: Medicare Other | Admitting: Oncology

## 2013-11-23 ENCOUNTER — Encounter (HOSPITAL_COMMUNITY): Payer: Self-pay

## 2013-11-23 ENCOUNTER — Encounter (HOSPITAL_COMMUNITY): Payer: Medicare Other | Attending: Hematology and Oncology

## 2013-11-23 ENCOUNTER — Other Ambulatory Visit (HOSPITAL_COMMUNITY): Payer: Medicare Other

## 2013-11-23 ENCOUNTER — Encounter (HOSPITAL_BASED_OUTPATIENT_CLINIC_OR_DEPARTMENT_OTHER): Payer: Medicare Other

## 2013-11-23 ENCOUNTER — Encounter (HOSPITAL_COMMUNITY): Payer: Medicare Other

## 2013-11-23 VITALS — BP 107/67 | HR 81 | Temp 98.8°F | Resp 20

## 2013-11-23 DIAGNOSIS — M810 Age-related osteoporosis without current pathological fracture: Secondary | ICD-10-CM

## 2013-11-23 DIAGNOSIS — C50919 Malignant neoplasm of unspecified site of unspecified female breast: Secondary | ICD-10-CM

## 2013-11-23 DIAGNOSIS — Z09 Encounter for follow-up examination after completed treatment for conditions other than malignant neoplasm: Secondary | ICD-10-CM | POA: Insufficient documentation

## 2013-11-23 DIAGNOSIS — Z853 Personal history of malignant neoplasm of breast: Secondary | ICD-10-CM | POA: Insufficient documentation

## 2013-11-23 LAB — CBC WITH DIFFERENTIAL/PLATELET
Basophils Relative: 0 % (ref 0–1)
HCT: 39.5 % (ref 36.0–46.0)
Hemoglobin: 11.7 g/dL — ABNORMAL LOW (ref 12.0–15.0)
MCH: 29.3 pg (ref 26.0–34.0)
MCHC: 29.6 g/dL — ABNORMAL LOW (ref 30.0–36.0)
Monocytes Absolute: 0.5 10*3/uL (ref 0.1–1.0)
Monocytes Relative: 7 % (ref 3–12)
Neutro Abs: 5.4 10*3/uL (ref 1.7–7.7)

## 2013-11-23 LAB — COMPREHENSIVE METABOLIC PANEL
ALT: 9 U/L (ref 0–35)
Albumin: 3.7 g/dL (ref 3.5–5.2)
Alkaline Phosphatase: 62 U/L (ref 39–117)
Potassium: 4.6 mEq/L (ref 3.5–5.1)
Sodium: 139 mEq/L (ref 135–145)
Total Protein: 6.8 g/dL (ref 6.0–8.3)

## 2013-11-23 MED ORDER — DENOSUMAB 60 MG/ML ~~LOC~~ SOLN
60.0000 mg | Freq: Once | SUBCUTANEOUS | Status: AC
  Administered 2013-11-23: 60 mg via SUBCUTANEOUS
  Filled 2013-11-23: qty 1

## 2013-11-23 MED ORDER — CARBAMIDE PEROXIDE 6.5 % OT SOLN: 5.0000 [drp] | Freq: Two times a day (BID) | OTIC | Status: DC

## 2013-11-23 MED ORDER — EXEMESTANE 25 MG PO TABS: 25.0000 mg | ORAL_TABLET | Freq: Every day | ORAL | Status: DC

## 2013-11-23 NOTE — Patient Instructions (Addendum)
[facility] Discharge Instructions  RECOMMENDATIONS MADE BY THE CONSULTANT AND ANY TEST RESULTS WILL BE SENT TO YOUR REFERRING PHYSICIAN.  Prolia injection today. We will continue lab work and Prolia injection every 6 months. MD appointment again in 6 months. You were 2 prescriptions today. Please give them to the nurse at your facility. Restart Exemestane 25 mg daily. Report any issues/concerns to clinic as needed prior to appointment.  Thank you for choosing [facility] to provide your oncology and hematology care.  To afford each patient quality time with our providers, please arrive at least 15 minutes before your scheduled appointment time.  With your help, our goal is to use those 15 minutes to complete the necessary work-up to ensure our physicians have the information they need to help with your evaluation and healthcare recommendations.    Effective January 1st, 2014, we ask that you re-schedule your appointment with our physicians should you arrive 10 or more minutes late for your appointment.  We strive to give you quality time with our providers, and arriving late affects you and other patients whose appointments are after yours.    Again, thank you for choosing [facility].  Our hope is that these requests will decrease the amount of time that you wait before being seen by our physicians.       _____________________________________________________________  Should you have questions after your visit to [facility], please contact our office at [phone] between the hours of 8:30 a.m. and 5:00 p.m.  Voicemails left after 4:30 p.m. will not be returned until the following business day.  For prescription refill requests, have your pharmacy contact our office with your prescription refill request.

## 2013-11-23 NOTE — Progress Notes (Signed)
[name] presents today for injection per MD orders. Prolia 60 mg administered SQ in left Abdomen. Administration without incident. Patient tolerated well.

## 2013-11-23 NOTE — Progress Notes (Signed)
Labs drawn today for cbc/diff,cmp,cea,[id number] 

## 2013-11-23 NOTE — Progress Notes (Signed)
[facility]  OFFICE PROGRESS NOTE  Fredirick Maudlin, MD [address] [place] [address]  DIAGNOSIS: Infiltrating ductal carcinoma of breast, unspecified laterality - Plan: CBC with Differential, CEA, Cancer antigen 27.29  Osteoporosis - Plan: denosumab (PROLIA) injection 60 mg, SCHEDULING COMMUNICATION  Chief Complaint  Patient presents with  . Breast Cancer    CURRENT THERAPY: Exemestane 25 mg daily.  INTERVAL HISTORY: [name] 72 y.o. female returns for followup of stage I right breast cancer currently taking exemestane 25 mg daily, status post lumpectomy but never having received radiotherapy because of difficulty with cerebral palsy and uncontrolled movements. She continues to be a resident at the nursing home. She stopped exemestane probably because of failure to reorder after 2 recent hospitalizations for pneumonia. Currently she has had problems with discomfort in her left ear. She is able to eat. She denies any episodes of lymphedema. She denies any diarrhea, constipation, dysuria, hematuria, incontinence, lower extremity swelling or redness, skin rash, worsening joint pain, vaginal dryness, hot flashes, headache, or seizures.  MEDICAL HISTORY: Past Medical History  Diagnosis Date  . Cerebral palsy   . Asthma   . Seasonal allergies   . Thyroid disease   . HTN (hypertension)   . Anxiety   . Arthritis   . Breast cancer     Right breast, infiltrating ductal.  . Anginal pain   . Osteoporosis 05/08/2012  . Osteoporosis 05/08/2012  . Stroke   . Angina at rest   . Chronic steroid use   . Pneumonia 09/2013    INTERIM HISTORY: has CARPAL TUNNEL SYNDROME, BILATERAL; COMPLETE RUPTURE OF ROTATOR CUFF; CLOSED FRACTURE OF DISTAL END OF ULNA; DEGENERATIVE JOINT DISEASE, RIGHT KNEE; BURSITIS, KNEE; Hemarthrosis involving knee joint; Sprain of left knee; Arthritis of knee, right; Infiltrating ductal carcinoma of  breast; Sciatica; DDD (degenerative disc disease), lumbar; Osteoporosis; Chest pain; Wheezes; Morbid obesity; Cerebral palsy; Thrombocytopenia; Asthma; Hypertension; Chronic steroid use; Leg edema, left; Chronic respiratory failure with hypoxia; Hypothyroidism; Asthma with acute exacerbation; Pneumonia; and Anxiety state, unspecified on her problem list.   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 Estrogen receptor was 100%, progesterone receptor was 90%, HER-2/neu was 0, and her Ki-67 marker was low at 9%, HER-2 was negative as mentioned. She is presently on Aromasin 25 mg once a day. She had her surgery on 03/20/2007 as far as the sentinel nodes were concerned. She was unable to take radiation due to her cerebral palsy and uncontrolled movements, so we will continue Aromasin probably for 5 to 10 years  ALLERGIES:  is allergic to naproxen.  MEDICATIONS: has a current medication list which includes the following prescription(s): acetaminophen, albuterol, albuterol, alprazolam, aspirin, cephalexin, diphenhydramine, levothyroxine, losartan-hydrochlorothiazide, magnesium hydroxide, nitroglycerin, ondansetron, pantoprazole, polyethylene glycol, potassium chloride sa, prednisone, sertraline, tramadol, zolpidem, and loperamide, and the following Facility-Administered Medications: denosumab.  SURGICAL HISTORY:  Past Surgical History  Procedure Laterality Date  . Breast lumpectomy    . Radical abdominal hysterectomy    . Dental extraction    . Right hand surgery      FAMILY HISTORY: family history includes Arthritis in an other family member; Asthma in an other family member; Cancer in an other family member; Diabetes in an other family member; Heart attack in her father and mother.  SOCIAL HISTORY:  reports that she has  never smoked. She has never used smokeless tobacco. She reports  that she does not drink alcohol or use illicit drugs.  REVIEW OF SYSTEMS:  Other than that discussed above is noncontributory.  PHYSICAL EXAMINATION: ECOG PERFORMANCE STATUS: 3 - Symptomatic, >50% confined to bed  Blood pressure 107/67, pulse 81, temperature 98.8 F (37.1 C), temperature source Oral, resp. rate 20, weight 0 lb (0 kg).  GENERAL:alert, no distress and comfortable. Morbidly obese. SKIN: skin color, texture, turgor are normal, no rashes or significant lesions EARS: Left ear with hearing in place. Significant cerumen noted behind the hearing aid. EYES: PERLA; Conjunctiva are pink and non-injected, sclera clear OROPHARYNX:no exudate, no erythema on lips, buccal mucosa, or tongue. NECK: supple, thyroid normal size, non-tender, without nodularity. No masses CHEST: Status post right breast lumpectomy with no masses in either breast. LYMPH:  no palpable lymphadenopathy in the cervical, axillary or inguinal LUNGS: clear to auscultation and percussion with normal breathing effort HEART: regular rate & rhythm and no murmurs. ABDOMEN:abdomen soft, non-tender and normal bowel sounds MUSCULOSKELETAL:no cyanosis of digits and no clubbing. Range of motion normal.  NEURO: alert & oriented x 3 with fluent speech, no focal motor/sensory deficits uncontrolled choreoform movements of all extremities and neck.   LABORATORY DATA: Infusion on 11/23/2013  Component Date Value Range Status  . Sodium 11/23/2013 139  135 - 145 mEq/L Final  . Potassium 11/23/2013 4.6  3.5 - 5.1 mEq/L Final  . Chloride 11/23/2013 91* 96 - 112 mEq/L Final  . CO2 11/23/2013 44* 19 - 32 mEq/L Final   Comment: CRITICAL RESULT CALLED TO, READ BACK BY AND VERIFIED WITH:                          B. OLEARY AT 1315 ON 11/23/13 BY S. VANHOORNE  . Glucose, Bld 11/23/2013 95  70 - 99 mg/dL Final  . BUN 96/03/5408 15  6 - 23 mg/dL Final  . Creatinine, Ser 11/23/2013 0.47* 0.50 - 1.10 mg/dL Final  . Calcium 81/19/1478 9.5   8.4 - 10.5 mg/dL Final  . Total Protein 11/23/2013 6.8  6.0 - 8.3 g/dL Final  . Albumin 29/56/2130 3.7  3.5 - 5.2 g/dL Final  . AST 86/57/8469 12  0 - 37 U/L Final  . ALT 11/23/2013 9  0 - 35 U/L Final  . Alkaline Phosphatase 11/23/2013 62  39 - 117 U/L Final  . Total Bilirubin 11/23/2013 0.3  0.3 - 1.2 mg/dL Final  . GFR calc non Af Amer 11/23/2013 >90  >90 mL/min Final  . GFR calc Af Amer 11/23/2013 >90  >90 mL/min Final   Comment: (NOTE)                          The eGFR has been calculated using the CKD EPI equation.                          This calculation has not been validated in all clinical situations.                          eGFR's persistently <90 mL/min signify possible Chronic Kidney                          Disease.  . WBC 11/23/2013 6.7  4.0 - 10.5 K/uL Final  .  RBC 11/23/2013 3.99  3.87 - 5.11 MIL/uL Final  . Hemoglobin 11/23/2013 11.7* 12.0 - 15.0 g/dL Final  . HCT 47/82/9562 39.5  36.0 - 46.0 % Final  . MCV 11/23/2013 99.0  78.0 - 100.0 fL Final  . MCH 11/23/2013 29.3  26.0 - 34.0 pg Final  . MCHC 11/23/2013 29.6* 30.0 - 36.0 g/dL Final  . RDW 13/07/6577 14.5  11.5 - 15.5 % Final  . Platelets 11/23/2013 150  150 - 400 K/uL Final  . Neutrophils Relative % 11/23/2013 81* 43 - 77 % Final  . Neutro Abs 11/23/2013 5.4  1.7 - 7.7 K/uL Final  . Lymphocytes Relative 11/23/2013 11* 12 - 46 % Final  . Lymphs Abs 11/23/2013 0.7  0.7 - 4.0 K/uL Final  . Monocytes Relative 11/23/2013 7  3 - 12 % Final  . Monocytes Absolute 11/23/2013 0.5  0.1 - 1.0 K/uL Final  . Eosinophils Relative 11/23/2013 1  0 - 5 % Final  . Eosinophils Absolute 11/23/2013 0.1  0.0 - 0.7 K/uL Final  . Basophils Relative 11/23/2013 0  0 - 1 % Final  . Basophils Absolute 11/23/2013 0.0  0.0 - 0.1 K/uL Final    PATHOLOGY: No new pathology.  Urinalysis    Component Value Date/Time   COLORURINE YELLOW 06/28/2012 1551   APPEARANCEUR HAZY* 06/28/2012 1551   LABSPEC >1.030* 06/28/2012 1551   PHURINE 5.5  06/28/2012 1551   GLUCOSEU NEGATIVE 06/28/2012 1551   HGBUR LARGE* 06/28/2012 1551   BILIRUBINUR NEGATIVE 06/28/2012 1551   KETONESUR NEGATIVE 06/28/2012 1551   PROTEINUR 30* 06/28/2012 1551   UROBILINOGEN 0.2 06/28/2012 1551   NITRITE NEGATIVE 06/28/2012 1551   LEUKOCYTESUR MODERATE* 06/28/2012 1551    RADIOGRAPHIC STUDIES: No results found.  ASSESSMENT:  #1. Stage I breast cancer, no evidence of disease. #2. Cerebral palsy with choreoform movements. #3. Left ear cerumen   PLAN:  #1. Debrox drops of the left ear after warming. #2. Exemestane 25 mg daily. #3. Prolia 60 mg subcutaneously today to be repeated in 6 months. #4. Followup in 6 months with Prolia, lab tests, and physical exam. We'll not order any further mammograms.   All questions were answered. The patient knows to call the clinic with any problems, questions or concerns. We can certainly see the patient much sooner if necessary.   I spent 25 minutes counseling the patient face to face. The total time spent in the appointment was 30 minutes.    Maurilio Lovely, MD 11/23/2013 1:24 PM

## 2013-11-24 LAB — CEA: CEA: 1.6 ng/mL (ref 0.0–5.0)

## 2013-12-12 ENCOUNTER — Telehealth (HOSPITAL_COMMUNITY): Payer: Self-pay | Admitting: *Deleted

## 2013-12-12 NOTE — Telephone Encounter (Signed)
Patient complaining of sweating since starting Exemestane. Dr. Zigmund Daniel instructed me to let nurse @ [facility] know to Hold the Exemestane x 7 days to see if this is Exemestane induced or just from a hot climate in her shared quarters with her roommate or a combination. Nurse to contact me in 1 week to let me know how she is doing with the sweating.

## 2013-12-19 ENCOUNTER — Telehealth (HOSPITAL_COMMUNITY): Payer: Self-pay | Admitting: *Deleted

## 2013-12-19 NOTE — Telephone Encounter (Signed)
Spoke with patient's nurse @ [facility] and she stated that patient had a significant reduction in sweating since holding her Aromasin x 1 week. She told the nurse @ [facility] that she was aware of what could happen by not taking the medication and that she was fine with that but did not want to continue taking the Aromasin. Nurse @ [facility] was going to discontinue it from her medications since pt did not want to take it anymore.

## 2013-12-31 ENCOUNTER — Inpatient Hospital Stay (HOSPITAL_COMMUNITY)
Admission: EM | Admit: 2013-12-31 | Discharge: 2014-01-04 | DRG: 871 | Disposition: A | Discharge status: Skilled Nursing Facility | Payer: Medicare Other | Attending: Pulmonary Disease | Admitting: Pulmonary Disease

## 2013-12-31 ENCOUNTER — Emergency Department (HOSPITAL_COMMUNITY): Payer: Medicare Other

## 2013-12-31 ENCOUNTER — Encounter (HOSPITAL_COMMUNITY): Payer: Self-pay | Admitting: Emergency Medicine

## 2013-12-31 DIAGNOSIS — M810 Age-related osteoporosis without current pathological fracture: Secondary | ICD-10-CM | POA: Diagnosis present

## 2013-12-31 DIAGNOSIS — J9620 Acute and chronic respiratory failure, unspecified whether with hypoxia or hypercapnia: Secondary | ICD-10-CM | POA: Diagnosis present

## 2013-12-31 DIAGNOSIS — I10 Essential (primary) hypertension: Secondary | ICD-10-CM

## 2013-12-31 DIAGNOSIS — Z79899 Other long term (current) drug therapy: Secondary | ICD-10-CM

## 2013-12-31 DIAGNOSIS — J45909 Unspecified asthma, uncomplicated: Secondary | ICD-10-CM

## 2013-12-31 DIAGNOSIS — Z825 Family history of asthma and other chronic lower respiratory diseases: Secondary | ICD-10-CM

## 2013-12-31 DIAGNOSIS — I959 Hypotension, unspecified: Secondary | ICD-10-CM

## 2013-12-31 DIAGNOSIS — J9611 Chronic respiratory failure with hypoxia: Secondary | ICD-10-CM

## 2013-12-31 DIAGNOSIS — M129 Arthropathy, unspecified: Secondary | ICD-10-CM | POA: Diagnosis present

## 2013-12-31 DIAGNOSIS — I214 Non-ST elevation (NSTEMI) myocardial infarction: Secondary | ICD-10-CM

## 2013-12-31 DIAGNOSIS — J441 Chronic obstructive pulmonary disease with (acute) exacerbation: Secondary | ICD-10-CM

## 2013-12-31 DIAGNOSIS — J984 Other disorders of lung: Secondary | ICD-10-CM

## 2013-12-31 DIAGNOSIS — J189 Pneumonia, unspecified organism: Secondary | ICD-10-CM

## 2013-12-31 DIAGNOSIS — R0603 Acute respiratory distress: Secondary | ICD-10-CM

## 2013-12-31 DIAGNOSIS — J9600 Acute respiratory failure, unspecified whether with hypoxia or hypercapnia: Secondary | ICD-10-CM

## 2013-12-31 DIAGNOSIS — Z8673 Personal history of transient ischemic attack (TIA), and cerebral infarction without residual deficits: Secondary | ICD-10-CM

## 2013-12-31 DIAGNOSIS — R079 Chest pain, unspecified: Secondary | ICD-10-CM

## 2013-12-31 DIAGNOSIS — A419 Sepsis, unspecified organism: Principal | ICD-10-CM

## 2013-12-31 DIAGNOSIS — Z853 Personal history of malignant neoplasm of breast: Secondary | ICD-10-CM

## 2013-12-31 DIAGNOSIS — K219 Gastro-esophageal reflux disease without esophagitis: Secondary | ICD-10-CM | POA: Diagnosis present

## 2013-12-31 DIAGNOSIS — J101 Influenza due to other identified influenza virus with other respiratory manifestations: Secondary | ICD-10-CM

## 2013-12-31 DIAGNOSIS — IMO0002 Reserved for concepts with insufficient information to code with codable children: Secondary | ICD-10-CM

## 2013-12-31 DIAGNOSIS — Z8249 Family history of ischemic heart disease and other diseases of the circulatory system: Secondary | ICD-10-CM

## 2013-12-31 DIAGNOSIS — G809 Cerebral palsy, unspecified: Secondary | ICD-10-CM

## 2013-12-31 DIAGNOSIS — E039 Hypothyroidism, unspecified: Secondary | ICD-10-CM

## 2013-12-31 DIAGNOSIS — Z66 Do not resuscitate: Secondary | ICD-10-CM | POA: Diagnosis present

## 2013-12-31 DIAGNOSIS — Z833 Family history of diabetes mellitus: Secondary | ICD-10-CM

## 2013-12-31 DIAGNOSIS — J1100 Influenza due to unidentified influenza virus with unspecified type of pneumonia: Secondary | ICD-10-CM | POA: Diagnosis present

## 2013-12-31 DIAGNOSIS — Z6841 Body Mass Index (BMI) 40.0 and over, adult: Secondary | ICD-10-CM

## 2013-12-31 DIAGNOSIS — R062 Wheezing: Secondary | ICD-10-CM

## 2013-12-31 DIAGNOSIS — F411 Generalized anxiety disorder: Secondary | ICD-10-CM | POA: Diagnosis present

## 2013-12-31 DIAGNOSIS — R6520 Severe sepsis without septic shock: Secondary | ICD-10-CM

## 2013-12-31 DIAGNOSIS — J45901 Unspecified asthma with (acute) exacerbation: Secondary | ICD-10-CM

## 2013-12-31 HISTORY — DX: Hypokalemia: E87.6

## 2013-12-31 HISTORY — DX: Dysphagia, unspecified: R13.10

## 2013-12-31 LAB — BLOOD GAS, ARTERIAL
Acid-Base Excess: 10.6 mmol/L — ABNORMAL HIGH (ref 0.0–2.0)
Bicarbonate: 36.8 mEq/L — ABNORMAL HIGH (ref 20.0–24.0)
Drawn by: 22223
O2 Content: 4 L/min
O2 SAT: 96.3 %
PATIENT TEMPERATURE: 37
TCO2: 33.9 mmol/L (ref 0–100)
pCO2 arterial: 73.1 mmHg (ref 35.0–45.0)
pH, Arterial: 7.323 — ABNORMAL LOW (ref 7.350–7.450)
pO2, Arterial: 90.5 mmHg (ref 80.0–100.0)

## 2013-12-31 LAB — CBC WITH DIFFERENTIAL/PLATELET
BASOS PCT: 0 % (ref 0–1)
Basophils Absolute: 0 10*3/uL (ref 0.0–0.1)
Eosinophils Absolute: 0 10*3/uL (ref 0.0–0.7)
Eosinophils Relative: 1 % (ref 0–5)
HCT: 36.9 % (ref 36.0–46.0)
HEMOGLOBIN: 12.4 g/dL (ref 12.0–15.0)
Lymphocytes Relative: 8 % — ABNORMAL LOW (ref 12–46)
Lymphs Abs: 0.6 10*3/uL — ABNORMAL LOW (ref 0.7–4.0)
MCH: 32.5 pg (ref 26.0–34.0)
MCHC: 33.6 g/dL (ref 30.0–36.0)
MCV: 96.6 fL (ref 78.0–100.0)
MONOS PCT: 7 % (ref 3–12)
Monocytes Absolute: 0.4 10*3/uL (ref 0.1–1.0)
NEUTROS PCT: 85 % — AB (ref 43–77)
Neutro Abs: 5.5 10*3/uL (ref 1.7–7.7)
Platelets: 126 10*3/uL — ABNORMAL LOW (ref 150–400)
RBC: 3.82 MIL/uL — ABNORMAL LOW (ref 3.87–5.11)
RDW: 14.9 % (ref 11.5–15.5)
WBC: 6.5 10*3/uL (ref 4.0–10.5)

## 2013-12-31 LAB — TROPONIN I
TROPONIN I: 1.49 ng/mL — AB (ref <0.30)
Troponin I: 1.91 ng/mL (ref <0.30)
Troponin I: 1.49 ng/mL (ref <0.30)
Troponin I: 1.29 ng/mL (ref <0.30)

## 2013-12-31 LAB — INFLUENZA PANEL BY PCR (TYPE A & B)
H1N1FLUPCR: NOT DETECTED
INFLAPCR: POSITIVE — AB
INFLBPCR: NEGATIVE

## 2013-12-31 LAB — BASIC METABOLIC PANEL
BUN: 18 mg/dL (ref 6–23)
CALCIUM: 9.1 mg/dL (ref 8.4–10.5)
CO2: 38 mEq/L — ABNORMAL HIGH (ref 19–32)
Chloride: 97 mEq/L (ref 96–112)
Creatinine, Ser: 0.44 mg/dL — ABNORMAL LOW (ref 0.50–1.10)
GFR calc Af Amer: >90 mL/min (ref >90)
GLUCOSE: 150 mg/dL — AB (ref 70–99)
POTASSIUM: 3.8 meq/L (ref 3.7–5.3)
Sodium: 146 mEq/L (ref 137–147)

## 2013-12-31 LAB — APTT: aPTT: 28 seconds (ref 24–37)

## 2013-12-31 LAB — PRO B NATRIURETIC PEPTIDE: Pro B Natriuretic peptide (BNP): 408.7 pg/mL — ABNORMAL HIGH (ref 0–125)

## 2013-12-31 LAB — MRSA PCR SCREENING: MRSA by PCR: NEGATIVE

## 2013-12-31 LAB — HEPARIN LEVEL (UNFRACTIONATED): Heparin Unfractionated: 0.52 IU/mL (ref 0.30–0.70)

## 2013-12-31 LAB — PROTIME-INR
INR: 1.02 (ref 0.00–1.49)
PROTHROMBIN TIME: 13.2 s (ref 11.6–15.2)

## 2013-12-31 MED ORDER — SERTRALINE HCL 50 MG PO TABS
100.0000 mg | ORAL_TABLET | Freq: Every day | ORAL | Status: DC
  Administered 2013-12-31 – 2014-01-04 (×5): 100 mg via ORAL
  Filled 2013-12-31 (×7): qty 2

## 2013-12-31 MED ORDER — DEXTROSE 5 % IV SOLN
1.0000 g | Freq: Three times a day (TID) | INTRAVENOUS | Status: DC
  Administered 2013-12-31 – 2014-01-04 (×12): 1 g via INTRAVENOUS
  Filled 2013-12-31 (×21): qty 1

## 2013-12-31 MED ORDER — SODIUM CHLORIDE 0.9 % IJ SOLN
3.0000 mL | Freq: Two times a day (BID) | INTRAMUSCULAR | Status: DC
  Administered 2013-12-31 – 2014-01-03 (×3): 3 mL via INTRAVENOUS

## 2013-12-31 MED ORDER — DIPHENHYDRAMINE HCL 25 MG PO CAPS
25.0000 mg | ORAL_CAPSULE | Freq: Every day | ORAL | Status: DC
  Administered 2013-12-31 – 2014-01-03 (×4): 25 mg via ORAL
  Filled 2013-12-31 (×4): qty 1

## 2013-12-31 MED ORDER — HEPARIN (PORCINE) IN NACL 100-0.45 UNIT/ML-% IJ SOLN
1250.0000 [IU]/h | INTRAMUSCULAR | Status: DC
  Administered 2013-12-31 – 2014-01-01 (×2): 1250 [IU]/h via INTRAVENOUS
  Filled 2013-12-31 (×2): qty 250

## 2013-12-31 MED ORDER — POTASSIUM CHLORIDE CRYS ER 20 MEQ PO TBCR
20.0000 meq | EXTENDED_RELEASE_TABLET | Freq: Two times a day (BID) | ORAL | Status: DC
  Administered 2013-12-31 – 2014-01-04 (×9): 20 meq via ORAL
  Filled 2013-12-31 (×9): qty 1

## 2013-12-31 MED ORDER — SODIUM CHLORIDE 0.9 % IV BOLUS (SEPSIS)
500.0000 mL | Freq: Once | INTRAVENOUS | Status: AC
  Administered 2013-12-31: 500 mL via INTRAVENOUS

## 2013-12-31 MED ORDER — VANCOMYCIN HCL IN DEXTROSE 1-5 GM/200ML-% IV SOLN
1000.0000 mg | Freq: Two times a day (BID) | INTRAVENOUS | Status: DC
  Administered 2013-12-31 – 2014-01-04 (×8): 1000 mg via INTRAVENOUS
  Filled 2013-12-31 (×14): qty 200

## 2013-12-31 MED ORDER — NITROGLYCERIN 0.4 MG SL SUBL: 0.4000 mg | SUBLINGUAL_TABLET | SUBLINGUAL | Status: DC | PRN

## 2013-12-31 MED ORDER — VANCOMYCIN HCL IN DEXTROSE 1-5 GM/200ML-% IV SOLN
1000.0000 mg | Freq: Once | INTRAVENOUS | Status: AC
  Administered 2013-12-31: 1000 mg via INTRAVENOUS
  Filled 2013-12-31: qty 200

## 2013-12-31 MED ORDER — DEXTROSE 5 % IV SOLN
INTRAVENOUS | Status: AC
  Filled 2013-12-31 (×2): qty 1

## 2013-12-31 MED ORDER — OSELTAMIVIR PHOSPHATE 75 MG PO CAPS
75.0000 mg | ORAL_CAPSULE | Freq: Two times a day (BID) | ORAL | Status: DC
  Administered 2013-12-31 – 2014-01-04 (×8): 75 mg via ORAL
  Filled 2013-12-31 (×8): qty 1

## 2013-12-31 MED ORDER — LEVOTHYROXINE SODIUM 88 MCG PO TABS
88.0000 ug | ORAL_TABLET | Freq: Every day | ORAL | Status: DC
  Administered 2013-12-31 – 2014-01-04 (×5): 88 ug via ORAL
  Filled 2013-12-31 (×11): qty 1

## 2013-12-31 MED ORDER — IPRATROPIUM BROMIDE 0.02 % IN SOLN
RESPIRATORY_TRACT | Status: AC
  Administered 2013-12-31: 03:00:00
  Filled 2013-12-31: qty 2.5

## 2013-12-31 MED ORDER — HEPARIN (PORCINE) IN NACL 100-0.45 UNIT/ML-% IJ SOLN
12.0000 [IU]/kg/h | INTRAMUSCULAR | Status: DC
  Administered 2013-12-31: 12 [IU]/kg/h via INTRAVENOUS
  Filled 2013-12-31: qty 250

## 2013-12-31 MED ORDER — PANTOPRAZOLE SODIUM 40 MG PO TBEC
40.0000 mg | DELAYED_RELEASE_TABLET | Freq: Every day | ORAL | Status: DC
  Administered 2013-12-31 – 2014-01-04 (×5): 40 mg via ORAL
  Filled 2013-12-31 (×5): qty 1

## 2013-12-31 MED ORDER — ACETAMINOPHEN 325 MG PO TABS: 650.0000 mg | ORAL_TABLET | Freq: Four times a day (QID) | ORAL | Status: DC | PRN

## 2013-12-31 MED ORDER — METHYLPREDNISOLONE SODIUM SUCC 125 MG IJ SOLR
125.0000 mg | Freq: Once | INTRAMUSCULAR | Status: AC
  Administered 2013-12-31: 125 mg via INTRAVENOUS
  Filled 2013-12-31: qty 2

## 2013-12-31 MED ORDER — ALPRAZOLAM 1 MG PO TABS
1.0000 mg | ORAL_TABLET | ORAL | Status: DC | PRN
  Administered 2013-12-31 – 2014-01-04 (×10): 1 mg via ORAL
  Filled 2013-12-31 (×2): qty 1
  Filled 2013-12-31 (×2): qty 2
  Filled 2013-12-31: qty 1
  Filled 2013-12-31 (×3): qty 2
  Filled 2013-12-31: qty 1
  Filled 2013-12-31: qty 2

## 2013-12-31 MED ORDER — ALBUTEROL (5 MG/ML) CONTINUOUS INHALATION SOLN: 10.0000 mg | INHALATION_SOLUTION | RESPIRATORY_TRACT | Status: AC

## 2013-12-31 MED ORDER — CARBAMIDE PEROXIDE 6.5 % OT SOLN
5.0000 [drp] | Freq: Two times a day (BID) | OTIC | Status: DC
  Administered 2013-12-31 – 2014-01-03 (×7): 5 [drp] via OTIC
  Filled 2013-12-31 (×2): qty 15

## 2013-12-31 MED ORDER — ASPIRIN 325 MG PO TABS
325.0000 mg | ORAL_TABLET | Freq: Every day | ORAL | Status: DC
  Administered 2013-12-31 – 2014-01-04 (×5): 325 mg via ORAL
  Filled 2013-12-31 (×5): qty 1

## 2013-12-31 MED ORDER — LORATADINE 10 MG PO TABS
10.0000 mg | ORAL_TABLET | Freq: Every day | ORAL | Status: DC
Start: 2013-12-31 — End: 2014-01-04
  Administered 2013-12-31 – 2014-01-04 (×5): 10 mg via ORAL
  Filled 2013-12-31 (×5): qty 1

## 2013-12-31 MED ORDER — EXEMESTANE 25 MG PO TABS
25.0000 mg | ORAL_TABLET | Freq: Every day | ORAL | Status: DC
  Administered 2013-12-31: 25 mg via ORAL
  Filled 2013-12-31 (×2): qty 1

## 2013-12-31 MED ORDER — VANCOMYCIN HCL IN DEXTROSE 1-5 GM/200ML-% IV SOLN
INTRAVENOUS | Status: AC
  Filled 2013-12-31: qty 200

## 2013-12-31 MED ORDER — ONDANSETRON HCL 4 MG/2ML IJ SOLN: 4.0000 mg | Freq: Four times a day (QID) | INTRAMUSCULAR | Status: DC | PRN

## 2013-12-31 MED ORDER — METHYLPREDNISOLONE SODIUM SUCC 125 MG IJ SOLR
60.0000 mg | Freq: Four times a day (QID) | INTRAMUSCULAR | Status: DC
  Administered 2013-12-31: 60 mg via INTRAVENOUS
  Administered 2013-12-31: 09:00:00 via INTRAVENOUS
  Filled 2013-12-31 (×2): qty 2

## 2013-12-31 MED ORDER — ALBUTEROL SULFATE (2.5 MG/3ML) 0.083% IN NEBU
INHALATION_SOLUTION | RESPIRATORY_TRACT | Status: AC
  Administered 2013-12-31: 10 mg
  Filled 2013-12-31: qty 6

## 2013-12-31 MED ORDER — ATORVASTATIN CALCIUM 40 MG PO TABS
80.0000 mg | ORAL_TABLET | Freq: Every day | ORAL | Status: DC
  Administered 2014-01-01 – 2014-01-03 (×3): 80 mg via ORAL
  Filled 2013-12-31 (×7): qty 2

## 2013-12-31 MED ORDER — ALBUTEROL SULFATE (2.5 MG/3ML) 0.083% IN NEBU
2.5000 mg | INHALATION_SOLUTION | RESPIRATORY_TRACT | Status: DC | PRN
  Administered 2014-01-01 (×2): 2.5 mg via RESPIRATORY_TRACT
  Filled 2013-12-31 (×3): qty 3

## 2013-12-31 MED ORDER — OSELTAMIVIR PHOSPHATE 75 MG PO CAPS
75.0000 mg | ORAL_CAPSULE | Freq: Once | ORAL | Status: AC
  Administered 2013-12-31: 75 mg via ORAL
  Filled 2013-12-31: qty 1

## 2013-12-31 MED ORDER — SODIUM CHLORIDE 0.9 % IV SOLN
INTRAVENOUS | Status: DC
  Administered 2013-12-31: 1000 mL via INTRAVENOUS
  Administered 2014-01-01 – 2014-01-03 (×4): via INTRAVENOUS

## 2013-12-31 MED ORDER — ACETAMINOPHEN 650 MG RE SUPP: 650.0000 mg | Freq: Four times a day (QID) | RECTAL | Status: DC | PRN

## 2013-12-31 MED ORDER — SACCHAROMYCES BOULARDII 250 MG PO CAPS
250.0000 mg | ORAL_CAPSULE | Freq: Two times a day (BID) | ORAL | Status: DC
  Administered 2013-12-31 – 2014-01-04 (×9): 250 mg via ORAL
  Filled 2013-12-31 (×13): qty 1

## 2013-12-31 MED ORDER — PIPERACILLIN-TAZOBACTAM 3.375 G IVPB 30 MIN
3.3750 g | Freq: Once | INTRAVENOUS | Status: AC
  Administered 2013-12-31: 3.375 g via INTRAVENOUS
  Filled 2013-12-31 (×2): qty 50

## 2013-12-31 MED ORDER — HEPARIN BOLUS VIA INFUSION
4000.0000 [IU] | Freq: Once | INTRAVENOUS | Status: AC
  Administered 2013-12-31: 4000 [IU] via INTRAVENOUS

## 2013-12-31 MED ORDER — METHYLPREDNISOLONE SODIUM SUCC 125 MG IJ SOLR
60.0000 mg | Freq: Four times a day (QID) | INTRAMUSCULAR | Status: DC
  Administered 2013-12-31 – 2014-01-04 (×15): 60 mg via INTRAVENOUS
  Filled 2013-12-31 (×15): qty 2

## 2013-12-31 MED ORDER — ZOLPIDEM TARTRATE 5 MG PO TABS
5.0000 mg | ORAL_TABLET | Freq: Every day | ORAL | Status: DC
  Administered 2013-12-31 – 2014-01-03 (×4): 5 mg via ORAL
  Filled 2013-12-31 (×4): qty 1

## 2013-12-31 MED ORDER — BACLOFEN 10 MG PO TABS
5.0000 mg | ORAL_TABLET | Freq: Three times a day (TID) | ORAL | Status: DC
  Administered 2013-12-31 – 2014-01-04 (×12): 5 mg via ORAL
  Filled 2013-12-31 (×2): qty 1
  Filled 2013-12-31 (×5): qty 0.5
  Filled 2013-12-31: qty 1
  Filled 2013-12-31 (×3): qty 0.5
  Filled 2013-12-31: qty 1
  Filled 2013-12-31 (×11): qty 0.5

## 2013-12-31 NOTE — ED Notes (Signed)
hospitalist paged to advised of bp. No other changes.

## 2013-12-31 NOTE — ED Notes (Signed)
Patient from [place], complaining of shortness of breath. Per EMS, O2 sats 75% on 2L via nasal canula. Patient given 1 nitro and 324 mg chewable aspirin prior to arrival.

## 2013-12-31 NOTE — Progress Notes (Signed)
ANTICOAGULATION CONSULT NOTE - Initial Consult  Pharmacy Consult for Heparin Indication: chest pain/ACS  Allergies  Allergen Reactions  . Naproxen Other (See Comments)    unknown    Patient Measurements: Height: 5\' 2"  (157.5 cm) Weight: 225 lb (102.059 kg) IBW/kg (Calculated) : 50.1 Heparin Dosing Weight: 74.4 kg  Vital Signs: Temp: 98.5 F (36.9 C) (01/12 1000) Temp src: Oral (01/12 1000) BP: 101/34 mmHg (01/12 1420) Pulse Rate: 89 (01/12 1200)  Labs:  Recent Labs  12/31/13 0322 12/31/13 0830 12/31/13 1434  HGB 12.4  --   --   HCT 36.9  --   --   PLT 126*  --   --   APTT 28  --   --   LABPROT 13.2  --   --   INR 1.02  --   --   HEPARINUNFRC  --   --  0.52  CREATININE 0.44*  --   --   TROPONINI 1.91* 1.49* 1.49*    Estimated Creatinine Clearance: 71.1 ml/min (by C-G formula based on Cr of 0.44).   Medical History: Past Medical History  Diagnosis Date  . Cerebral palsy   . Asthma   . Seasonal allergies   . Thyroid disease   . HTN (hypertension)   . Anxiety   . Arthritis   . Breast cancer     Right breast, infiltrating ductal.  . Anginal pain   . Osteoporosis 05/08/2012  . Osteoporosis 05/08/2012  . Stroke   . Angina at rest   . Chronic steroid use   . Pneumonia 09/2013  . Dysphagia   . Hypopotassemia     Medications:  Scheduled:  . aspirin  325 mg Oral Daily  . atorvastatin  80 mg Oral q1800  . baclofen  5 mg Oral TID  . carbamide peroxide  5 drop Both Ears BID  . levothyroxine  88 mcg Oral QAC breakfast  . loratadine  10 mg Oral Daily  . methylPREDNISolone (SOLU-MEDROL) injection  60 mg Intravenous Q6H  . oseltamivir  75 mg Oral BID  . pantoprazole  40 mg Oral Daily  . potassium chloride SA  20 mEq Oral BID  . saccharomyces boulardii  250 mg Oral BID  . sertraline  100 mg Oral Daily    Assessment: Heparin for NSTEMI Heparin started by ED MD 1250 units/hr Pharmacy protocol on admission Heparin likely to be discontinued tomorrow per  cardiology consult note Heparin level at goal  Goal of Therapy:  Heparin level 0.3-0.7 units/ml Monitor platelets by anticoagulation protocol: Yes   Plan:  Continue Heparin at 1250 units/hr Heparin level daily starting in AM Monitor platelets, CBC Labs per protocol   Abner Greenspan, Don Tiu Bennett 12/31/2013,3:57 PM

## 2013-12-31 NOTE — ED Notes (Signed)
CRITICAL VALUE ALERT  Critical value received:  Troponin - 1.91  Date of notification:  12/31/2013  Time of notification:  0438  Critical value read back: yes  Nurse who received alert:  [id number]  MD notified (1st page):  Dr Sabra Heck  Time of first page:  [phone]  MD notified (2nd page):  Time of second page:  Responding MD:  Dr Sabra Heck  Time MD responded:  [phone]

## 2013-12-31 NOTE — ED Notes (Signed)
Patient placed on non-rebreather 15L O2, O2 saturation increased to 99%.

## 2013-12-31 NOTE — Consult Note (Signed)
CARDIOLOGY CONSULT NOTE  Patient ID: [name] MRN: [id number] DOB/AGE: [birth year] 73 y.o.  Admit date: 12/31/2013 Primary Physician HAWKINS,EDWARD L, MD  Reason for Consultation: NSTEMI  HPI: The patient is a 73 yr old woman with a PMH significant for cerebral palsy (functional with ADLs), COPD, prior CVA, hypertension, anxiety and depression, right breast cancer s/p lumpectomy, hypothyroidism, and GERD. She has a h/o hospitalization for chest pain in 05/2013 which was deemed noncardiac in etiology, as she had a normal nuclear myocardial perfusion study and an echocardiogram which revealed normal LV systolic function, EF 86-76%, with grade I diastolic dysfunction. She presented with substernal chest pain which responded to nitroglycerin. She was found to be in hypoxic respiratory failure with SOB, productive cough, fever and chills. She did respond to bronchodilators.  A chest xray showed right-sided middle and lower lobe opacities suspicious for atelectasis vs pneumonia. PCR is positive for influenza A. She has subsequently been placed on ASA and heparin. Unable to use beta blockers due to severe COPD. Antihypertensives also held due to hypotension. During my interview, she described her previous symptoms as "chest tightness" but she is no longer experiencing this. She said she had some mild feet swelling over the past few days. Her son was present in the ED room.  ECG shows sinus tachycardia with a nonspecific T wave abnormality, with a mild degree of T wave inversion inferiorly. Troponins: 1.91-->1.49. BNP 408.    Allergies  Allergen Reactions  . Naproxen Other (See Comments)    unknown    Current Facility-Administered Medications  Medication Dose Route Frequency Provider Last Rate Last Dose  . albuterol (PROVENTIL) (2.5 MG/3ML) 0.083% nebulizer solution 2.5 mg  2.5 mg Nebulization Q2H PRN Kinnie Feil, MD      . ALPRAZolam [name]) tablet 1 mg  1 mg Oral Q4H PRN  Kinnie Feil, MD   1 mg at 12/31/13 0852  . aspirin tablet 325 mg  325 mg Oral Daily Kinnie Feil, MD   325 mg at 12/31/13 1001  . baclofen (LIORESAL) tablet 5 mg  5 mg Oral TID Kinnie Feil, MD   5 mg at 12/31/13 1000  . carbamide peroxide (DEBROX) 6.5 % otic solution 5 drop  5 drop Both Ears BID Kinnie Feil, MD   5 drop at 12/31/13 1002  . heparin ADULT infusion 100 units/mL (25000 units/250 mL)  12 Units/kg/hr Intravenous Continuous Johnna Acosta, MD 12.5 mL/hr at 12/31/13 0509 12 Units/kg/hr at 12/31/13 0509  . levothyroxine (SYNTHROID, LEVOTHROID) tablet 88 mcg  88 mcg Oral QAC breakfast Kinnie Feil, MD   88 mcg at 12/31/13 0839  . loratadine (CLARITIN) tablet 10 mg  10 mg Oral Daily Kinnie Feil, MD   10 mg at 12/31/13 1002  . methylPREDNISolone sodium succinate (SOLU-MEDROL) 125 mg/2 mL injection 60 mg  60 mg Intravenous Q6H Kinnie Feil, MD      . pantoprazole (PROTONIX) EC tablet 40 mg  40 mg Oral Daily Kinnie Feil, MD   40 mg at 12/31/13 1001  . potassium chloride SA (K-DUR,KLOR-CON) CR tablet 20 mEq  20 mEq Oral BID Kinnie Feil, MD   20 mEq at 12/31/13 1001  . saccharomyces boulardii (FLORASTOR) capsule 250 mg  250 mg Oral BID Kinnie Feil, MD   250 mg at 12/31/13 1001  . sertraline (ZOLOFT) tablet 100 mg  100 mg Oral Daily Kinnie Feil, MD  100 mg at 12/31/13 1001  . sodium chloride 0.9 % bolus 500 mL  500 mL Intravenous Once Samuella Cota, MD 250 mL/hr at 12/31/13 1206 500 mL at 12/31/13 1206   Current Outpatient Prescriptions  Medication Sig Dispense Refill  . acidophilus (RISAQUAD) CAPS capsule Take 1 capsule by mouth daily.      Marland Kitchen albuterol (PROVENTIL HFA;VENTOLIN HFA) 108 (90 BASE) MCG/ACT inhaler Inhale 2 puffs into the lungs every 4 (four) hours as needed. Shortness of breath  1 Inhaler  12  . albuterol (PROVENTIL) (2.5 MG/3ML) 0.083% nebulizer solution Take 2.5 mg by nebulization 4 (four) times daily.      Marland Kitchen ALPRAZolam  (XANAX) 1 MG tablet Take 1 mg by mouth 3 (three) times daily as needed for anxiety.      Marland Kitchen aspirin 325 MG tablet Take 1 tablet (325 mg total) by mouth daily.  30 tablet  12  . aspirin EC 81 MG tablet Take 324 mg by mouth once. For chest pain.      . baclofen (LIORESAL) 10 MG tablet Take 5 mg by mouth 3 (three) times daily.      . cefTRIAXone (ROCEPHIN) 1 G injection Inject 1 g into the muscle once.      . cholecalciferol (VITAMIN D) 1000 UNITS tablet Take 1,000 Units by mouth daily.      . Cranberry 450 MG CAPS Take 1 capsule by mouth daily.      Marland Kitchen guaiFENesin (MUCINEX) 600 MG 12 hr tablet Take 600 mg by mouth 2 (two) times daily.      Marland Kitchen levofloxacin (LEVAQUIN) 500 MG tablet Take 500 mg by mouth daily.      Marland Kitchen levothyroxine (SYNTHROID, LEVOTHROID) 88 MCG tablet Take 1 tablet (88 mcg total) by mouth daily before breakfast.  30 tablet  12  . loratadine (CLARITIN) 10 MG tablet Take 10 mg by mouth daily.      Marland Kitchen losartan-hydrochlorothiazide (HYZAAR) 50-12.5 MG per tablet Take 1 tablet by mouth daily.        . nitroGLYCERIN (NITROSTAT) 0.4 MG SL tablet Place 1 tablet (0.4 mg total) under the tongue every 5 (five) minutes as needed for chest pain.  25 tablet  12  . nystatin (MYCOSTATIN) powder Apply 1 g topically 2 (two) times daily.      . pantoprazole (PROTONIX) 40 MG tablet Take 1 tablet (40 mg total) by mouth daily.  30 tablet  12  . polyethylene glycol (MIRALAX / GLYCOLAX) packet Take 17 g by mouth daily.      . potassium chloride SA (K-DUR,KLOR-CON) 20 MEQ tablet Take 20 mEq by mouth daily.      . predniSONE (DELTASONE) 5 MG tablet Take 1 tablet (5 mg total) by mouth daily.  30 tablet  12  . saccharomyces boulardii (FLORASTOR) 250 MG capsule Take 250 mg by mouth 2 (two) times daily.      . sertraline (ZOLOFT) 100 MG tablet Take 1 tablet (100 mg total) by mouth daily.      . traMADol (ULTRAM) 50 MG tablet Take 50 mg by mouth every 6 (six) hours as needed for moderate pain.      Marland Kitchen zolpidem (AMBIEN)  5 MG tablet Take 1 tablet (5 mg total) by mouth at bedtime.  30 tablet  0    Past Medical History  Diagnosis Date  . Cerebral palsy   . Asthma   . Seasonal allergies   . Thyroid disease   . HTN (hypertension)   .  Anxiety   . Arthritis   . Breast cancer     Right breast, infiltrating ductal.  . Anginal pain   . Osteoporosis 05/08/2012  . Osteoporosis 05/08/2012  . Stroke   . Angina at rest   . Chronic steroid use   . Pneumonia 09/2013  . Dysphagia   . Hypopotassemia     Past Surgical History  Procedure Laterality Date  . Breast lumpectomy    . Radical abdominal hysterectomy    . Dental extraction    . Right hand surgery      History   Social History  . Marital Status: Single    Spouse Name: N/A    Number of Children: N/A  . Years of Education: college   Occupational History  . disabled    Social History Main Topics  . Smoking status: Never Smoker   . Smokeless tobacco: Never Used  . Alcohol Use: No  . Drug Use: No  . Sexual Activity: No   Other Topics Concern  . Not on file   Social History Narrative  . No narrative on file     Family History  Problem Relation Age of Onset  . Cancer    . Diabetes    . Arthritis    . Asthma    . Heart attack Father   . Heart attack Mother      Prior to Admission medications   Medication Sig Start Date End Date Taking? Authorizing Provider  acidophilus (RISAQUAD) CAPS capsule Take 1 capsule by mouth daily.   Yes Historical Provider, MD  albuterol (PROVENTIL HFA;VENTOLIN HFA) 108 (90 BASE) MCG/ACT inhaler Inhale 2 puffs into the lungs every 4 (four) hours as needed. Shortness of breath 06/13/13  Yes Alonza Bogus, MD  albuterol (PROVENTIL) (2.5 MG/3ML) 0.083% nebulizer solution Take 2.5 mg by nebulization 4 (four) times daily.   Yes Historical Provider, MD  ALPRAZolam [name]) 1 MG tablet Take 1 mg by mouth 3 (three) times daily as needed for anxiety.   Yes Historical Provider, MD  aspirin 325 MG tablet Take 1  tablet (325 mg total) by mouth daily. 06/13/13  Yes Alonza Bogus, MD  aspirin EC 81 MG tablet Take 324 mg by mouth once. For chest pain.   Yes Historical Provider, MD  baclofen (LIORESAL) 10 MG tablet Take 5 mg by mouth 3 (three) times daily.   Yes Historical Provider, MD  cefTRIAXone (ROCEPHIN) 1 G injection Inject 1 g into the muscle once.   Yes Historical Provider, MD  cholecalciferol (VITAMIN D) 1000 UNITS tablet Take 1,000 Units by mouth daily.   Yes Historical Provider, MD  Cranberry 450 MG CAPS Take 1 capsule by mouth daily.   Yes Historical Provider, MD  guaiFENesin (MUCINEX) 600 MG 12 hr tablet Take 600 mg by mouth 2 (two) times daily.   Yes Historical Provider, MD  levofloxacin (LEVAQUIN) 500 MG tablet Take 500 mg by mouth daily.   Yes Historical Provider, MD  levothyroxine (SYNTHROID, LEVOTHROID) 88 MCG tablet Take 1 tablet (88 mcg total) by mouth daily before breakfast. 06/13/13  Yes Alonza Bogus, MD  loratadine (CLARITIN) 10 MG tablet Take 10 mg by mouth daily.   Yes Historical Provider, MD  losartan-hydrochlorothiazide (HYZAAR) 50-12.5 MG per tablet Take 1 tablet by mouth daily.     Yes Historical Provider, MD  nitroGLYCERIN (NITROSTAT) 0.4 MG SL tablet Place 1 tablet (0.4 mg total) under the tongue every 5 (five) minutes as needed for chest pain. 06/13/13  Yes Alonza Bogus, MD  nystatin (MYCOSTATIN) powder Apply 1 g topically 2 (two) times daily.   Yes Historical Provider, MD  pantoprazole (PROTONIX) 40 MG tablet Take 1 tablet (40 mg total) by mouth daily. 06/13/13  Yes Alonza Bogus, MD  polyethylene glycol [facility] / GLYCOLAX) packet Take 17 g by mouth daily.   Yes Historical Provider, MD  potassium chloride SA (K-DUR,KLOR-CON) 20 MEQ tablet Take 20 mEq by mouth daily.   Yes Historical Provider, MD  predniSONE (DELTASONE) 5 MG tablet Take 1 tablet (5 mg total) by mouth daily. 06/13/13  Yes Alonza Bogus, MD  saccharomyces boulardii (FLORASTOR) 250 MG capsule Take 250 mg  by mouth 2 (two) times daily.   Yes Historical Provider, MD  sertraline (ZOLOFT) 100 MG tablet Take 1 tablet (100 mg total) by mouth daily. 08/13/13  Yes Alonza Bogus, MD  traMADol (ULTRAM) 50 MG tablet Take 50 mg by mouth every 6 (six) hours as needed for moderate pain.   Yes Historical Provider, MD  zolpidem (AMBIEN) 5 MG tablet Take 1 tablet (5 mg total) by mouth at bedtime. 08/13/13  Yes Alonza Bogus, MD     Review of systems complete and found to be negative unless listed above in HPI     Physical exam Blood pressure 97/45, pulse 89, temperature 98.5 F (36.9 C), temperature source Oral, resp. rate 22, height 5\' 2"  (1.575 m), weight 225 lb (102.059 kg), SpO2 97.00%. General: NAD, able to speak clearly Neck: No JVD, no thyromegaly or thyroid nodule.  Lungs: Expiratory wheezes bilaterally with normal respiratory effort. CV: Nondisplaced PMI.  Heart regular S1/S2, no S3/S4, no murmur. Trace non-pitting peripheral edema.  No carotid bruit.  Normal pedal pulses.  Abdomen: Soft, nontender, no hepatosplenomegaly, no distention.  Skin: Intact without lesions or rashes.  Neurologic: Alert and oriented x 3.  Psych: Normal affect. Extremities: No clubbing or cyanosis.  HEENT: Normal.   Labs:   Lab Results  Component Value Date   WBC 6.5 12/31/2013   HGB 12.4 12/31/2013   HCT 36.9 12/31/2013   MCV 96.6 12/31/2013   PLT 126* 12/31/2013    Recent Labs Lab 12/31/13 0322  NA 146  K 3.8  CL 97  CO2 38*  BUN 18  CREATININE 0.44*  CALCIUM 9.1  GLUCOSE 150*   Lab Results  Component Value Date   CKTOTAL 128 05/28/2011   CKMB 4.7* 05/28/2011   TROPONINI 1.49* 12/31/2013    Lab Results  Component Value Date   CHOL 156 06/29/2012   Lab Results  Component Value Date   HDL 64 06/29/2012   Lab Results  Component Value Date   LDLCALC 66 06/29/2012   Lab Results  Component Value Date   TRIG 128 06/29/2012   Lab Results  Component Value Date   CHOLHDL 2.4 06/29/2012   No  results found for this basename: LDLDIRECT       EKG: See HPI  Studies: Dg Chest Port 1 View  12/31/2013   CLINICAL DATA:  Chest pain  EXAM: PORTABLE CHEST - 1 VIEW  COMPARISON:  08/09/2013  FINDINGS: Lung volumes are lower than previous. Chronic cardiomegaly. Mild pulmonary venous congestion. There is a right middle lobe and right base opacity. The lower lobe opacity is streaky in morphology. Chronic deformity of the right humeral head/neck.  IMPRESSION: Right middle and lower lobe opacities. The appearance is nonspecific, although findings favor atelectasis over pneumonia.   Electronically Signed   By: Roderic Palau  Watts M.D.   On: 12/31/2013 03:14   Echo and stress test: see HPI  ASSESSMENT AND PLAN:  1. NSTEMI: given that the troponins are now trending down, in the setting of nonspecific T wave abnormalities (with some mild T wave inversions inferiorly) on ECG, all in the context of influenza and possible community-acquired pneumonia, I recommend medical management for now. Coronary angiography with prior stress testing to potentially localize the area of ischemia can be performed electively once the patient has recovered from her respiratory illness. Would continue ASA and heparin for now. Unable to use beta blockers due to severe COPD with exacerbation. Would add statin therapy with Lipitor 80 mg daily. Heparin can likely be d/c tomorrow, at which point I would consider adding Plavix. If she continues to experience chest pain, rather than using oral/sublingual/topical nitrates which could lower her BP further, I would favor a nitroglycerin infusion which can readily be titrated. Will obtain an echocardiogram to assess systolic function and regional wall motion. 2. Acute hypoxic respiratory failure: pt has influenza A with radiographic evidence of atelectasis vs pneumonia. ABG consistent with chronic, compensated primary respiratory acidosis. Now on IV methylprednisolone. Planned Rx with Tamiflu and  empiric antimicrobial therapy for both influenza and community-acquired pneumonia, respectively.  Signed: Kate Sable, M.D., F.A.C.C.  12/31/2013, 1:09 PM

## 2013-12-31 NOTE — ED Notes (Signed)
CRITICAL VALUE ALERT  Critical value received:  CO2: 73.1  Date of notification:  12/31/13  Time of notification:  0612  Critical value read back: YES  Nurse who received alert:  Bobbye Morton  MD notified (1st page):    Time of first page:   MD notified (2nd page):  Time of second page:  Responding MD:  Sabra Heck  Time MD responded:  [phone]

## 2013-12-31 NOTE — H&P (Addendum)
[name] History and Physical  [name] [id number] DOB: 08-31-1941 DOA: 12/31/2013  Referring physician:  PCP: Alonza Bogus, MD  Specialists:   Chief Complaint: SOB, cough, chest pain  HPI: [name] is a 73 y.o. female with PMH of HTN, HPL, asthma, h/o CVA, cerebral palsy (non ambulatory), h/o recurrent respiratory failure presented with substernal non radiating chest pain, responded to NTG; ED work up showed positive trop/NSTEMI; ED/Dr. Sabra Heck d/w Dr. Wynonia Lawman who has requested that he be consult on the patient's arrival to the hospital at [facility]  Patient also reported SOB, productive cough, fever, chills; she is found to have hypoxia which improved on bronchodilators; CXR showed pneumonia    Review of Systems: The patient denies anorexia, fever, weight loss,, vision loss, decreased hearing, hoarseness,  syncope, dyspnea on exertion, peripheral edema, balance deficits, hemoptysis, abdominal pain, melena, hematochezia, severe indigestion/heartburn, hematuria, incontinence, genital sores, muscle weakness, suspicious skin lesions, transient blindness, difficulty walking, depression, unusual weight change, abnormal bleeding, enlarged lymph nodes, angioedema, and breast masses.    Past Medical History  Diagnosis Date  . Cerebral palsy   . Asthma   . Seasonal allergies   . Thyroid disease   . HTN (hypertension)   . Anxiety   . Arthritis   . Breast cancer     Right breast, infiltrating ductal.  . Anginal pain   . Osteoporosis 05/08/2012  . Osteoporosis 05/08/2012  . Stroke   . Angina at rest   . Chronic steroid use   . Pneumonia 09/2013  . Dysphagia   . Hypopotassemia    Past Surgical History  Procedure Laterality Date  . Breast lumpectomy    . Radical abdominal hysterectomy    . Dental extraction    . Right hand surgery     Social History:  reports that she has never smoked. She has never used smokeless tobacco. She reports that she does not drink  alcohol or use illicit drugs. NH;  where does patient live--home, ALF, SNF? and with whom if at home? No;  Can patient participate in ADLs?  Allergies  Allergen Reactions  . Naproxen Other (See Comments)    unknown    Family History  Problem Relation Age of Onset  . Cancer    . Diabetes    . Arthritis    . Asthma    . Heart attack Father   . Heart attack Mother     (be sure to complete)  Prior to Admission medications   Medication Sig Start Date End Date Taking? Authorizing Provider  baclofen (LIORESAL) 10 MG tablet Take 5 mg by mouth 3 (three) times daily.   Yes Historical Provider, MD  cefTRIAXone (ROCEPHIN) 1 G injection Inject 1 g into the muscle once.   Yes Historical Provider, MD  levofloxacin (LEVAQUIN) 500 MG tablet Take 500 mg by mouth daily.   Yes Historical Provider, MD  loratadine (CLARITIN) 10 MG tablet Take 10 mg by mouth daily.   Yes Historical Provider, MD  saccharomyces boulardii (FLORASTOR) 250 MG capsule Take 250 mg by mouth 2 (two) times daily.   Yes Historical Provider, MD  acetaminophen (TYLENOL) 325 MG tablet Take 2 tablets (650 mg total) by mouth every 6 (six) hours as needed. 08/13/13   Alonza Bogus, MD  albuterol (PROVENTIL HFA;VENTOLIN HFA) 108 (90 BASE) MCG/ACT inhaler Inhale 2 puffs into the lungs every 4 (four) hours as needed. Shortness of breath 06/13/13   Alonza Bogus, MD  albuterol (PROVENTIL) (2.5  MG/3ML) 0.083% nebulizer solution Take 2.5 mg by nebulization 4 (four) times daily.    Historical Provider, MD  ALPRAZolam [name]) 1 MG tablet Take 1 tablet (1 mg total) by mouth every 4 (four) hours as needed for anxiety. 08/13/13   Alonza Bogus, MD  aspirin 325 MG tablet Take 1 tablet (325 mg total) by mouth daily. 06/13/13   Alonza Bogus, MD  carbamide peroxide (DEBROX) 6.5 % otic solution Place 5 drops into both ears 2 (two) times daily. 11/23/13   Farrel Gobble, MD  cephALEXin (KEFLEX) 500 MG capsule Take 1 capsule (500 mg total) by  mouth 4 (four) times daily. 08/13/13   Alonza Bogus, MD  diphenhydrAMINE (BENADRYL) 25 mg capsule Take 1 capsule (25 mg total) by mouth at bedtime. 06/13/13   Alonza Bogus, MD  exemestane (AROMASIN) 25 MG tablet Take 1 tablet (25 mg total) by mouth daily after breakfast. 11/23/13   Farrel Gobble, MD  levothyroxine (SYNTHROID, LEVOTHROID) 88 MCG tablet Take 1 tablet (88 mcg total) by mouth daily before breakfast. 06/13/13   Alonza Bogus, MD  loperamide (IMODIUM) 2 MG capsule Take 2 mg by mouth 4 (four) times daily as needed for diarrhea or loose stools. No more than 8 capsules in a 24 hour period    Historical Provider, MD  losartan-hydrochlorothiazide (HYZAAR) 50-12.5 MG per tablet Take 1 tablet by mouth daily.      Historical Provider, MD  magnesium hydroxide (MILK OF MAGNESIA) 400 MG/5ML suspension Take 30 mLs by mouth at bedtime as needed. 08/13/13   Alonza Bogus, MD  nitroGLYCERIN (NITROSTAT) 0.4 MG SL tablet Place 1 tablet (0.4 mg total) under the tongue every 5 (five) minutes as needed for chest pain. 06/13/13   Alonza Bogus, MD  ondansetron (ZOFRAN) 4 MG tablet Take 1 tablet (4 mg total) by mouth every 6 (six) hours as needed for nausea. 08/13/13   Alonza Bogus, MD  pantoprazole (PROTONIX) 40 MG tablet Take 1 tablet (40 mg total) by mouth daily. 06/13/13   Alonza Bogus, MD  polyethylene glycol [facility] / Floria Raveling) packet Take 17 g by mouth daily.    Historical Provider, MD  potassium chloride SA (K-DUR,KLOR-CON) 20 MEQ tablet Take 1 tablet (20 mEq total) by mouth 2 (two) times daily. 08/13/13   Alonza Bogus, MD  predniSONE (DELTASONE) 5 MG tablet Take 1 tablet (5 mg total) by mouth daily. 06/13/13   Alonza Bogus, MD  sertraline (ZOLOFT) 100 MG tablet Take 1 tablet (100 mg total) by mouth daily. 08/13/13   Alonza Bogus, MD  traMADol (ULTRAM) 50 MG tablet Take 1 tablet (50 mg total) by mouth every 6 (six) hours as needed. 06/13/13   Alonza Bogus, MD  zolpidem  (AMBIEN) 5 MG tablet Take 1 tablet (5 mg total) by mouth at bedtime. 08/13/13   Alonza Bogus, MD   Physical Exam: Filed Vitals:   12/31/13 0530  BP: 109/65  Pulse:   Temp:   Resp:      General:  alert  Eyes: eom-i  ENT: no oral ulcers   Neck: supple   Cardiovascular: s1,s2 rrr  Respiratory: wheezing LL  Abdomen: soft, nt, nd   Skin: no rash   Musculoskeletal: no edema in LE  Psychiatric: no hallucinations   Neurologic: dysarthria chronic, otherwise CN 2-12 intact   Labs on Admission:  Basic Metabolic Panel:  Recent Labs Lab 12/31/13 0322  NA 146  K 3.8  CL 97  CO2 38*  GLUCOSE 150*  BUN 18  CREATININE 0.44*  CALCIUM 9.1   Liver Function Tests: No results found for this basename: AST, ALT, ALKPHOS, BILITOT, PROT, ALBUMIN,  in the last 168 hours No results found for this basename: LIPASE, AMYLASE,  in the last 168 hours No results found for this basename: AMMONIA,  in the last 168 hours CBC:  Recent Labs Lab 12/31/13 0322  WBC 6.5  NEUTROABS 5.5  HGB 12.4  HCT 36.9  MCV 96.6  PLT 126*   Cardiac Enzymes:  Recent Labs Lab 12/31/13 0322  TROPONINI 1.91*    BNP (last 3 results)  Recent Labs  06/11/13 0813 08/09/13 1147 12/31/13 0322  PROBNP 67.7 75.0 408.7*   CBG: No results found for this basename: GLUCAP,  in the last 168 hours  Radiological Exams on Admission: Dg Chest Port 1 View  12/31/2013   CLINICAL DATA:  Chest pain  EXAM: PORTABLE CHEST - 1 VIEW  COMPARISON:  08/09/2013  FINDINGS: Lung volumes are lower than previous. Chronic cardiomegaly. Mild pulmonary venous congestion. There is a right middle lobe and right base opacity. The lower lobe opacity is streaky in morphology. Chronic deformity of the right humeral head/neck.  IMPRESSION: Right middle and lower lobe opacities. The appearance is nonspecific, although findings favor atelectasis over pneumonia.   Electronically Signed   By: Jorje Guild M.D.   On: 12/31/2013  03:14    EKG: Independently reviewed. Sinus tachy, non specific t wave changes   Assessment/Plan Principal Problem:   Acute respiratory failure Active Problems:   Community acquired pneumonia   NSTEMI (non-ST elevated myocardial infarction)   Cerebral palsy   Asthma   HTN (hypertension)  73 y.o. female with PMH of HTN, HPL, asthma, h/o CVA, cerebral palsy (non ambulatory), h/o recurrent respiratory failure presented with substernal non radiating chest pain, productive cough, fever found to have NSTEMI, Pneumonia, respiratory failure  1. NSTEMI; chest pain improved on NTG; echo (2014): LVEF 55%; stress test (2014): normal  -cont IV heparin, ASA, statin, NTG; not on BB (severe asthma) ED d/w cardiology Dr. Wynonia Lawman who has requested to be consulted on the patient's arrival to the hospital at [facility]   2. Acute on chronic respiratory failure likely due to pneumonia, severe asthma; ABG [id number] -started on IV steroids, atx, bronchodilators, oxygen, prn NiPPV;  3. Sepsis/pneumonia; CXR: R middle lobe infiltrate; as above, check flu; need swallow eval   4. Hypothyroidism, cont levothyroxine;   5. HTN currently borderline hypotensive; hold diuretics   D/w patient, her sister, son at the bedside; patient is DNR  Need to inform cardiology upon arrival to [facility]; D/w Dr. Thereasa Solo   Cardiology;  if consultant consulted, please document name and whether formally or informally consulted  Code Status: DNR (must indicate code status--if unknown or must be presumed, indicate so) Family Communication: d/w patient, her sister, son at the bedside (indicate person spoken with, if applicable, with phone number if by telephone) Disposition Plan: [facility] (indicate anticipated LOS)  Time spent: >35 minutes   Kinnie Feil [name] Pager [phone]  If 7PM-7AM, please contact night-coverage [email or web address] Password [email or web address] 12/31/2013, 6:02 AM

## 2013-12-31 NOTE — ED Notes (Signed)
First bag of heparin still going. mischarted stopped on heparin drip.

## 2013-12-31 NOTE — Progress Notes (Signed)
Pt on 4lpm cann hr 80 rr 22 spo2 96 pt in no distress at this time will continue to monitor through the night

## 2013-12-31 NOTE — Progress Notes (Signed)
ANTIBIOTIC CONSULT NOTE - INITIAL  Pharmacy Consult for Vancomycin & Renal Adjustment antibiotics Indication: Sepsis, Pneumonia  Allergies  Allergen Reactions  . Naproxen Other (See Comments)    unknown    Patient Measurements: Height: 5\' 2"  (157.5 cm) Weight: 225 lb (102.059 kg) IBW/kg (Calculated) : 50.1 Adjusted Body Weight:   Vital Signs: Temp: 98.5 F (36.9 C) (01/12 1000) Temp src: Oral (01/12 1000) BP: 101/34 mmHg (01/12 1420) Pulse Rate: 89 (01/12 1200) Intake/Output from previous day:   Intake/Output from this shift:    Labs:  Recent Labs  12/31/13 0322  WBC 6.5  HGB 12.4  PLT 126*  CREATININE 0.44*   Estimated Creatinine Clearance: 71.1 ml/min (by C-G formula based on Cr of 0.44). No results found for this basename: VANCOTROUGH, Corlis Leak, VANCORANDOM, [place], GENTPEAK, GENTRANDOM, TOBRATROUGH, TOBRAPEAK, TOBRARND, AMIKACINPEAK, AMIKACINTROU, AMIKACIN,  in the last 72 hours   Microbiology: Recent Results (from the past 720 hour(s))  CULTURE, BLOOD (ROUTINE X 2)     Status: None   Collection Time    12/31/13  4:44 AM      Result Value Range Status   Specimen Description Blood BLOOD LEFT HAND   Final   Special Requests BOTTLES DRAWN AEROBIC AND ANAEROBIC Garrett   Final   Culture NO GROWTH <24 HRS   Final   Report Status PENDING   Incomplete  CULTURE, BLOOD (ROUTINE X 2)     Status: None   Collection Time    12/31/13  4:49 AM      Result Value Range Status   Specimen Description Blood LEFT ANTECUBITAL   Final   Special Requests BOTTLES DRAWN AEROBIC AND ANAEROBIC 6CC   Final   Culture NO GROWTH <24 HRS   Final   Report Status PENDING   Incomplete    Medical History: Past Medical History  Diagnosis Date  . Cerebral palsy   . Asthma   . Seasonal allergies   . Thyroid disease   . HTN (hypertension)   . Anxiety   . Arthritis   . Breast cancer     Right breast, infiltrating ductal.  . Anginal pain   . Osteoporosis 05/08/2012  . Osteoporosis  05/08/2012  . Stroke   . Angina at rest   . Chronic steroid use   . Pneumonia 09/2013  . Dysphagia   . Hypopotassemia     Medications:  Scheduled:  . aspirin  325 mg Oral Daily  . atorvastatin  80 mg Oral q1800  . baclofen  5 mg Oral TID  . carbamide peroxide  5 drop Both Ears BID  . levothyroxine  88 mcg Oral QAC breakfast  . loratadine  10 mg Oral Daily  . methylPREDNISolone (SOLU-MEDROL) injection  60 mg Intravenous Q6H  . oseltamivir  75 mg Oral BID  . pantoprazole  40 mg Oral Daily  . potassium chloride SA  20 mEq Oral BID  . saccharomyces boulardii  250 mg Oral BID  . sertraline  100 mg Oral Daily   Assessment: Sepsis, possibly secondary to influenza, possible HCAP Cefepime 1 GM IV every 8 hours started Vancomycin 2 GM load given (#2 Vancomycin 1 GM)  Goal of Therapy:  Vancomycin trough level 15-20 mcg/ml  Plan:  Vancomycin 1 GM IV every 12 hours Vancomycin trough at steady state Continue Cefepime 1 GM IV every 8 hours Labs per protocol  Abner Greenspan, Jannis Atkins Bennett 12/31/2013,4:40 PM

## 2013-12-31 NOTE — ED Provider Notes (Signed)
CSN: [id number]     Arrival date & time 12/31/13  0245 History   First MD Initiated Contact with Patient 12/31/13 0247     Chief Complaint  Patient presents with  . Respiratory Distress   (Consider location/radiation/quality/duration/timing/severity/associated sxs/prior Treatment) HPI Comments: The patient is in acute respiratory distress, she is able to speak in only one-word sentences, history is limited secondary to her acute condition  According to the medical record the patient has a history of significant asthma and reactive airway disease, frequent episodes of respiratory failure associated with asthma exacerbation or pulmonary infections. She also has a history of cerebral palsy that causes uncontrollable movements chronically, a history of breast cancer which was treated with lumpectomy and oral chemotherapy.  The patient presents with shortness of breath. The shortness of breath is severe, started several days ago it has gradually worsened, her oxygen saturations were 75% on 2 L by nasal cannula after she was given Xopenex at her nursing facility. Paramedics gave the patient 4 baby aspirin as well as nitroglycerin because she was also describing some discomfort in her chest and her back. The patient does endorse coughing, she denies any additional swelling compared to baseline.  The history is provided by the patient, the EMS personnel, the nursing home and medical records.    Past Medical History  Diagnosis Date  . Cerebral palsy   . Asthma   . Seasonal allergies   . Thyroid disease   . HTN (hypertension)   . Anxiety   . Arthritis   . Breast cancer     Right breast, infiltrating ductal.  . Anginal pain   . Osteoporosis 05/08/2012  . Osteoporosis 05/08/2012  . Stroke   . Angina at rest   . Chronic steroid use   . Pneumonia 09/2013  . Dysphagia   . Hypopotassemia    Past Surgical History  Procedure Laterality Date  . Breast lumpectomy    . Radical abdominal  hysterectomy    . Dental extraction    . Right hand surgery     Family History  Problem Relation Age of Onset  . Cancer    . Diabetes    . Arthritis    . Asthma    . Heart attack Father   . Heart attack Mother    History  Substance Use Topics  . Smoking status: Never Smoker   . Smokeless tobacco: Never Used  . Alcohol Use: No   OB History   Grav Para Term Preterm Abortions TAB SAB Ect Mult Living                 Review of Systems  All other systems reviewed and are negative.    Allergies  Naproxen  Home Medications   Current Outpatient Rx  Name  Route  Sig  Dispense  Refill  . baclofen (LIORESAL) 10 MG tablet   Oral   Take 5 mg by mouth 3 (three) times daily.         . cefTRIAXone (ROCEPHIN) 1 G injection   Intramuscular   Inject 1 g into the muscle once.         Marland Kitchen levofloxacin (LEVAQUIN) 500 MG tablet   Oral   Take 500 mg by mouth daily.         Marland Kitchen loratadine (CLARITIN) 10 MG tablet   Oral   Take 10 mg by mouth daily.         Marland Kitchen saccharomyces boulardii (FLORASTOR) 250 MG capsule  Oral   Take 250 mg by mouth 2 (two) times daily.         Marland Kitchen acetaminophen (TYLENOL) 325 MG tablet   Oral   Take 2 tablets (650 mg total) by mouth every 6 (six) hours as needed.         Marland Kitchen albuterol (PROVENTIL HFA;VENTOLIN HFA) 108 (90 BASE) MCG/ACT inhaler   Inhalation   Inhale 2 puffs into the lungs every 4 (four) hours as needed. Shortness of breath   1 Inhaler   12   . albuterol (PROVENTIL) (2.5 MG/3ML) 0.083% nebulizer solution   Nebulization   Take 2.5 mg by nebulization 4 (four) times daily.         Marland Kitchen ALPRAZolam (XANAX) 1 MG tablet   Oral   Take 1 tablet (1 mg total) by mouth every 4 (four) hours as needed for anxiety.   30 tablet   0   . aspirin 325 MG tablet   Oral   Take 1 tablet (325 mg total) by mouth daily.   30 tablet   12   . carbamide peroxide (DEBROX) 6.5 % otic solution   Both Ears   Place 5 drops into both ears 2 (two) times  daily.   15 mL   0     Warm bottle to 100 degrees F and instill in both e ...   . cephALEXin (KEFLEX) 500 MG capsule   Oral   Take 1 capsule (500 mg total) by mouth 4 (four) times daily.         . diphenhydrAMINE (BENADRYL) 25 mg capsule   Oral   Take 1 capsule (25 mg total) by mouth at bedtime.   30 capsule   0   . exemestane (AROMASIN) 25 MG tablet   Oral   Take 1 tablet (25 mg total) by mouth daily after breakfast.   30 tablet   6   . levothyroxine (SYNTHROID, LEVOTHROID) 88 MCG tablet   Oral   Take 1 tablet (88 mcg total) by mouth daily before breakfast.   30 tablet   12   . loperamide (IMODIUM) 2 MG capsule   Oral   Take 2 mg by mouth 4 (four) times daily as needed for diarrhea or loose stools. No more than 8 capsules in a 24 hour period         . losartan-hydrochlorothiazide (HYZAAR) 50-12.5 MG per tablet   Oral   Take 1 tablet by mouth daily.           . magnesium hydroxide (MILK OF MAGNESIA) 400 MG/5ML suspension   Oral   Take 30 mLs by mouth at bedtime as needed.   360 mL   0   . nitroGLYCERIN (NITROSTAT) 0.4 MG SL tablet   Sublingual   Place 1 tablet (0.4 mg total) under the tongue every 5 (five) minutes as needed for chest pain.   25 tablet   12   . ondansetron (ZOFRAN) 4 MG tablet   Oral   Take 1 tablet (4 mg total) by mouth every 6 (six) hours as needed for nausea.   20 tablet   0   . pantoprazole (PROTONIX) 40 MG tablet   Oral   Take 1 tablet (40 mg total) by mouth daily.   30 tablet   12   . polyethylene glycol (MIRALAX / GLYCOLAX) packet   Oral   Take 17 g by mouth daily.         . potassium chloride SA (K-DUR,KLOR-CON)  20 MEQ tablet   Oral   Take 1 tablet (20 mEq total) by mouth 2 (two) times daily.         . predniSONE (DELTASONE) 5 MG tablet   Oral   Take 1 tablet (5 mg total) by mouth daily.   30 tablet   12   . sertraline (ZOLOFT) 100 MG tablet   Oral   Take 1 tablet (100 mg total) by mouth daily.         .  traMADol (ULTRAM) 50 MG tablet   Oral   Take 1 tablet (50 mg total) by mouth every 6 (six) hours as needed.   60 tablet   5   . zolpidem (AMBIEN) 5 MG tablet   Oral   Take 1 tablet (5 mg total) by mouth at bedtime.   30 tablet   0    BP 109/65  Pulse 114  Temp(Src) 100.6 F (38.1 C) (Rectal)  Resp 24  Ht 5\' 2"  (1.575 m)  Wt 225 lb (102.059 kg)  BMI 41.14 kg/m2  SpO2 93% Physical Exam  Nursing note and vitals reviewed. Constitutional: She appears well-developed and well-nourished. She appears distressed.  HENT:  Head: Normocephalic and atraumatic.  Mouth/Throat: No oropharyngeal exudate.  Eyes: Conjunctivae and EOM are normal. Pupils are equal, round, and reactive to light. Right eye exhibits no discharge. Left eye exhibits no discharge. No scleral icterus.  Neck: Normal range of motion. Neck supple. No JVD present. No thyromegaly present.  Cardiovascular: Regular rhythm, normal heart sounds and intact distal pulses.  Tachycardia present.  Exam reveals no gallop and no friction rub.   No murmur heard. Pulses:      Radial pulses are 2+ on the right side, and 2+ on the left side.  Pulmonary/Chest: Accessory muscle usage present. Tachypnea noted. She is in respiratory distress. She has wheezes ( all lung fields). She has rales ( all lung fields but dominant at the bases).  Abdominal: Soft. Bowel sounds are normal. She exhibits no distension and no mass. There is no tenderness.  Musculoskeletal: Normal range of motion. She exhibits edema ( bilateral LE edema). She exhibits no tenderness.  Lymphadenopathy:    She has no cervical adenopathy.  Neurological: She is alert. Coordination normal.  Frequent uncontrolled movements of head, face, tongue.  Skin: Skin is warm and dry. No rash noted. No erythema.  Psychiatric: She has a normal mood and affect. Her behavior is normal.    ED Course  Procedures (including critical care time) Labs Review Labs Reviewed  BASIC METABOLIC PANEL  - Abnormal; Notable for the following:    CO2 38 (*)    Glucose, Bld 150 (*)    Creatinine, Ser 0.44 (*)    All other components within normal limits  CBC WITH DIFFERENTIAL - Abnormal; Notable for the following:    RBC 3.82 (*)    Platelets 126 (*)    Neutrophils Relative % 85 (*)    Lymphocytes Relative 8 (*)    Lymphs Abs 0.6 (*)    All other components within normal limits  TROPONIN I - Abnormal; Notable for the following:    Troponin I 1.91 (*)    All other components within normal limits  PRO B NATRIURETIC PEPTIDE - Abnormal; Notable for the following:    Pro B Natriuretic peptide (BNP) 408.7 (*)    All other components within normal limits  BLOOD GAS, ARTERIAL - Abnormal; Notable for the following:    pH, Arterial 7.291 (*)  pCO2 arterial 81.6 (*)    pO2, Arterial 145.0 (*)    Bicarbonate 38.1 (*)    Acid-Base Excess 11.4 (*)    All other components within normal limits  CULTURE, BLOOD (ROUTINE X 2)  CULTURE, BLOOD (ROUTINE X 2)  APTT  PROTIME-INR  INFLUENZA PANEL BY PCR (TYPE A & B, H1N1)  BLOOD GAS, ARTERIAL   Imaging Review Dg Chest Port 1 View  12/31/2013   CLINICAL DATA:  Chest pain  EXAM: PORTABLE CHEST - 1 VIEW  COMPARISON:  08/09/2013  FINDINGS: Lung volumes are lower than previous. Chronic cardiomegaly. Mild pulmonary venous congestion. There is a right middle lobe and right base opacity. The lower lobe opacity is streaky in morphology. Chronic deformity of the right humeral head/neck.  IMPRESSION: Right middle and lower lobe opacities. The appearance is nonspecific, although findings favor atelectasis over pneumonia.   Electronically Signed   By: Jorje Guild M.D.   On: 12/31/2013 03:14    EKG Interpretation    Date/Time:  Monday December 31 2013 03:51:11 EST Ventricular Rate:  110 PR Interval:  164 QRS Duration: 84 QT Interval:  328 QTC Calculation: 443 R Axis:   88 Text Interpretation:  Sinus tachycardia Premature atrial complexes Low voltage QRS  Borderline ECG When compared with ECG of 09-Aug-2013 11:58, Borderline criteria for Anterior infarct are no longer Present Rate faster Confirmed by Rondo Spittler  MD, Javiel Canepa ([id number]) on 12/31/2013 3:41:17 AM            MDM   1. NSTEMI (non-ST elevated myocardial infarction)   2. Acute respiratory distress    The patient appears acutely ill with acute respiratory failure. She is on chronic prednisone therapy and according to her vacation administration record she has been recently started on antibiotics, has been getting daily prednisone for chronic therapy. She is unsure she has had fevers but here appears to be hypoxic, tachycardic and with multiple abnormal lung sounds. I have discussed with her briefly the indications for intubation and she has stated that she would like to be intubated if needed and if it was a temporary measure. She seems to have her mental status intact and is answering questions appropriately albeit with difficulty given her respiratory distress. According to the discharge summary from August of 2014 by Dr. Luan Pulling she is a full code, we'll start with continuous nebulizer therapy, chest x-ray, oxygen supplemental, EKG, labs. I have personally seen and interpreted the EKG and find her to be a sinus tachycardia with nonspecific T-wave abnormalities but no ST elevation   The patient has been resistantly dyspneic, initial ABG showed CO2 retention, adequate oxygenation and a slight acidosis. White blood cell count was normal at 1478, metabolic panel normal. Her x-ray does show what looks like a right middle and lower lobe opacity that could be consistent with infectious etiology. Unfortunately the patient's troponin was elevated at close to 2 which is unexplained based on her reactive airway disease. She has been given heparin and nitroglycerin and I immediately discussed her care with the cardiologist, Dr. Wynonia Lawman. He has requested that he be consult on the patient's arrival to the hospital  at [facility] if seem necessary by the admitting service. Discussed with the hospitalist who will arrange for transfer.  The patient is currently on a heparin drip, she has been given aspirin prior to arrival, her BNP was 400.  CRITICAL CARE Performed by: Johnna Acosta Total critical care time: 35 Critical care time was exclusive of separately billable procedures  and treating other patients. Critical care was necessary to treat or prevent imminent or life-threatening deterioration. Critical care was time spent personally by me on the following activities: development of treatment plan with patient and/or surrogate as well as nursing, discussions with consultants, evaluation of patient's response to treatment, examination of patient, obtaining history from patient or surrogate, ordering and performing treatments and interventions, ordering and review of laboratory studies, ordering and review of radiographic studies, pulse oximetry and re-evaluation of patient's condition.    Johnna Acosta, MD 12/31/13 [phone]

## 2013-12-31 NOTE — ED Notes (Addendum)
Family updated. Pt resting.

## 2013-12-31 NOTE — ED Notes (Signed)
CRITICAL VALUE ALERT  Critical value received:  FLU A positive  Date of notification:  12/31/13  Time of notification:  0729  Critical value read back:yes  Nurse who received alert:  c Bartow Zylstra RN  MD notified (1st page):    Time of first page:    MD notified (2nd page):  Time of second page:  Responding MD:  Dr Sabra Heck  Time MD responded:  12/31/13

## 2013-12-31 NOTE — Progress Notes (Signed)
[name] PROGRESS NOTE  RHYTHM GUBBELS EUM:[id number] DOB: 09/21/1941 DOA: 12/31/2013 PCP: Alonza Bogus, MD  Summary: 73 year old woman with history of hypertension, cerebral palsy, CVA, recurrent respiratory failure presented with chest pain and shortness of breath. Initial evaluation revealed acute hypoxic respiratory failure, HCAP, NSTEMI. EDP discussed with cardiology at [facility] and transfer was planned. She remains in ED at [facility] awaiting a bed. After further assessment discussion with cardiology patient be managed medically here at [facility].  Assessment/Plan: 1. Acute hypoxic, hypercapneic respiratory failure with respiratory acidosis secondary to influenza in suspected pneumonia. Clinically improved with treatment in the emergency department with improvement in ABGs. Currently stable on nasal cannula. 2. Influenza A. 3. Sepsis, secondary to influenza, possible HCAP. 4. Possible HCAP. Patient has been treated with antibiotics as an outpatient, continue empiric treatment at this point. 5. NSTEMI. In context of acute influenza, possible pneumonia and hypoxic respiratory failure. Currently pain-free and troponin trending downwards. Management likely. 6. H/o asthma on chronic prednisone. Continue steroids for now. Possible asthma exacerbation. 7. Cerebral palsy 8. H/o breast cancer   Currently improved and stable on nasal cannula, pain free. She remains critically ill and prognosis is guarded. Discussed with son and patient at bedside, as well as cardiologist. Plan to admit to any pain for medical treatment of acute illness and NSTEMI. We also discussed CODE STATUS: The patient indicates at this point she would want CPR and intubation if needed.  Continue oxygen support, Tamiflu, empiric antibiotics.  Continue heparin infusion, ASA, statin, no BB secondary to severe asthma. Trend troponin, check 2-D echocardiogram. Cardiology consultation.  Basic metabolic panel, CBC, lactic acid the  morning  Pending studies:   Blood cultures   sputum culture if possible  Code Status:  full code  DVT prophylaxis: Lovenox  Family Communication: Discussed with son at bedside  Disposition Plan: Admit to step down unit.  Murray Hodgkins, MD  [name]  Pager [phone] If 7PM-7AM, please contact night-coverage at [email or web address], password [facility] 12/31/2013, 12:36 PM  LOS: 0 days   Consultants:  Cardiology  Procedures:  2-D echocardiogram  Antibiotics:  Zosyn 1/12   Cefepime 1/12  Vancomycin 1/12 >>   HPI/Subjective: Feels better. Breathing better. Chest pain has resolved. Some nausea.  Objective: Filed Vitals:   12/31/13 1120 12/31/13 1130 12/31/13 1140 12/31/13 1200  BP: 84/46  83/39 101/62  Pulse: 78 78 80 89  Temp:      TempSrc:      Resp: 20 20 20 17   Height:      Weight:      SpO2: 95% 97% 98% 97%   No intake or output data in the 24 hours ending 12/31/13 1236   Filed Weights   12/31/13 0251  Weight: 102.059 kg (225 lb)    Exam:   MAXIMUM TEMPERATURE 100.6. Respiratory rate 17-20s. Oxygen saturation stable on nasal cannula. Systolic blood pressure 61-950  General: Appears calm, acutely ill but not toxic. Chronic rhythmic movements of the face suggestive of tardive dyskinesia.  Psychiatric: Able to speak in full sentences. Speech fluent and clear.  Cardiovascular: Regular rate and rhythm. No murmur, rub gallop.  Respiratory fair air movement. No frank wheezes, rales or rhonchi. Mild to moderate increased respiratory effort.  Abdomen soft, nontender, nondistended  Data Reviewed:  Arterial pH improved to 7.291 >> 7.323, PCO2 has improved as well 81 >> 73 basic metabolic panel unremarkable  Troponin 1.91 >> 1.49  Influenza A+  EKG shows sinus tach, T wave inversion inferiorly, consider ischemia.  Scheduled Meds: . aspirin  325 mg Oral Daily  . baclofen  5 mg Oral TID  . carbamide peroxide  5 drop Both Ears BID  . levothyroxine   88 mcg Oral QAC breakfast  . loratadine  10 mg Oral Daily  . methylPREDNISolone (SOLU-MEDROL) injection  60 mg Intravenous Q6H  . pantoprazole  40 mg Oral Daily  . potassium chloride SA  20 mEq Oral BID  . saccharomyces boulardii  250 mg Oral BID  . sertraline  100 mg Oral Daily   Continuous Infusions: . heparin 12 Units/kg/hr (12/31/13 0509)  . sodium chloride 500 mL (12/31/13 1206)    Principal Problem:   Acute respiratory failure Active Problems:   Cerebral palsy   Asthma   NSTEMI (non-ST elevated myocardial infarction)   HTN (hypertension)   Sepsis   Influenza A   HCAP (healthcare-associated pneumonia)   Time spent 35 minutes

## 2014-01-01 DIAGNOSIS — I517 Cardiomegaly: Secondary | ICD-10-CM

## 2014-01-01 LAB — HEPARIN LEVEL (UNFRACTIONATED): Heparin Unfractionated: 0.5 IU/mL (ref 0.30–0.70)

## 2014-01-01 LAB — BASIC METABOLIC PANEL
BUN: 17 mg/dL (ref 6–23)
CHLORIDE: 101 meq/L (ref 96–112)
CO2: 39 meq/L — AB (ref 19–32)
Calcium: 7.8 mg/dL — ABNORMAL LOW (ref 8.4–10.5)
Creatinine, Ser: 0.45 mg/dL — ABNORMAL LOW (ref 0.50–1.10)
GFR calc Af Amer: >90 mL/min (ref >90)
GFR calc non Af Amer: >90 mL/min (ref >90)
Glucose, Bld: 121 mg/dL — ABNORMAL HIGH (ref 70–99)
Potassium: 4.5 mEq/L (ref 3.7–5.3)
SODIUM: 145 meq/L (ref 137–147)

## 2014-01-01 LAB — CBC
HEMATOCRIT: 37.6 % (ref 36.0–46.0)
HEMOGLOBIN: 11.1 g/dL — AB (ref 12.0–15.0)
MCH: 29.2 pg (ref 26.0–34.0)
MCHC: 29.5 g/dL — ABNORMAL LOW (ref 30.0–36.0)
MCV: 98.9 fL (ref 78.0–100.0)
Platelets: 126 10*3/uL — ABNORMAL LOW (ref 150–400)
RBC: 3.8 MIL/uL — ABNORMAL LOW (ref 3.87–5.11)
RDW: 14.6 % (ref 11.5–15.5)
WBC: 3.7 10*3/uL — ABNORMAL LOW (ref 4.0–10.5)

## 2014-01-01 MED ORDER — SODIUM CHLORIDE 0.9 % IV BOLUS (SEPSIS)
500.0000 mL | Freq: Once | INTRAVENOUS | Status: AC
  Administered 2014-01-01: 500 mL via INTRAVENOUS

## 2014-01-01 NOTE — Progress Notes (Signed)
Dr. Cindie Laroche is notified about pt's low BP. Order received and followed.

## 2014-01-01 NOTE — Progress Notes (Signed)
Voided incontinently in bed. Unable to collect urine specimen.

## 2014-01-01 NOTE — Progress Notes (Signed)
*  PRELIMINARY RESULTS* Echocardiogram 2D Echocardiogram has been performed.  [place], [place] 01/01/2014, 10:47 AM

## 2014-01-01 NOTE — Progress Notes (Signed)
Subjective: She was admitted yesterday with pneumonia and a non-STEMI. She says she feels better. She still feels "tight" in her chest. She is coughing and coughing up some sputum  Objective: Vital signs in last 24 hours: Temp:  [97.8 F (36.6 C)-98.8 F (37.1 C)] 97.8 F (36.6 C) (01/13 0415) Pulse Rate:  [71-89] 74 (01/13 0415) Resp:  [16-30] 16 (01/13 0415) BP: (81-122)/(34-76) 96/71 mmHg (01/13 0415) SpO2:  [90 %-99 %] 99 % (01/13 0415) Weight:  [101.3 kg (223 lb 5.2 oz)] 101.3 kg (223 lb 5.2 oz) (01/13 0500) Weight change: -0.759 kg (-1 lb 10.8 oz)    Intake/Output from previous day: 01/12 0701 - 01/13 0700 In: 450 [I.V.:450] Out: -   PHYSICAL EXAM General appearance: alert, cooperative and mild distress Resp: rhonchi bilaterally Cardio: regular rate and rhythm, S1, S2 normal, no murmur, click, rub or gallop GI: soft, non-tender; bowel sounds normal; no masses,  no organomegaly Extremities: extremities normal, atraumatic, no cyanosis or edema  Lab Results:    Basic Metabolic Panel:  Recent Labs  12/31/13 0322 01/01/14 0514  NA 146 145  K 3.8 4.5  CL 97 101  CO2 38* 39*  GLUCOSE 150* 121*  BUN 18 17  CREATININE 0.44* 0.45*  CALCIUM 9.1 7.8*   Liver Function Tests: No results found for this basename: AST, ALT, ALKPHOS, BILITOT, PROT, ALBUMIN,  in the last 72 hours No results found for this basename: LIPASE, AMYLASE,  in the last 72 hours No results found for this basename: AMMONIA,  in the last 72 hours CBC:  Recent Labs  12/31/13 0322 01/01/14 0514  WBC 6.5 3.7*  NEUTROABS 5.5  --   HGB 12.4 11.1*  HCT 36.9 37.6  MCV 96.6 98.9  PLT 126* 126*   Cardiac Enzymes:  Recent Labs  12/31/13 0830 12/31/13 1434 12/31/13 2014  TROPONINI 1.49* 1.49* 1.29*   BNP:  Recent Labs  12/31/13 0322  PROBNP 408.7*   D-Dimer: No results found for this basename: DDIMER,  in the last 72 hours CBG: No results found for this basename: GLUCAP,  in the last  72 hours Hemoglobin A1C: No results found for this basename: HGBA1C,  in the last 72 hours Fasting Lipid Panel: No results found for this basename: CHOL, HDL, LDLCALC, TRIG, CHOLHDL, LDLDIRECT,  in the last 72 hours Thyroid Function Tests: No results found for this basename: TSH, T4TOTAL, FREET4, T3FREE, THYROIDAB,  in the last 72 hours Anemia Panel: No results found for this basename: VITAMINB12, FOLATE, FERRITIN, TIBC, IRON, RETICCTPCT,  in the last 72 hours Coagulation:  Recent Labs  12/31/13 0322  LABPROT 13.2  INR 1.02   Urine Drug Screen: Drugs of Abuse  No results found for this basename: labopia, cocainscrnur, labbenz, amphetmu, thcu, labbarb    Alcohol Level: No results found for this basename: ETH,  in the last 72 hours Urinalysis: No results found for this basename: COLORURINE, APPERANCEUR, LABSPEC, PHURINE, GLUCOSEU, HGBUR, BILIRUBINUR, KETONESUR, PROTEINUR, UROBILINOGEN, NITRITE, LEUKOCYTESUR,  in the last 72 hours Misc. Labs:  ABGS  Recent Labs  12/31/13 0600  PHART 7.323*  PO2ART 90.5  TCO2 33.9  HCO3 36.8*   CULTURES Recent Results (from the past 240 hour(s))  CULTURE, BLOOD (ROUTINE X 2)     Status: None   Collection Time    12/31/13  4:44 AM      Result Value Range Status   Specimen Description BLOOD LEFT HAND   Final   Special Requests BOTTLES DRAWN AEROBIC AND  ANAEROBIC [place]   Final   Culture NO GROWTH 1 DAY   Final   Report Status PENDING   Incomplete  CULTURE, BLOOD (ROUTINE X 2)     Status: None   Collection Time    12/31/13  4:49 AM      Result Value Range Status   Specimen Description BLOOD LEFT ANTECUBITAL   Final   Special Requests BOTTLES DRAWN AEROBIC AND ANAEROBIC 6CC   Final   Culture NO GROWTH 1 DAY   Final   Report Status PENDING   Incomplete  MRSA PCR SCREENING     Status: None   Collection Time    12/31/13  6:44 PM      Result Value Range Status   MRSA by PCR NEGATIVE  NEGATIVE Final   Comment:            The GeneXpert  MRSA Assay (FDA     approved for NASAL specimens     only), is one component of a     comprehensive MRSA colonization     surveillance program. It is not     intended to diagnose MRSA     infection nor to guide or     monitor treatment for     MRSA infections.   Studies/Results: Dg Chest Port 1 View  12/31/2013   CLINICAL DATA:  Chest pain  EXAM: PORTABLE CHEST - 1 VIEW  COMPARISON:  08/09/2013  FINDINGS: Lung volumes are lower than previous. Chronic cardiomegaly. Mild pulmonary venous congestion. There is a right middle lobe and right base opacity. The lower lobe opacity is streaky in morphology. Chronic deformity of the right humeral head/neck.  IMPRESSION: Right middle and lower lobe opacities. The appearance is nonspecific, although findings favor atelectasis over pneumonia.   Electronically Signed   By: Jorje Guild M.D.   On: 12/31/2013 03:14    Medications:  Prior to Admission:  Prescriptions prior to admission  Medication Sig Dispense Refill  . acidophilus (RISAQUAD) CAPS capsule Take 1 capsule by mouth daily.      Marland Kitchen albuterol (PROVENTIL HFA;VENTOLIN HFA) 108 (90 BASE) MCG/ACT inhaler Inhale 2 puffs into the lungs every 4 (four) hours as needed. Shortness of breath  1 Inhaler  12  . albuterol (PROVENTIL) (2.5 MG/3ML) 0.083% nebulizer solution Take 2.5 mg by nebulization 4 (four) times daily.      Marland Kitchen ALPRAZolam (XANAX) 1 MG tablet Take 1 mg by mouth 3 (three) times daily as needed for anxiety.      Marland Kitchen aspirin 325 MG tablet Take 1 tablet (325 mg total) by mouth daily.  30 tablet  12  . aspirin EC 81 MG tablet Take 324 mg by mouth once. For chest pain.      . baclofen (LIORESAL) 10 MG tablet Take 5 mg by mouth 3 (three) times daily.      . cefTRIAXone (ROCEPHIN) 1 G injection Inject 1 g into the muscle once.      . cholecalciferol (VITAMIN D) 1000 UNITS tablet Take 1,000 Units by mouth daily.      . Cranberry 450 MG CAPS Take 1 capsule by mouth daily.      Marland Kitchen guaiFENesin (MUCINEX)  600 MG 12 hr tablet Take 600 mg by mouth 2 (two) times daily.      Marland Kitchen levofloxacin (LEVAQUIN) 500 MG tablet Take 500 mg by mouth daily.      Marland Kitchen levothyroxine (SYNTHROID, LEVOTHROID) 88 MCG tablet Take 1 tablet (88 mcg total) by mouth daily  before breakfast.  30 tablet  12  . loratadine (CLARITIN) 10 MG tablet Take 10 mg by mouth daily.      Marland Kitchen losartan-hydrochlorothiazide (HYZAAR) 50-12.5 MG per tablet Take 1 tablet by mouth daily.        . nitroGLYCERIN (NITROSTAT) 0.4 MG SL tablet Place 1 tablet (0.4 mg total) under the tongue every 5 (five) minutes as needed for chest pain.  25 tablet  12  . nystatin (MYCOSTATIN) powder Apply 1 g topically 2 (two) times daily.      . pantoprazole (PROTONIX) 40 MG tablet Take 1 tablet (40 mg total) by mouth daily.  30 tablet  12  . polyethylene glycol (MIRALAX / GLYCOLAX) packet Take 17 g by mouth daily.      . potassium chloride SA (K-DUR,KLOR-CON) 20 MEQ tablet Take 20 mEq by mouth daily.      . predniSONE (DELTASONE) 5 MG tablet Take 1 tablet (5 mg total) by mouth daily.  30 tablet  12  . saccharomyces boulardii (FLORASTOR) 250 MG capsule Take 250 mg by mouth 2 (two) times daily.      . sertraline (ZOLOFT) 100 MG tablet Take 1 tablet (100 mg total) by mouth daily.      . traMADol (ULTRAM) 50 MG tablet Take 50 mg by mouth every 6 (six) hours as needed for moderate pain.      Marland Kitchen zolpidem (AMBIEN) 5 MG tablet Take 1 tablet (5 mg total) by mouth at bedtime.  30 tablet  0   Scheduled: . aspirin  325 mg Oral Daily  . atorvastatin  80 mg Oral q1800  . baclofen  5 mg Oral TID  . carbamide peroxide  5 drop Both Ears BID  . ceFEPime (MAXIPIME) IV  1 g Intravenous Q8H  . diphenhydrAMINE  25 mg Oral QHS  . levothyroxine  88 mcg Oral QAC breakfast  . loratadine  10 mg Oral Daily  . methylPREDNISolone (SOLU-MEDROL) injection  60 mg Intravenous Q6H  . oseltamivir  75 mg Oral BID  . pantoprazole  40 mg Oral Daily  . potassium chloride SA  20 mEq Oral BID  .  saccharomyces boulardii  250 mg Oral BID  . sertraline  100 mg Oral Daily  . sodium chloride  3 mL Intravenous Q12H  . vancomycin  1,000 mg Intravenous Q12H  . zolpidem  5 mg Oral QHS   Continuous: . sodium chloride 1,000 mL (12/31/13 2236)  . heparin 1,250 Units/hr (12/31/13 2209)   RXV:[id number], acetaminophen, albuterol, ALPRAZolam, nitroGLYCERIN, ondansetron (ZOFRAN) IV  Assesment: She has acute respiratory failure which is multifactorial. She has healthcare associated pneumonia which is related to some extent at least to influenza A. She has a non-STEMI. She has cerebral palsy which is unchanged Principal Problem:   Acute respiratory failure Active Problems:   Cerebral palsy   Asthma   NSTEMI (non-ST elevated myocardial infarction)   HTN (hypertension)   Sepsis   Influenza A   HCAP (healthcare-associated pneumonia)    Plan: Continue with current treatments she has improved since yesterday    LOS: 1 day   Kimmora Risenhoover L 01/01/2014, 8:30 AM

## 2014-01-01 NOTE — Progress Notes (Signed)
UR chart review completed.  

## 2014-01-01 NOTE — Care Management Note (Signed)
    Page 1 of 1   01/01/2014     1:52:50 PM   CARE MANAGEMENT NOTE 01/01/2014  Patient:  [name], [name]   Account Number:  [id number]  Date Initiated:  01/01/2014  Documentation initiated by:  Theophilus Kinds  Subjective/Objective Assessment:   Pt admitted from [facility]. Pt will return to facility at discharge.     Action/Plan:   No CM needs noted. CSw to arrange discharge to facility when medically stable.   Anticipated DC Date:  01/05/2014   Anticipated DC Plan:  SKILLED NURSING FACILITY  In-house referral  Clinical Social Worker      DC Planning Services  CM consult      Choice offered to / List presented to:             Status of service:  Completed, signed off Medicare Important Message given?   (If response is "NO", the following Medicare IM given date fields will be blank) Date Medicare IM given:   Date Additional Medicare IM given:    Discharge Disposition:  [facility]  Per UR Regulation:    If discussed at Long Length of Stay Meetings, dates discussed:    Comments:  01/01/14 [place], RN BSN CM

## 2014-01-01 NOTE — Progress Notes (Signed)
Pt received some water. Pt was able to swallow without signs of aspiration.

## 2014-01-01 NOTE — Progress Notes (Signed)
  SPEECH PATHOLOGY: Pt interviewed briefly during lunch meal (full liquids) and was tolerating without incident. Pt reports that she has been seen by SLP at [facility] because she "choked on something", but preadmission diet is unknown. SLP call [facility] to speak with SLP. Will follow up as schedule permits. Thank you,  Genene Churn, CCC-SLP [phone]

## 2014-01-01 NOTE — Clinical Social Work Psychosocial (Signed)
Clinical Social Work Department BRIEF PSYCHOSOCIAL ASSESSMENT 01/01/2014  Patient:  [name], [name]     Account Number:  [id number]     Admit date:  12/31/2013  Clinical Social Worker:  Edwyna Shell, CLINICAL SOCIAL WORKER  Date/Time:  01/01/2014 11:00 AM  Referred by:  Physician  Date Referred:  01/01/2014 Referred for  SNF Placement   Other Referral:   Interview type:  Patient Other interview type:   Also spoke w sister, Ruby Cola, in room w patient consent    PSYCHOSOCIAL DATA Living Status:  FACILITY Admitted from facility:  Plain Level of care:  [place] Primary support name:  [name] Primary support relationship to patient:  [place] Degree of support available:   Supportive family, long term resident of [facility]    CURRENT CONCERNS Current Concerns  Post-Acute Placement   Other Concerns:    SOCIAL WORK ASSESSMENT / PLAN CSW met w patient at bedside, patient alert and oriented x4.  Has speech impairment, speaks slowly and with difficulty.  Confirms she is resident of [facility] and wants to return at discharge.  Has been primarily bed or wheelchair bound, but has been able to go to sisters at Christmas and weekly church services w assistance from [place] equipped w lift.  Sister visits frequently and is supportive and involved.    Spoke w [facility] admissions, Jackelyn Poling confirmed that patient has been resident since August 2014.  Is in Medicaid days since 11-20-2013,  receiving restorative therapy.  Assist  w all ADLs.  Facility is willing to take patient back at discharge, sister asked to contact [facility] to ask about bed holds.    Patient says "Donnald Garre been better" when asked how she is doing. [facility] is "OK" but has enjoyed chance to leave facility for church and home visits, would like to transition back home but understands her care needs are difficult to meet in the home setting,    CSW will continue to monitor and update SNF as needed.  Facility and patient are both willing for patient to return when medically stable.   Assessment/plan status:  Psychosocial Support/Ongoing Assessment of Needs Other assessment/ plan:   Information/referral to community resources:   [facility]    PATIENT'S/FAMILY'S RESPONSE TO PLAN OF CARE: Patient appreciative of contact w SNF and learning that she can return, was worried "they would give away my bed."        Edwyna Shell, LCSW Clinical Social Worker [phone])

## 2014-01-01 NOTE — Progress Notes (Signed)
[facility] for Heparin Indication: chest pain/ACS  Allergies  Allergen Reactions  . Naproxen Other (See Comments)    unknown   Patient Measurements: Height: 5\' 5"  (165.1 cm) Weight: 223 lb 5.2 oz (101.3 kg) IBW/kg (Calculated) : 57 Heparin Dosing Weight: 74.4 kg  Vital Signs: Temp: 97.8 F (36.6 C) (01/13 0415) Temp src: Axillary (01/13 0415) BP: 96/71 mmHg (01/13 0415) Pulse Rate: 74 (01/13 0415)  Labs:  Recent Labs  12/31/13 0322 12/31/13 0830 12/31/13 1434 12/31/13 2014 01/01/14 0514  HGB 12.4  --   --   --  11.1*  HCT 36.9  --   --   --  37.6  PLT 126*  --   --   --  126*  APTT 28  --   --   --   --   LABPROT 13.2  --   --   --   --   INR 1.02  --   --   --   --   HEPARINUNFRC  --   --  0.52  --  0.50  CREATININE 0.44*  --   --   --  0.45*  TROPONINI 1.91* 1.49* 1.49* 1.29*  --    Estimated Creatinine Clearance: 75 ml/min (by C-G formula based on Cr of 0.45).  Medical History: Past Medical History  Diagnosis Date  . Cerebral palsy   . Asthma   . Seasonal allergies   . Thyroid disease   . HTN (hypertension)   . Anxiety   . Arthritis   . Breast cancer     Right breast, infiltrating ductal.  . Anginal pain   . Osteoporosis 05/08/2012  . Osteoporosis 05/08/2012  . Stroke   . Angina at rest   . Chronic steroid use   . Pneumonia 09/2013  . Dysphagia   . Hypopotassemia    Medications:  Scheduled:  . aspirin  325 mg Oral Daily  . atorvastatin  80 mg Oral q1800  . baclofen  5 mg Oral TID  . carbamide peroxide  5 drop Both Ears BID  . ceFEPime (MAXIPIME) IV  1 g Intravenous Q8H  . diphenhydrAMINE  25 mg Oral QHS  . levothyroxine  88 mcg Oral QAC breakfast  . loratadine  10 mg Oral Daily  . methylPREDNISolone (SOLU-MEDROL) injection  60 mg Intravenous Q6H  . oseltamivir  75 mg Oral BID  . pantoprazole  40 mg Oral Daily  . potassium chloride SA  20 mEq Oral BID  . saccharomyces boulardii  250 mg Oral BID  .  sertraline  100 mg Oral Daily  . sodium chloride  3 mL Intravenous Q12H  . vancomycin  1,000 mg Intravenous Q12H  . zolpidem  5 mg Oral QHS   Assessment: Heparin for NSTEMI Heparin level is therapeutic.  Continue current Rx.  Duration of therapy per MD.  Goal of Therapy:  Heparin level 0.3-0.7 units/ml Monitor platelets by anticoagulation protocol: Yes   Plan:  Continue Heparin at 1250 units/hr Heparin level daily  Monitor platelets, CBC Duration of Heparin Rx per MD  Hart Robinsons A 01/01/2014,7:51 AM

## 2014-01-01 NOTE — Progress Notes (Signed)
SUBJECTIVE: Pt said she only has "a little bit of chest tightness" and also said "I think I need a breathing treatment". I spoke with the nurse who took care of her overnight. She had a hypotensive episode this morning shortly after receiving home meds which included Xanax and Benadryl. After she was awoken by nurse, her pressures came up to the 73-22 mmHg systolic range, and was then given a 500 cc bolus of NS and her SBP has been >100 mmHg. ECG from 0418 shows normal sinus rhythm with no significant ST-T abnormalities.     Intake/Output Summary (Last 24 hours) at 01/01/14 0254 Last data filed at 01/01/14 0400  Gross per 24 hour  Intake    450 ml  Output      0 ml  Net    450 ml    Current Facility-Administered Medications  Medication Dose Route Frequency Provider Last Rate Last Dose  . 0.9 %  sodium chloride infusion   Intravenous Continuous Kinnie Feil, MD 75 mL/hr at 12/31/13 2236 1,000 mL at 12/31/13 2236  . acetaminophen (TYLENOL) tablet 650 mg  650 mg Oral Q6H PRN Kinnie Feil, MD       Or  . acetaminophen (TYLENOL) suppository 650 mg  650 mg Rectal Q6H PRN Kinnie Feil, MD      . albuterol (PROVENTIL) (2.5 MG/3ML) 0.083% nebulizer solution 2.5 mg  2.5 mg Nebulization Q2H PRN Kinnie Feil, MD      . ALPRAZolam [name]) tablet 1 mg  1 mg Oral Q4H PRN Kinnie Feil, MD   1 mg at 01/01/14 0802  . aspirin tablet 325 mg  325 mg Oral Daily Kinnie Feil, MD   325 mg at 12/31/13 1001  . atorvastatin (LIPITOR) tablet 80 mg  80 mg Oral q1800 Herminio Commons, MD      . baclofen (LIORESAL) tablet 5 mg  5 mg Oral TID Kinnie Feil, MD   5 mg at 12/31/13 1652  . carbamide peroxide (DEBROX) 6.5 % otic solution 5 drop  5 drop Both Ears BID Kinnie Feil, MD   5 drop at 12/31/13 2245  . ceFEPIme (MAXIPIME) 1 g in dextrose 5 % 50 mL IVPB  1 g Intravenous Q8H Samuella Cota, MD 100 mL/hr at 01/01/14 0515 1 g at 01/01/14 0515  . diphenhydrAMINE (BENADRYL)  capsule 25 mg  25 mg Oral QHS Kinnie Feil, MD   25 mg at 12/31/13 2238  . heparin ADULT infusion 100 units/mL (25000 units/250 mL)  1,250 Units/hr Intravenous Continuous Samuella Cota, MD 12.5 mL/hr at 12/31/13 2209 1,250 Units/hr at 12/31/13 2209  . levothyroxine (SYNTHROID, LEVOTHROID) tablet 88 mcg  88 mcg Oral QAC breakfast Kinnie Feil, MD   88 mcg at 12/31/13 0839  . loratadine (CLARITIN) tablet 10 mg  10 mg Oral Daily Kinnie Feil, MD   10 mg at 12/31/13 1002  . methylPREDNISolone sodium succinate (SOLU-MEDROL) 125 mg/2 mL injection 60 mg  60 mg Intravenous Q6H Kinnie Feil, MD   60 mg at 01/01/14 0802  . nitroGLYCERIN (NITROSTAT) SL tablet 0.4 mg  0.4 mg Sublingual Q5 min PRN Kinnie Feil, MD      . ondansetron (ZOFRAN) injection 4 mg  4 mg Intravenous Q6H PRN Samuella Cota, MD      . oseltamivir (TAMIFLU) capsule 75 mg  75 mg Oral BID Samuella Cota, MD   75 mg  at 12/31/13 2237  . pantoprazole (PROTONIX) EC tablet 40 mg  40 mg Oral Daily Kinnie Feil, MD   40 mg at 12/31/13 1001  . potassium chloride SA (K-DUR,KLOR-CON) CR tablet 20 mEq  20 mEq Oral BID Kinnie Feil, MD   20 mEq at 12/31/13 2237  . saccharomyces boulardii (FLORASTOR) capsule 250 mg  250 mg Oral BID Kinnie Feil, MD   250 mg at 12/31/13 2238  . sertraline (ZOLOFT) tablet 100 mg  100 mg Oral Daily Kinnie Feil, MD   100 mg at 12/31/13 1001  . sodium chloride 0.9 % injection 3 mL  3 mL Intravenous Q12H Kinnie Feil, MD   3 mL at 12/31/13 2210  . vancomycin (VANCOCIN) IVPB 1000 mg/200 mL premix  1,000 mg Intravenous Q12H Samuella Cota, MD   1,000 mg at 12/31/13 2343  . zolpidem (AMBIEN) tablet 5 mg  5 mg Oral QHS Kinnie Feil, MD   5 mg at 12/31/13 2237    Filed Vitals:   01/01/14 0415 01/01/14 0500 01/01/14 0600 01/01/14 0700  BP: 96/71 75/35 100/56 99/68  Pulse: 74 69    Temp: 97.8 F (36.6 C)     TempSrc: Axillary     Resp: 16 16 18 19   Height:      Weight:   223 lb 5.2 oz (101.3 kg)    SpO2: 99% 95%      PHYSICAL EXAM General: NAD Neck: No JVD, no thyromegaly or thyroid nodule.  Lungs: Expiratory wheezes bilaterally with normal respiratory effort. CV: Nondisplaced PMI.  Regular rhythm, normal S1/S2, no S3/S4, no murmur.  No pretibial edema.  No carotid bruit.  Normal pedal pulses.  Abdomen: Soft, nontender, no hepatosplenomegaly, no distention.  Neurologic: Alert and oriented x 3.  Psych: Normal affect. Extremities: No clubbing or cyanosis.   TELEMETRY: Reviewed telemetry pt in normal sinus rhythm.   LABS: Basic Metabolic Panel:  Recent Labs  12/31/13 0322 01/01/14 0514  NA 146 145  K 3.8 4.5  CL 97 101  CO2 38* 39*  GLUCOSE 150* 121*  BUN 18 17  CREATININE 0.44* 0.45*  CALCIUM 9.1 7.8*   Liver Function Tests: No results found for this basename: AST, ALT, ALKPHOS, BILITOT, PROT, ALBUMIN,  in the last 72 hours No results found for this basename: LIPASE, AMYLASE,  in the last 72 hours CBC:  Recent Labs  12/31/13 0322 01/01/14 0514  WBC 6.5 3.7*  NEUTROABS 5.5  --   HGB 12.4 11.1*  HCT 36.9 37.6  MCV 96.6 98.9  PLT 126* 126*   Cardiac Enzymes:  Recent Labs  12/31/13 0830 12/31/13 1434 12/31/13 2014  TROPONINI 1.49* 1.49* 1.29*   BNP: No components found with this basename: POCBNP,  D-Dimer: No results found for this basename: DDIMER,  in the last 72 hours Hemoglobin A1C: No results found for this basename: HGBA1C,  in the last 72 hours Fasting Lipid Panel: No results found for this basename: CHOL, HDL, LDLCALC, TRIG, CHOLHDL, LDLDIRECT,  in the last 72 hours Thyroid Function Tests: No results found for this basename: TSH, T4TOTAL, FREET3, T3FREE, THYROIDAB,  in the last 72 hours Anemia Panel: No results found for this basename: VITAMINB12, FOLATE, FERRITIN, TIBC, IRON, RETICCTPCT,  in the last 72 hours  RADIOLOGY: Dg Chest Port 1 View  12/31/2013   CLINICAL DATA:  Chest pain  EXAM: PORTABLE CHEST - 1  VIEW  COMPARISON:  08/09/2013  FINDINGS: Lung volumes are lower than  previous. Chronic cardiomegaly. Mild pulmonary venous congestion. There is a right middle lobe and right base opacity. The lower lobe opacity is streaky in morphology. Chronic deformity of the right humeral head/neck.  IMPRESSION: Right middle and lower lobe opacities. The appearance is nonspecific, although findings favor atelectasis over pneumonia.   Electronically Signed   By: Jorje Guild M.D.   On: 12/31/2013 03:14      ASSESSMENT AND PLAN: 1. NSTEMI: given that the troponins are now trending down (now down to 1.29), with notable improvement of nonspecific T wave abnormalities (with some mild T wave inversions inferiorly) on ECG seen yesterday, all in the context of influenza and possible community-acquired pneumonia, I recommend continued medical management for now. Coronary angiography with prior stress testing to potentially localize the area of ischemia can be performed electively once the patient has recovered from her respiratory illness. Would continue ASA and heparin for now. Will start Plavix on 1/14. Unable to use beta blockers due to severe COPD with exacerbation. Statin therapy added (Lipitor 80 mg daily) on 1/12. She has no significant chest discomfort and given low BP, I am not inclined to add nitrates. If she has a significant recurrence of chest pain, rather than using oral/sublingual/topical nitrates which could lower her BP further, I would favor a nitroglycerin infusion which can readily be titrated. Echocardiogram today to assess systolic function and regional wall motion.  2. Acute hypoxic respiratory failure: pt has influenza A with radiographic evidence of atelectasis vs pneumonia. ABG consistent with chronic, compensated primary respiratory acidosis. Now on IV methylprednisolone, Cefepime,Tamiflu for both influenza and community-acquired pneumonia.    Kate Sable, M.D., F.A.C.C.

## 2014-01-02 ENCOUNTER — Encounter (HOSPITAL_COMMUNITY): Payer: Medicare Other

## 2014-01-02 ENCOUNTER — Inpatient Hospital Stay (HOSPITAL_COMMUNITY): Payer: Medicare Other

## 2014-01-02 LAB — HEPARIN LEVEL (UNFRACTIONATED): HEPARIN UNFRACTIONATED: 0.43 [IU]/mL (ref 0.30–0.70)

## 2014-01-02 MED ORDER — SODIUM CHLORIDE 0.9 % IJ SOLN
10.0000 mL | INTRAMUSCULAR | Status: DC | PRN
  Administered 2014-01-02: 40 mL

## 2014-01-02 MED ORDER — SODIUM CHLORIDE 0.9 % IJ SOLN
10.0000 mL | Freq: Two times a day (BID) | INTRAMUSCULAR | Status: DC
  Administered 2014-01-03 (×2): 10 mL

## 2014-01-02 MED ORDER — CLOPIDOGREL BISULFATE 75 MG PO TABS
75.0000 mg | ORAL_TABLET | Freq: Every day | ORAL | Status: DC
  Administered 2014-01-03 – 2014-01-04 (×2): 75 mg via ORAL
  Filled 2014-01-02 (×2): qty 1

## 2014-01-02 NOTE — Progress Notes (Signed)
[facility] for Heparin Indication: chest pain/ACS  Allergies  Allergen Reactions  . Naproxen Other (See Comments)    unknown   Patient Measurements: Height: 5\' 5"  (165.1 cm) Weight: 221 lb 1.9 oz (100.3 kg) IBW/kg (Calculated) : 57 Heparin Dosing Weight: 74.4 kg  Vital Signs: Temp: 97.5 F (36.4 C) (01/13 2000) BP: 112/65 mmHg (01/14 0600)  Labs:  Recent Labs  12/31/13 0322 12/31/13 0830 12/31/13 1434 12/31/13 2014 01/01/14 0514 01/02/14 0440  HGB 12.4  --   --   --  11.1*  --   HCT 36.9  --   --   --  37.6  --   PLT 126*  --   --   --  126*  --   APTT 28  --   --   --   --   --   LABPROT 13.2  --   --   --   --   --   INR 1.02  --   --   --   --   --   HEPARINUNFRC  --   --  0.52  --  0.50 0.43  CREATININE 0.44*  --   --   --  0.45*  --   TROPONINI 1.91* 1.49* 1.49* 1.29*  --   --    Estimated Creatinine Clearance: 74.6 ml/min (by C-G formula based on Cr of 0.45).  Medical History: Past Medical History  Diagnosis Date  . Cerebral palsy   . Asthma   . Seasonal allergies   . Thyroid disease   . HTN (hypertension)   . Anxiety   . Arthritis   . Breast cancer     Right breast, infiltrating ductal.  . Anginal pain   . Osteoporosis 05/08/2012  . Osteoporosis 05/08/2012  . Stroke   . Angina at rest   . Chronic steroid use   . Pneumonia 09/2013  . Dysphagia   . Hypopotassemia    Medications:  Scheduled:  . aspirin  325 mg Oral Daily  . atorvastatin  80 mg Oral q1800  . baclofen  5 mg Oral TID  . carbamide peroxide  5 drop Both Ears BID  . ceFEPime (MAXIPIME) IV  1 g Intravenous Q8H  . diphenhydrAMINE  25 mg Oral QHS  . levothyroxine  88 mcg Oral QAC breakfast  . loratadine  10 mg Oral Daily  . methylPREDNISolone (SOLU-MEDROL) injection  60 mg Intravenous Q6H  . oseltamivir  75 mg Oral BID  . pantoprazole  40 mg Oral Daily  . potassium chloride SA  20 mEq Oral BID  . saccharomyces boulardii  250 mg Oral BID  .  sertraline  100 mg Oral Daily  . sodium chloride  3 mL Intravenous Q12H  . vancomycin  1,000 mg Intravenous Q12H  . zolpidem  5 mg Oral QHS   Assessment: Heparin for NSTEMI Heparin level is therapeutic.  Continue current Rx.  Duration of therapy per MD.  Goal of Therapy:  Heparin level 0.3-0.7 units/ml Monitor platelets by anticoagulation protocol: Yes   Plan:  Continue Heparin at 1250 units/hr Heparin level daily  Monitor platelets, CBC Duration of Heparin Rx per MD  Hart Robinsons A 01/02/2014,7:51 AM

## 2014-01-02 NOTE — Progress Notes (Deleted)
Subjective:  Had PICC line placed today. No cardiac complaints today. Breathing ok  Objective:  Vital Signs in the last 24 hours: Temp:  [97.5 F (36.4 C)-97.9 F (36.6 C)] 97.9 F (36.6 C) (01/14 0800) Pulse Rate:  [71-77] 71 (01/14 0800) Resp:  [15-22] 18 (01/14 0800) BP: (91-178)/(49-141) 112/65 mmHg (01/14 0600) SpO2:  [94 %-97 %] 97 % (01/14 0800) Weight:  [221 lb 1.9 oz (100.3 kg)] 221 lb 1.9 oz (100.3 kg) (01/14 0500)  Intake/Output from previous day: 01/13 0701 - 01/14 0700 In: 2737.5 [I.V.:1887.5; IV Piggyback:850] Out: -  Intake/Output from this shift: Total I/O In: 40 [I.V.:40] Out: -   Physical Exam: NECK: Without JVD, HJR, or bruit LUNGS: Decreased breath sounds with diffuse inspir/exp wheezing throughout. HEART: Regular rate and rhythm, no murmur, gallop, rub, bruit, thrill, or heave EXTREMITIES: Without cyanosis, clubbing, or edema   Lab Results:  Recent Labs  12/31/13 0322 01/01/14 0514  WBC 6.5 3.7*  HGB 12.4 11.1*  PLT 126* 126*    Recent Labs  12/31/13 0322 01/01/14 0514  NA 146 145  K 3.8 4.5  CL 97 101  CO2 38* 39*  GLUCOSE 150* 121*  BUN 18 17  CREATININE 0.44* 0.45*    Recent Labs  12/31/13 1434 12/31/13 2014  TROPONINI 1.49* 1.29*   Hepatic Function Panel No results found for this basename: PROT, ALBUMIN, AST, ALT, ALKPHOS, BILITOT, BILIDIR, IBILI,  in the last 72 hours No results found for this basename: CHOL,  in the last 72 hours No results found for this basename: PROTIME,  in the last 72 hours  Imaging: 2Decho 01/01/13: Study Conclusions  - Left ventricle: The cavity size was normal. Wall thickness   was increased in a pattern of moderate LVH. Systolic   function was vigorous. The estimated ejection fraction was   in the range of 65% to 70%. Wall motion was normal; there   were no regional wall motion abnormalities. Features are   consistent with a pseudonormal left ventricular filling   pattern, with concomitant  abnormal relaxation and   increased filling pressure (grade 2 diastolic   dysfunction). - Left atrium: The atrium was mildly dilated. - Pulmonary arteries: PA peak pressure: 42mm Hg (S). Mild   pulmonary hypertension. - Inferior vena cava: The vessel was dilated; the   respirophasic diameter changes were blunted (< 50%);   findings are consistent with elevated central venous   pressure.  Cardiac Studies: Stress myoview 05/2013: IMPRESSION: Negative pharmacologic stress nuclear myocardial study.   Assessment/Plan:  NSTEMI: medical management for now. Stress myoview and cath can be done once patient recovers.Normal LV function on 2Decho and no WMA. Grade 2 diastolic dysfunction. Now on Plavix and ASA  Severe COPD with exacerbation, Hypoxic respiratory failure & pneumonia. On antibiotics/steroids. Can't use beta blockers.  Influenza A   Cerebral palsy  LOS: 2 days    Ermalinda Barrios 01/02/2014, 11:50 AM

## 2014-01-02 NOTE — Clinical Social Work Note (Signed)
Update given to [facility] admissions, patient can return to facility on IV abx w PICC line when medically ready for discharge.  Edwyna Shell, LCSW Clinical Social Worker [phone])

## 2014-01-02 NOTE — Evaluation (Signed)
Clinical/Bedside Swallow Evaluation Patient Details  Name: [name] MRN: [id number] Date of Birth: [birth year]  Today's Date: 01/02/2014 Time: 1245-8099 SLP Time Calculation (min): 20 min  Past Medical History:  Past Medical History  Diagnosis Date  . Cerebral palsy   . Asthma   . Seasonal allergies   . Thyroid disease   . HTN (hypertension)   . Anxiety   . Arthritis   . Breast cancer     Right breast, infiltrating ductal.  . Anginal pain   . Osteoporosis 05/08/2012  . Osteoporosis 05/08/2012  . Stroke   . Angina at rest   . Chronic steroid use   . Pneumonia 09/2013  . Dysphagia   . Hypopotassemia    Past Surgical History:  Past Surgical History  Procedure Laterality Date  . Breast lumpectomy    . Radical abdominal hysterectomy    . Dental extraction    . Right hand surgery     HPI:  73 year old woman with history of hypertension, cerebral palsy, CVA, recurrent respiratory failure presented with chest pain and shortness of breath. Initial evaluation revealed acute hypoxic respiratory failure, HCAP, NSTEMI. EDP discussed with cardiology at [facility] and transfer was planned. She remains in ED at [facility] awaiting a bed. After further assessment discussion with cardiology patient be managed medically here at [facility].   Assessment / Plan / Recommendation Clinical Impression  Pt seen today for BSE in room. She was sitting up in bed with good alertness and participation. Given ice chips, pureed items, and thin liquids, pt demonstrated compromised oral phase due to her hx of cerebral palsy, but was still able to maintain control of bolus before swallowing. She refused solids due to not being able to chew them well. She stated that all of her food before coming here was "blended in a blender" so it was assumed that she was on a pureed diet. Recommend puree diet/thin liquids with f/u for diet tolerance and possible advancement to mechanical soft if pt desires.     Aspiration Risk  Mild     Diet Recommendation Thin liquid;Dysphagia 1 (Puree)   Liquid Administration via: Straw;Spoon Medication Administration: Crushed with puree Supervision: Staff to assist with self feeding;Full supervision/cueing for compensatory strategies Compensations: Slow rate;Small sips/bites;Check for anterior loss;Effortful swallow Postural Changes and/or Swallow Maneuvers: Seated upright 90 degrees;Upright 30-60 min after meal    Other  Recommendations Oral Care Recommendations: Oral care BID      Frequency and Duration min 2x/week  2 weeks     Swallow Study    General Date of Onset: 01/02/14 HPI: 73 year old woman with history of hypertension, cerebral palsy, CVA, recurrent respiratory failure presented with chest pain and shortness of breath. Initial evaluation revealed acute hypoxic respiratory failure, HCAP, NSTEMI. EDP discussed with cardiology at [facility] and transfer was planned. She remains in ED at [facility] awaiting a bed. After further assessment discussion with cardiology patient be managed medically here at [facility]. Type of Study: Bedside swallow evaluation Diet Prior to this Study: Thin liquids Temperature Spikes Noted: No Respiratory Status: Nasal cannula Behavior/Cognition: Alert;Cooperative;Pleasant mood Oral Cavity - Dentition: Edentulous Self-Feeding Abilities: Total assist Patient Positioning: Upright in bed Baseline Vocal Quality: Hoarse Volitional Cough: Congested;Wet Volitional Swallow: Able to elicit    Oral/Motor/Sensory Function Overall Oral Motor/Sensory Function: Other (comment) (Pt demonstrates uncontrolled writhing movements of facial musculature consistent with her dx of cerebral palsy. )   Ice Chips Ice chips: Within functional limits Presentation: Spoon Other Comments: Pt had difficulty masticating d/t  edentulous status but was able to allow ice chip to melt and swallowed effectively.   Thin Liquid Thin Liquid: Within functional limits Presentation: Spoon;Straw    Nectar  Thick Nectar Thick Liquid: Not tested   Honey Thick Honey Thick Liquid: Not tested   Puree Puree: Within functional limits Presentation: Spoon Other Comments: Oral phase is compromised by uncontrolled facial movements, but pt took her time and maintained control of bolus before swallowing. She stated she had been working on this in [place] at her SNF.   Solid   GO    Solid: Not tested Other Comments: Solids were not tested d/t pt request. She stated that she prefers soft food only d/t her edentulous status.        Osmel Dykstra S 01/02/2014,11:45 AM

## 2014-01-02 NOTE — Progress Notes (Signed)
SUBJECTIVE: Pt says she has "chest tightness that comes and goes", none currently. Less short of breath. Has some calf cramping. Received PICC line this morning.      Intake/Output Summary (Last 24 hours) at 01/02/14 1121 Last data filed at 01/02/14 1017  Gross per 24 hour  Intake 2777.5 ml  Output      0 ml  Net 2777.5 ml    Current Facility-Administered Medications  Medication Dose Route Frequency Provider Last Rate Last Dose  . 0.9 %  sodium chloride infusion   Intravenous Continuous Kinnie Feil, MD 75 mL/hr at 01/02/14 5732    . acetaminophen (TYLENOL) tablet 650 mg  650 mg Oral Q6H PRN Kinnie Feil, MD       Or  . acetaminophen (TYLENOL) suppository 650 mg  650 mg Rectal Q6H PRN Kinnie Feil, MD      . albuterol (PROVENTIL) (2.5 MG/3ML) 0.083% nebulizer solution 2.5 mg  2.5 mg Nebulization Q2H PRN Kinnie Feil, MD   2.5 mg at 01/01/14 2326  . ALPRAZolam (XANAX) tablet 1 mg  1 mg Oral Q4H PRN Kinnie Feil, MD   1 mg at 01/02/14 1056  . aspirin tablet 325 mg  325 mg Oral Daily Kinnie Feil, MD   325 mg at 01/02/14 1059  . atorvastatin (LIPITOR) tablet 80 mg  80 mg Oral q1800 Herminio Commons, MD   80 mg at 01/01/14 1647  . baclofen (LIORESAL) tablet 5 mg  5 mg Oral TID Kinnie Feil, MD   5 mg at 01/02/14 1057  . carbamide peroxide (DEBROX) 6.5 % otic solution 5 drop  5 drop Both Ears BID Kinnie Feil, MD   5 drop at 01/02/14 1101  . ceFEPIme (MAXIPIME) 1 g in dextrose 5 % 50 mL IVPB  1 g Intravenous Q8H Samuella Cota, MD 100 mL/hr at 01/02/14 0609 1 g at 01/02/14 0609  . [START ON 01/03/2014] clopidogrel (PLAVIX) tablet 75 mg  75 mg Oral Q breakfast Herminio Commons, MD      . diphenhydrAMINE (BENADRYL) capsule 25 mg  25 mg Oral QHS Kinnie Feil, MD   25 mg at 01/01/14 2136  . levothyroxine (SYNTHROID, LEVOTHROID) tablet 88 mcg  88 mcg Oral QAC breakfast Kinnie Feil, MD   88 mcg at 01/02/14 1059  . loratadine (CLARITIN)  tablet 10 mg  10 mg Oral Daily Kinnie Feil, MD   10 mg at 01/02/14 1100  . methylPREDNISolone sodium succinate (SOLU-MEDROL) 125 mg/2 mL injection 60 mg  60 mg Intravenous Q6H Kinnie Feil, MD   60 mg at 01/02/14 1100  . nitroGLYCERIN (NITROSTAT) SL tablet 0.4 mg  0.4 mg Sublingual Q5 min PRN Kinnie Feil, MD      . ondansetron (ZOFRAN) injection 4 mg  4 mg Intravenous Q6H PRN Samuella Cota, MD      . oseltamivir (TAMIFLU) capsule 75 mg  75 mg Oral BID Samuella Cota, MD   75 mg at 01/02/14 1100  . pantoprazole (PROTONIX) EC tablet 40 mg  40 mg Oral Daily Kinnie Feil, MD   40 mg at 01/02/14 1100  . potassium chloride SA (K-DUR,KLOR-CON) CR tablet 20 mEq  20 mEq Oral BID Kinnie Feil, MD   20 mEq at 01/02/14 1056  . saccharomyces boulardii (FLORASTOR) capsule 250 mg  250 mg Oral BID Kinnie Feil, MD   250 mg at 01/02/14  1059  . sertraline (ZOLOFT) tablet 100 mg  100 mg Oral Daily Kinnie Feil, MD   100 mg at 01/02/14 1056  . sodium chloride 0.9 % injection 10-40 mL  10-40 mL Intracatheter PRN Alonza Bogus, MD   40 mL at 01/02/14 1017  . sodium chloride 0.9 % injection 3 mL  3 mL Intravenous Q12H Kinnie Feil, MD   3 mL at 01/01/14 2136  . vancomycin (VANCOCIN) IVPB 1000 mg/200 mL premix  1,000 mg Intravenous Q12H Samuella Cota, MD   1,000 mg at 01/02/14 1101  . zolpidem (AMBIEN) tablet 5 mg  5 mg Oral QHS Kinnie Feil, MD   5 mg at 01/01/14 2136    Filed Vitals:   01/02/14 0400 01/02/14 0500 01/02/14 0600 01/02/14 0800  BP: 98/57 100/60 112/65   Pulse:    71  Temp:    97.9 F (36.6 C)  TempSrc:    Oral  Resp: 16 16  18   Height:      Weight:  221 lb 1.9 oz (100.3 kg)    SpO2:    97%    PHYSICAL EXAM General: NAD  Neck: No JVD, no thyromegaly or thyroid nodule.  Lungs: Inspiratory and expiratory wheezes bilaterally with normal respiratory effort.  CV: Nondisplaced PMI. Regular rhythm, normal S1/S2, no S3/S4, no murmur. No pretibial  edema. No carotid bruit. Normal pedal pulses.  Abdomen: Soft, nontender, no hepatosplenomegaly, no distention.  Neurologic: Alert and oriented x 3.  Psych: Normal affect.  Extremities: No clubbing or cyanosis.    TELEMETRY: Reviewed telemetry pt in NSR.   LABS: Basic Metabolic Panel:  Recent Labs  12/31/13 0322 01/01/14 0514  NA 146 145  K 3.8 4.5  CL 97 101  CO2 38* 39*  GLUCOSE 150* 121*  BUN 18 17  CREATININE 0.44* 0.45*  CALCIUM 9.1 7.8*   Liver Function Tests: No results found for this basename: AST, ALT, ALKPHOS, BILITOT, PROT, ALBUMIN,  in the last 72 hours No results found for this basename: LIPASE, AMYLASE,  in the last 72 hours CBC:  Recent Labs  12/31/13 0322 01/01/14 0514  WBC 6.5 3.7*  NEUTROABS 5.5  --   HGB 12.4 11.1*  HCT 36.9 37.6  MCV 96.6 98.9  PLT 126* 126*   Cardiac Enzymes:  Recent Labs  12/31/13 0830 12/31/13 1434 12/31/13 2014  TROPONINI 1.49* 1.49* 1.29*   BNP: No components found with this basename: POCBNP,  D-Dimer: No results found for this basename: DDIMER,  in the last 72 hours Hemoglobin A1C: No results found for this basename: HGBA1C,  in the last 72 hours Fasting Lipid Panel: No results found for this basename: CHOL, HDL, LDLCALC, TRIG, CHOLHDL, LDLDIRECT,  in the last 72 hours Thyroid Function Tests: No results found for this basename: TSH, T4TOTAL, FREET3, T3FREE, THYROIDAB,  in the last 72 hours Anemia Panel: No results found for this basename: VITAMINB12, FOLATE, FERRITIN, TIBC, IRON, RETICCTPCT,  in the last 72 hours  RADIOLOGY: Dg Chest Port 1 View  01/02/2014   CLINICAL DATA:  PICC line placement  EXAM: PORTABLE CHEST - 1 VIEW  COMPARISON:  Portable exam 1914 hr compared to 12/31/2013  FINDINGS: Left arm PICC line tip projects over right atrium; recommend withdrawal 4 cm.  Enlargement of cardiac silhouette with pulmonary vascular congestion.  Right basilar atelectasis versus infiltrate.  Remaining lungs clear.   No definite pleural effusion or pneumothorax.  Bones demineralized.  IMPRESSION: Persistent right basilar atelectasis versus  infiltrate.  Tip of left arm PICC line projects over right atrium; recommend withdrawal 4 cm.   Electronically Signed   By: Lavonia Dana M.D.   On: 01/02/2014 10:51   Dg Chest Port 1 View  12/31/2013   CLINICAL DATA:  Chest pain  EXAM: PORTABLE CHEST - 1 VIEW  COMPARISON:  08/09/2013  FINDINGS: Lung volumes are lower than previous. Chronic cardiomegaly. Mild pulmonary venous congestion. There is a right middle lobe and right base opacity. The lower lobe opacity is streaky in morphology. Chronic deformity of the right humeral head/neck.  IMPRESSION: Right middle and lower lobe opacities. The appearance is nonspecific, although findings favor atelectasis over pneumonia.   Electronically Signed   By: Jorje Guild M.D.   On: 12/31/2013 03:14      ASSESSMENT AND PLAN: 1. NSTEMI: last troponin 1.29. No significant chest pain. NSTEMI occurred in the context of influenza and possible community-acquired pneumonia. I recommend continued medical management for now. Coronary angiography with prior stress testing to potentially localize the area of ischemia can be performed electively once the patient has recovered from her respiratory illness. Would continue ASA and I will d/c heparin and start Plavix (give for 1 month only). Unable to use beta blockers due to severe COPD with exacerbation. Statin therapy added (Lipitor 80 mg daily) on 1/12. She has no significant chest discomfort and given episodic hypotension, I am not inclined to add nitrates. If she has a significant recurrence of chest pain, rather than using oral/sublingual/topical nitrates which could lower her BP further, I would favor a nitroglycerin infusion which can readily be titrated. Echocardiogram revealed normal LV systolic function (EF [id number]), with grade II diastolic dysfunction and normal regional wall motion.  2. Acute  hypoxic respiratory failure: pt has influenza A with radiographic evidence of persistent right basilar atelectasis vs infiltrate. Prior ABG consistent with chronic, compensated primary respiratory acidosis. On IV methylprednisolone, Cefepime,Tamiflu for both influenza and community-acquired pneumonia.    Kate Sable, M.D., F.A.C.C.

## 2014-01-02 NOTE — Progress Notes (Signed)
Subjective: She says she feels better. She had some more trouble with somewhat low blood pressures during the night. Yesterday her blood pressure came up after she was awake and more active. She says she still has some cough and congestion. She denies any significant chest pain.  Objective: Vital signs in last 24 hours: Temp:  [97.5 F (36.4 C)] 97.5 F (36.4 C) (01/13 2000) Pulse Rate:  [76-77] 76 (01/13 1600) Resp:  [15-22] 16 (01/14 0500) BP: (91-178)/(49-141) 112/65 mmHg (01/14 0600) SpO2:  [94 %-97 %] 94 % (01/13 2328) Weight:  [100.3 kg (221 lb 1.9 oz)] 100.3 kg (221 lb 1.9 oz) (01/14 0500) Weight change: -1 kg (-2 lb 3.3 oz) Last BM Date: 12/30/13  Intake/Output from previous day: 01/13 0701 - 01/14 0700 In: 2725 [I.V.:1875; IV Piggyback:850] Out: -   PHYSICAL EXAM General appearance: alert, cooperative and morbidly obese Resp: wheezes bilaterally Cardio: regular rate and rhythm, S1, S2 normal, no murmur, click, rub or gallop GI: soft, non-tender; bowel sounds normal; no masses,  no organomegaly Extremities: extremities normal, atraumatic, no cyanosis or edema  Lab Results:    Basic Metabolic Panel:  Recent Labs  12/31/13 0322 01/01/14 0514  NA 146 145  K 3.8 4.5  CL 97 101  CO2 38* 39*  GLUCOSE 150* 121*  BUN 18 17  CREATININE 0.44* 0.45*  CALCIUM 9.1 7.8*   Liver Function Tests: No results found for this basename: AST, ALT, ALKPHOS, BILITOT, PROT, ALBUMIN,  in the last 72 hours No results found for this basename: LIPASE, AMYLASE,  in the last 72 hours No results found for this basename: AMMONIA,  in the last 72 hours CBC:  Recent Labs  12/31/13 0322 01/01/14 0514  WBC 6.5 3.7*  NEUTROABS 5.5  --   HGB 12.4 11.1*  HCT 36.9 37.6  MCV 96.6 98.9  PLT 126* 126*   Cardiac Enzymes:  Recent Labs  12/31/13 0830 12/31/13 1434 12/31/13 2014  TROPONINI 1.49* 1.49* 1.29*   BNP:  Recent Labs  12/31/13 0322  PROBNP 408.7*   D-Dimer: No  results found for this basename: DDIMER,  in the last 72 hours CBG: No results found for this basename: GLUCAP,  in the last 72 hours Hemoglobin A1C: No results found for this basename: HGBA1C,  in the last 72 hours Fasting Lipid Panel: No results found for this basename: CHOL, HDL, LDLCALC, TRIG, CHOLHDL, LDLDIRECT,  in the last 72 hours Thyroid Function Tests: No results found for this basename: TSH, T4TOTAL, FREET4, T3FREE, THYROIDAB,  in the last 72 hours Anemia Panel: No results found for this basename: VITAMINB12, FOLATE, FERRITIN, TIBC, IRON, RETICCTPCT,  in the last 72 hours Coagulation:  Recent Labs  12/31/13 0322  LABPROT 13.2  INR 1.02   Urine Drug Screen: Drugs of Abuse  No results found for this basename: labopia, cocainscrnur, labbenz, amphetmu, thcu, labbarb    Alcohol Level: No results found for this basename: ETH,  in the last 72 hours Urinalysis: No results found for this basename: COLORURINE, APPERANCEUR, LABSPEC, PHURINE, GLUCOSEU, HGBUR, BILIRUBINUR, KETONESUR, PROTEINUR, UROBILINOGEN, NITRITE, LEUKOCYTESUR,  in the last 72 hours Misc. Labs:  ABGS  Recent Labs  12/31/13 0600  PHART 7.323*  PO2ART 90.5  TCO2 33.9  HCO3 36.8*   CULTURES Recent Results (from the past 240 hour(s))  CULTURE, BLOOD (ROUTINE X 2)     Status: None   Collection Time    12/31/13  4:44 AM      Result Value Range Status  Specimen Description BLOOD LEFT HAND   Final   Special Requests BOTTLES DRAWN AEROBIC AND ANAEROBIC [place]   Final   Culture NO GROWTH 1 DAY   Final   Report Status PENDING   Incomplete  CULTURE, BLOOD (ROUTINE X 2)     Status: None   Collection Time    12/31/13  4:49 AM      Result Value Range Status   Specimen Description BLOOD LEFT ANTECUBITAL   Final   Special Requests BOTTLES DRAWN AEROBIC AND ANAEROBIC 6CC   Final   Culture NO GROWTH 1 DAY   Final   Report Status PENDING   Incomplete  CULTURE, RESPIRATORY (NON-EXPECTORATED)     Status: None    Collection Time    12/31/13  2:48 PM      Result Value Range Status   Specimen Description SPUTUM EXPECTORATED   Final   Special Requests NONE   Final   Gram Stain     Final   Value: ABUNDANT WBC PRESENT, PREDOMINANTLY PMN     RARE SQUAMOUS EPITHELIAL CELLS PRESENT     RARE GRAM POSITIVE COCCI     IN PAIRS     Performed at [facility]   Culture PENDING   Incomplete   Report Status PENDING   Incomplete  MRSA PCR SCREENING     Status: None   Collection Time    12/31/13  6:44 PM      Result Value Range Status   MRSA by PCR NEGATIVE  NEGATIVE Final   Comment:            The GeneXpert MRSA Assay (FDA     approved for NASAL specimens     only), is one component of a     comprehensive MRSA colonization     surveillance program. It is not     intended to diagnose MRSA     infection nor to guide or     monitor treatment for     MRSA infections.   Studies/Results: No results found.  Medications:  Prior to Admission:  Prescriptions prior to admission  Medication Sig Dispense Refill  . acidophilus (RISAQUAD) CAPS capsule Take 1 capsule by mouth daily.      Marland Kitchen albuterol (PROVENTIL HFA;VENTOLIN HFA) 108 (90 BASE) MCG/ACT inhaler Inhale 2 puffs into the lungs every 4 (four) hours as needed. Shortness of breath  1 Inhaler  12  . albuterol (PROVENTIL) (2.5 MG/3ML) 0.083% nebulizer solution Take 2.5 mg by nebulization 4 (four) times daily.      Marland Kitchen ALPRAZolam (XANAX) 1 MG tablet Take 1 mg by mouth 3 (three) times daily as needed for anxiety.      Marland Kitchen aspirin 325 MG tablet Take 1 tablet (325 mg total) by mouth daily.  30 tablet  12  . aspirin EC 81 MG tablet Take 324 mg by mouth once. For chest pain.      . baclofen (LIORESAL) 10 MG tablet Take 5 mg by mouth 3 (three) times daily.      . cefTRIAXone (ROCEPHIN) 1 G injection Inject 1 g into the muscle once.      . cholecalciferol (VITAMIN D) 1000 UNITS tablet Take 1,000 Units by mouth daily.      . Cranberry 450 MG CAPS Take 1 capsule by  mouth daily.      Marland Kitchen guaiFENesin (MUCINEX) 600 MG 12 hr tablet Take 600 mg by mouth 2 (two) times daily.      Marland Kitchen levofloxacin (LEVAQUIN)  500 MG tablet Take 500 mg by mouth daily.      Marland Kitchen levothyroxine (SYNTHROID, LEVOTHROID) 88 MCG tablet Take 1 tablet (88 mcg total) by mouth daily before breakfast.  30 tablet  12  . loratadine (CLARITIN) 10 MG tablet Take 10 mg by mouth daily.      Marland Kitchen losartan-hydrochlorothiazide (HYZAAR) 50-12.5 MG per tablet Take 1 tablet by mouth daily.        . nitroGLYCERIN (NITROSTAT) 0.4 MG SL tablet Place 1 tablet (0.4 mg total) under the tongue every 5 (five) minutes as needed for chest pain.  25 tablet  12  . nystatin (MYCOSTATIN) powder Apply 1 g topically 2 (two) times daily.      . pantoprazole (PROTONIX) 40 MG tablet Take 1 tablet (40 mg total) by mouth daily.  30 tablet  12  . polyethylene glycol (MIRALAX / GLYCOLAX) packet Take 17 g by mouth daily.      . potassium chloride SA (K-DUR,KLOR-CON) 20 MEQ tablet Take 20 mEq by mouth daily.      . predniSONE (DELTASONE) 5 MG tablet Take 1 tablet (5 mg total) by mouth daily.  30 tablet  12  . saccharomyces boulardii (FLORASTOR) 250 MG capsule Take 250 mg by mouth 2 (two) times daily.      . sertraline (ZOLOFT) 100 MG tablet Take 1 tablet (100 mg total) by mouth daily.      . traMADol (ULTRAM) 50 MG tablet Take 50 mg by mouth every 6 (six) hours as needed for moderate pain.      Marland Kitchen zolpidem (AMBIEN) 5 MG tablet Take 1 tablet (5 mg total) by mouth at bedtime.  30 tablet  0   Scheduled: . aspirin  325 mg Oral Daily  . atorvastatin  80 mg Oral q1800  . baclofen  5 mg Oral TID  . carbamide peroxide  5 drop Both Ears BID  . ceFEPime (MAXIPIME) IV  1 g Intravenous Q8H  . diphenhydrAMINE  25 mg Oral QHS  . levothyroxine  88 mcg Oral QAC breakfast  . loratadine  10 mg Oral Daily  . methylPREDNISolone (SOLU-MEDROL) injection  60 mg Intravenous Q6H  . oseltamivir  75 mg Oral BID  . pantoprazole  40 mg Oral Daily  . potassium  chloride SA  20 mEq Oral BID  . saccharomyces boulardii  250 mg Oral BID  . sertraline  100 mg Oral Daily  . sodium chloride  3 mL Intravenous Q12H  . vancomycin  1,000 mg Intravenous Q12H  . zolpidem  5 mg Oral QHS   Continuous: . sodium chloride 75 mL/hr at 01/02/14 0611  . heparin 1,250 Units/hr (01/02/14 0500)   [id number], acetaminophen, albuterol, ALPRAZolam, nitroGLYCERIN, ondansetron (ZOFRAN) IV  Assesment: She was admitted with acute respiratory failure from healthcare associated pneumonia related to influenza A. She appear to be somewhat septic. She's had a non-STEMI. Her blood pressure is still somewhat "soft". She has a history of asthma/COPD and is still wheezing. She seems to be doing okay as far as her cardiac situation is concerned. Principal Problem:   Acute respiratory failure Active Problems:   Cerebral palsy   Asthma   NSTEMI (non-ST elevated myocardial infarction)   HTN (hypertension)   Sepsis   Influenza A   HCAP (healthcare-associated pneumonia)    Plan: Continue current treatments. She may be able to move from the intensive care unit if her blood pressure stabilizes and remains up. She has poor venous access I think she's going to  need a PICC line    LOS: 2 days   Lilliane Sposito L 01/02/2014, 8:01 AM

## 2014-01-02 NOTE — Progress Notes (Signed)
Called report to Sharyn Blitz, RN on Dept 300.  Verbalized understanding.  Pt transferred to rm. 330 via staff in safe and stable condition. Schonewitz, Eulis Canner 01/02/2014

## 2014-01-03 ENCOUNTER — Encounter: Payer: Self-pay | Admitting: *Deleted

## 2014-01-03 DIAGNOSIS — J45901 Unspecified asthma with (acute) exacerbation: Secondary | ICD-10-CM

## 2014-01-03 DIAGNOSIS — J189 Pneumonia, unspecified organism: Secondary | ICD-10-CM

## 2014-01-03 DIAGNOSIS — J9610 Chronic respiratory failure, unspecified whether with hypoxia or hypercapnia: Secondary | ICD-10-CM

## 2014-01-03 DIAGNOSIS — J111 Influenza due to unidentified influenza virus with other respiratory manifestations: Secondary | ICD-10-CM

## 2014-01-03 DIAGNOSIS — J441 Chronic obstructive pulmonary disease with (acute) exacerbation: Secondary | ICD-10-CM

## 2014-01-03 DIAGNOSIS — R0902 Hypoxemia: Secondary | ICD-10-CM

## 2014-01-03 DIAGNOSIS — IMO0002 Reserved for concepts with insufficient information to code with codable children: Secondary | ICD-10-CM

## 2014-01-03 LAB — HEPARIN LEVEL (UNFRACTIONATED)

## 2014-01-03 NOTE — Progress Notes (Signed)
Subjective: She says she feels a little better. She has no more chest pain. Her breathing is doing okay.  Objective: Vital signs in last 24 hours: Temp:  [98.8 F (37.1 C)] 98.8 F (37.1 C) (01/15 0425) Pulse Rate:  [69-80] 69 (01/15 0425) Resp:  [18-22] 20 (01/15 0425) BP: (107-172)/(61-101) 172/91 mmHg (01/15 0425) SpO2:  [96 %-99 %] 96 % (01/15 0425) Weight change:  Last BM Date: 12/30/13  Intake/Output from previous day: 01/14 0701 - 01/15 0700 In: 1445 [P.O.:480; I.V.:715; IV Piggyback:250] Out: -   PHYSICAL EXAM General appearance: alert, cooperative, mild distress and morbidly obese Resp: rhonchi bilaterally Cardio: regular rate and rhythm, S1, S2 normal, no murmur, click, rub or gallop GI: soft, non-tender; bowel sounds normal; no masses,  no organomegaly Extremities: extremities normal, atraumatic, no cyanosis or edema  Lab Results:    Basic Metabolic Panel:  Recent Labs  01/01/14 0514  NA 145  K 4.5  CL 101  CO2 39*  GLUCOSE 121*  BUN 17  CREATININE 0.45*  CALCIUM 7.8*   Liver Function Tests: No results found for this basename: AST, ALT, ALKPHOS, BILITOT, PROT, ALBUMIN,  in the last 72 hours No results found for this basename: LIPASE, AMYLASE,  in the last 72 hours No results found for this basename: AMMONIA,  in the last 72 hours CBC:  Recent Labs  01/01/14 0514  WBC 3.7*  HGB 11.1*  HCT 37.6  MCV 98.9  PLT 126*   Cardiac Enzymes:  Recent Labs  12/31/13 0830 12/31/13 1434 12/31/13 2014  TROPONINI 1.49* 1.49* 1.29*   BNP: No results found for this basename: PROBNP,  in the last 72 hours D-Dimer: No results found for this basename: DDIMER,  in the last 72 hours CBG: No results found for this basename: GLUCAP,  in the last 72 hours Hemoglobin A1C: No results found for this basename: HGBA1C,  in the last 72 hours Fasting Lipid Panel: No results found for this basename: CHOL, HDL, LDLCALC, TRIG, CHOLHDL, LDLDIRECT,  in the last 72  hours Thyroid Function Tests: No results found for this basename: TSH, T4TOTAL, FREET4, T3FREE, THYROIDAB,  in the last 72 hours Anemia Panel: No results found for this basename: VITAMINB12, FOLATE, FERRITIN, TIBC, IRON, RETICCTPCT,  in the last 72 hours Coagulation: No results found for this basename: LABPROT, INR,  in the last 72 hours Urine Drug Screen: Drugs of Abuse  No results found for this basename: labopia, cocainscrnur, labbenz, amphetmu, thcu, labbarb    Alcohol Level: No results found for this basename: ETH,  in the last 72 hours Urinalysis: No results found for this basename: COLORURINE, APPERANCEUR, LABSPEC, PHURINE, GLUCOSEU, HGBUR, BILIRUBINUR, KETONESUR, PROTEINUR, UROBILINOGEN, NITRITE, LEUKOCYTESUR,  in the last 72 hours Misc. Labs:  ABGS No results found for this basename: PHART, PCO2, PO2ART, TCO2, HCO3,  in the last 72 hours CULTURES Recent Results (from the past 240 hour(s))  CULTURE, BLOOD (ROUTINE X 2)     Status: None   Collection Time    12/31/13  4:44 AM      Result Value Range Status   Specimen Description BLOOD LEFT HAND   Final   Special Requests BOTTLES DRAWN AEROBIC AND ANAEROBIC [place]   Final   Culture NO GROWTH 2 DAYS   Final   Report Status PENDING   Incomplete  CULTURE, BLOOD (ROUTINE X 2)     Status: None   Collection Time    12/31/13  4:49 AM      Result  Value Range Status   Specimen Description BLOOD LEFT ANTECUBITAL   Final   Special Requests BOTTLES DRAWN AEROBIC AND ANAEROBIC 6CC   Final   Culture NO GROWTH 2 DAYS   Final   Report Status PENDING   Incomplete  CULTURE, RESPIRATORY (NON-EXPECTORATED)     Status: None   Collection Time    12/31/13  2:48 PM      Result Value Range Status   Specimen Description SPUTUM EXPECTORATED   Final   Special Requests NONE   Final   Gram Stain     Final   Value: ABUNDANT WBC PRESENT, PREDOMINANTLY PMN     RARE SQUAMOUS EPITHELIAL CELLS PRESENT     RARE GRAM POSITIVE COCCI     IN PAIRS      Performed at [facility]   Culture     Final   Value: FEW STAPHYLOCOCCUS AUREUS     Note: RIFAMPIN AND GENTAMICIN SHOULD NOT BE USED AS SINGLE DRUGS FOR TREATMENT OF STAPH INFECTIONS.     Performed at [facility]   Report Status PENDING   Incomplete  MRSA PCR SCREENING     Status: None   Collection Time    12/31/13  6:44 PM      Result Value Range Status   MRSA by PCR NEGATIVE  NEGATIVE Final   Comment:            The GeneXpert MRSA Assay (FDA     approved for NASAL specimens     only), is one component of a     comprehensive MRSA colonization     surveillance program. It is not     intended to diagnose MRSA     infection nor to guide or     monitor treatment for     MRSA infections.   Studies/Results: Dg Chest Port 1 View  01/02/2014   CLINICAL DATA:  PICC line placement  EXAM: PORTABLE CHEST - 1 VIEW  COMPARISON:  Portable exam 1034 hr compared to 12/31/2013  FINDINGS: Left arm PICC line tip projects over right atrium; recommend withdrawal 4 cm.  Enlargement of cardiac silhouette with pulmonary vascular congestion.  Right basilar atelectasis versus infiltrate.  Remaining lungs clear.  No definite pleural effusion or pneumothorax.  Bones demineralized.  IMPRESSION: Persistent right basilar atelectasis versus infiltrate.  Tip of left arm PICC line projects over right atrium; recommend withdrawal 4 cm.   Electronically Signed   By: Lavonia Dana M.D.   On: 01/02/2014 10:51    Medications:  Prior to Admission:  Prescriptions prior to admission  Medication Sig Dispense Refill  . acidophilus (RISAQUAD) CAPS capsule Take 1 capsule by mouth daily.      Marland Kitchen albuterol (PROVENTIL HFA;VENTOLIN HFA) 108 (90 BASE) MCG/ACT inhaler Inhale 2 puffs into the lungs every 4 (four) hours as needed. Shortness of breath  1 Inhaler  12  . albuterol (PROVENTIL) (2.5 MG/3ML) 0.083% nebulizer solution Take 2.5 mg by nebulization 4 (four) times daily.      Marland Kitchen ALPRAZolam (XANAX) 1 MG tablet Take 1  mg by mouth 3 (three) times daily as needed for anxiety.      Marland Kitchen aspirin 325 MG tablet Take 1 tablet (325 mg total) by mouth daily.  30 tablet  12  . aspirin EC 81 MG tablet Take 324 mg by mouth once. For chest pain.      . baclofen (LIORESAL) 10 MG tablet Take 5 mg by mouth 3 (three) times daily.      Marland Kitchen  cefTRIAXone (ROCEPHIN) 1 G injection Inject 1 g into the muscle once.      . cholecalciferol (VITAMIN D) 1000 UNITS tablet Take 1,000 Units by mouth daily.      . Cranberry 450 MG CAPS Take 1 capsule by mouth daily.      Marland Kitchen guaiFENesin (MUCINEX) 600 MG 12 hr tablet Take 600 mg by mouth 2 (two) times daily.      Marland Kitchen levofloxacin (LEVAQUIN) 500 MG tablet Take 500 mg by mouth daily.      Marland Kitchen levothyroxine (SYNTHROID, LEVOTHROID) 88 MCG tablet Take 1 tablet (88 mcg total) by mouth daily before breakfast.  30 tablet  12  . loratadine (CLARITIN) 10 MG tablet Take 10 mg by mouth daily.      Marland Kitchen losartan-hydrochlorothiazide (HYZAAR) 50-12.5 MG per tablet Take 1 tablet by mouth daily.        . nitroGLYCERIN (NITROSTAT) 0.4 MG SL tablet Place 1 tablet (0.4 mg total) under the tongue every 5 (five) minutes as needed for chest pain.  25 tablet  12  . nystatin (MYCOSTATIN) powder Apply 1 g topically 2 (two) times daily.      . pantoprazole (PROTONIX) 40 MG tablet Take 1 tablet (40 mg total) by mouth daily.  30 tablet  12  . polyethylene glycol (MIRALAX / GLYCOLAX) packet Take 17 g by mouth daily.      . potassium chloride SA (K-DUR,KLOR-CON) 20 MEQ tablet Take 20 mEq by mouth daily.      . predniSONE (DELTASONE) 5 MG tablet Take 1 tablet (5 mg total) by mouth daily.  30 tablet  12  . saccharomyces boulardii (FLORASTOR) 250 MG capsule Take 250 mg by mouth 2 (two) times daily.      . sertraline (ZOLOFT) 100 MG tablet Take 1 tablet (100 mg total) by mouth daily.      . traMADol (ULTRAM) 50 MG tablet Take 50 mg by mouth every 6 (six) hours as needed for moderate pain.      Marland Kitchen zolpidem (AMBIEN) 5 MG tablet Take 1 tablet  (5 mg total) by mouth at bedtime.  30 tablet  0   Scheduled: . aspirin  325 mg Oral Daily  . atorvastatin  80 mg Oral q1800  . baclofen  5 mg Oral TID  . carbamide peroxide  5 drop Both Ears BID  . ceFEPime (MAXIPIME) IV  1 g Intravenous Q8H  . clopidogrel  75 mg Oral Q breakfast  . diphenhydrAMINE  25 mg Oral QHS  . levothyroxine  88 mcg Oral QAC breakfast  . loratadine  10 mg Oral Daily  . methylPREDNISolone (SOLU-MEDROL) injection  60 mg Intravenous Q6H  . oseltamivir  75 mg Oral BID  . pantoprazole  40 mg Oral Daily  . potassium chloride SA  20 mEq Oral BID  . saccharomyces boulardii  250 mg Oral BID  . sertraline  100 mg Oral Daily  . sodium chloride  10-40 mL Intracatheter Q12H  . sodium chloride  3 mL Intravenous Q12H  . vancomycin  1,000 mg Intravenous Q12H  . zolpidem  5 mg Oral QHS   Continuous: . sodium chloride 75 mL/hr at 01/03/14 0505   WUJ:[id number], acetaminophen, albuterol, ALPRAZolam, nitroGLYCERIN, ondansetron (ZOFRAN) IV, sodium chloride  Assesment: She was admitted with influenza A complicated by healthcare associated pneumonia and acute respiratory failure. She had a non-STEMI. She has hypertension. She appear to be septic at the time of admission but that has improved. She has cerebral palsy and I  think that is of course unchanged Principal Problem:   Acute respiratory failure Active Problems:   Cerebral palsy   Asthma   NSTEMI (non-ST elevated myocardial infarction)   HTN (hypertension)   Sepsis   Influenza A   HCAP (healthcare-associated pneumonia)    Plan: She may be able to be transferred back to her skilled care facility as early as tomorrow. She will need to have a total of 10 days of IV antibiotics    LOS: 3 days   Breanna Mcdaniel L 01/03/2014, 8:24 AM

## 2014-01-03 NOTE — Progress Notes (Signed)
ANTIBIOTIC CONSULT NOTE -   Pharmacy Consult for Vancomycin & Renal Adjustment antibiotics Indication: Sepsis, Pneumonia  Allergies  Allergen Reactions  . Naproxen Other (See Comments)    unknown   Patient Measurements: Height: 5\' 5"  (165.1 cm) Weight: 221 lb 1.9 oz (100.3 kg) IBW/kg (Calculated) : 57  Vital Signs: Temp: 98.8 F (37.1 C) (01/15 0425) Temp src: Oral (01/15 0425) BP: 172/91 mmHg (01/15 0425) Pulse Rate: 69 (01/15 0425) Intake/Output from previous day: 01/14 0701 - 01/15 0700 In: 1445 [P.O.:480; I.V.:715; IV Piggyback:250] Out: -  Intake/Output from this shift:    Labs:  Recent Labs  01/01/14 0514  WBC 3.7*  HGB 11.1*  PLT 126*  CREATININE 0.45*   Estimated Creatinine Clearance: 74.6 ml/min (by C-G formula based on Cr of 0.45). No results found for this basename: VANCOTROUGH, Corlis Leak, VANCORANDOM, GENTTROUGH, GENTPEAK, GENTRANDOM, TOBRATROUGH, TOBRAPEAK, TOBRARND, AMIKACINPEAK, AMIKACINTROU, AMIKACIN,  in the last 72 hours   Microbiology: Recent Results (from the past 720 hour(s))  CULTURE, BLOOD (ROUTINE X 2)     Status: None   Collection Time    12/31/13  4:44 AM      Result Value Range Status   Specimen Description BLOOD LEFT HAND   Final   Special Requests BOTTLES DRAWN AEROBIC AND ANAEROBIC [place]   Final   Culture NO GROWTH 2 DAYS   Final   Report Status PENDING   Incomplete  CULTURE, BLOOD (ROUTINE X 2)     Status: None   Collection Time    12/31/13  4:49 AM      Result Value Range Status   Specimen Description BLOOD LEFT ANTECUBITAL   Final   Special Requests BOTTLES DRAWN AEROBIC AND ANAEROBIC 6CC   Final   Culture NO GROWTH 2 DAYS   Final   Report Status PENDING   Incomplete  CULTURE, RESPIRATORY (NON-EXPECTORATED)     Status: None   Collection Time    12/31/13  2:48 PM      Result Value Range Status   Specimen Description SPUTUM EXPECTORATED   Final   Special Requests NONE   Final   Gram Stain     Final   Value: ABUNDANT WBC  PRESENT, PREDOMINANTLY PMN     RARE SQUAMOUS EPITHELIAL CELLS PRESENT     RARE GRAM POSITIVE COCCI     IN PAIRS     Performed at [facility]   Culture     Final   Value: Culture reincubated for better growth     Performed at [facility]   Report Status PENDING   Incomplete  MRSA PCR SCREENING     Status: None   Collection Time    12/31/13  6:44 PM      Result Value Range Status   MRSA by PCR NEGATIVE  NEGATIVE Final   Comment:            The GeneXpert MRSA Assay (FDA     approved for NASAL specimens     only), is one component of a     comprehensive MRSA colonization     surveillance program. It is not     intended to diagnose MRSA     infection nor to guide or     monitor treatment for     MRSA infections.   Medical History: Past Medical History  Diagnosis Date  . Cerebral palsy   . Asthma   . Seasonal allergies   . Thyroid disease   . HTN (  hypertension)   . Anxiety   . Arthritis   . Breast cancer     Right breast, infiltrating ductal.  . Anginal pain   . Osteoporosis 05/08/2012  . Osteoporosis 05/08/2012  . Stroke   . Angina at rest   . Chronic steroid use   . Pneumonia 09/2013  . Dysphagia   . Hypopotassemia    Medications:  Scheduled:  . aspirin  325 mg Oral Daily  . atorvastatin  80 mg Oral q1800  . baclofen  5 mg Oral TID  . carbamide peroxide  5 drop Both Ears BID  . ceFEPime (MAXIPIME) IV  1 g Intravenous Q8H  . clopidogrel  75 mg Oral Q breakfast  . diphenhydrAMINE  25 mg Oral QHS  . levothyroxine  88 mcg Oral QAC breakfast  . loratadine  10 mg Oral Daily  . methylPREDNISolone (SOLU-MEDROL) injection  60 mg Intravenous Q6H  . oseltamivir  75 mg Oral BID  . pantoprazole  40 mg Oral Daily  . potassium chloride SA  20 mEq Oral BID  . saccharomyces boulardii  250 mg Oral BID  . sertraline  100 mg Oral Daily  . sodium chloride  10-40 mL Intracatheter Q12H  . sodium chloride  3 mL Intravenous Q12H  . vancomycin  1,000 mg Intravenous  Q12H  . zolpidem  5 mg Oral QHS   Assessment: Sepsis, possibly secondary to influenza, possible HCAP Estimated Creatinine Clearance: 74.6 ml/min (by C-G formula based on Cr of 0.45).  Pt is currently afebrile.  Cefepime 1/12 >> Vancomycin 1/12 >>  Goal of Therapy:  Vancomycin trough level 15-20 mcg/ml  Plan:  Vancomycin 1 GM IV every 12 hours Vancomycin trough tomorrow am Continue Cefepime 1 GM IV every 8 hours Labs per protocol  Hart Robinsons A 01/03/2014,7:51 AM

## 2014-01-03 NOTE — Clinical Social Work Note (Signed)
Update given to [facility], anticipate discharge in 24 - 48 hours, facility willing to accept when ready.  Edwyna Shell, LCSW Clinical Social Worker [phone])

## 2014-01-03 NOTE — Progress Notes (Addendum)
Subjective:  Not feeling great today but no cardiac complaints.  Objective:  Vital Signs in the last 24 hours: Temp:  [98.8 F (37.1 C)] 98.8 F (37.1 C) (01/15 0425) Pulse Rate:  [69-80] 69 (01/15 0425) Resp:  [18-22] 20 (01/15 0425) BP: (107-172)/(61-101) 172/91 mmHg (01/15 0425) SpO2:  [96 %-99 %] 96 % (01/15 0425)  Intake/Output from previous day: 01/14 0701 - 01/15 0700 In: 1445 [P.O.:480; I.V.:715; IV Piggyback:250] Out: -  Intake/Output from this shift:    Physical Exam: NECK: Without JVD, HJR, or bruit LUNGS: Clear anterior, posterior, lateral HEART: Irregular rate and rhythm, no murmur, gallop, rub, bruit, thrill, or heave EXTREMITIES: Without cyanosis, clubbing, or edema   Lab Results:  Recent Labs  01/01/14 0514  WBC 3.7*  HGB 11.1*  PLT 126*    Recent Labs  01/01/14 0514  NA 145  K 4.5  CL 101  CO2 39*  GLUCOSE 121*  BUN 17  CREATININE 0.45*    Recent Labs  12/31/13 1434 12/31/13 2014  TROPONINI 1.49* 1.29*   Hepatic Function Panel No results found for this basename: PROT, ALBUMIN, AST, ALT, ALKPHOS, BILITOT, BILIDIR, IBILI,  in the last 72 hours No results found for this basename: CHOL,  in the last 72 hours No results found for this basename: PROTIME,  in the last 72 hours  Imaging:   Cardiac Studies:  Assessment/Plan:  NSTEMI: medical management for now. Stress myoview and cath can be done once patient recovers.Normal LV function on 2Decho and no WMA. Grade 2 diastolic dysfunction. Now on Plavix and ASA. F/u as outpaitent with Dr. Bronson Ing.  Severe COPD with exacerbation, Hypoxic respiratory failure & pneumonia. On antibiotics/steroids. Can't use beta blockers. May go home tomorrow.  Influenza A   Cerebral palsy   LOS: 3 days    Ermalinda Barrios 01/03/2014, 9:28 AM   The patient was seen and examined, and I agree with the assessment and plan as documented above, with modifications as noted below. On ASA, Plavix (for 1  month), and statin. Unable to use beta blockers due to severe COPD with exacerbation. Outpt nuclear stress test prior to coronary angiography. Would restart Hyzaar for HTN. No further recommendations. Will sign off.

## 2014-01-04 LAB — VANCOMYCIN, TROUGH: VANCOMYCIN TR: 14 ug/mL (ref 10.0–20.0)

## 2014-01-04 LAB — CULTURE, RESPIRATORY

## 2014-01-04 LAB — CULTURE, RESPIRATORY W GRAM STAIN

## 2014-01-04 MED ORDER — ATORVASTATIN CALCIUM 80 MG PO TABS: 80.0000 mg | ORAL_TABLET | Freq: Every day | ORAL | Status: DC

## 2014-01-04 MED ORDER — HEPARIN SOD (PORK) LOCK FLUSH 100 UNIT/ML IV SOLN
250.0000 [IU] | INTRAVENOUS | Status: AC | PRN
  Administered 2014-01-04: 250 [IU]
  Filled 2014-01-04: qty 5

## 2014-01-04 MED ORDER — VANCOMYCIN HCL IN DEXTROSE 1-5 GM/200ML-% IV SOLN: 1000.0000 mg | Freq: Two times a day (BID) | INTRAVENOUS | Status: DC

## 2014-01-04 MED ORDER — PREDNISONE 10 MG PO TABS: ORAL_TABLET | ORAL | Status: DC

## 2014-01-04 MED ORDER — DEXTROSE 5 % IV SOLN: 1.0000 g | Freq: Three times a day (TID) | INTRAVENOUS | Status: DC

## 2014-01-04 MED ORDER — SODIUM CHLORIDE 0.9 % IJ SOLN: 10.0000 mL | INTRAMUSCULAR | Status: DC | PRN

## 2014-01-04 MED ORDER — POTASSIUM CHLORIDE CRYS ER 20 MEQ PO TBCR: 20.0000 meq | EXTENDED_RELEASE_TABLET | Freq: Two times a day (BID) | ORAL | Status: DC

## 2014-01-04 MED ORDER — CLOPIDOGREL BISULFATE 75 MG PO TABS: 75.0000 mg | ORAL_TABLET | Freq: Every day | ORAL | Status: AC

## 2014-01-04 MED ORDER — ACETAMINOPHEN 325 MG PO TABS: 650.0000 mg | ORAL_TABLET | Freq: Four times a day (QID) | ORAL | Status: DC | PRN

## 2014-01-04 NOTE — Discharge Summary (Signed)
Physician Discharge Summary  Patient ID: [name] MRN: [id number] DOB/AGE: May 19, 1941 73 y.o. Primary Care Physician:Tesslyn Baumert L, MD Admit date: 12/31/2013 Discharge date: 01/04/2014    Discharge Diagnoses:   Principal Problem:   Acute respiratory failure Active Problems:   Cerebral palsy   Asthma   NSTEMI (non-ST elevated myocardial infarction)   HTN (hypertension)   Sepsis   Influenza A   HCAP (healthcare-associated pneumonia)     Medication List    STOP taking these medications       cefTRIAXone 1 G injection  Commonly known as:  ROCEPHIN     levofloxacin 500 MG tablet  Commonly known as:  LEVAQUIN      TAKE these medications       acetaminophen 325 MG tablet  Commonly known as:  TYLENOL  Take 2 tablets (650 mg total) by mouth every 6 (six) hours as needed for mild pain (or Fever >/= 101).     acidophilus Caps capsule  Take 1 capsule by mouth daily.     albuterol 108 (90 BASE) MCG/ACT inhaler  Commonly known as:  PROVENTIL HFA;VENTOLIN HFA  Inhale 2 puffs into the lungs every 4 (four) hours as needed. Shortness of breath     albuterol (2.5 MG/3ML) 0.083% nebulizer solution  Commonly known as:  PROVENTIL  Take 2.5 mg by nebulization 4 (four) times daily.     ALPRAZolam 1 MG tablet  Commonly known as:  XANAX  Take 1 mg by mouth 3 (three) times daily as needed for anxiety.     aspirin 325 MG tablet  Take 1 tablet (325 mg total) by mouth daily.     atorvastatin 80 MG tablet  Commonly known as:  LIPITOR  Take 1 tablet (80 mg total) by mouth daily at 6 PM.     baclofen 10 MG tablet  Commonly known as:  LIORESAL  Take 5 mg by mouth 3 (three) times daily.     cholecalciferol 1000 UNITS tablet  Commonly known as:  VITAMIN D  Take 1,000 Units by mouth daily.     clopidogrel 75 MG tablet  Commonly known as:  PLAVIX  Take 1 tablet (75 mg total) by mouth daily with breakfast.     Cranberry 450 MG Caps  Take 1 capsule by mouth daily.     dextrose 5 % SOLN 50 mL with ceFEPIme 1 G SOLR 1 g  Inject 1 g into the vein every 8 (eight) hours.     guaiFENesin 600 MG 12 hr tablet  Commonly known as:  MUCINEX  Take 600 mg by mouth 2 (two) times daily.     levothyroxine 88 MCG tablet  Commonly known as:  SYNTHROID, LEVOTHROID  Take 1 tablet (88 mcg total) by mouth daily before breakfast.     loratadine 10 MG tablet  Commonly known as:  CLARITIN  Take 10 mg by mouth daily.     losartan-hydrochlorothiazide 50-12.5 MG per tablet  Commonly known as:  HYZAAR  Take 1 tablet by mouth daily.     nitroGLYCERIN 0.4 MG SL tablet  Commonly known as:  NITROSTAT  Place 1 tablet (0.4 mg total) under the tongue every 5 (five) minutes as needed for chest pain.     nystatin powder  Commonly known as:  MYCOSTATIN  Apply 1 g topically 2 (two) times daily.     pantoprazole 40 MG tablet  Commonly known as:  PROTONIX  Take 1 tablet (40 mg total) by mouth daily.  polyethylene glycol packet  Commonly known as:  MIRALAX / GLYCOLAX  Take 17 g by mouth daily.     potassium chloride SA 20 MEQ tablet  Commonly known as:  K-DUR,KLOR-CON  Take 1 tablet (20 mEq total) by mouth 2 (two) times daily.     predniSONE 10 MG tablet  Commonly known as:  DELTASONE  Take 4 daily for 3 days, then 3 daily for 3 days, then 2 daily for 3 days, then one daily for 3 days and then one half daily indefinitely     saccharomyces boulardii 250 MG capsule  Commonly known as:  FLORASTOR  Take 250 mg by mouth 2 (two) times daily.     sertraline 100 MG tablet  Commonly known as:  ZOLOFT  Take 1 tablet (100 mg total) by mouth daily.     sodium chloride 0.9 % injection  10-40 mLs by Intracatheter route as needed (flush).     traMADol 50 MG tablet  Commonly known as:  ULTRAM  Take 50 mg by mouth every 6 (six) hours as needed for moderate pain.     vancomycin 1 GM/200ML Soln  Commonly known as:  VANCOCIN  Inject 200 mLs (1,000 mg total) into the vein every 12  (twelve) hours.     zolpidem 5 MG tablet  Commonly known as:  AMBIEN  Take 1 tablet (5 mg total) by mouth at bedtime.        Discharged Condition: Improved    Consults: Cardiology  Significant Diagnostic Studies: Dg Chest Port 1 View  01/02/2014   CLINICAL DATA:  PICC line placement  EXAM: PORTABLE CHEST - 1 VIEW  COMPARISON:  Portable exam [id number] hr compared to 12/31/2013  FINDINGS: Left arm PICC line tip projects over right atrium; recommend withdrawal 4 cm.  Enlargement of cardiac silhouette with pulmonary vascular congestion.  Right basilar atelectasis versus infiltrate.  Remaining lungs clear.  No definite pleural effusion or pneumothorax.  Bones demineralized.  IMPRESSION: Persistent right basilar atelectasis versus infiltrate.  Tip of left arm PICC line projects over right atrium; recommend withdrawal 4 cm.   Electronically Signed   By: Lavonia Dana M.D.   On: 01/02/2014 10:51   Dg Chest Port 1 View  12/31/2013   CLINICAL DATA:  Chest pain  EXAM: PORTABLE CHEST - 1 VIEW  COMPARISON:  08/09/2013  FINDINGS: Lung volumes are lower than previous. Chronic cardiomegaly. Mild pulmonary venous congestion. There is a right middle lobe and right base opacity. The lower lobe opacity is streaky in morphology. Chronic deformity of the right humeral head/neck.  IMPRESSION: Right middle and lower lobe opacities. The appearance is nonspecific, although findings favor atelectasis over pneumonia.   Electronically Signed   By: Jorje Guild M.D.   On: 12/31/2013 03:14    Lab Results: Basic Metabolic Panel: No results found for this basename: NA, K, CL, CO2, GLUCOSE, BUN, CREATININE, CALCIUM, MG, PHOS,  in the last 72 hours Liver Function Tests: No results found for this basename: AST, ALT, ALKPHOS, BILITOT, PROT, ALBUMIN,  in the last 72 hours   CBC: No results found for this basename: WBC, NEUTROABS, HGB, HCT, MCV, PLT,  in the last 72 hours  Recent Results (from the past 240 hour(s))  CULTURE,  BLOOD (ROUTINE X 2)     Status: None   Collection Time    12/31/13  4:44 AM      Result Value Range Status   Specimen Description BLOOD LEFT HAND   Final  Special Requests BOTTLES DRAWN AEROBIC AND ANAEROBIC [place]   Final   Culture NO GROWTH 4 DAYS   Final   Report Status PENDING   Incomplete  CULTURE, BLOOD (ROUTINE X 2)     Status: None   Collection Time    12/31/13  4:49 AM      Result Value Range Status   Specimen Description BLOOD LEFT ANTECUBITAL   Final   Special Requests BOTTLES DRAWN AEROBIC AND ANAEROBIC 6CC   Final   Culture NO GROWTH 4 DAYS   Final   Report Status PENDING   Incomplete  CULTURE, RESPIRATORY (NON-EXPECTORATED)     Status: None   Collection Time    12/31/13  2:48 PM      Result Value Range Status   Specimen Description SPUTUM EXPECTORATED   Final   Special Requests NONE   Final   Gram Stain     Final   Value: ABUNDANT WBC PRESENT, PREDOMINANTLY PMN     RARE SQUAMOUS EPITHELIAL CELLS PRESENT     RARE GRAM POSITIVE COCCI     IN PAIRS     Performed at [facility]   Culture     Final   Value: FEW METHICILLIN RESISTANT STAPHYLOCOCCUS AUREUS     Note: RIFAMPIN AND GENTAMICIN SHOULD NOT BE USED AS SINGLE DRUGS FOR TREATMENT OF STAPH INFECTIONS. CRITICAL RESULT CALLED TO, READ BACK BY AND VERIFIED WITH: MARY BETH H@7 :57AM ON 01/04/14 BY DANTS     Performed at [facility]   Report Status 01/04/2014 FINAL   Final   Organism ID, Bacteria METHICILLIN RESISTANT STAPHYLOCOCCUS AUREUS   Final  MRSA PCR SCREENING     Status: None   Collection Time    12/31/13  6:44 PM      Result Value Range Status   MRSA by PCR NEGATIVE  NEGATIVE Final   Comment:            The GeneXpert MRSA Assay (FDA     approved for NASAL specimens     only), is one component of a     comprehensive MRSA colonization     surveillance program. It is not     intended to diagnose MRSA     infection nor to guide or     monitor treatment for     MRSA infections.     Hospital  Course: She was admitted from a nursing home with cough and congestion. She was found to have acute respiratory failure. She was positive for influenza A and had infiltrates on chest x-ray suggesting healthcare associated pneumonia. She also had abnormalities on EKG and had elevated troponin it was felt to have had a non-STEMI. She was initially going to be transferred to [facility] in [place] but there was difficulty in obtaining an appropriate bed. She improved somewhat she was admitted here to our intensive care unit. She was started on intravenous treatments for her pneumonia and begun on heparin for non-STEMI. Cardiology consultation was obtained. She was treated with Tamiflu for influenza. She improved and was able to be transferred out of the intensive care unit. She has not had any further chest pain. She has a PICC line in place to continue IV antibiotics. She is weak but generally back to baseline. She still has some rhonchi on chest exam  Discharge Exam: Blood pressure 165/79, pulse 102, temperature 98.8 F (37.1 C), temperature source Oral, resp. rate 20, height 5\' 5"  (1.651 m), weight 100.3 kg (221  lb 1.9 oz), SpO2 96.00%. She is awake and alert. She has cerebral palsy which is chronic of course. Her chest shows some rhonchi. Her heart is regular. Her abdomen is  Disposition: She will be transferred back to the skilled care facility. PICC line will remain in place. She will receive cefepime 1 g every 8 hours for 5 more days. She will receive vancomycin 1000 mg every 12 hours with dose to be adjusted based on pharmacy at her skilled care facility for 5 more days. She will need to followup with cardiology. She will need physical therapy occupational therapy and speech therapy as needed. She will need to start on these rather slowly because of her acute MI and generalized weakness.      Discharge Orders   Future Appointments Provider Department Dept Phone   01/18/2014 10:20 AM  [place], [place] [phone]   05/24/2014 12:30 PM [place] [phone]   05/24/2014 1:00 PM [place] Provider [place] [phone]   05/24/2014 1:30 PM [place] [phone]   Future Orders Complete By Expires   Discharge to SNF when bed available  As directed       Follow-up Information   Follow up with [facility] at [place] On 01/15/2014. (at 1:10 pm)    Specialty:  Cardiology   Contact information:   [address] [place] [address] [phone]      Signed: Alonza Bogus Pager [phone]  01/04/2014, 8:16 AM

## 2014-01-04 NOTE — Progress Notes (Signed)
ANTIBIOTIC CONSULT NOTE -   Pharmacy Consult for Vancomycin & Cefepime Indication: Sepsis, Pneumonia  Allergies  Allergen Reactions  . Naproxen Other (See Comments)    unknown   Patient Measurements: Height: 5\' 5"  (165.1 cm) Weight: 221 lb 1.9 oz (100.3 kg) IBW/kg (Calculated) : 57  Vital Signs: Temp: 98.8 F (37.1 C) (01/16 0534) Temp src: Oral (01/16 0534) BP: 165/79 mmHg (01/16 0534) Pulse Rate: 102 (01/16 0534) Intake/Output from previous day: 01/15 0701 - 01/16 0700 In: 4315 [P.O.:360; I.V.:3055; IV Piggyback:900] Out: -  Intake/Output from this shift: Total I/O In: 120 [P.O.:120] Out: -   Labs: No results found for this basename: WBC, HGB, PLT, LABCREA, CREATININE,  in the last 72 hours Estimated Creatinine Clearance: 74.6 ml/min (by C-G formula based on Cr of 0.45).  Recent Labs  01/04/14 0901  VANCOTROUGH 14.0     Microbiology: Recent Results (from the past 720 hour(s))  CULTURE, BLOOD (ROUTINE X 2)     Status: None   Collection Time    12/31/13  4:44 AM      Result Value Range Status   Specimen Description BLOOD LEFT HAND   Final   Special Requests BOTTLES DRAWN AEROBIC AND ANAEROBIC [place]   Final   Culture NO GROWTH 4 DAYS   Final   Report Status PENDING   Incomplete  CULTURE, BLOOD (ROUTINE X 2)     Status: None   Collection Time    12/31/13  4:49 AM      Result Value Range Status   Specimen Description BLOOD LEFT ANTECUBITAL   Final   Special Requests BOTTLES DRAWN AEROBIC AND ANAEROBIC 6CC   Final   Culture NO GROWTH 4 DAYS   Final   Report Status PENDING   Incomplete  CULTURE, RESPIRATORY (NON-EXPECTORATED)     Status: None   Collection Time    12/31/13  2:48 PM      Result Value Range Status   Specimen Description SPUTUM EXPECTORATED   Final   Special Requests NONE   Final   Gram Stain     Final   Value: ABUNDANT WBC PRESENT, PREDOMINANTLY PMN     RARE SQUAMOUS EPITHELIAL CELLS PRESENT     RARE GRAM POSITIVE COCCI     IN PAIRS   Performed at [facility]   Culture     Final   Value: FEW METHICILLIN RESISTANT STAPHYLOCOCCUS AUREUS     Note: RIFAMPIN AND GENTAMICIN SHOULD NOT BE USED AS SINGLE DRUGS FOR TREATMENT OF STAPH INFECTIONS. CRITICAL RESULT CALLED TO, READ BACK BY AND VERIFIED WITH: MARY BETH H@7 :57AM ON 01/04/14 BY DANTS     Performed at [facility]   Report Status 01/04/2014 FINAL   Final   Organism ID, Bacteria METHICILLIN RESISTANT STAPHYLOCOCCUS AUREUS   Final  MRSA PCR SCREENING     Status: None   Collection Time    12/31/13  6:44 PM      Result Value Range Status   MRSA by PCR NEGATIVE  NEGATIVE Final   Comment:            The GeneXpert MRSA Assay (FDA     approved for NASAL specimens     only), is one component of a     comprehensive MRSA colonization     surveillance program. It is not     intended to diagnose MRSA     infection nor to guide or     monitor treatment for  MRSA infections.   Medical History: Past Medical History  Diagnosis Date  . Cerebral palsy   . Asthma   . Seasonal allergies   . Thyroid disease   . HTN (hypertension)   . Anxiety   . Arthritis   . Breast cancer     Right breast, infiltrating ductal.  . Anginal pain   . Osteoporosis 05/08/2012  . Osteoporosis 05/08/2012  . Stroke   . Angina at rest   . Chronic steroid use   . Pneumonia 09/2013  . Dysphagia   . Hypopotassemia    Medications:  Scheduled:  . aspirin  325 mg Oral Daily  . atorvastatin  80 mg Oral q1800  . baclofen  5 mg Oral TID  . carbamide peroxide  5 drop Both Ears BID  . ceFEPime (MAXIPIME) IV  1 g Intravenous Q8H  . clopidogrel  75 mg Oral Q breakfast  . diphenhydrAMINE  25 mg Oral QHS  . levothyroxine  88 mcg Oral QAC breakfast  . loratadine  10 mg Oral Daily  . methylPREDNISolone (SOLU-MEDROL) injection  60 mg Intravenous Q6H  . oseltamivir  75 mg Oral BID  . pantoprazole  40 mg Oral Daily  . potassium chloride SA  20 mEq Oral BID  . saccharomyces boulardii  250  mg Oral BID  . sertraline  100 mg Oral Daily  . sodium chloride  10-40 mL Intracatheter Q12H  . sodium chloride  3 mL Intravenous Q12H  . vancomycin  1,000 mg Intravenous Q12H  . zolpidem  5 mg Oral QHS   Assessment: 73 yo F admitted with HCAP, sepsis possible secondary to influenza.    She is clinically improved and plan to d/c home today on same antibiotic regimen to complete 10 day course.  Respiratory cx +MRSA- attempted to call Dr Luan Pulling to recommend narrowing antibiotics, however he was out of office.  Spoke with RN, Larena Glassman, who stated she did inform him of +MRSA cx data when he rounded this morning.  Vancomycin trough was at goal & renal function has been stable since admission.   Cefepime 1/12 >> Vancomycin 1/12 >>  Goal of Therapy:  Vancomycin trough level 15-20 mcg/ml  Plan:  Continue Vancomycin 1 GM IV every 12 hours Continue Cefepime 1 GM IV every 8 hours No further trough level, Scr indicated unless course of therapy changes.   Biagio Borg 01/04/2014,10:23 AM

## 2014-01-04 NOTE — Clinical Social Work Note (Signed)
Patient ready for discharge today, will transfer to [place] via [place].  Family and patient informed and agreeable.  Facility informed and agreeable, discharge summary faxed to facility.  FL2 reviewed w RN and updated as needed.  Discharge packet prepared and placed w shadow chart for transport.  CSW signing off as no further SW needs identified.  Edwyna Shell, LCSW Clinical Social Worker [phone])

## 2014-01-04 NOTE — Progress Notes (Signed)
CRITICAL VALUE ALERT  Critical value received:  MRSA positive sputum culture Date of notification:  01/04/2014  Time of notification:  0900  Critical value read back:yes  Nurse who received alert:  Gershon Cull, RN  MD notified (1st page):  Dr. Luan Pulling  Time of first page:    MD notified (2nd page):  Time of second page:  Responding MD:  Dr. Luan Pulling  Time MD responded:  On unit  Dr. Luan Pulling on unit and notified of MRSA positive sputum results.  No new orders at this time.

## 2014-01-04 NOTE — Progress Notes (Signed)
She feels okay. She has not had any chest pain. She is still coughing.  Physical examination shows a temperature 98.8 pulse 102 respirations 20 blood pressure 165/79 O2 sat 96%. Her chest is much clearer but she still has some rhonchi. Her heart is regular. Her abdomen is soft. She has no edema. Percent on nervous system exam shows changes from cerebral palsy which of course her chronic  She's ready for discharge back to her skilled care facility today

## 2014-01-05 LAB — CULTURE, BLOOD (ROUTINE X 2)
CULTURE: NO GROWTH
Culture: NO GROWTH

## 2014-01-15 ENCOUNTER — Encounter: Payer: Medicare Other | Admitting: Adult Health

## 2014-01-18 ENCOUNTER — Ambulatory Visit: Payer: Medicare Other | Admitting: Cardiovascular Disease

## 2014-01-22 ENCOUNTER — Ambulatory Visit (INDEPENDENT_AMBULATORY_CARE_PROVIDER_SITE_OTHER): Payer: Medicare Other | Admitting: Adult Health

## 2014-01-22 ENCOUNTER — Encounter: Payer: Self-pay | Admitting: Adult Health

## 2014-01-22 VITALS — BP 102/68 | HR 90 | Ht 65.0 in | Wt 204.0 lb

## 2014-01-22 DIAGNOSIS — I214 Non-ST elevation (NSTEMI) myocardial infarction: Secondary | ICD-10-CM

## 2014-01-22 DIAGNOSIS — I10 Essential (primary) hypertension: Secondary | ICD-10-CM

## 2014-01-22 NOTE — Assessment & Plan Note (Signed)
BP is low normal today she is basically sedentary, wheelchair-bound, he is working with physical therapy. She denies dizziness or near syncope when standing. She will followup in one month for close evaluation by Dr. Pennelope Bracken with possibility of this decreasing her antihypertensive, losartan/HCTZ 50/12.5. May consider discontinuing the HCTZ portion. Otherwise we will continue all other medications. I requested a UA to be completed to evaluate for UTI.

## 2014-01-22 NOTE — Assessment & Plan Note (Signed)
Related to hypoxia in the setting of flu and pneumonia. The patient is feeling much better. Doubt stress Myoview will be helpful as she will be unable to hold still under the camera. Another option would be a dobutamine stress echo. Now that she is asymptomatic, we will wait to do any further testing at this time. If symptoms return, we will consider ischemic evaluation.

## 2014-01-22 NOTE — Progress Notes (Deleted)
Name: [name]    DOB: 1941-09-19  Age: 73 y.o.  MR#: [id number]       PCP:  Alonza Bogus, MD      Insurance: Payor: MEDICARE / Plan: MEDICARE PART A AND B / Product Type: *No Product type* /   CC:    Chief Complaint  Patient presents with  . Hypertension    VS Filed Vitals:   01/22/14 1351  BP: 102/68  Pulse: 90  Height: 5\' 5"  (1.651 m)  Weight: 204 lb (92.534 kg)  SpO2: 96%    Weights Current Weight  01/22/14 204 lb (92.534 kg)  01/02/14 221 lb 1.9 oz (100.3 kg)  08/09/13 224 lb 13.9 oz (102 kg)    Blood Pressure  BP Readings from Last 3 Encounters:  01/22/14 102/68  01/04/14 165/79  11/23/13 107/67     Admit date:  (Not on file) Last encounter with RMR:  Visit date not found   Allergy Naproxen  Current Outpatient Prescriptions  Medication Sig Dispense Refill  . acetaminophen (TYLENOL) 325 MG tablet Take 2 tablets (650 mg total) by mouth every 6 (six) hours as needed for mild pain (or Fever >/= 101).      Marland Kitchen acidophilus (RISAQUAD) CAPS capsule Take 1 capsule by mouth daily.      Marland Kitchen albuterol (PROVENTIL HFA;VENTOLIN HFA) 108 (90 BASE) MCG/ACT inhaler Inhale 2 puffs into the lungs every 4 (four) hours as needed. Shortness of breath  1 Inhaler  12  . albuterol (PROVENTIL) (2.5 MG/3ML) 0.083% nebulizer solution Take 2.5 mg by nebulization 4 (four) times daily.      Marland Kitchen ALPRAZolam (XANAX) 1 MG tablet Take 1 mg by mouth 3 (three) times daily as needed for anxiety.      Marland Kitchen aspirin 325 MG tablet Take 1 tablet (325 mg total) by mouth daily.  30 tablet  12  . atorvastatin (LIPITOR) 80 MG tablet Take 1 tablet (80 mg total) by mouth daily at 6 PM.      . baclofen (LIORESAL) 10 MG tablet Take 5 mg by mouth 3 (three) times daily.      . cholecalciferol (VITAMIN D) 1000 UNITS tablet Take 1,000 Units by mouth daily.      . clopidogrel (PLAVIX) 75 MG tablet Take 1 tablet (75 mg total) by mouth daily with breakfast.      . Cranberry 450 MG CAPS Take 1 capsule by mouth daily.       Marland Kitchen dextrose 5 % SOLN 50 mL with ceFEPIme 1 G SOLR 1 g Inject 1 g into the vein every 8 (eight) hours.      Marland Kitchen guaiFENesin (MUCINEX) 600 MG 12 hr tablet Take 600 mg by mouth 2 (two) times daily.      Marland Kitchen levothyroxine (SYNTHROID, LEVOTHROID) 88 MCG tablet Take 1 tablet (88 mcg total) by mouth daily before breakfast.  30 tablet  12  . loratadine (CLARITIN) 10 MG tablet Take 10 mg by mouth daily.      Marland Kitchen losartan-hydrochlorothiazide (HYZAAR) 50-12.5 MG per tablet Take 1 tablet by mouth daily.        . nitroGLYCERIN (NITROSTAT) 0.4 MG SL tablet Place 1 tablet (0.4 mg total) under the tongue every 5 (five) minutes as needed for chest pain.  25 tablet  12  . nystatin (MYCOSTATIN) powder Apply 1 g topically 2 (two) times daily.      . pantoprazole (PROTONIX) 40 MG tablet Take 1 tablet (40 mg total) by mouth daily.  30 tablet  12  . polyethylene glycol (MIRALAX / GLYCOLAX) packet Take 17 g by mouth daily.      . potassium chloride SA (K-DUR,KLOR-CON) 20 MEQ tablet Take 1 tablet (20 mEq total) by mouth 2 (two) times daily.      . predniSONE (DELTASONE) 10 MG tablet Take 4 daily for 3 days, then 3 daily for 3 days, then 2 daily for 3 days, then one daily for 3 days and then one half daily indefinitely      . saccharomyces boulardii (FLORASTOR) 250 MG capsule Take 250 mg by mouth 2 (two) times daily.      . sertraline (ZOLOFT) 100 MG tablet Take 1 tablet (100 mg total) by mouth daily.      . sodium chloride 0.9 % injection 10-40 mLs by Intracatheter route as needed (flush).  5 mL    . traMADol (ULTRAM) 50 MG tablet Take 50 mg by mouth every 6 (six) hours as needed for moderate pain.      . vancomycin (VANCOCIN) 1 GM/200ML SOLN Inject 200 mLs (1,000 mg total) into the vein every 12 (twelve) hours.  4000 mL    . zolpidem (AMBIEN) 5 MG tablet Take 1 tablet (5 mg total) by mouth at bedtime.  30 tablet  0   No current facility-administered medications for this visit.    Discontinued Meds:   There are no  discontinued medications.  Patient Active Problem List   Diagnosis Date Noted  . Community acquired pneumonia 12/31/2013  . NSTEMI (non-ST elevated myocardial infarction) 12/31/2013  . HTN (hypertension) 12/31/2013  . Acute respiratory failure 12/31/2013  . Sepsis 12/31/2013  . Influenza A 12/31/2013  . HCAP (healthcare-associated pneumonia) 12/31/2013  . Asthma with acute exacerbation 08/09/2013  . Pneumonia 08/09/2013  . Anxiety state, unspecified 08/09/2013  . Chest pain 06/11/2013  . Wheezes 06/11/2013  . Morbid obesity 06/11/2013  . Cerebral palsy 06/11/2013  . Thrombocytopenia 06/11/2013  . Asthma 06/11/2013  . Hypertension 06/11/2013  . Chronic steroid use 06/11/2013  . Leg edema, left 06/11/2013  . Chronic respiratory failure with hypoxia 06/11/2013  . Hypothyroidism 06/11/2013  . Osteoporosis 05/08/2012  . Sciatica 02/10/2012  . DDD (degenerative disc disease), lumbar 02/10/2012  . Infiltrating ductal carcinoma of breast   . Sprain of left knee 08/19/2011  . Arthritis of knee, right 08/19/2011  . Hemarthrosis involving knee joint 07/07/2011  . DEGENERATIVE JOINT DISEASE, RIGHT KNEE 12/02/2010  . BURSITIS, KNEE 12/02/2010  . CARPAL TUNNEL SYNDROME, BILATERAL 05/21/2010  . COMPLETE RUPTURE OF ROTATOR CUFF 05/21/2010  . CLOSED FRACTURE OF DISTAL END OF ULNA 04/08/2010    LABS    Component Value Date/Time   NA 145 01/01/2014 0514   NA 146 12/31/2013 0322   NA 139 11/23/2013 1218   K 4.5 01/01/2014 0514   K 3.8 12/31/2013 0322   K 4.6 11/23/2013 1218   CL 101 01/01/2014 0514   CL 97 12/31/2013 0322   CL 91* 11/23/2013 1218   CO2 39* 01/01/2014 0514   CO2 38* 12/31/2013 0322   CO2 44* 11/23/2013 1218   GLUCOSE 121* 01/01/2014 0514   GLUCOSE 150* 12/31/2013 0322   GLUCOSE 95 11/23/2013 1218   BUN 17 01/01/2014 0514   BUN 18 12/31/2013 0322   BUN 15 11/23/2013 1218   CREATININE 0.45* 01/01/2014 0514   CREATININE 0.44* 12/31/2013 0322   CREATININE 0.47* 11/23/2013 1218    CALCIUM 7.8* 01/01/2014 0514   CALCIUM 9.1 12/31/2013 0322   CALCIUM 9.5 11/23/2013 1218  GFRNONAA >90 01/01/2014 0514   GFRNONAA >90 12/31/2013 0322   GFRNONAA >90 11/23/2013 1218   GFRAA >90 01/01/2014 0514   GFRAA >90 12/31/2013 0322   GFRAA >90 11/23/2013 1218   CMP     Component Value Date/Time   NA 145 01/01/2014 0514   K 4.5 01/01/2014 0514   CL 101 01/01/2014 0514   CO2 39* 01/01/2014 0514   GLUCOSE 121* 01/01/2014 0514   BUN 17 01/01/2014 0514   CREATININE 0.45* 01/01/2014 0514   CALCIUM 7.8* 01/01/2014 0514   PROT 6.8 11/23/2013 1218   ALBUMIN 3.7 11/23/2013 1218   AST 12 11/23/2013 1218   ALT 9 11/23/2013 1218   ALKPHOS 62 11/23/2013 1218   BILITOT 0.3 11/23/2013 1218   GFRNONAA >90 01/01/2014 0514   GFRAA >90 01/01/2014 0514       Component Value Date/Time   WBC 3.7* 01/01/2014 0514   WBC 6.5 12/31/2013 0322   WBC 6.7 11/23/2013 1218   HGB 11.1* 01/01/2014 0514   HGB 12.4 12/31/2013 0322   HGB 11.7* 11/23/2013 1218   HCT 37.6 01/01/2014 0514   HCT 36.9 12/31/2013 0322   HCT 39.5 11/23/2013 1218   MCV 98.9 01/01/2014 0514   MCV 96.6 12/31/2013 0322   MCV 99.0 11/23/2013 1218    Lipid Panel     Component Value Date/Time   CHOL 156 06/29/2012 0545   TRIG 128 06/29/2012 0545   HDL 64 06/29/2012 0545   CHOLHDL 2.4 06/29/2012 0545   VLDL 26 06/29/2012 0545   LDLCALC 66 06/29/2012 0545    ABG    Component Value Date/Time   PHART 7.323* 12/31/2013 0600   PCO2ART 73.1* 12/31/2013 0600   PO2ART 90.5 12/31/2013 0600   HCO3 36.8* 12/31/2013 0600   TCO2 33.9 12/31/2013 0600   O2SAT 96.3 12/31/2013 0600     Lab Results  Component Value Date   TSH 1.339 06/11/2013   BNP (last 3 results)  Recent Labs  06/11/13 0813 08/09/13 1147 12/31/13 0322  PROBNP 67.7 75.0 408.7*   Cardiac Panel (last 3 results) No results found for this basename: CKTOTAL, CKMB, TROPONINI, RELINDX,  in the last 72 hours  Iron/TIBC/Ferritin No results found for this basename: iron, tibc, ferritin     EKG Orders  placed during the hospital encounter of 12/31/13  . EKG 12-LEAD  . EKG 12-LEAD  . EKG  . ED EKG  . ED EKG     Prior Assessment and Plan Problem List as of 01/22/2014     Cardiovascular and Mediastinum   Hypertension   NSTEMI (non-ST elevated myocardial infarction)   HTN (hypertension)     Respiratory   Asthma   Chronic respiratory failure with hypoxia   Asthma with acute exacerbation   Pneumonia   Community acquired pneumonia   Acute respiratory failure   Influenza A   HCAP (healthcare-associated pneumonia)     Endocrine   Hypothyroidism     Nervous and Auditory   CARPAL TUNNEL SYNDROME, BILATERAL   Sciatica   Cerebral palsy     Musculoskeletal and Integument   COMPLETE RUPTURE OF ROTATOR CUFF   CLOSED FRACTURE OF DISTAL END OF ULNA   DEGENERATIVE JOINT DISEASE, RIGHT KNEE   BURSITIS, KNEE   Hemarthrosis involving knee joint   Sprain of left knee   Arthritis of knee, right   DDD (degenerative disc disease), lumbar   Osteoporosis     Hematopoietic and Hemostatic   Thrombocytopenia     Other  Infiltrating ductal carcinoma of breast   Chest pain   Wheezes   Morbid obesity   Chronic steroid use   Leg edema, left   Anxiety state, unspecified   Sepsis       Imaging: Dg Chest Port 1 View  01/02/2014   CLINICAL DATA:  PICC line placement  EXAM: PORTABLE CHEST - 1 VIEW  COMPARISON:  Portable exam 1034 hr compared to 12/31/2013  FINDINGS: Left arm PICC line tip projects over right atrium; recommend withdrawal 4 cm.  Enlargement of cardiac silhouette with pulmonary vascular congestion.  Right basilar atelectasis versus infiltrate.  Remaining lungs clear.  No definite pleural effusion or pneumothorax.  Bones demineralized.  IMPRESSION: Persistent right basilar atelectasis versus infiltrate.  Tip of left arm PICC line projects over right atrium; recommend withdrawal 4 cm.   Electronically Signed   By: Lavonia Dana M.D.   On: 01/02/2014 10:51   Dg Chest Port 1  View  12/31/2013   CLINICAL DATA:  Chest pain  EXAM: PORTABLE CHEST - 1 VIEW  COMPARISON:  08/09/2013  FINDINGS: Lung volumes are lower than previous. Chronic cardiomegaly. Mild pulmonary venous congestion. There is a right middle lobe and right base opacity. The lower lobe opacity is streaky in morphology. Chronic deformity of the right humeral head/neck.  IMPRESSION: Right middle and lower lobe opacities. The appearance is nonspecific, although findings favor atelectasis over pneumonia.   Electronically Signed   By: Jorje Guild M.D.   On: 12/31/2013 03:14

## 2014-01-22 NOTE — Patient Instructions (Signed)
Your physician recommends that you schedule a follow-up appointment in: ONE MONTH WITH Dr. Bronson Ing

## 2014-01-22 NOTE — Progress Notes (Signed)
HPI: Mrs. [name] is a 73 year old patient of Dr. Pennelope Bracken, we are following for ongoing assessment and management of hypertension. The patient was recently admitted to [facility] with influenza. She was also treated for pneumonia. She had mild elevation in troponin was diagnosed with non-ST elevation MI. This is related to hypoxia. Other history includescerebral palsy (functional with ADLs), COPD, prior CVA, hypertension, anxiety and depression, right breast cancer s/p lumpectomy, hypothyroidism, and GERD.    Of note the patient had a negative cardiac stress test in June of 2014. Echocardiogram demonstrated normal LVEF with grade 2 diastolic dysfunction. She comes today feeling much better, continues at the skilled nursing facility where she is undergoing physical therapy. She denies any recurrent chest discomfort, shortness of breath, or cough. She has stated her antibiotic course and PICC line was removed.    She has some complaints of dysuria. Caregiver who is with her states she has chronic UTIs.  Allergies  Allergen Reactions  . Naproxen Other (See Comments)    unknown    Current Outpatient Prescriptions  Medication Sig Dispense Refill  . acetaminophen (TYLENOL) 325 MG tablet Take 2 tablets (650 mg total) by mouth every 6 (six) hours as needed for mild pain (or Fever >/= 101).      Marland Kitchen acidophilus (RISAQUAD) CAPS capsule Take 1 capsule by mouth daily.      Marland Kitchen albuterol (PROVENTIL HFA;VENTOLIN HFA) 108 (90 BASE) MCG/ACT inhaler Inhale 2 puffs into the lungs every 4 (four) hours as needed. Shortness of breath  1 Inhaler  12  . albuterol (PROVENTIL) (2.5 MG/3ML) 0.083% nebulizer solution Take 2.5 mg by nebulization 4 (four) times daily.      Marland Kitchen ALPRAZolam (XANAX) 1 MG tablet Take 1 mg by mouth 3 (three) times daily as needed for anxiety.      Marland Kitchen aspirin 325 MG tablet Take 1 tablet (325 mg total) by mouth daily.  30 tablet  12  . atorvastatin (LIPITOR) 80 MG tablet Take 1 tablet (80 mg  total) by mouth daily at 6 PM.      . baclofen (LIORESAL) 10 MG tablet Take 5 mg by mouth 3 (three) times daily.      . cholecalciferol (VITAMIN D) 1000 UNITS tablet Take 1,000 Units by mouth daily.      . clopidogrel (PLAVIX) 75 MG tablet Take 1 tablet (75 mg total) by mouth daily with breakfast.      . Cranberry 450 MG CAPS Take 1 capsule by mouth daily.      Marland Kitchen dextrose 5 % SOLN 50 mL with ceFEPIme 1 G SOLR 1 g Inject 1 g into the vein every 8 (eight) hours.      Marland Kitchen guaiFENesin (MUCINEX) 600 MG 12 hr tablet Take 600 mg by mouth 2 (two) times daily.      Marland Kitchen levothyroxine (SYNTHROID, LEVOTHROID) 88 MCG tablet Take 1 tablet (88 mcg total) by mouth daily before breakfast.  30 tablet  12  . loratadine (CLARITIN) 10 MG tablet Take 10 mg by mouth daily.      Marland Kitchen losartan-hydrochlorothiazide (HYZAAR) 50-12.5 MG per tablet Take 1 tablet by mouth daily.        . nitroGLYCERIN (NITROSTAT) 0.4 MG SL tablet Place 1 tablet (0.4 mg total) under the tongue every 5 (five) minutes as needed for chest pain.  25 tablet  12  . nystatin (MYCOSTATIN) powder Apply 1 g topically 2 (two) times daily.      . pantoprazole (PROTONIX) 40 MG  tablet Take 1 tablet (40 mg total) by mouth daily.  30 tablet  12  . polyethylene glycol (MIRALAX / GLYCOLAX) packet Take 17 g by mouth daily.      . potassium chloride SA (K-DUR,KLOR-CON) 20 MEQ tablet Take 1 tablet (20 mEq total) by mouth 2 (two) times daily.      . predniSONE (DELTASONE) 10 MG tablet Take 4 daily for 3 days, then 3 daily for 3 days, then 2 daily for 3 days, then one daily for 3 days and then one half daily indefinitely      . saccharomyces boulardii (FLORASTOR) 250 MG capsule Take 250 mg by mouth 2 (two) times daily.      . sertraline (ZOLOFT) 100 MG tablet Take 1 tablet (100 mg total) by mouth daily.      . sodium chloride 0.9 % injection 10-40 mLs by Intracatheter route as needed (flush).  5 mL    . traMADol (ULTRAM) 50 MG tablet Take 50 mg by mouth every 6 (six) hours  as needed for moderate pain.      . vancomycin (VANCOCIN) 1 GM/200ML SOLN Inject 200 mLs (1,000 mg total) into the vein every 12 (twelve) hours.  4000 mL    . zolpidem (AMBIEN) 5 MG tablet Take 1 tablet (5 mg total) by mouth at bedtime.  30 tablet  0   No current facility-administered medications for this visit.    Past Medical History  Diagnosis Date  . Cerebral palsy   . Asthma   . Seasonal allergies   . Thyroid disease   . HTN (hypertension)   . Anxiety   . Arthritis   . Breast cancer     Right breast, infiltrating ductal.  . Anginal pain   . Osteoporosis 05/08/2012  . Osteoporosis 05/08/2012  . Stroke   . Angina at rest   . Chronic steroid use   . Pneumonia 09/2013  . Dysphagia   . Hypopotassemia     Past Surgical History  Procedure Laterality Date  . Breast lumpectomy    . Radical abdominal hysterectomy    . Dental extraction    . Right hand surgery      WUJ:[id number] of systems complete and found to be negative unless listed above  PHYSICAL EXAM BP 102/68  Pulse 90  Ht 5\' 5"  (1.651 m)  Wt 204 lb (92.534 kg)  BMI 33.95 kg/m2  SpO2 96%  General: Well developed, well nourished, in no acute distress Head: Eyes PERRLA, No xanthomas.   Normal cephalic and atramatic  Lungs: Clear bilaterally to auscultation and percussion. Heart: HRRR S1 S2, tachycardic without MRG.  Pulses are 2+ & equal.            No carotid bruit. No JVD.  No abdominal bruits. No femoral bruits. Abdomen: Bowel sounds are positive, abdomen soft and non-tender without masses or                  Hernia's noted. Msk:  Back normal, normal gait. Normal strength and tone for age. Extremities: No clubbing, cyanosis or edema.  DP +1 Neuro: Alert and oriented X 3. Cerebral palsy musculoskeletal incoordination Psych:  Good affect, responds appropriately    ASSESSMENT AND PLAN

## 2014-02-19 ENCOUNTER — Other Ambulatory Visit (HOSPITAL_COMMUNITY): Payer: Self-pay | Admitting: Urology

## 2014-02-19 DIAGNOSIS — R310 Gross hematuria: Secondary | ICD-10-CM

## 2014-03-01 ENCOUNTER — Ambulatory Visit (INDEPENDENT_AMBULATORY_CARE_PROVIDER_SITE_OTHER): Payer: Medicare Other | Admitting: Cardiovascular Disease

## 2014-03-01 VITALS — BP 99/33 | HR 82 | Ht 64.0 in | Wt 214.0 lb

## 2014-03-01 DIAGNOSIS — I214 Non-ST elevation (NSTEMI) myocardial infarction: Secondary | ICD-10-CM

## 2014-03-01 DIAGNOSIS — I959 Hypotension, unspecified: Secondary | ICD-10-CM

## 2014-03-01 DIAGNOSIS — R079 Chest pain, unspecified: Secondary | ICD-10-CM

## 2014-03-01 DIAGNOSIS — I10 Essential (primary) hypertension: Secondary | ICD-10-CM

## 2014-03-01 MED ORDER — LOSARTAN POTASSIUM 25 MG PO TABS: 25.0000 mg | ORAL_TABLET | Freq: Every day | ORAL | Status: DC

## 2014-03-01 NOTE — Patient Instructions (Signed)
Your physician recommends that you schedule a follow-up appointment in: 3 months   Your physician has recommended you make the following change in your medication:    STOP Hyzaar, CHANGE to Losartan 25 mg daily    Thank you for choosing [facility] !

## 2014-03-01 NOTE — Progress Notes (Signed)
Patient ID: [name], female   DOB: Jul 31, 1941, 73 y.o.   MRN: [id number]      SUBJECTIVE: The patient is a 73 year old woman who I evaluated during her hospitalization for a non-STEMI superimposed on acute respiratory failure secondary to influenza A and a healthcare associated pneumonia. She also has COPD, prior CVA, cerebral palsy and hypertension. She had a negative pharmacologic stress nuclear myocardial study in June 2014. She had one episode of chest pain last week relieved with one SL nitro. Her sister tells me she had a recurrence of pneumonia for which she is currently receiving antimicrobial therapy.      Allergies  Allergen Reactions  . Naproxen Other (See Comments)    unknown    Current Outpatient Prescriptions  Medication Sig Dispense Refill  . acetaminophen (TYLENOL) 325 MG tablet Take 2 tablets (650 mg total) by mouth every 6 (six) hours as needed for mild pain (or Fever >/= 101).      Marland Kitchen acidophilus (RISAQUAD) CAPS capsule Take 1 capsule by mouth daily.      Marland Kitchen albuterol (PROVENTIL HFA;VENTOLIN HFA) 108 (90 BASE) MCG/ACT inhaler Inhale 2 puffs into the lungs every 4 (four) hours as needed. Shortness of breath  1 Inhaler  12  . albuterol (PROVENTIL) (2.5 MG/3ML) 0.083% nebulizer solution Take 2.5 mg by nebulization 4 (four) times daily.      Marland Kitchen ALPRAZolam (XANAX) 1 MG tablet Take 1 mg by mouth 3 (three) times daily as needed for anxiety.      Marland Kitchen aspirin 325 MG tablet Take 1 tablet (325 mg total) by mouth daily.  30 tablet  12  . atorvastatin (LIPITOR) 80 MG tablet Take 1 tablet (80 mg total) by mouth daily at 6 PM.      . baclofen (LIORESAL) 10 MG tablet Take 5 mg by mouth 3 (three) times daily.      . cholecalciferol (VITAMIN D) 1000 UNITS tablet Take 1,000 Units by mouth daily.      . clopidogrel (PLAVIX) 75 MG tablet Take 1 tablet (75 mg total) by mouth daily with breakfast.      . Cranberry 450 MG CAPS Take 1 capsule by mouth daily.      Marland Kitchen dextrose 5 % SOLN 50 mL  with ceFEPIme 1 G SOLR 1 g Inject 1 g into the vein every 8 (eight) hours.      Marland Kitchen guaiFENesin (MUCINEX) 600 MG 12 hr tablet Take 600 mg by mouth 2 (two) times daily.      Marland Kitchen levothyroxine (SYNTHROID, LEVOTHROID) 88 MCG tablet Take 1 tablet (88 mcg total) by mouth daily before breakfast.  30 tablet  12  . loratadine (CLARITIN) 10 MG tablet Take 10 mg by mouth daily.      Marland Kitchen losartan-hydrochlorothiazide (HYZAAR) 50-12.5 MG per tablet Take 1 tablet by mouth daily.        . nitroGLYCERIN (NITROSTAT) 0.4 MG SL tablet Place 1 tablet (0.4 mg total) under the tongue every 5 (five) minutes as needed for chest pain.  25 tablet  12  . nystatin (MYCOSTATIN) powder Apply 1 g topically 2 (two) times daily.      . pantoprazole (PROTONIX) 40 MG tablet Take 1 tablet (40 mg total) by mouth daily.  30 tablet  12  . polyethylene glycol (MIRALAX / GLYCOLAX) packet Take 17 g by mouth daily.      . potassium chloride SA (K-DUR,KLOR-CON) 20 MEQ tablet Take 1 tablet (20 mEq total) by mouth 2 (two) times daily.      Marland Kitchen  predniSONE (DELTASONE) 10 MG tablet Take 4 daily for 3 days, then 3 daily for 3 days, then 2 daily for 3 days, then one daily for 3 days and then one half daily indefinitely      . saccharomyces boulardii (FLORASTOR) 250 MG capsule Take 250 mg by mouth 2 (two) times daily.      . sertraline (ZOLOFT) 100 MG tablet Take 1 tablet (100 mg total) by mouth daily.      . sodium chloride 0.9 % injection 10-40 mLs by Intracatheter route as needed (flush).  5 mL    . traMADol (ULTRAM) 50 MG tablet Take 50 mg by mouth every 6 (six) hours as needed for moderate pain.      . vancomycin (VANCOCIN) 1 GM/200ML SOLN Inject 200 mLs (1,000 mg total) into the vein every 12 (twelve) hours.  4000 mL    . zolpidem (AMBIEN) 5 MG tablet Take 1 tablet (5 mg total) by mouth at bedtime.  30 tablet  0   No current facility-administered medications for this visit.    Past Medical History  Diagnosis Date  . Cerebral palsy   . Asthma     . Seasonal allergies   . Thyroid disease   . HTN (hypertension)   . Anxiety   . Arthritis   . Breast cancer     Right breast, infiltrating ductal.  . Anginal pain   . Osteoporosis 05/08/2012  . Osteoporosis 05/08/2012  . Stroke   . Angina at rest   . Chronic steroid use   . Pneumonia 09/2013  . Dysphagia   . Hypopotassemia     Past Surgical History  Procedure Laterality Date  . Breast lumpectomy    . Radical abdominal hysterectomy    . Dental extraction    . Right hand surgery      History   Social History  . Marital Status: Single    Spouse Name: N/A    Number of Children: N/A  . Years of Education: college   Occupational History  . disabled    Social History Main Topics  . Smoking status: Never Smoker   . Smokeless tobacco: Never Used  . Alcohol Use: No  . Drug Use: No  . Sexual Activity: No   Other Topics Concern  . Not on file   Social History Narrative  . No narrative on file    BP 99/33  Pulse 82    PHYSICAL EXAM General: NAD Neck: No JVD, no thyromegaly. Lungs: Diminished at bases bilaterally with adequate respiratory effort. CV: Nondisplaced PMI.  Regular rate and rhythm, normal S1/S2, no S3/S4, no murmur. No pretibial or periankle edema.  No carotid bruit.  Normal pedal pulses.  Abdomen: Soft, nontender, no hepatosplenomegaly, no distention.  Neurologic: Alert and oriented x 3.  Psych: Normal affect. Extremities: No clubbing or cyanosis.   ECG: reviewed and available in electronic records.      ASSESSMENT AND PLAN: 1. Prior NSTEMI in the setting of acute respiratory failure secondary to influenza A and a healthcare associated pneumonia: prior nuclear MPI study in 05/2013 was normal. Currently asymptomatic with only one episode of chest pain last week relieved with 1 SL nitro Will continue medical therapy. If she were to have recurrent bouts of chest pain not controlled with adequate medical therapy, I would consider repeat stress testing  vs coronary angiography. 2. Hypotension: will switch Hyzaar to losartan 25 mg daily.  Dispo: f/u 3 months.  Kate Sable, M.D., F.A.C.C.

## 2014-03-13 ENCOUNTER — Encounter (HOSPITAL_COMMUNITY): Payer: Self-pay

## 2014-03-13 ENCOUNTER — Ambulatory Visit (HOSPITAL_COMMUNITY)
Admission: RE | Admit: 2014-03-13 | Discharge: 2014-03-13 | Disposition: A | Discharge status: Home / Self Care | Payer: Medicare Other | Source: Ambulatory Visit | Attending: Urology | Admitting: Urology

## 2014-03-13 DIAGNOSIS — R310 Gross hematuria: Secondary | ICD-10-CM

## 2014-03-13 DIAGNOSIS — Z853 Personal history of malignant neoplasm of breast: Secondary | ICD-10-CM | POA: Insufficient documentation

## 2014-03-13 DIAGNOSIS — J9819 Other pulmonary collapse: Secondary | ICD-10-CM | POA: Insufficient documentation

## 2014-03-13 DIAGNOSIS — M47817 Spondylosis without myelopathy or radiculopathy, lumbosacral region: Secondary | ICD-10-CM | POA: Insufficient documentation

## 2014-03-13 DIAGNOSIS — Q6101 Congenital single renal cyst: Secondary | ICD-10-CM | POA: Insufficient documentation

## 2014-03-13 DIAGNOSIS — D1809 Hemangioma of other sites: Secondary | ICD-10-CM | POA: Insufficient documentation

## 2014-03-13 DIAGNOSIS — I77811 Abdominal aortic ectasia: Secondary | ICD-10-CM | POA: Insufficient documentation

## 2014-03-13 DIAGNOSIS — I700 Atherosclerosis of aorta: Secondary | ICD-10-CM | POA: Insufficient documentation

## 2014-03-13 DIAGNOSIS — N134 Hydroureter: Secondary | ICD-10-CM | POA: Insufficient documentation

## 2014-03-13 DIAGNOSIS — N200 Calculus of kidney: Secondary | ICD-10-CM | POA: Insufficient documentation

## 2014-03-13 LAB — BUN: BUN: 11 mg/dL (ref 6–23)

## 2014-03-13 LAB — CREATININE, SERUM
Creatinine, Ser: 0.48 mg/dL — ABNORMAL LOW (ref 0.50–1.10)
GFR calc Af Amer: >90 mL/min (ref >90)
GFR calc non Af Amer: >90 mL/min (ref >90)

## 2014-03-13 MED ORDER — IOHEXOL 300 MG/ML  SOLN
125.0000 mL | Freq: Once | INTRAMUSCULAR | Status: AC | PRN
  Administered 2014-03-13: 125 mL via INTRAVENOUS

## 2014-03-15 ENCOUNTER — Other Ambulatory Visit (HOSPITAL_COMMUNITY): Payer: Self-pay | Admitting: Internal Medicine

## 2014-03-15 DIAGNOSIS — R1310 Dysphagia, unspecified: Secondary | ICD-10-CM

## 2014-03-18 ENCOUNTER — Ambulatory Visit (HOSPITAL_COMMUNITY)
Admission: RE | Admit: 2014-03-18 | Discharge: 2014-03-18 | Disposition: A | Discharge status: Home / Self Care | Payer: Medicare Other | Source: Ambulatory Visit | Attending: Internal Medicine | Admitting: Internal Medicine

## 2014-03-18 ENCOUNTER — Ambulatory Visit (HOSPITAL_COMMUNITY)
Admission: RE | Admit: 2014-03-18 | Discharge: 2014-03-18 | Disposition: A | Discharge status: Home / Self Care | Payer: Medicare Other | Source: Ambulatory Visit | Attending: Pulmonary Disease | Admitting: Pulmonary Disease

## 2014-03-18 DIAGNOSIS — R1310 Dysphagia, unspecified: Secondary | ICD-10-CM

## 2014-03-18 DIAGNOSIS — IMO0001 Reserved for inherently not codable concepts without codable children: Secondary | ICD-10-CM | POA: Insufficient documentation

## 2014-03-18 DIAGNOSIS — I10 Essential (primary) hypertension: Secondary | ICD-10-CM | POA: Insufficient documentation

## 2014-03-18 DIAGNOSIS — R1311 Dysphagia, oral phase: Secondary | ICD-10-CM | POA: Insufficient documentation

## 2014-03-18 NOTE — Procedures (Signed)
Objective Swallowing Evaluation: Modified Barium Swallowing Study  Patient Details  Name: [name] MRN: [id number] Date of Birth: 1941/10/15  Today's Date: 03/18/2014 Time: 10:40 AM  - 11:17 AM    Past Medical History:  Past Medical History  Diagnosis Date  . Cerebral palsy   . Asthma   . Seasonal allergies   . Thyroid disease   . HTN (hypertension)   . Anxiety   . Arthritis   . Breast cancer     Right breast, infiltrating ductal.  . Anginal pain   . Osteoporosis 05/08/2012  . Osteoporosis 05/08/2012  . Stroke   . Angina at rest   . Chronic steroid use   . Pneumonia 09/2013  . Dysphagia   . Hypopotassemia    Past Surgical History:  Past Surgical History  Procedure Laterality Date  . Breast lumpectomy    . Radical abdominal hysterectomy    . Dental extraction    . Right hand surgery     HPI:  [name] is a 73 year old woman with history of hypertension, cerebral palsy, CVA, and recurrent respiratory failure who resides at [facility] and was referred for MBSS by Dr. Luan [name] due to reports of patient couging during meals. She also has had several bouts of pneumonia per sister who accompanied [name] to the evaluation. [name] reports that she has difficulty swallowing bread, but otherwise has no complaints.  Symptoms/Limitations Symptoms: Increased coughing during meals Special Tests: MBSS  Recommendation/Prognosis  Clinical Impression:   Dysphagia Diagnosis: Mild oral phase dysphagia;Moderate oral phase dysphagia;Mild pharyngeal phase dysphagia  Clinical impression: Mild/mod oral phase dysphagia and mild pharyngeal phase dysphagia characterized by decreased oral control of bolus negatively impacted by cerebral palsy/decreased coordination, impaired oral prep (rapid transit with little manipulation of bolus), and premature spillage over base of tongue to valleculae and pyriforms with thins and nectars (delay more pronounced with nectars and also increased  residuals noted with nectars), resulting in flash penetration of thins and residuals post swallow with liquids. Pt generally clears residuals in pharynx with spontaneous second swallow, however occasionally needs reminders to repeat swallow.  Penetration was silent. No aspiration was observed, however it should be noted that it was extremely difficult to visualize glottic and subglottic area due to pt's body habitus and left shoulder. Pt was challenged with large quantities of thins and no couging or aspiration observed. Pt is at risk for aspiration due to poor oral control, premature spillage, and post swallow residuals. Her risk can be minimized by always sitting upright for all po intake and implementing dry swallows after each sip of liquid. Study reviewed with pt and sister. Will fax report to Dr. Luan [name] and [name].    Swallow Evaluation Recommendations:  Diet Recommendations: Dysphagia 2 (Fine chop);Thin liquid Liquid Administration via: Cup;Straw Medication Administration: Whole meds with liquid Supervision: Staff to assist with self feeding Compensations: Slow rate;Multiple dry swallows after each bite/sip Postural Changes and/or Swallow Maneuvers: Out of bed for meals;Seated upright 90 degrees;Upright 30-60 min after meal Oral Care Recommendations: Oral care BID Other Recommendations: Clarify dietary restrictions Follow up Recommendations: Skilled Nursing facility    Prognosis:  Prognosis for Safe Diet Advancement: Fair   Individuals Consulted: Consulted and Agree with Results and Recommendations: Patient;Family member/caregiver Family Member Consulted: sister Report Sent to : Facility (Comment);Referring physician      SLP Assessment/Plan  General: Date of Onset: 03/13/14 Type of Study: Modified Barium Swallowing Study Reason for Referral: Objectively evaluate swallowing function Previous Swallow  Assessment: BSE January 2015; puree and thin Diet Prior to this Study: Dysphagia  3 (soft);Thin liquids Temperature Spikes Noted: No Respiratory Status: Room air (pt typically wears oxygen at night and some during day) History of Recent Intubation: No Behavior/Cognition: Alert;Cooperative;Pleasant mood Oral Cavity - Dentition: Edentulous Oral Motor / Sensory Function: Impaired - see Bedside swallow eval;Impaired motor Oral impairment: Right lingual;Left lingual;Right labial;Left labial;Other (Comment) (pt has cerebral palsy, decreased coordination, spasticity) Self-Feeding Abilities: Needs assist Patient Positioning: Upright in chair Baseline Vocal Quality: Clear Volitional Cough: Weak Volitional Swallow: Able to elicit Anatomy: Within functional limits Pharyngeal Secretions: Not observed secondary MBS   Reason for Referral:   Objectively evaluate swallowing function    Oral Phase: Oral Preparation/Oral Phase Oral Phase: Impaired Oral - Nectar Oral - Nectar Straw: Weak lingual manipulation;Lingual/palatal residue (initially had difficulty with suck) Oral - Thin Oral - Thin Cup: Weak lingual manipulation;Other (Comment) (rapid oral transit) Oral - Thin Straw: Within functional limits Oral - Solids Oral - Puree: Other (Comment) (rapid oral transit, very little prep) Oral - Mechanical Soft: Piecemeal swallowing;Other (Comment) (rapid oral transit for 1st part of bolus, decreased mastication) Oral - Pill: Within functional limits Oral Phase - Comment Oral Phase - Comment: Oral transit was always rapid with decreased oral prep   Pharyngeal Phase:  Pharyngeal Phase Pharyngeal Phase: Impaired Pharyngeal - Nectar Pharyngeal - Nectar Straw: Delayed swallow initiation;Premature spillage to valleculae;Premature spillage to pyriform sinuses;Reduced epiglottic inversion;Pharyngeal residue - pyriform sinuses;Lateral channel residue Pharyngeal - Thin Pharyngeal - Thin Cup: Premature spillage to valleculae;Premature spillage to pyriform sinuses;Reduced airway/laryngeal  closure;Lateral channel residue;Penetration/Aspiration during swallow Penetration/Aspiration details (thin cup): Material does not enter airway;Material enters airway, remains ABOVE vocal cords then ejected out Pharyngeal - Thin Straw: Premature spillage to valleculae;Premature spillage to pyriform sinuses;Reduced airway/laryngeal closure;Lateral channel residue;Penetration/Aspiration during swallow Pharyngeal - Solids Pharyngeal - Puree: Premature spillage to valleculae Pharyngeal - Mechanical Soft: Within functional limits Pharyngeal - Pill: Within functional limits Pharyngeal Phase - Comment Pharyngeal Comment: Difficult to visually assess due to pt's left shoulder desptite repositioning   Cervical Esophageal Phase  Cervical Esophageal Phase Cervical Esophageal Phase: WFL (esophageal sweep was unremarkable)   GN  Functional Assessment Tool Used: MBSS Functional Limitations: Swallowing Swallow Current Status (X5400): At least 20 percent but less than 40 percent impaired, limited or restricted Swallow Goal Status [phone]): At least 20 percent but less than 40 percent impaired, limited or restricted Swallow Discharge Status [phone]): At least 20 percent but less than 40 percent impaired, limited or restricted      Thank you,  Genene Churn, [place]  [place] 03/18/2014, 12:08 PM

## 2014-05-21 NOTE — Progress Notes (Signed)
-  No showBaird Cancer 05/24/2014

## 2014-05-24 ENCOUNTER — Ambulatory Visit (HOSPITAL_COMMUNITY): Payer: Medicare Other

## 2014-05-24 ENCOUNTER — Ambulatory Visit (HOSPITAL_COMMUNITY): Payer: Medicare Other | Admitting: Oncology

## 2014-05-24 ENCOUNTER — Other Ambulatory Visit (HOSPITAL_COMMUNITY): Payer: Medicare Other

## 2014-05-27 ENCOUNTER — Ambulatory Visit (HOSPITAL_COMMUNITY)
Admission: RE | Admit: 2014-05-27 | Discharge: 2014-05-27 | Disposition: A | Discharge status: Home / Self Care | Payer: Medicare Other | Source: Ambulatory Visit | Attending: Internal Medicine | Admitting: Internal Medicine

## 2014-05-27 ENCOUNTER — Encounter (HOSPITAL_COMMUNITY): Payer: Medicare Other

## 2014-05-27 DIAGNOSIS — J189 Pneumonia, unspecified organism: Secondary | ICD-10-CM | POA: Diagnosis present

## 2014-05-27 MED ORDER — SODIUM CHLORIDE 0.9 % IJ SOLN: 10.0000 mL | Freq: Two times a day (BID) | INTRAMUSCULAR | Status: DC

## 2014-05-27 MED ORDER — SODIUM CHLORIDE 0.9 % IJ SOLN: 10.0000 mL | INTRAMUSCULAR | Status: DC | PRN

## 2014-05-27 NOTE — Progress Notes (Signed)
Peripherally Inserted Central Catheter/Midline Placement  The IV Nurse has discussed with the patient and/or persons authorized to consent for the patient, the purpose of this procedure and the potential benefits and risks involved with this procedure.  The benefits include less needle sticks, lab draws from the catheter and patient may be discharged home with the catheter.  Risks include, but not limited to, infection, bleeding, blood clot (thrombus formation), and puncture of an artery; nerve damage and irregular heat beat.  Alternatives to this procedure were also discussed.  PICC/Midline Placement Documentation  PICC / Midline Single Lumen 06/14/93 PICC Left Basilic 49 cm 0 cm (Active)  Indication for Insertion or Continuance of Line Administration of hyperosmolar/irritating solutions (i.e. TPN, Vancomycin, etc.);Prolonged intravenous therapies;Limited venous access - need for IV therapy >5 days (PICC only) 05/27/2014 12:25 PM  Exposed Catheter (cm) 0 cm 05/27/2014 12:25 PM  Site Assessment Clean;Dry;Intact 05/27/2014 12:25 PM  Line Status Flushed;Saline locked;Capped (central line);Blood return noted 05/27/2014 12:25 PM  Dressing Type Transparent 05/27/2014 12:25 PM  Dressing Status Clean;Dry;Intact 05/27/2014 12:25 PM     PICC / Midline Double Lumen 85/46/27 PICC Left Basilic 44 cm 1 cm (Active)   Patient here for picc placement for lower lobe bilateral pneumonia . Placed in left arm due to patient having lump removed from right breast. Patient tolerate well.     Roselind Messier 05/27/2014, 12:42 PM

## 2014-05-27 NOTE — Discharge Instructions (Signed)
PICC Home Guide A peripherally inserted central catheter (PICC) is a long, thin, flexible tube that is inserted into a vein in the upper arm. It is a form of intravenous (IV) access. It is considered to be a "central" line because the tip of the PICC ends in a large vein in your chest. This large vein is called the superior vena cava (SVC). The PICC tip ends in the SVC because there is a lot of blood flow in the SVC. This allows medicines and IV fluids to be quickly distributed throughout the body. The PICC is inserted using a sterile technique by a specially trained nurse or physician. After the PICC is inserted, a chest X-ray exam is done to be sure it is in the correct place.  A PICC may be placed for different reasons, such as:  To give medicines and liquid nutrition that can only be given through a central line. Examples are:  Certain antibiotic treatments.  Chemotherapy.  Total parenteral nutrition (TPN).  To take frequent blood samples.  To give IV fluids and blood products.  If there is difficulty placing a peripheral intravenous (PIV) catheter. If taken care of properly, a PICC can remain in place for several months. A PICC can also allow a person to go home from the hospital early. Medicine and PICC care can be managed at home by a family member or home health care team. WHAT PROBLEMS CAN HAPPEN WHEN I HAVE A PICC? Problems with a PICC can occasionally occur. These may include:  A blood clot (thrombus) forming in or at the tip of the PICC. This can cause the PICC to become clogged. A clot-dissolving medicine called tissue plasminogen activator (tPA) can be given through the PICC to help break up the clot.  Inflammation of the vein (phlebitis) in which the PICC is placed. Signs of inflammation may include redness, pain at the insertion site, red streaks, or being able to feel a "cord" in the vein where the PICC is located.  Infection in the PICC or at the insertion site. Signs of  infection may include fever, chills, redness, swelling, or pus drainage from the PICC insertion site.  PICC movement (malposition). The PICC tip may move from its original position due to excessive physical activity, forceful coughing, sneezing, or vomiting.  A break or cut in the PICC. It is important to not use scissors near the PICC.  Nerve or tendon irritation or injury during PICC insertion. WHAT SHOULD I KEEP IN MIND ABOUT ACTIVITIES WHEN I HAVE A PICC?  You may bend your arm and move it freely. If your PICC is near or at the bend of your elbow, avoid activity with repeated motion at the elbow.  Rest at home for the remainder of the day following PICC line insertion.  Avoid lifting heavy objects as instructed by your health care provider.  Avoid using a crutch with the arm on the same side as your PICC. You may need to use a walker. WHAT SHOULD I KNOW ABOUT MY PICC DRESSING?  Keep your PICC bandage (dressing) clean and dry to prevent infection.  Ask your health care provider when you may shower. Ask your health care provider to teach you how to wrap the PICC when you do take a shower.  Change the PICC dressing as instructed by your health care provider.  Change your PICC dressing if it becomes loose or wet. WHAT SHOULD I KNOW ABOUT PICC CARE?  Check the PICC insertion site daily for   leakage, redness, swelling, or pain.  Do not take a bath, swim, or use hot tubs when you have a PICC. Cover PICC line with clear plastic wrap and tape to keep it dry while showering.  Flush the PICC as directed by your health care provider. Let your health care provider know right away if the PICC is difficult to flush or does not flush. Do not use force to flush the PICC.  Do not use a syringe that is less than 10 mL to flush the PICC.  Never pull or tug on the PICC.  Avoid blood pressure checks on the arm with the PICC.  Keep your PICC identification card with you at all times.  Do not  take the PICC out yourself. Only a trained clinical professional should remove the PICC. SEEK IMMEDIATE MEDICAL CARE IF:  Your PICC is accidently pulled all the way out. If this happens, cover the insertion site with a bandage or gauze dressing. Do not throw the PICC away. Your health care provider will need to inspect it.  Your PICC was tugged or pulled and has partially come out. Do not  push the PICC back in.  There is any type of drainage, redness, or swelling where the PICC enters the skin.  You cannot flush the PICC, it is difficult to flush, or the PICC leaks around the insertion site when it is flushed.  You hear a "flushing" sound when the PICC is flushed.  You have pain, discomfort, or numbness in your arm, shoulder, or jaw on the same side as the PICC.  You feel your heart "racing" or skipping beats.  You notice a hole or tear in the PICC.  You develop chills or a fever. MAKE SURE YOU:   Understand these instructions.  Will watch your condition.  Will get help right away if you are not doing well or get worse. Document Released: 06/12/2003 Document Revised: 09/26/2013 Document Reviewed: 08/13/2013 Exit[employer] Patient Information 2014 [employer].  

## 2014-05-29 ENCOUNTER — Encounter: Payer: Self-pay | Admitting: Adult Health

## 2014-05-29 ENCOUNTER — Ambulatory Visit: Payer: Medicare Other | Admitting: Cardiovascular Disease

## 2014-05-29 ENCOUNTER — Ambulatory Visit (INDEPENDENT_AMBULATORY_CARE_PROVIDER_SITE_OTHER): Payer: Medicare Other | Admitting: Adult Health

## 2014-05-29 VITALS — BP 130/80 | HR 86 | Ht 64.0 in | Wt 220.0 lb

## 2014-05-29 DIAGNOSIS — I10 Essential (primary) hypertension: Secondary | ICD-10-CM

## 2014-05-29 DIAGNOSIS — J189 Pneumonia, unspecified organism: Secondary | ICD-10-CM

## 2014-05-29 NOTE — Assessment & Plan Note (Signed)
Currently being treated again for this as outpatient with antibiotics per primary care.

## 2014-05-29 NOTE — Assessment & Plan Note (Signed)
Selected: Blood pressure with medication changes per Dr. Pennelope Bracken. She has been taken off of the HCTZ portion of Hyzaar, and is now on Cozaar 25 mg daily. She is doing well and has no complaints of dizziness. Blood pressure is much better going from 99/33-130/80.

## 2014-05-29 NOTE — Progress Notes (Signed)
HPI: [name] is a 73 year old patient of Dr. Pennelope Bracken we are seeing for ongoing assessment and management of chest pain, with most recent pharmacological stress test in June of 2014 found to be negative for ischemia. She also has a history of COPD, with a non-ST elevation MI superimposed on acute respiratory failure in the setting of influenza A. and pneumonia. Other history includes a prior CVA, cerebral palsy, and hypertension.  Last seen by Dr. Pennelope Bracken March of 2015. Time the patient was without recurrence of discomfort in her chest. Blood pressure is 99/33 on last eat now. Her Hyzaar was discontinued and she was started on losartan 25 mg daily.  Since being changed to new medication the patient states she has been feeling very well. She is currently being treated for a recurrent pneumonia. She denies chest pain, dizziness, she is frequently coughing but does not have significant shortness of breath.    Allergies  Allergen Reactions  . Naproxen Other (See Comments)    unknown    Current Outpatient Prescriptions  Medication Sig Dispense Refill  . acetaminophen (TYLENOL) 325 MG tablet Take 2 tablets (650 mg total) by mouth every 6 (six) hours as needed for mild pain (or Fever >/= 101).      Marland Kitchen acidophilus (RISAQUAD) CAPS capsule Take 1 capsule by mouth daily.      Marland Kitchen albuterol (PROVENTIL HFA;VENTOLIN HFA) 108 (90 BASE) MCG/ACT inhaler Inhale 2 puffs into the lungs every 4 (four) hours as needed. Shortness of breath  1 Inhaler  12  . albuterol (PROVENTIL) (2.5 MG/3ML) 0.083% nebulizer solution Take 2.5 mg by nebulization 4 (four) times daily.      Marland Kitchen ALPRAZolam (XANAX) 1 MG tablet Take 1 mg by mouth 3 (three) times daily as needed for anxiety.      Marland Kitchen aspirin 325 MG tablet Take 1 tablet (325 mg total) by mouth daily.  30 tablet  12  . atorvastatin (LIPITOR) 80 MG tablet Take 1 tablet (80 mg total) by mouth daily at 6 PM.      . baclofen (LIORESAL) 10 MG tablet Take 5 mg by mouth 3  (three) times daily.      . cholecalciferol (VITAMIN D) 1000 UNITS tablet Take 1,000 Units by mouth daily.      . clopidogrel (PLAVIX) 75 MG tablet Take 1 tablet (75 mg total) by mouth daily with breakfast.      . Cranberry 450 MG CAPS Take 1 capsule by mouth daily.      Marland Kitchen dextrose 5 % SOLN 50 mL with ceFEPIme 1 G SOLR 1 g Inject 1 g into the vein every 8 (eight) hours.      . furosemide (LASIX) 20 MG tablet Take 20 mg by mouth daily.      Marland Kitchen guaiFENesin (MUCINEX) 600 MG 12 hr tablet Take 600 mg by mouth 2 (two) times daily.      Marland Kitchen levothyroxine (SYNTHROID, LEVOTHROID) 88 MCG tablet Take 1 tablet (88 mcg total) by mouth daily before breakfast.  30 tablet  12  . loratadine (CLARITIN) 10 MG tablet Take 10 mg by mouth daily.      Marland Kitchen losartan (COZAAR) 25 MG tablet Take 1 tablet (25 mg total) by mouth daily.  90 tablet  3  . nitroGLYCERIN (NITROSTAT) 0.4 MG SL tablet Place 1 tablet (0.4 mg total) under the tongue every 5 (five) minutes as needed for chest pain.  25 tablet  12  . nystatin (MYCOSTATIN) powder Apply 1 g topically  2 (two) times daily.      . pantoprazole (PROTONIX) 40 MG tablet Take 1 tablet (40 mg total) by mouth daily.  30 tablet  12  . polyethylene glycol (MIRALAX / GLYCOLAX) packet Take 17 g by mouth daily.      . potassium chloride SA (K-DUR,KLOR-CON) 20 MEQ tablet Take 20 mEq by mouth once.      . predniSONE (DELTASONE) 10 MG tablet Take 4 daily for 3 days, then 3 daily for 3 days, then 2 daily for 3 days, then one daily for 3 days and then one half daily indefinitely      . saccharomyces boulardii (FLORASTOR) 250 MG capsule Take 250 mg by mouth 2 (two) times daily.      . sertraline (ZOLOFT) 100 MG tablet Take 1 tablet (100 mg total) by mouth daily.      . sodium chloride 0.9 % injection 10-40 mLs by Intracatheter route as needed (flush).  5 mL    . traMADol (ULTRAM) 50 MG tablet Take 50 mg by mouth every 6 (six) hours as needed for moderate pain.      . vancomycin (VANCOCIN) 1  GM/200ML SOLN Inject 200 mLs (1,000 mg total) into the vein every 12 (twelve) hours.  4000 mL    . zolpidem (AMBIEN) 5 MG tablet Take 1 tablet (5 mg total) by mouth at bedtime.  30 tablet  0   No current facility-administered medications for this visit.    Past Medical History  Diagnosis Date  . Cerebral palsy   . Asthma   . Seasonal allergies   . Thyroid disease   . HTN (hypertension)   . Anxiety   . Arthritis   . Breast cancer     Right breast, infiltrating ductal.  . Anginal pain   . Osteoporosis 05/08/2012  . Osteoporosis 05/08/2012  . Stroke   . Angina at rest   . Chronic steroid use   . Pneumonia 09/2013  . Dysphagia   . Hypopotassemia     Past Surgical History  Procedure Laterality Date  . Breast lumpectomy    . Radical abdominal hysterectomy    . Dental extraction    . Right hand surgery      MVE:HMCNOB of systems complete and found to be negative unless listed above  PHYSICAL EXAM BP 130/80  Pulse 86  Ht 5\' 4"  (1.626 m)  Wt 220 lb (99.791 kg)  BMI 37.74 kg/m2  SpO2 97% General: Well developed, well nourished, in no acute distress, sitting in wheelchair. Head: Eyes PERRLA, No xanthomas.   Normal cephalic and atramatic  Lungs: Bilateral crackles, but no wheezes or rhonchi, frequent coughing. Heart: HRRR S1 S2, occasional extrasystole without MRG.  Pulses are 2+ & equal.            No carotid bruit. No JVD.  No abdominal bruits. No femoral bruits. Abdomen: Bowel sounds are positive, abdomen soft and non-tender without masses or                  Hernia's noted. Msk:  Back normal, wheelchair dependent. Normal strength and tone for age. Cerebral palsy is no skeletal abnormality and responses Extremities: No clubbing, cyanosis or edema.  DP +1 Neuro: Alert and oriented X 3. Psych:  Good affect, responds appropriately     ASSESSMENT AND PLAN

## 2014-05-29 NOTE — Patient Instructions (Signed)
Your physician recommends that you schedule a follow-up appointment in: 6 months  Your physician recommends that you continue on your current medications as directed. Please refer to the Current Medication list given to you today.  

## 2014-05-29 NOTE — Progress Notes (Deleted)
Name: [name]    DOB: Dec 12, 1941  Age: 73 y.o.  MR#: [id number]       PCP:  Alonza Bogus, MD      Insurance: Payor: MEDICARE / Plan: MEDICARE PART A AND B / Product Type: *No Product type* /   CC:    Chief Complaint  Patient presents with  . Hypertension  . Chest Pain    VS Filed Vitals:   05/29/14 1430  BP: 130/80  Pulse: 86  Height: 5\' 4"  (1.626 m)  Weight: 220 lb (99.791 kg)  SpO2: 97%    Weights Current Weight  05/29/14 220 lb (99.791 kg)  03/01/14 214 lb (97.07 kg)  01/22/14 204 lb (92.534 kg)    Blood Pressure  BP Readings from Last 3 Encounters:  05/29/14 130/80  03/01/14 99/33  01/22/14 102/68     Admit date:  (Not on file) Last encounter with RMR:  01/22/2014   Allergy Naproxen  Current Outpatient Prescriptions  Medication Sig Dispense Refill  . acetaminophen (TYLENOL) 325 MG tablet Take 2 tablets (650 mg total) by mouth every 6 (six) hours as needed for mild pain (or Fever >/= 101).      Marland Kitchen acidophilus (RISAQUAD) CAPS capsule Take 1 capsule by mouth daily.      Marland Kitchen albuterol (PROVENTIL HFA;VENTOLIN HFA) 108 (90 BASE) MCG/ACT inhaler Inhale 2 puffs into the lungs every 4 (four) hours as needed. Shortness of breath  1 Inhaler  12  . albuterol (PROVENTIL) (2.5 MG/3ML) 0.083% nebulizer solution Take 2.5 mg by nebulization 4 (four) times daily.      Marland Kitchen ALPRAZolam (XANAX) 1 MG tablet Take 1 mg by mouth 3 (three) times daily as needed for anxiety.      Marland Kitchen aspirin 325 MG tablet Take 1 tablet (325 mg total) by mouth daily.  30 tablet  12  . atorvastatin (LIPITOR) 80 MG tablet Take 1 tablet (80 mg total) by mouth daily at 6 PM.      . baclofen (LIORESAL) 10 MG tablet Take 5 mg by mouth 3 (three) times daily.      . cholecalciferol (VITAMIN D) 1000 UNITS tablet Take 1,000 Units by mouth daily.      . clopidogrel (PLAVIX) 75 MG tablet Take 1 tablet (75 mg total) by mouth daily with breakfast.      . Cranberry 450 MG CAPS Take 1 capsule by mouth daily.      Marland Kitchen  dextrose 5 % SOLN 50 mL with ceFEPIme 1 G SOLR 1 g Inject 1 g into the vein every 8 (eight) hours.      . furosemide (LASIX) 20 MG tablet Take 20 mg by mouth daily.      Marland Kitchen guaiFENesin (MUCINEX) 600 MG 12 hr tablet Take 600 mg by mouth 2 (two) times daily.      Marland Kitchen levothyroxine (SYNTHROID, LEVOTHROID) 88 MCG tablet Take 1 tablet (88 mcg total) by mouth daily before breakfast.  30 tablet  12  . loratadine (CLARITIN) 10 MG tablet Take 10 mg by mouth daily.      Marland Kitchen losartan (COZAAR) 25 MG tablet Take 1 tablet (25 mg total) by mouth daily.  90 tablet  3  . nitroGLYCERIN (NITROSTAT) 0.4 MG SL tablet Place 1 tablet (0.4 mg total) under the tongue every 5 (five) minutes as needed for chest pain.  25 tablet  12  . nystatin (MYCOSTATIN) powder Apply 1 g topically 2 (two) times daily.      . pantoprazole (PROTONIX)  40 MG tablet Take 1 tablet (40 mg total) by mouth daily.  30 tablet  12  . polyethylene glycol (MIRALAX / GLYCOLAX) packet Take 17 g by mouth daily.      . potassium chloride SA (K-DUR,KLOR-CON) 20 MEQ tablet Take 20 mEq by mouth once.      . predniSONE (DELTASONE) 10 MG tablet Take 4 daily for 3 days, then 3 daily for 3 days, then 2 daily for 3 days, then one daily for 3 days and then one half daily indefinitely      . saccharomyces boulardii (FLORASTOR) 250 MG capsule Take 250 mg by mouth 2 (two) times daily.      . sertraline (ZOLOFT) 100 MG tablet Take 1 tablet (100 mg total) by mouth daily.      . sodium chloride 0.9 % injection 10-40 mLs by Intracatheter route as needed (flush).  5 mL    . traMADol (ULTRAM) 50 MG tablet Take 50 mg by mouth every 6 (six) hours as needed for moderate pain.      . vancomycin (VANCOCIN) 1 GM/200ML SOLN Inject 200 mLs (1,000 mg total) into the vein every 12 (twelve) hours.  4000 mL    . zolpidem (AMBIEN) 5 MG tablet Take 1 tablet (5 mg total) by mouth at bedtime.  30 tablet  0   No current facility-administered medications for this visit.    Discontinued Meds:    There are no discontinued medications.  Patient Active Problem List   Diagnosis Date Noted  . NSTEMI (non-ST elevated myocardial infarction) 12/31/2013  . Sepsis 12/31/2013  . Influenza A 12/31/2013  . HCAP (healthcare-associated pneumonia) 12/31/2013  . Asthma with acute exacerbation 08/09/2013  . Pneumonia 08/09/2013  . Anxiety state, unspecified 08/09/2013  . Wheezes 06/11/2013  . Morbid obesity 06/11/2013  . Cerebral palsy 06/11/2013  . Thrombocytopenia 06/11/2013  . Asthma 06/11/2013  . Hypertension 06/11/2013  . Chronic steroid use 06/11/2013  . Leg edema, left 06/11/2013  . Chronic respiratory failure with hypoxia 06/11/2013  . Hypothyroidism 06/11/2013  . Osteoporosis 05/08/2012  . Sciatica 02/10/2012  . DDD (degenerative disc disease), lumbar 02/10/2012  . Infiltrating ductal carcinoma of breast   . Sprain of left knee 08/19/2011  . Arthritis of knee, right 08/19/2011  . Hemarthrosis involving knee joint 07/07/2011  . DEGENERATIVE JOINT DISEASE, RIGHT KNEE 12/02/2010  . BURSITIS, KNEE 12/02/2010  . CARPAL TUNNEL SYNDROME, BILATERAL 05/21/2010  . COMPLETE RUPTURE OF ROTATOR CUFF 05/21/2010  . CLOSED FRACTURE OF DISTAL END OF ULNA 04/08/2010    LABS    Component Value Date/Time   NA 145 01/01/2014 0514   NA 146 12/31/2013 0322   NA 139 11/23/2013 1218   K 4.5 01/01/2014 0514   K 3.8 12/31/2013 0322   K 4.6 11/23/2013 1218   CL 101 01/01/2014 0514   CL 97 12/31/2013 0322   CL 91* 11/23/2013 1218   CO2 39* 01/01/2014 0514   CO2 38* 12/31/2013 0322   CO2 44* 11/23/2013 1218   GLUCOSE 121* 01/01/2014 0514   GLUCOSE 150* 12/31/2013 0322   GLUCOSE 95 11/23/2013 1218   BUN 11 03/13/2014 0918   BUN 17 01/01/2014 0514   BUN 18 12/31/2013 0322   CREATININE 0.48* 03/13/2014 0918   CREATININE 0.45* 01/01/2014 0514   CREATININE 0.44* 12/31/2013 0322   CALCIUM 7.8* 01/01/2014 0514   CALCIUM 9.1 12/31/2013 0322   CALCIUM 9.5 11/23/2013 1218   GFRNONAA >90 03/13/2014 0918   GFRNONAA >90  01/01/2014 3818  GFRNONAA >90 12/31/2013 0322   GFRAA >90 03/13/2014 0918   GFRAA >90 01/01/2014 0514   GFRAA >90 12/31/2013 0322   CMP     Component Value Date/Time   NA 145 01/01/2014 0514   K 4.5 01/01/2014 0514   CL 101 01/01/2014 0514   CO2 39* 01/01/2014 0514   GLUCOSE 121* 01/01/2014 0514   BUN 11 03/13/2014 0918   CREATININE 0.48* 03/13/2014 0918   CALCIUM 7.8* 01/01/2014 0514   PROT 6.8 11/23/2013 1218   ALBUMIN 3.7 11/23/2013 1218   AST 12 11/23/2013 1218   ALT 9 11/23/2013 1218   ALKPHOS 62 11/23/2013 1218   BILITOT 0.3 11/23/2013 1218   GFRNONAA >90 03/13/2014 0918   GFRAA >90 03/13/2014 0918       Component Value Date/Time   WBC 3.7* 01/01/2014 0514   WBC 6.5 12/31/2013 0322   WBC 6.7 11/23/2013 1218   HGB 11.1* 01/01/2014 0514   HGB 12.4 12/31/2013 0322   HGB 11.7* 11/23/2013 1218   HCT 37.6 01/01/2014 0514   HCT 36.9 12/31/2013 0322   HCT 39.5 11/23/2013 1218   MCV 98.9 01/01/2014 0514   MCV 96.6 12/31/2013 0322   MCV 99.0 11/23/2013 1218    Lipid Panel     Component Value Date/Time   CHOL 156 06/29/2012 0545   TRIG 128 06/29/2012 0545   HDL 64 06/29/2012 0545   CHOLHDL 2.4 06/29/2012 0545   VLDL 26 06/29/2012 0545   LDLCALC 66 06/29/2012 0545    ABG    Component Value Date/Time   PHART 7.323* 12/31/2013 0600   PCO2ART 73.1* 12/31/2013 0600   PO2ART 90.5 12/31/2013 0600   HCO3 36.8* 12/31/2013 0600   TCO2 33.9 12/31/2013 0600   O2SAT 96.3 12/31/2013 0600     Lab Results  Component Value Date   TSH 1.339 06/11/2013   BNP (last 3 results)  Recent Labs  06/11/13 0813 08/09/13 1147 12/31/13 0322  PROBNP 67.7 75.0 408.7*   Cardiac Panel (last 3 results) No results found for this basename: CKTOTAL, CKMB, TROPONINI, RELINDX,  in the last 72 hours  Iron/TIBC/Ferritin No results found for this basename: iron, tibc, ferritin     EKG Orders placed during the hospital encounter of 12/31/13  . EKG 12-LEAD  . EKG 12-LEAD  . EKG  . ED EKG  . ED EKG     Prior Assessment  and Plan Problem List as of 05/29/2014     Cardiovascular and Mediastinum   Hypertension   Last Assessment & Plan   01/22/2014 Office Visit Written 01/22/2014  2:48 PM by Lendon Colonel, NP     BP is low normal today she is basically sedentary, wheelchair-bound, he is working with physical therapy. She denies dizziness or near syncope when standing. She will followup in one month for close evaluation by Dr. Pennelope Bracken with possibility of this decreasing her antihypertensive, losartan/HCTZ 50/12.5. May consider discontinuing the HCTZ portion. Otherwise we will continue all other medications. I requested a UA to be completed to evaluate for UTI.    NSTEMI (non-ST elevated myocardial infarction)   Last Assessment & Plan   01/22/2014 Office Visit Written 01/22/2014  2:49 PM by Lendon Colonel, NP     Related to hypoxia in the setting of flu and pneumonia. The patient is feeling much better. Doubt stress Myoview will be helpful as she will be unable to hold still under the camera. Another option would be a dobutamine stress echo. Now that she  is asymptomatic, we will wait to do any further testing at this time. If symptoms return, we will consider ischemic evaluation.      Respiratory   Asthma   Chronic respiratory failure with hypoxia   Asthma with acute exacerbation   Pneumonia   Influenza A   HCAP (healthcare-associated pneumonia)     Endocrine   Hypothyroidism     Nervous and Auditory   CARPAL TUNNEL SYNDROME, BILATERAL   Sciatica   Cerebral palsy     Musculoskeletal and Integument   COMPLETE RUPTURE OF ROTATOR CUFF   CLOSED FRACTURE OF DISTAL END OF ULNA   DEGENERATIVE JOINT DISEASE, RIGHT KNEE   BURSITIS, KNEE   Hemarthrosis involving knee joint   Sprain of left knee   Arthritis of knee, right   DDD (degenerative disc disease), lumbar   Osteoporosis     Other   Infiltrating ductal carcinoma of breast   Wheezes   Morbid obesity   Thrombocytopenia   Chronic steroid use    Leg edema, left   Anxiety state, unspecified   Sepsis       Imaging: Dg Chest Port 1 View  05/27/2014   CLINICAL DATA:  Check PICC line placement  EXAM: PORTABLE CHEST - 1 VIEW  COMPARISON:  01/02/2014  FINDINGS: A left PICC line is noted. The catheter tip appears at the level of the cavoatrial junction. No pneumothorax is seen. The cardiac shadow remains enlarged. A curvilinear density is noted overlying the right chest is likely extrinsic to the patient. No focal infiltrate is seen.  IMPRESSION: PICC line as described.  Curvilinear density over the right mid lung which was not seen on the prior exam and is likely extrinsic to the patient.   Electronically Signed   By: Inez Catalina M.D.   On: 05/27/2014 12:53

## 2014-06-03 ENCOUNTER — Ambulatory Visit (HOSPITAL_COMMUNITY): Payer: Medicare Other

## 2014-06-12 ENCOUNTER — Encounter (HOSPITAL_COMMUNITY): Payer: Self-pay

## 2014-06-13 ENCOUNTER — Emergency Department (HOSPITAL_COMMUNITY): Payer: Medicare Other

## 2014-06-13 ENCOUNTER — Encounter (HOSPITAL_COMMUNITY): Payer: Self-pay | Admitting: Emergency Medicine

## 2014-06-13 ENCOUNTER — Inpatient Hospital Stay (HOSPITAL_COMMUNITY)
Admission: EM | Admit: 2014-06-13 | Discharge: 2014-06-17 | DRG: 189 | Disposition: A | Discharge status: Skilled Nursing Facility | Payer: Medicare Other | Attending: Pulmonary Disease | Admitting: Pulmonary Disease

## 2014-06-13 DIAGNOSIS — Z8673 Personal history of transient ischemic attack (TIA), and cerebral infarction without residual deficits: Secondary | ICD-10-CM

## 2014-06-13 DIAGNOSIS — Z66 Do not resuscitate: Secondary | ICD-10-CM | POA: Diagnosis present

## 2014-06-13 DIAGNOSIS — L98491 Non-pressure chronic ulcer of skin of other sites limited to breakdown of skin: Secondary | ICD-10-CM | POA: Diagnosis present

## 2014-06-13 DIAGNOSIS — J45909 Unspecified asthma, uncomplicated: Secondary | ICD-10-CM | POA: Diagnosis present

## 2014-06-13 DIAGNOSIS — K219 Gastro-esophageal reflux disease without esophagitis: Secondary | ICD-10-CM | POA: Diagnosis present

## 2014-06-13 DIAGNOSIS — I10 Essential (primary) hypertension: Secondary | ICD-10-CM | POA: Diagnosis present

## 2014-06-13 DIAGNOSIS — J9620 Acute and chronic respiratory failure, unspecified whether with hypoxia or hypercapnia: Principal | ICD-10-CM | POA: Diagnosis present

## 2014-06-13 DIAGNOSIS — M810 Age-related osteoporosis without current pathological fracture: Secondary | ICD-10-CM | POA: Diagnosis present

## 2014-06-13 DIAGNOSIS — J9622 Acute and chronic respiratory failure with hypercapnia: Secondary | ICD-10-CM

## 2014-06-13 DIAGNOSIS — Z853 Personal history of malignant neoplasm of breast: Secondary | ICD-10-CM

## 2014-06-13 DIAGNOSIS — J9690 Respiratory failure, unspecified, unspecified whether with hypoxia or hypercapnia: Secondary | ICD-10-CM | POA: Diagnosis present

## 2014-06-13 DIAGNOSIS — J4489 Other specified chronic obstructive pulmonary disease: Secondary | ICD-10-CM | POA: Diagnosis present

## 2014-06-13 DIAGNOSIS — Z7902 Long term (current) use of antithrombotics/antiplatelets: Secondary | ICD-10-CM

## 2014-06-13 DIAGNOSIS — Z8249 Family history of ischemic heart disease and other diseases of the circulatory system: Secondary | ICD-10-CM

## 2014-06-13 DIAGNOSIS — Z833 Family history of diabetes mellitus: Secondary | ICD-10-CM

## 2014-06-13 DIAGNOSIS — J9602 Acute respiratory failure with hypercapnia: Secondary | ICD-10-CM

## 2014-06-13 DIAGNOSIS — Z9981 Dependence on supplemental oxygen: Secondary | ICD-10-CM

## 2014-06-13 DIAGNOSIS — N390 Urinary tract infection, site not specified: Secondary | ICD-10-CM | POA: Diagnosis present

## 2014-06-13 DIAGNOSIS — Z79899 Other long term (current) drug therapy: Secondary | ICD-10-CM

## 2014-06-13 DIAGNOSIS — Z825 Family history of asthma and other chronic lower respiratory diseases: Secondary | ICD-10-CM

## 2014-06-13 DIAGNOSIS — Z6837 Body mass index (BMI) 37.0-37.9, adult: Secondary | ICD-10-CM

## 2014-06-13 DIAGNOSIS — F411 Generalized anxiety disorder: Secondary | ICD-10-CM | POA: Diagnosis present

## 2014-06-13 DIAGNOSIS — J449 Chronic obstructive pulmonary disease, unspecified: Secondary | ICD-10-CM | POA: Diagnosis present

## 2014-06-13 DIAGNOSIS — E662 Morbid (severe) obesity with alveolar hypoventilation: Secondary | ICD-10-CM | POA: Diagnosis present

## 2014-06-13 DIAGNOSIS — G9341 Metabolic encephalopathy: Secondary | ICD-10-CM | POA: Diagnosis present

## 2014-06-13 DIAGNOSIS — M129 Arthropathy, unspecified: Secondary | ICD-10-CM | POA: Diagnosis present

## 2014-06-13 DIAGNOSIS — G809 Cerebral palsy, unspecified: Secondary | ICD-10-CM | POA: Diagnosis present

## 2014-06-13 DIAGNOSIS — I252 Old myocardial infarction: Secondary | ICD-10-CM

## 2014-06-13 DIAGNOSIS — IMO0002 Reserved for concepts with insufficient information to code with codable children: Secondary | ICD-10-CM

## 2014-06-13 DIAGNOSIS — J9611 Chronic respiratory failure with hypoxia: Secondary | ICD-10-CM | POA: Diagnosis present

## 2014-06-13 HISTORY — DX: Retention of urine, unspecified: R33.9

## 2014-06-13 HISTORY — DX: Neuromuscular dysfunction of bladder, unspecified: N31.9

## 2014-06-13 HISTORY — DX: Obesity, unspecified: E66.9

## 2014-06-13 HISTORY — DX: Weakness: R53.1

## 2014-06-13 HISTORY — DX: Respiratory failure, unspecified, unspecified whether with hypoxia or hypercapnia: J96.90

## 2014-06-13 HISTORY — DX: Gastro-esophageal reflux disease without esophagitis: K21.9

## 2014-06-13 HISTORY — DX: Acute myocardial infarction, unspecified: I21.9

## 2014-06-13 HISTORY — DX: Sepsis, unspecified organism: A41.9

## 2014-06-13 HISTORY — DX: Reserved for inherently not codable concepts without codable children: IMO0001

## 2014-06-13 LAB — CBC WITH DIFFERENTIAL/PLATELET
Basophils Absolute: 0 10*3/uL (ref 0.0–0.1)
Basophils Relative: 0 % (ref 0–1)
EOS ABS: 0.4 10*3/uL (ref 0.0–0.7)
Eosinophils Relative: 6 % — ABNORMAL HIGH (ref 0–5)
HEMATOCRIT: 37.3 % (ref 36.0–46.0)
HEMOGLOBIN: 11 g/dL — AB (ref 12.0–15.0)
LYMPHS ABS: 0.8 10*3/uL (ref 0.7–4.0)
LYMPHS PCT: 13 % (ref 12–46)
MCH: 28.6 pg (ref 26.0–34.0)
MCHC: 29.5 g/dL — ABNORMAL LOW (ref 30.0–36.0)
MCV: 97.1 fL (ref 78.0–100.0)
MONO ABS: 0.5 10*3/uL (ref 0.1–1.0)
MONOS PCT: 9 % (ref 3–12)
Neutro Abs: 4.2 10*3/uL (ref 1.7–7.7)
Neutrophils Relative %: 72 % (ref 43–77)
Platelets: 165 10*3/uL (ref 150–400)
RBC: 3.84 MIL/uL — AB (ref 3.87–5.11)
RDW: 15.3 % (ref 11.5–15.5)
WBC: 5.9 10*3/uL (ref 4.0–10.5)

## 2014-06-13 LAB — BASIC METABOLIC PANEL
BUN: 16 mg/dL (ref 6–23)
CALCIUM: 8.6 mg/dL (ref 8.4–10.5)
CO2: 45 meq/L — AB (ref 19–32)
CREATININE: 0.45 mg/dL — AB (ref 0.50–1.10)
Chloride: 94 mEq/L — ABNORMAL LOW (ref 96–112)
GFR calc Af Amer: >90 mL/min (ref >90)
GFR calc non Af Amer: >90 mL/min (ref >90)
GLUCOSE: 99 mg/dL (ref 70–99)
Potassium: 4.4 mEq/L (ref 3.7–5.3)
SODIUM: 141 meq/L (ref 137–147)

## 2014-06-13 LAB — TROPONIN I

## 2014-06-13 LAB — BLOOD GAS, ARTERIAL
ACID-BASE EXCESS: 14.1 mmol/L — AB (ref 0.0–2.0)
Acid-Base Excess: 14.4 mmol/L — ABNORMAL HIGH (ref 0.0–2.0)
BICARBONATE: 42.1 meq/L — AB (ref 20.0–24.0)
Bicarbonate: 41.7 mEq/L — ABNORMAL HIGH (ref 20.0–24.0)
DRAWN BY: 22766
Delivery systems: POSITIVE
Expiratory PAP: 5
FIO2: 0.32 %
FIO2: 0.35 %
INSPIRATORY PAP: 14
LHR: 12 {breaths}/min
O2 Content: 3 L/min
O2 Saturation: 95.3 %
O2 Saturation: 94.5 %
PATIENT TEMPERATURE: 37
PCO2 ART: 95.1 mmHg — AB (ref 35.0–45.0)
PH ART: 7.216 — AB (ref 7.350–7.450)
PO2 ART: 78.4 mmHg — AB (ref 80.0–100.0)
Patient temperature: 37
TCO2: 40.1 mmol/L (ref 0–100)
TCO2: 39.1 mmol/L (ref 0–100)
pCO2 arterial: 108 mmHg (ref 35.0–45.0)
pH, Arterial: 7.265 — ABNORMAL LOW (ref 7.350–7.450)
pO2, Arterial: 93.1 mmHg (ref 80.0–100.0)

## 2014-06-13 LAB — HEPATIC FUNCTION PANEL
ALT: 10 U/L (ref 0–35)
AST: 14 U/L (ref 0–37)
Albumin: 3.3 g/dL — ABNORMAL LOW (ref 3.5–5.2)
Alkaline Phosphatase: 83 U/L (ref 39–117)
BILIRUBIN TOTAL: 0.4 mg/dL (ref 0.3–1.2)
Total Protein: 6.1 g/dL (ref 6.0–8.3)

## 2014-06-13 LAB — LIPASE, BLOOD: Lipase: 28 U/L (ref 11–59)

## 2014-06-13 LAB — LACTIC ACID, PLASMA: Lactic Acid, Venous: 0.7 mmol/L (ref 0.5–2.2)

## 2014-06-13 LAB — PRO B NATRIURETIC PEPTIDE: PRO B NATRI PEPTIDE: 172.5 pg/mL — AB (ref 0–125)

## 2014-06-13 MED ORDER — SODIUM CHLORIDE 0.9 % IV BOLUS (SEPSIS)
250.0000 mL | Freq: Once | INTRAVENOUS | Status: AC
  Administered 2014-06-13: 250 mL via INTRAVENOUS

## 2014-06-13 MED ORDER — SODIUM CHLORIDE 0.9 % IV SOLN
INTRAVENOUS | Status: DC
  Administered 2014-06-13: 22:00:00 via INTRAVENOUS

## 2014-06-13 NOTE — ED Notes (Signed)
CRITICAL VALUE ALERT  Critical value received:  PH - 7.22, CO2 - 108, PO2 - 93, Bicarb - 42  Date of notification:  06/13/2014  Time of notification:  2045  Critical value read back: yes  Nurse who received alert:  LJS  MD notified (1st page):  Dr Rogene Houston  Time of first page:  2046  MD notified (2nd page):  Time of second page:  Responding MD:  Dr Rogene Houston  Time MD responded:  2046

## 2014-06-13 NOTE — ED Notes (Signed)
CRITICAL VALUE ALERT  Critical value received:  PH - 7.265, CO2 - 95.1, PO2 - 78.4, Bicarb - 41.7  Date of notification:  06/13/2014  Time of notification:  2343  Critical value read back: yes  Nurse who received alert:  LJS  MD notified (1st page):  Dr Rogene Houston  Time of first page:  2343  MD notified (2nd page):  Time of second page:  Responding MD:  Dr Rogene Houston  Time MD responded:  [phone]

## 2014-06-13 NOTE — ED Notes (Signed)
CRITICAL VALUE ALERT  Critical value received:  CO2 at 45  Date of notification: 06/13/14  Time of notification:  1955  Critical value read back: Yes  Nurse who received alert:  Elwanda Brooklyn, RN  MD notified:  Dr. Rogene Houston  Time of notification: [phone]

## 2014-06-13 NOTE — ED Notes (Signed)
Mardene Celeste from [facility] called and stated they were unable to make a copy of patient's MAR. Stated they also attempted to fax unsuccessfully. Went over patient's medication list via telephone. Medication record updated at this time.

## 2014-06-13 NOTE — ED Notes (Signed)
Pt came in on 6L 02. O2 turned to 3L-maintaining 93-94%

## 2014-06-13 NOTE — ED Provider Notes (Signed)
CSN: [id number]     Arrival date & time 06/13/14  1821 History   First MD Initiated Contact with Patient 06/13/14 1930   This chart was scribed for Fredia Sorrow, MD by Anastasia Pall, ED Scribe. This patient was seen in room APA14/APA14 and the patient's care was started at 7:52 PM.   Chief Complaint  Patient presents with  . Arm Pain   LEVEL 5 CAVEAT - Condition of pt The history is provided by a relative, a caregiver, the EMS personnel and the nursing home. No language interpreter was used.   HPI Comments: [name] is a 73 y.o. female BIB EMS sent from [facility] to the Emergency Department for low oxygen levels and left arm pain, onset immediately PTA. She is normally on 2 liters O2 and her oxygen levels were reported at 88%. She has been at [facility] since 07/2013. Relative/caregiver states pt's BP runs low at baseline. She states pt was complaining of bilateral arm pain PTA.   Per EMS pt was on 6 L of O2 upon arrival. Pt 02 stats on 6 L was 95%. EMS states pt has h/o stroke, report speech and mental status of pt was baseline upon arrival.   PCP - HAWKINS,EDWARD L, MD No longer sees Dr. Luan Pulling, reverting to [facility] MD.   Past Medical History  Diagnosis Date  . Cerebral palsy   . Asthma   . Seasonal allergies   . Thyroid disease   . HTN (hypertension)   . Anxiety   . Arthritis   . Breast cancer     Right breast, infiltrating ductal.  . Anginal pain   . Osteoporosis 05/08/2012  . Osteoporosis 05/08/2012  . Stroke   . Angina at rest   . Chronic steroid use   . Pneumonia 09/2013  . Dysphagia   . Hypopotassemia   . Respiratory failure   . Generalized weakness   . Urinary retention   . Dysphagia   . Acute MI   . Hypopotassemia   . Sepsis   . Neurogenic bladder   . Reflux   . Obesity    Past Surgical History  Procedure Laterality Date  . Breast lumpectomy    . Radical abdominal hysterectomy    . Dental extraction    . Right hand surgery     Family History  Problem  Relation Age of Onset  . Cancer    . Diabetes    . Arthritis    . Asthma    . Heart attack Father   . Heart attack Mother    History  Substance Use Topics  . Smoking status: Never Smoker   . Smokeless tobacco: Never Used  . Alcohol Use: No   OB History   Grav Para Term Preterm Abortions TAB SAB Ect Mult Living                 Level 5 Caveat - Condition of pt Some symptoms given by caregiver Review of Systems  Unable to perform ROS: Acuity of condition  Gastrointestinal: Negative for vomiting.  Musculoskeletal: Positive for arthralgias (Bilateral arm pain).   Allergies  Naproxen  Home Medications   Prior to Admission medications   Medication Sig Start Date End Date Taking? Authorizing Provider  Amino Acids-Protein Hydrolys (FEEDING SUPPLEMENT, PRO-STAT SUGAR FREE 64,) LIQD Take 30 mLs by mouth 2 (two) times daily with a meal.   Yes Historical Provider, MD  temazepam (RESTORIL) 7.5 MG capsule Take 7.5 mg by mouth at bedtime as  needed for sleep.   Yes Historical Provider, MD  acetaminophen (TYLENOL) 325 MG tablet Take 2 tablets (650 mg total) by mouth every 6 (six) hours as needed for mild pain (or Fever >/= 101). 01/04/14   Alonza Bogus, MD  acidophilus (RISAQUAD) CAPS capsule Take 1 capsule by mouth daily.    Historical Provider, MD  albuterol (PROVENTIL HFA;VENTOLIN HFA) 108 (90 BASE) MCG/ACT inhaler Inhale 2 puffs into the lungs every 4 (four) hours as needed. Shortness of breath 06/13/13   Alonza Bogus, MD  albuterol (PROVENTIL) (2.5 MG/3ML) 0.083% nebulizer solution Take 2.5 mg by nebulization 4 (four) times daily.    Historical Provider, MD  ALPRAZolam [name]) 1 MG tablet Take 1 mg by mouth 3 (three) times daily as needed for anxiety.    Historical Provider, MD  aspirin 325 MG tablet Take 1 tablet (325 mg total) by mouth daily. 06/13/13   Alonza Bogus, MD  atorvastatin (LIPITOR) 80 MG tablet Take 1 tablet (80 mg total) by mouth daily at 6 PM. 01/04/14   Alonza Bogus, MD  baclofen (LIORESAL) 10 MG tablet Take 5 mg by mouth 3 (three) times daily.    Historical Provider, MD  cholecalciferol (VITAMIN D) 1000 UNITS tablet Take 1,000 Units by mouth daily.    Historical Provider, MD  clopidogrel (PLAVIX) 75 MG tablet Take 1 tablet (75 mg total) by mouth daily with breakfast. 01/04/14   Alonza Bogus, MD  Cranberry 450 MG CAPS Take 1 capsule by mouth daily.    Historical Provider, MD  dextrose 5 % SOLN 50 mL with ceFEPIme 1 G SOLR 1 g Inject 1 g into the vein every 8 (eight) hours. 01/04/14   Alonza Bogus, MD  furosemide (LASIX) 20 MG tablet Take 20 mg by mouth daily.    Historical Provider, MD  guaiFENesin (MUCINEX) 600 MG 12 hr tablet Take 600 mg by mouth 2 (two) times daily.    Historical Provider, MD  levothyroxine (SYNTHROID, LEVOTHROID) 88 MCG tablet Take 1 tablet (88 mcg total) by mouth daily before breakfast. 06/13/13   Alonza Bogus, MD  loratadine (CLARITIN) 10 MG tablet Take 10 mg by mouth daily.    Historical Provider, MD  losartan (COZAAR) 25 MG tablet Take 1 tablet (25 mg total) by mouth daily. 03/01/14   Herminio Commons, MD  nitroGLYCERIN (NITROSTAT) 0.4 MG SL tablet Place 1 tablet (0.4 mg total) under the tongue every 5 (five) minutes as needed for chest pain. 06/13/13   Alonza Bogus, MD  nystatin (MYCOSTATIN) powder Apply 1 g topically 2 (two) times daily.    Historical Provider, MD  pantoprazole (PROTONIX) 40 MG tablet Take 1 tablet (40 mg total) by mouth daily. 06/13/13   Alonza Bogus, MD  polyethylene glycol [facility] / Floria Raveling) packet Take 17 g by mouth daily.    Historical Provider, MD  potassium chloride SA (K-DUR,KLOR-CON) 20 MEQ tablet Take 10 mEq by mouth once.  01/04/14   Alonza Bogus, MD  predniSONE (DELTASONE) 10 MG tablet Take 5 mg by mouth daily with breakfast. Take 4 daily for 3 days, then 3 daily for 3 days, then 2 daily for 3 days, then one daily for 3 days and then one half daily indefinitely 01/04/14   Alonza Bogus, MD  saccharomyces boulardii (FLORASTOR) 250 MG capsule Take 250 mg by mouth 2 (two) times daily.    Historical Provider, MD  sertraline (ZOLOFT) 100 MG tablet Take 1  tablet (100 mg total) by mouth daily. 08/13/13   Alonza Bogus, MD  sodium chloride 0.9 % injection 10-40 mLs by Intracatheter route as needed (flush). 01/04/14   Alonza Bogus, MD  traMADol (ULTRAM) 50 MG tablet Take 50 mg by mouth every 6 (six) hours as needed for moderate pain.    Historical Provider, MD  vancomycin (VANCOCIN) 1 GM/200ML SOLN Inject 200 mLs (1,000 mg total) into the vein every 12 (twelve) hours. 01/04/14   Alonza Bogus, MD  zolpidem (AMBIEN) 5 MG tablet Take 1 tablet (5 mg total) by mouth at bedtime. 08/13/13   Alonza Bogus, MD   BP 102/73  Pulse 84  Temp(Src) 98.5 F (36.9 C) (Oral)  Resp 15  Ht 5\' 9"  (1.753 m)  Wt 220 lb (99.791 kg)  BMI 32.47 kg/m2  SpO2 96% Physical Exam  Nursing note and vitals reviewed. Constitutional: She is oriented to person, place, and time. She appears well-developed and well-nourished. No distress.  HENT:  Head: Normocephalic and atraumatic.  Neck: Neck supple. No tracheal deviation present.  Cardiovascular: Normal rate, regular rhythm and normal heart sounds.   No murmur heard. BP 96/64  Pulmonary/Chest: Effort normal and breath sounds normal. No respiratory distress.  Pulse ox 96%  Abdominal: Soft. Bowel sounds are normal. There is no tenderness.  Musculoskeletal: Normal range of motion. She exhibits no edema.  Neurological: She is alert and oriented to person, place, and time. No cranial nerve deficit. She exhibits normal muscle tone. Coordination normal.  Skin: Skin is warm and dry.  Psychiatric: She has a normal mood and affect. Her behavior is normal.    ED Course  Procedures (including critical care time)  DIAGNOSTIC STUDIES: Oxygen Saturation is 96% on room air, normal by my interpretation.    COORDINATION OF CARE: 7:38-Discussed  treatment plan with pt at bedside and pt agreed to plan.   Medications  0.9 %  sodium chloride infusion ( Intravenous New Bag/Given 06/13/14 2211)  sodium chloride 0.9 % bolus 250 mL (0 mLs Intravenous Stopped 06/13/14 2155)   Results for orders placed during the hospital encounter of 06/13/14  CBC WITH DIFFERENTIAL      Result Value Ref Range   WBC 5.9  4.0 - 10.5 K/uL   RBC 3.84 (*) 3.87 - 5.11 MIL/uL   Hemoglobin 11.0 (*) 12.0 - 15.0 g/dL   HCT 37.3  36.0 - 46.0 %   MCV 97.1  78.0 - 100.0 fL   MCH 28.6  26.0 - 34.0 pg   MCHC 29.5 (*) 30.0 - 36.0 g/dL   RDW 15.3  11.5 - 15.5 %   Platelets 165  150 - 400 K/uL   Neutrophils Relative % 72  43 - 77 %   Neutro Abs 4.2  1.7 - 7.7 K/uL   Lymphocytes Relative 13  12 - 46 %   Lymphs Abs 0.8  0.7 - 4.0 K/uL   Monocytes Relative 9  3 - 12 %   Monocytes Absolute 0.5  0.1 - 1.0 K/uL   Eosinophils Relative 6 (*) 0 - 5 %   Eosinophils Absolute 0.4  0.0 - 0.7 K/uL   Basophils Relative 0  0 - 1 %   Basophils Absolute 0.0  0.0 - 0.1 K/uL  BASIC METABOLIC PANEL      Result Value Ref Range   Sodium 141  137 - 147 mEq/L   Potassium 4.4  3.7 - 5.3 mEq/L   Chloride 94 (*) 96 -  112 mEq/L   CO2 45 (*) 19 - 32 mEq/L   Glucose, Bld 99  70 - 99 mg/dL   BUN 16  6 - 23 mg/dL   Creatinine, Ser 0.45 (*) 0.50 - 1.10 mg/dL   Calcium 8.6  8.4 - 10.5 mg/dL   GFR calc non Af Amer >90  >90 mL/min   GFR calc Af Amer >90  >90 mL/min  TROPONIN I      Result Value Ref Range   Troponin I <0.30  <0.30 ng/mL  PRO B NATRIURETIC PEPTIDE      Result Value Ref Range   Pro B Natriuretic peptide (BNP) 172.5 (*) 0 - 125 pg/mL  HEPATIC FUNCTION PANEL      Result Value Ref Range   Total Protein 6.1  6.0 - 8.3 g/dL   Albumin 3.3 (*) 3.5 - 5.2 g/dL   AST 14  0 - 37 U/L   ALT 10  0 - 35 U/L   Alkaline Phosphatase 83  39 - 117 U/L   Total Bilirubin 0.4  0.3 - 1.2 mg/dL   Bilirubin, Direct <0.2  0.0 - 0.3 mg/dL   Indirect Bilirubin NOT CALCULATED  0.3 - 0.9 mg/dL   LIPASE, BLOOD      Result Value Ref Range   Lipase 28  11 - 59 U/L  LACTIC ACID, PLASMA      Result Value Ref Range   Lactic Acid, Venous 0.7  0.5 - 2.2 mmol/L  BLOOD GAS, ARTERIAL      Result Value Ref Range   FIO2 0.32     O2 Content 3.0     Delivery systems NASAL CANNULA     pH, Arterial 7.216 (*) 7.350 - 7.450   pCO2 arterial 108.0 (*) 35.0 - 45.0 mmHg   pO2, Arterial 93.1  80.0 - 100.0 mmHg   Bicarbonate 42.1 (*) 20.0 - 24.0 mEq/L   TCO2 40.1  0 - 100 mmol/L   Acid-Base Excess 14.1 (*) 0.0 - 2.0 mmol/L   O2 Saturation 95.3     Patient temperature 37.0     Collection site LEFT RADIAL     Drawn by C. HALL     Sample type ARTERIAL DRAW     Allens test (pass/fail) PASS  PASS  BLOOD GAS, ARTERIAL      Result Value Ref Range   FIO2 0.35     Delivery systems BILEVEL POSITIVE AIRWAY PRESSURE     Rate 12     Inspiratory PAP 14     Expiratory PAP 5     pH, Arterial 7.265 (*) 7.350 - 7.450   pCO2 arterial 95.1 (*) 35.0 - 45.0 mmHg   pO2, Arterial 78.4 (*) 80.0 - 100.0 mmHg   Bicarbonate 41.7 (*) 20.0 - 24.0 mEq/L   TCO2 39.1  0 - 100 mmol/L   Acid-Base Excess 14.4 (*) 0.0 - 2.0 mmol/L   O2 Saturation 94.5     Patient temperature 37.0     Collection site LEFT RADIAL     Drawn by [id number]     Sample type ARTERIAL DRAW     Allens test (pass/fail) PASS  PASS   Ct Head Wo Contrast  06/13/2014   CLINICAL DATA:  Left arm pain.  History of stroke.  No known injury.  EXAM: CT HEAD WITHOUT CONTRAST  TECHNIQUE: Contiguous axial images were obtained from the base of the skull through the vertex without intravenous contrast.  COMPARISON:  06/27/2012  FINDINGS: Technically limited study  due to motion artifact. Focal cystic area of encephalomalacia in the right basal ganglion, likely representing old lacunar infarct. This is unchanged since prior study. No new focal lesions are demonstrated. No mass effect or midline shift. No abnormal extra-axial fluid collections. Gray-white matter junctions  are distinct. Basal cisterns are not effaced. No ventricular dilatation. No evidence of acute intracranial hemorrhage. No depressed skull fractures. Opacification and/or mucosal thickening demonstrated in both maxillary antra, the right ethmoid air cells and sphenoid sinus, and the left frontal sinus. Mastoid air cells are not opacified. Degenerative changes noted in the upper cervical spine with evidence of fragmentation and mild basilar invagination, likely chronic. Changes are stable since previous cervical spine series from 04/16/2010.  IMPRESSION: No acute intracranial abnormalities. Probable old lacunar infarct in the right basal ganglion. Chronic inflammatory changes in the paranasal sinuses. Chronic degenerative changes in the upper cervical spine.   Electronically Signed   By: Lucienne Capers M.D.   On: 06/13/2014 21:21   Dg Chest Portable 1 View  06/13/2014   CLINICAL DATA:  Left arm pain  EXAM: PORTABLE CHEST - 1 VIEW  COMPARISON:  05/27/2014, 01/02/2014  FINDINGS: Bilateral diffuse mild interstitial thickening. Prominence of the central pulmonary vasculature. No pleural effusion or pneumothorax. Stable cardiomegaly. Unremarkable osseous structures.  IMPRESSION: Mild bilateral interstitial thickening likely chronic. Mild superimposed interstitial edema or infection cannot be excluded.   Electronically Signed   By: Kathreen Devoid   On: 06/13/2014 19:39     EKG Interpretation   Date/Time:  Thursday June 13 2014 18:31:07 EDT Ventricular Rate:  85 PR Interval:  172 QRS Duration: 97 QT Interval:  388 QTC Calculation: 461 R Axis:   98 Text Interpretation:  Sinus rhythm Right axis deviation Low voltage,  precordial leads No significant change since last tracing Confirmed by  ZACKOWSKI  MD, Nicki Reaper ([id number]) on 06/13/2014 8:06:19 PM Also confirmed by  Rogene [place]  MD, SCOTT [phone])  on 06/13/2014 8:51:32 PM      CRITICAL CARE Performed by: Fredia Sorrow Total critical care time: 30 Critical  care time was exclusive of separately billable procedures and treating other patients. Critical care was necessary to treat or prevent imminent or life-threatening deterioration. Critical care was time spent personally by me on the following activities: development of treatment plan with patient and/or surrogate as well as nursing, discussions with consultants, evaluation of patient's response to treatment, examination of patient, obtaining history from patient or surrogate, ordering and performing treatments and interventions, ordering and review of laboratory studies, ordering and review of radiographic studies, pulse oximetry and re-evaluation of patient's condition.     MDM   Final diagnoses:  Acute respiratory failure with hypercapnia    Patient based on her labs has chronic CO2 retention. The first blood gas here tonight showed marked it does CO2 retention up to 108. PO2 was reasonable at 93. Patient started on BiPAP. Discussion with family. Patient is from [facility]. Does not have formal DO NOT RESUSCITATE paperwork. However her family made it clear that she did not want to be put on a ventilator or have any prolonged life-saving measures to include the CPR or cardiac resuscitation. DO NOT RESUSCITATE order placed.  Patient tolerated the BiPAP extremely well. Patient became more alert though is more talkative. Repeat blood gas showed some improvement in the PCO2 down to 95.  Rest the patient's workup without any significant findings. No hypotension no significant tachycardia. EKG without acute changes. Troponin was negative lactic acid was not elevated.  Chest x-ray was negative for pneumonia or CHF. Patient without any leukocytosis.  We'll discuss with hospitalist for admission. We'll need to continue BiPAP.   I personally performed the services described in this documentation, which was scribed in my presence. The recorded information has been reviewed and is  accurate.     Fredia Sorrow, MD 06/13/14 [phone]

## 2014-06-13 NOTE — ED Notes (Addendum)
Per EMS, pt resident of [facility]. EMS called out in reference to left arm pain,decreased oxygen saturation that started today. Per EMS, pt has history of stroke but reports pt speech and mentation at baseline at time of arrival. Pt normally on 2liters [place] at [facility]. Pt O2 Sat at [facility] 88%. Pt on 6 liters at time of arrival with EMS. Pt o2 sats on 6 liters 95%. nad noted.

## 2014-06-14 ENCOUNTER — Encounter (HOSPITAL_COMMUNITY): Payer: Self-pay

## 2014-06-14 ENCOUNTER — Inpatient Hospital Stay (HOSPITAL_COMMUNITY): Payer: Medicare Other

## 2014-06-14 DIAGNOSIS — J9620 Acute and chronic respiratory failure, unspecified whether with hypoxia or hypercapnia: Principal | ICD-10-CM

## 2014-06-14 DIAGNOSIS — G9341 Metabolic encephalopathy: Secondary | ICD-10-CM | POA: Diagnosis present

## 2014-06-14 DIAGNOSIS — J9690 Respiratory failure, unspecified, unspecified whether with hypoxia or hypercapnia: Secondary | ICD-10-CM | POA: Diagnosis present

## 2014-06-14 DIAGNOSIS — J9600 Acute respiratory failure, unspecified whether with hypoxia or hypercapnia: Secondary | ICD-10-CM

## 2014-06-14 DIAGNOSIS — I10 Essential (primary) hypertension: Secondary | ICD-10-CM

## 2014-06-14 DIAGNOSIS — J45909 Unspecified asthma, uncomplicated: Secondary | ICD-10-CM

## 2014-06-14 DIAGNOSIS — G809 Cerebral palsy, unspecified: Secondary | ICD-10-CM

## 2014-06-14 DIAGNOSIS — J9622 Acute and chronic respiratory failure with hypercapnia: Secondary | ICD-10-CM | POA: Diagnosis present

## 2014-06-14 DIAGNOSIS — F411 Generalized anxiety disorder: Secondary | ICD-10-CM

## 2014-06-14 LAB — BLOOD GAS, ARTERIAL
ACID-BASE EXCESS: 17.1 mmol/L — AB (ref 0.0–2.0)
Acid-Base Excess: 16.4 mmol/L — ABNORMAL HIGH (ref 0.0–2.0)
Bicarbonate: 44.3 mEq/L — ABNORMAL HIGH (ref 20.0–24.0)
Bicarbonate: 43.2 mEq/L — ABNORMAL HIGH (ref 20.0–24.0)
DRAWN BY: 27407
Drawn by: 38235
FIO2: 0.28 %
O2 CONTENT: 3 L/min
O2 SAT: 93.8 %
O2 Saturation: 87.8 %
PCO2 ART: 87.9 mmHg — AB (ref 35.0–45.0)
PO2 ART: 58.7 mmHg — AB (ref 80.0–100.0)
Patient temperature: 37
Patient temperature: 37
TCO2: 41.8 mmol/L (ref 0–100)
TCO2: 40.2 mmol/L (ref 0–100)
pCO2 arterial: 95.2 mmHg (ref 35.0–45.0)
pH, Arterial: 7.29 — ABNORMAL LOW (ref 7.350–7.450)
pH, Arterial: 7.312 — ABNORMAL LOW (ref 7.350–7.450)
pO2, Arterial: 71.4 mmHg — ABNORMAL LOW (ref 80.0–100.0)

## 2014-06-14 LAB — CBC
HCT: 38.4 % (ref 36.0–46.0)
HEMOGLOBIN: 11.2 g/dL — AB (ref 12.0–15.0)
MCH: 28.5 pg (ref 26.0–34.0)
MCHC: 29.2 g/dL — AB (ref 30.0–36.0)
MCV: 97.7 fL (ref 78.0–100.0)
Platelets: 153 10*3/uL (ref 150–400)
RBC: 3.93 MIL/uL (ref 3.87–5.11)
RDW: 15.1 % (ref 11.5–15.5)
WBC: 6.3 10*3/uL (ref 4.0–10.5)

## 2014-06-14 LAB — URINALYSIS, ROUTINE W REFLEX MICROSCOPIC
BILIRUBIN URINE: NEGATIVE
Glucose, UA: NEGATIVE mg/dL
KETONES UR: NEGATIVE mg/dL
Nitrite: POSITIVE — AB
Specific Gravity, Urine: 1.015 (ref 1.005–1.030)
UROBILINOGEN UA: 0.2 mg/dL (ref 0.0–1.0)
pH: 5.5 (ref 5.0–8.0)

## 2014-06-14 LAB — BASIC METABOLIC PANEL
BUN: 16 mg/dL (ref 6–23)
CO2: 44 mEq/L (ref 19–32)
Calcium: 9.1 mg/dL (ref 8.4–10.5)
Chloride: 96 mEq/L (ref 96–112)
Creatinine, Ser: 0.34 mg/dL — ABNORMAL LOW (ref 0.50–1.10)
GFR calc Af Amer: >90 mL/min (ref >90)
GFR calc non Af Amer: >90 mL/min (ref >90)
Glucose, Bld: 81 mg/dL (ref 70–99)
POTASSIUM: 4.2 meq/L (ref 3.7–5.3)
Sodium: 145 mEq/L (ref 137–147)

## 2014-06-14 LAB — URINE MICROSCOPIC-ADD ON

## 2014-06-14 LAB — MRSA PCR SCREENING: MRSA BY PCR: POSITIVE — AB

## 2014-06-14 MED ORDER — METHYLPREDNISOLONE SODIUM SUCC 40 MG IJ SOLR
40.0000 mg | Freq: Four times a day (QID) | INTRAMUSCULAR | Status: DC
  Administered 2014-06-14 – 2014-06-16 (×9): 40 mg via INTRAVENOUS
  Filled 2014-06-14 (×9): qty 1

## 2014-06-14 MED ORDER — TEMAZEPAM 7.5 MG PO CAPS
7.5000 mg | ORAL_CAPSULE | Freq: Every evening | ORAL | Status: DC | PRN
  Administered 2014-06-14 – 2014-06-16 (×3): 7.5 mg via ORAL
  Filled 2014-06-14 (×3): qty 1

## 2014-06-14 MED ORDER — SODIUM CHLORIDE 0.9 % IV SOLN: INTRAVENOUS | Status: DC

## 2014-06-14 MED ORDER — ENOXAPARIN SODIUM 40 MG/0.4ML ~~LOC~~ SOLN
40.0000 mg | SUBCUTANEOUS | Status: DC
  Administered 2014-06-14 – 2014-06-17 (×4): 40 mg via SUBCUTANEOUS
  Filled 2014-06-14 (×4): qty 0.4

## 2014-06-14 MED ORDER — CHLORHEXIDINE GLUCONATE 0.12 % MT SOLN
15.0000 mL | Freq: Two times a day (BID) | OROMUCOSAL | Status: DC
  Administered 2014-06-14 – 2014-06-17 (×7): 15 mL via OROMUCOSAL
  Filled 2014-06-14 (×7): qty 15

## 2014-06-14 MED ORDER — VITAMIN D 1000 UNITS PO TABS
1000.0000 [IU] | ORAL_TABLET | Freq: Every day | ORAL | Status: DC
  Administered 2014-06-14 – 2014-06-17 (×4): 1000 [IU] via ORAL
  Filled 2014-06-14 (×4): qty 1

## 2014-06-14 MED ORDER — FUROSEMIDE 20 MG PO TABS: 20.0000 mg | ORAL_TABLET | Freq: Every day | ORAL | Status: DC

## 2014-06-14 MED ORDER — CHLORHEXIDINE GLUCONATE CLOTH 2 % EX PADS
6.0000 | MEDICATED_PAD | Freq: Every day | CUTANEOUS | Status: DC
  Administered 2014-06-14 – 2014-06-17 (×4): 6 via TOPICAL

## 2014-06-14 MED ORDER — METHYLPREDNISOLONE SODIUM SUCC 125 MG IJ SOLR
125.0000 mg | Freq: Once | INTRAMUSCULAR | Status: AC
  Administered 2014-06-14: 125 mg via INTRAVENOUS
  Filled 2014-06-14: qty 2

## 2014-06-14 MED ORDER — ASPIRIN 325 MG PO TABS
325.0000 mg | ORAL_TABLET | Freq: Every day | ORAL | Status: DC
  Administered 2014-06-14 – 2014-06-17 (×4): 325 mg via ORAL
  Filled 2014-06-14 (×4): qty 1

## 2014-06-14 MED ORDER — TRAMADOL HCL 50 MG PO TABS: 50.0000 mg | ORAL_TABLET | Freq: Four times a day (QID) | ORAL | Status: DC | PRN

## 2014-06-14 MED ORDER — CLOPIDOGREL BISULFATE 75 MG PO TABS
75.0000 mg | ORAL_TABLET | Freq: Every day | ORAL | Status: DC
  Administered 2014-06-14 – 2014-06-17 (×4): 75 mg via ORAL
  Filled 2014-06-14 (×4): qty 1

## 2014-06-14 MED ORDER — POTASSIUM CHLORIDE CRYS ER 10 MEQ PO TBCR
10.0000 meq | EXTENDED_RELEASE_TABLET | Freq: Once | ORAL | Status: AC
  Administered 2014-06-14: 10 meq via ORAL
  Filled 2014-06-14: qty 1

## 2014-06-14 MED ORDER — LOSARTAN POTASSIUM 50 MG PO TABS
25.0000 mg | ORAL_TABLET | Freq: Every day | ORAL | Status: DC
  Administered 2014-06-14 – 2014-06-17 (×4): 25 mg via ORAL
  Filled 2014-06-14 (×4): qty 1

## 2014-06-14 MED ORDER — SODIUM CHLORIDE 0.9 % IJ SOLN: 3.0000 mL | INTRAMUSCULAR | Status: DC | PRN

## 2014-06-14 MED ORDER — BIOTENE DRY MOUTH MT LIQD
15.0000 mL | Freq: Two times a day (BID) | OROMUCOSAL | Status: DC
  Administered 2014-06-14 – 2014-06-17 (×7): 15 mL via OROMUCOSAL

## 2014-06-14 MED ORDER — SACCHAROMYCES BOULARDII 250 MG PO CAPS
250.0000 mg | ORAL_CAPSULE | Freq: Two times a day (BID) | ORAL | Status: DC
  Administered 2014-06-14 – 2014-06-17 (×8): 250 mg via ORAL
  Filled 2014-06-14 (×8): qty 1

## 2014-06-14 MED ORDER — SODIUM CHLORIDE 0.9 % IV SOLN: 250.0000 mL | INTRAVENOUS | Status: DC | PRN

## 2014-06-14 MED ORDER — PRO-STAT SUGAR FREE PO LIQD
30.0000 mL | Freq: Two times a day (BID) | ORAL | Status: DC
  Administered 2014-06-14 – 2014-06-17 (×7): 30 mL via ORAL
  Filled 2014-06-14 (×7): qty 30

## 2014-06-14 MED ORDER — SODIUM CHLORIDE 0.9 % IJ SOLN
3.0000 mL | Freq: Two times a day (BID) | INTRAMUSCULAR | Status: DC
  Administered 2014-06-14 – 2014-06-17 (×8): 3 mL via INTRAVENOUS

## 2014-06-14 MED ORDER — ALBUTEROL SULFATE (2.5 MG/3ML) 0.083% IN NEBU
2.5000 mg | INHALATION_SOLUTION | Freq: Four times a day (QID) | RESPIRATORY_TRACT | Status: DC
  Administered 2014-06-14 – 2014-06-16 (×10): 2.5 mg via RESPIRATORY_TRACT
  Filled 2014-06-14 (×10): qty 3

## 2014-06-14 MED ORDER — SODIUM CHLORIDE 0.9 % IJ SOLN
10.0000 mL | INTRAMUSCULAR | Status: DC | PRN
  Administered 2014-06-15: 10 mL

## 2014-06-14 MED ORDER — SERTRALINE HCL 50 MG PO TABS
100.0000 mg | ORAL_TABLET | Freq: Every day | ORAL | Status: DC
  Administered 2014-06-14 – 2014-06-17 (×4): 100 mg via ORAL
  Filled 2014-06-14 (×4): qty 2

## 2014-06-14 MED ORDER — POLYETHYLENE GLYCOL 3350 17 G PO PACK
17.0000 g | PACK | Freq: Every day | ORAL | Status: DC
  Administered 2014-06-14 – 2014-06-17 (×4): 17 g via ORAL
  Filled 2014-06-14 (×4): qty 1

## 2014-06-14 MED ORDER — ATORVASTATIN CALCIUM 40 MG PO TABS
80.0000 mg | ORAL_TABLET | Freq: Every day | ORAL | Status: DC
  Administered 2014-06-14 – 2014-06-16 (×3): 80 mg via ORAL
  Filled 2014-06-14 (×3): qty 2

## 2014-06-14 MED ORDER — ALBUTEROL SULFATE (2.5 MG/3ML) 0.083% IN NEBU: 2.5000 mg | INHALATION_SOLUTION | Freq: Four times a day (QID) | RESPIRATORY_TRACT | Status: DC

## 2014-06-14 MED ORDER — ALPRAZOLAM 1 MG PO TABS
1.0000 mg | ORAL_TABLET | Freq: Three times a day (TID) | ORAL | Status: DC | PRN
  Administered 2014-06-14 – 2014-06-17 (×7): 1 mg via ORAL
  Filled 2014-06-14 (×2): qty 1
  Filled 2014-06-14: qty 2
  Filled 2014-06-14 (×3): qty 1
  Filled 2014-06-14 (×2): qty 2

## 2014-06-14 MED ORDER — FUROSEMIDE 10 MG/ML IJ SOLN
40.0000 mg | Freq: Every day | INTRAMUSCULAR | Status: DC
  Administered 2014-06-14 – 2014-06-15 (×2): 40 mg via INTRAVENOUS
  Filled 2014-06-14 (×2): qty 4

## 2014-06-14 MED ORDER — LEVOTHYROXINE SODIUM 88 MCG PO TABS
88.0000 ug | ORAL_TABLET | Freq: Every day | ORAL | Status: DC
  Administered 2014-06-14 – 2014-06-17 (×4): 88 ug via ORAL
  Filled 2014-06-14 (×7): qty 1

## 2014-06-14 MED ORDER — MUPIROCIN 2 % EX OINT
1.0000 "application " | TOPICAL_OINTMENT | Freq: Two times a day (BID) | CUTANEOUS | Status: DC
  Administered 2014-06-14 – 2014-06-17 (×7): 1 via NASAL
  Filled 2014-06-14: qty 22

## 2014-06-14 MED ORDER — LORATADINE 10 MG PO TABS
10.0000 mg | ORAL_TABLET | Freq: Every day | ORAL | Status: DC
  Administered 2014-06-14 – 2014-06-17 (×4): 10 mg via ORAL
  Filled 2014-06-14 (×4): qty 1

## 2014-06-14 MED ORDER — PANTOPRAZOLE SODIUM 40 MG PO TBEC
40.0000 mg | DELAYED_RELEASE_TABLET | Freq: Every day | ORAL | Status: DC
  Administered 2014-06-14 – 2014-06-17 (×4): 40 mg via ORAL
  Filled 2014-06-14 (×4): qty 1

## 2014-06-14 MED ORDER — NITROGLYCERIN 0.4 MG SL SUBL: 0.4000 mg | SUBLINGUAL_TABLET | SUBLINGUAL | Status: DC | PRN

## 2014-06-14 MED ORDER — GUAIFENESIN ER 600 MG PO TB12
600.0000 mg | ORAL_TABLET | Freq: Two times a day (BID) | ORAL | Status: DC
  Administered 2014-06-14 – 2014-06-17 (×8): 600 mg via ORAL
  Filled 2014-06-14 (×8): qty 1

## 2014-06-14 MED ORDER — BACLOFEN 10 MG PO TABS
5.0000 mg | ORAL_TABLET | Freq: Three times a day (TID) | ORAL | Status: DC
  Administered 2014-06-14 – 2014-06-17 (×10): 5 mg via ORAL
  Filled 2014-06-14 (×17): qty 1

## 2014-06-14 MED ORDER — ACETAMINOPHEN 325 MG PO TABS
650.0000 mg | ORAL_TABLET | Freq: Four times a day (QID) | ORAL | Status: DC | PRN
  Administered 2014-06-15 (×2): 650 mg via ORAL
  Filled 2014-06-14 (×2): qty 2

## 2014-06-14 NOTE — Progress Notes (Signed)
Critical ABG called to Dr. Luan Pulling. No new orders.

## 2014-06-14 NOTE — Care Management Note (Addendum)
    Page 1 of 1   06/17/2014     12:05:53 PM CARE MANAGEMENT NOTE 06/17/2014  Patient:  [name], [name]   Account Number:  [id number]  Date Initiated:  06/14/2014  Documentation initiated by:  Theophilus Kinds  Subjective/Objective Assessment:   Pt admitted from [facility] with respiratory failure. Pt to return to facility when medically stable.     Action/Plan:   CSW to arrange discharge to facility when medically stable.   Anticipated DC Date:  06/18/2014   Anticipated DC Plan:  SKILLED NURSING FACILITY  In-house referral  Clinical Social Worker      DC Planning Services  CM consult      Choice offered to / List presented to:             Status of service:  Completed, signed off Medicare Important Message given?  YES (If response is "NO", the following Medicare IM given date fields will be blank) Date Medicare IM given:  06/17/2014 Date Additional Medicare IM given:    Discharge Disposition:  [facility]  Per UR Regulation:    If discussed at Long Length of Stay Meetings, dates discussed:    Comments:  06/17/14 1040 Geneva Bolden RN/CM 06/14/14 [place], RN BSN CM

## 2014-06-14 NOTE — Clinical Social Work Psychosocial (Signed)
Clinical Social Work Department BRIEF PSYCHOSOCIAL ASSESSMENT 06/14/2014  Patient:  [name], [name]     Account Number:  [id number]     Admit date:  06/13/2014  Clinical Social Worker:  Edwyna Shell, CLINICAL SOCIAL WORKER  Date/Time:  06/14/2014 10:30 AM  Referred by:  CSW  Date Referred:  06/14/2014 Referred for  SNF Placement   Other Referral:   Interview type:  Family Other interview type:   Patient not alert, spoke w sister in room    PSYCHOSOCIAL DATA Living Status:  [place] Admitted from facility:  [facility] Level of care:  [place] Primary support name:  [name] Primary support relationship to patient:  S[place]LING Degree of support available:   Sister supportive and involved, patient resident of SNF.    CURRENT CONCERNS Current Concerns  Post-Acute Placement   Other Concerns:    SOCIAL WORK ASSESSMENT / PLAN CSW met w patient at room, patient very sleepy.  Per sister in room, patient has been drowsy most of the morning. Patient has difficulty w speech at baseline.   CSW spoke w sister for brief history.  Patient has been at [facility] since August 2014, sister thinks patient would like to return at discharge.  Family and patient have no concerns about care received at facility.  Sister wanted information about patient's medical condition - directed sister to RN for more information.  Per sister, patient has been moved recently due to issues w roommate. Roommate expressed concern about patient after she was diagnosed w pneumonia, feared she would be exposed to germs by contact w patient.  Sister thinks new roommate will be more tolerant.    Spoke w Jackelyn Poling, [facility] admissions.  Confirmed that patient has been at facility several months and can return at discharge.  Patient has been on Medicaid days, receives help w all ADLs.   Assessment/plan status:  Psychosocial Support/Ongoing Assessment of Needs Other assessment/ plan:    Information/referral to community resources:   Return to [facility]    PATIENT'S/FAMILY'S RESPONSE TO PLAN OF CARE: Sister wanted information from RN on patient's condition, would like update from medical team.  Request relayed to RN.        Edwyna Shell, LCSW Clinical Social Worker [phone])

## 2014-06-14 NOTE — Plan of Care (Signed)
Problem: Consults Goal: Skin Care Protocol Initiated - if Braden Score 18 or less If consults are not indicated, leave blank or document N/A Outcome: Progressing Patient has open areas to buttocks and left thigh on admission   Problem: ICU Phase Progression Outcomes Goal: O2 sats trending toward baseline Outcome: Progressing Patient placed on 2L [place] Goal: Hemodynamically stable Outcome: Progressing Vital signs are stable Goal: Pain controlled with appropriate interventions Outcome: Completed/Met Date Met:  06/14/14 No c/o pain Goal: Initial discharge plan identified Outcome: Completed/Met Date Met:  06/14/14 Back to [facility] Goal: Voiding-avoid urinary catheter unless indicated Outcome: Progressing Patient is incontinent

## 2014-06-14 NOTE — H&P (Signed)
PCP:   Alonza Bogus, MD   Chief Complaint:  Confusion, hypoxia  HPI: 73 yo female h/o chronic resp failure oxygen dependent on 2 Liters at SNF, cerebral palsy, bedridden state, htn sent in from [facility] for hypoxia and confusion.  Pt was on bipap on arrival to ED and has been on it for about 2 -3 hours.  Initially on arrival she was mildly confused which quickly resolved with bipap.  Pt now reports that her oxygen canula fell off at the nursing home, she cannot reach up to her face due to her CP.  It took about 30 minutes for the staff to place her oxygen back on, at which point her oxygen sats were very low in 80s.  She was then placed on 6 Liters of oxygen and sent to ED.  She has h/o asthma, but no dx of copd.  She denies any recent illnessess.  No cough.  No wheezing.  No sob.  No fevers.  She is feeling back to her baseline, denies any pain.  She wants to try to get off the bipap because she is thirsty.    Review of Systems:  Positive and negative as per HPI otherwise all other systems are negative  Past Medical History: Past Medical History  Diagnosis Date  . Cerebral palsy   . Asthma   . Seasonal allergies   . Thyroid disease   . HTN (hypertension)   . Anxiety   . Arthritis   . Breast cancer     Right breast, infiltrating ductal.  . Anginal pain   . Osteoporosis 05/08/2012  . Osteoporosis 05/08/2012  . Stroke   . Angina at rest   . Chronic steroid use   . Pneumonia 09/2013  . Dysphagia   . Hypopotassemia   . Respiratory failure   . Generalized weakness   . Urinary retention   . Dysphagia   . Acute MI   . Hypopotassemia   . Sepsis   . Neurogenic bladder   . Reflux   . Obesity    Past Surgical History  Procedure Laterality Date  . Breast lumpectomy    . Radical abdominal hysterectomy    . Dental extraction    . Right hand surgery      Medications: Prior to Admission medications   Medication Sig Start Date End Date Taking? Authorizing Provider  Amino  Acids-Protein Hydrolys (FEEDING SUPPLEMENT, PRO-STAT SUGAR FREE 64,) LIQD Take 30 mLs by mouth 2 (two) times daily with a meal.   Yes Historical Provider, MD  temazepam (RESTORIL) 7.5 MG capsule Take 7.5 mg by mouth at bedtime as needed for sleep.   Yes Historical Provider, MD  acetaminophen (TYLENOL) 325 MG tablet Take 2 tablets (650 mg total) by mouth every 6 (six) hours as needed for mild pain (or Fever >/= 101). 01/04/14   Alonza Bogus, MD  acidophilus (RISAQUAD) CAPS capsule Take 1 capsule by mouth daily.    Historical Provider, MD  albuterol (PROVENTIL HFA;VENTOLIN HFA) 108 (90 BASE) MCG/ACT inhaler Inhale 2 puffs into the lungs every 4 (four) hours as needed. Shortness of breath 06/13/13   Alonza Bogus, MD  albuterol (PROVENTIL) (2.5 MG/3ML) 0.083% nebulizer solution Take 2.5 mg by nebulization 4 (four) times daily.    Historical Provider, MD  ALPRAZolam [name]) 1 MG tablet Take 1 mg by mouth 3 (three) times daily as needed for anxiety.    Historical Provider, MD  aspirin 325 MG tablet Take 1 tablet (325 mg total)  by mouth daily. 06/13/13   Alonza Bogus, MD  atorvastatin (LIPITOR) 80 MG tablet Take 1 tablet (80 mg total) by mouth daily at 6 PM. 01/04/14   Alonza Bogus, MD  baclofen (LIORESAL) 10 MG tablet Take 5 mg by mouth 3 (three) times daily.    Historical Provider, MD  cholecalciferol (VITAMIN D) 1000 UNITS tablet Take 1,000 Units by mouth daily.    Historical Provider, MD  clopidogrel (PLAVIX) 75 MG tablet Take 1 tablet (75 mg total) by mouth daily with breakfast. 01/04/14   Alonza Bogus, MD  Cranberry 450 MG CAPS Take 1 capsule by mouth daily.    Historical Provider, MD  dextrose 5 % SOLN 50 mL with ceFEPIme 1 G SOLR 1 g Inject 1 g into the vein every 8 (eight) hours. 01/04/14   Alonza Bogus, MD  furosemide (LASIX) 20 MG tablet Take 20 mg by mouth daily.    Historical Provider, MD  guaiFENesin (MUCINEX) 600 MG 12 hr tablet Take 600 mg by mouth 2 (two) times daily.     Historical Provider, MD  levothyroxine (SYNTHROID, LEVOTHROID) 88 MCG tablet Take 1 tablet (88 mcg total) by mouth daily before breakfast. 06/13/13   Alonza Bogus, MD  loratadine (CLARITIN) 10 MG tablet Take 10 mg by mouth daily.    Historical Provider, MD  losartan (COZAAR) 25 MG tablet Take 1 tablet (25 mg total) by mouth daily. 03/01/14   Herminio Commons, MD  nitroGLYCERIN (NITROSTAT) 0.4 MG SL tablet Place 1 tablet (0.4 mg total) under the tongue every 5 (five) minutes as needed for chest pain. 06/13/13   Alonza Bogus, MD  nystatin (MYCOSTATIN) powder Apply 1 g topically 2 (two) times daily.    Historical Provider, MD  pantoprazole (PROTONIX) 40 MG tablet Take 1 tablet (40 mg total) by mouth daily. 06/13/13   Alonza Bogus, MD  polyethylene glycol [facility] / Floria Raveling) packet Take 17 g by mouth daily.    Historical Provider, MD  potassium chloride SA (K-DUR,KLOR-CON) 20 MEQ tablet Take 10 mEq by mouth once.  01/04/14   Alonza Bogus, MD  predniSONE (DELTASONE) 10 MG tablet Take 5 mg by mouth daily with breakfast. Take 4 daily for 3 days, then 3 daily for 3 days, then 2 daily for 3 days, then one daily for 3 days and then one half daily indefinitely 01/04/14   Alonza Bogus, MD  saccharomyces boulardii (FLORASTOR) 250 MG capsule Take 250 mg by mouth 2 (two) times daily.    Historical Provider, MD  sertraline (ZOLOFT) 100 MG tablet Take 1 tablet (100 mg total) by mouth daily. 08/13/13   Alonza Bogus, MD  sodium chloride 0.9 % injection 10-40 mLs by Intracatheter route as needed (flush). 01/04/14   Alonza Bogus, MD  traMADol (ULTRAM) 50 MG tablet Take 50 mg by mouth every 6 (six) hours as needed for moderate pain.    Historical Provider, MD  vancomycin (VANCOCIN) 1 GM/200ML SOLN Inject 200 mLs (1,000 mg total) into the vein every 12 (twelve) hours. 01/04/14   Alonza Bogus, MD  zolpidem (AMBIEN) 5 MG tablet Take 1 tablet (5 mg total) by mouth at bedtime. 08/13/13   Alonza Bogus, MD    Allergies:   Allergies  Allergen Reactions  . Naproxen Other (See Comments)    unknown    Social History:  reports that she has never smoked. She has never used smokeless tobacco. She reports that  she does not drink alcohol or use illicit drugs.  Family History: Family History  Problem Relation Age of Onset  . Cancer    . Diabetes    . Arthritis    . Asthma    . Heart attack Father   . Heart attack Mother     Physical Exam: Filed Vitals:   06/13/14 2115 06/13/14 2130 06/13/14 2300 06/13/14 2341  BP: 112/73 119/77 101/76 107/91  Pulse: 75 78 79 75  Temp:      TempSrc:      Resp: 20 16 17 17   Height:      Weight:      SpO2: 92% 91% 96%    General appearance: alert, cooperative and no distress Head: Normocephalic, without obvious abnormality, atraumatic Eyes: negative Nose: Nares normal. Septum midline. Mucosa normal. No drainage or sinus tenderness. Neck: no JVD and supple, symmetrical, trachea midline Lungs: clear to auscultation bilaterally Heart: regular rate and rhythm, S1, S2 normal, no murmur, click, rub or gallop Abdomen: soft, non-tender; bowel sounds normal; no masses,  no organomegaly Extremities: extremities normal, atraumatic, no cyanosis or edema Pulses: 2+ and symmetric Skin: Skin color, texture, turgor normal. No rashes or lesions   Labs on Admission:   Recent Labs  06/13/14 1840  NA 141  K 4.4  CL 94*  CO2 45*  GLUCOSE 99  BUN 16  CREATININE 0.45*  CALCIUM 8.6    Recent Labs  06/13/14 2007  AST 14  ALT 10  ALKPHOS 83  BILITOT 0.4  PROT 6.1  ALBUMIN 3.3*    Recent Labs  06/13/14 2007  LIPASE 28    Recent Labs  06/13/14 1840  WBC 5.9  NEUTROABS 4.2  HGB 11.0*  HCT 37.3  MCV 97.1  PLT 165    Recent Labs  06/13/14 2007  TROPONINI <0.30   Radiological Exams on Admission: Ct Head Wo Contrast  06/13/2014   CLINICAL DATA:  Left arm pain.  History of stroke.  No known injury.  EXAM: CT HEAD WITHOUT  CONTRAST  TECHNIQUE: Contiguous axial images were obtained from the base of the skull through the vertex without intravenous contrast.  COMPARISON:  06/27/2012  FINDINGS: Technically limited study due to motion artifact. Focal cystic area of encephalomalacia in the right basal ganglion, likely representing old lacunar infarct. This is unchanged since prior study. No new focal lesions are demonstrated. No mass effect or midline shift. No abnormal extra-axial fluid collections. Gray-white matter junctions are distinct. Basal cisterns are not effaced. No ventricular dilatation. No evidence of acute intracranial hemorrhage. No depressed skull fractures. Opacification and/or mucosal thickening demonstrated in both maxillary antra, the right ethmoid air cells and sphenoid sinus, and the left frontal sinus. Mastoid air cells are not opacified. Degenerative changes noted in the upper cervical spine with evidence of fragmentation and mild basilar invagination, likely chronic. Changes are stable since previous cervical spine series from 04/16/2010.  IMPRESSION: No acute intracranial abnormalities. Probable old lacunar infarct in the right basal ganglion. Chronic inflammatory changes in the paranasal sinuses. Chronic degenerative changes in the upper cervical spine.   Electronically Signed   By: Lucienne Capers M.D.   On: 06/13/2014 21:21   Dg Chest Portable 1 View  06/13/2014   CLINICAL DATA:  Left arm pain  EXAM: PORTABLE CHEST - 1 VIEW  COMPARISON:  05/27/2014, 01/02/2014  FINDINGS: Bilateral diffuse mild interstitial thickening. Prominence of the central pulmonary vasculature. No pleural effusion or pneumothorax. Stable cardiomegaly. Unremarkable osseous structures.  IMPRESSION:  Mild bilateral interstitial thickening likely chronic. Mild superimposed interstitial edema or infection cannot be excluded.   Electronically Signed   By: Kathreen Devoid   On: 06/13/2014 19:39   Assessment/Plan  73 yo female with acute on  chronic respiratory failure with hypoxia and hypercapnea  Principal Problem:   Acute and chronic respiratory failure with hypercapnia-  With some encephalopathy seems to have resolved now with brief bipap.  Suspect due to her oxygen being off for 30 minutes at SNF.  Cont stepdown overnight.  Will take of bipap, as she is mentating well and wishes to be taken off of it.  Suspect she chronically retains co2.  No abx at this time.  Need to clarify her home meds, will pulse dose with iv steroids at this time, it is not clear what steroids she is on chronically if any.  pcp is dr Luan Pulling.  No evidence of infection at this time.  Active Problems:  Stable unless o/w noted   Morbid obesity   Cerebral palsy   Asthma   Hypertension   Chronic steroid use   Chronic respiratory failure with hypoxia   Anxiety state, unspecified   Encephalopathy, metabolic   Respiratory failure  Pt is DNR/I.  Already back to baseline status.  Martise Waddell A 06/14/2014, 12:16 AM

## 2014-06-14 NOTE — Progress Notes (Signed)
MRSA + PCR. Protocol initiated.

## 2014-06-14 NOTE — Care Management Utilization Note (Signed)
UR completed 

## 2014-06-14 NOTE — Progress Notes (Addendum)
INITIAL NUTRITION ASSESSMENT  DOCUMENTATION CODES Per approved criteria  -Obesity Unspecified   INTERVENTION: Continue with current nutrition plan of care Follow for diet advancement (home diet is mechanical soft, thin liquids)  NUTRITION DIAGNOSIS: Increased protein energy needs related to wound healing as evidenced by stage II pressure ulcers on rt and lt buttocks.   Goal: Pt will meet >90% of estimated nutritional needs  Monitor:  Diet advancement, PO/supplement intake, labs, skin assessments, weight changes, I/O's  Reason for Assessment: MST=2  73 y.o. female  Admitting Dx: Acute and chronic respiratory failure with hypercapnia  ASSESSMENT: Pt was admitted with acute respiratory failure with hypercapnia after being sent to the ED from [facility] for decreased O2 saturations. Pt was initially placed on BiPap, but is currently on a nasal canula due to improved respiratory status.  RN reports pt has returned to baseline. Noted orders for 30 ml Prostat BID (which provides 200 kcals and 30 grams of protein)- RN reports pt takes very well.  Pt reports good appetite currently and PTA. She ate her pudding, jello, and soup for lunch today. She confirms that she is on a mechanical soft diet with thin liquids at [facility]. Pt reports some intentional weight loss over the past year with recent gain "because I can't stay away from those candies and cookies".  Educated pt on importance of good PO intake to support healing, particularly with increased nutritional needs due to pressure ulcers. Discussed examples of high protein and nutrient dense foods available to her to support wound healing. Encouraged pt to continue to continue to take Prostat supplement and to decrease low energy dense foods in her diet to help assist with her weight loss efforts. Pt very grateful for RD visit.   Height: Ht Readings from Last 1 Encounters:  06/14/14 5\' 5"  (1.651 m)    Weight: Wt Readings from Last 1  Encounters:  06/14/14 224 lb 6.9 oz (101.8 kg)    Ideal Body Weight: 125#  % Ideal Body Weight: 187%  Wt Readings from Last 10 Encounters:  06/14/14 224 lb 6.9 oz (101.8 kg)  05/29/14 220 lb (99.791 kg)  03/01/14 214 lb (97.07 kg)  01/22/14 204 lb (92.534 kg)  01/02/14 221 lb 1.9 oz (100.3 kg)  08/09/13 224 lb 13.9 oz (102 kg)  08/06/13 235 lb (106.595 kg)  06/13/13 234 lb 11.2 oz (106.459 kg)  05/25/13 231 lb (104.781 kg)  11/10/12 229 lb 12.8 oz (104.237 kg)    Usual Body Weight: 220#  % Usual Body Weight: 102%  BMI:  Body mass index is 37.35 kg/(m^2). Obesity, class II  Estimated Nutritional Needs: Kcal: 1600-1800 Protein: 127-153 grams Fluid: 1.6-1.8 L  Skin: stage II pressure ulcers on rt and lt buttocks, moisture associated skin damage on lt and rt buttocks and inner lt thigh, ecchymosis on rt arm and lt leg  Diet Order: Full Liquid  EDUCATION NEEDS: -Education needs addressed   Intake/Output Summary (Last 24 hours) at 06/14/14 1421 Last data filed at 06/14/14 1100  Gross per 24 hour  Intake 193.83 ml  Output    150 ml  Net  43.83 ml    Last BM: PTA   Labs:   Recent Labs Lab 06/13/14 1840 06/14/14 0452  NA 141 145  K 4.4 4.2  CL 94* 96  CO2 45* 44*  BUN 16 16  CREATININE 0.45* 0.34*  CALCIUM 8.6 9.1  GLUCOSE 99 81    CBG (last 3)  No results found for this basename:  GLUCAP,  in the last 72 hours  Scheduled Meds: . albuterol  2.5 mg Nebulization Q6H  . antiseptic oral rinse  15 mL Mouth Rinse q12n4p  . aspirin  325 mg Oral Daily  . atorvastatin  80 mg Oral q1800  . baclofen  5 mg Oral TID  . chlorhexidine  15 mL Mouth Rinse BID  . Chlorhexidine Gluconate Cloth  6 each Topical Q0600  . cholecalciferol  1,000 Units Oral Daily  . clopidogrel  75 mg Oral Q breakfast  . enoxaparin (LOVENOX) injection  40 mg Subcutaneous Q24H  . feeding supplement (PRO-STAT SUGAR FREE 64)  30 mL Oral BID WC  . furosemide  40 mg Intravenous Daily  .  guaiFENesin  600 mg Oral BID  . levothyroxine  88 mcg Oral QAC breakfast  . loratadine  10 mg Oral Daily  . losartan  25 mg Oral Daily  . methylPREDNISolone (SOLU-MEDROL) injection  40 mg Intravenous Q6H  . mupirocin ointment  1 application Nasal BID  . pantoprazole  40 mg Oral Daily  . polyethylene glycol  17 g Oral Daily  . saccharomyces boulardii  250 mg Oral BID  . sertraline  100 mg Oral Daily  . sodium chloride  3 mL Intravenous Q12H    Continuous Infusions:   Past Medical History  Diagnosis Date  . Cerebral palsy   . Asthma   . Seasonal allergies   . Thyroid disease   . HTN (hypertension)   . Anxiety   . Arthritis   . Breast cancer     Right breast, infiltrating ductal.  . Anginal pain   . Osteoporosis 05/08/2012  . Osteoporosis 05/08/2012  . Stroke   . Angina at rest   . Chronic steroid use   . Pneumonia 09/2013  . Dysphagia   . Hypopotassemia   . Respiratory failure   . Generalized weakness   . Urinary retention   . Dysphagia   . Acute MI   . Hypopotassemia   . Sepsis   . Neurogenic bladder   . Reflux   . Obesity     Past Surgical History  Procedure Laterality Date  . Breast lumpectomy    . Radical abdominal hysterectomy    . Dental extraction    . Right hand surgery      Jenifer A. Jimmye Norman, RD, LDN Pager: [phone]

## 2014-06-14 NOTE — Progress Notes (Signed)
CRITICAL VALUE ALERT  Critical value received:  CO2 44  Date of notification:  06/14/2014  Time of notification:  282  Critical value read back:Yes.    Nurse who received alert:  Karlton Lemon, RN  MD notified (1st page):  R Shanon Brow   Time of first page:  0621  MD notified (2nd page):  Time of second page:  Responding MD:    Time MD responded:    Text page sent. No new orders received.

## 2014-06-15 LAB — BLOOD GAS, ARTERIAL
Acid-Base Excess: 18.4 mmol/L — ABNORMAL HIGH (ref 0.0–2.0)
BICARBONATE: 44.6 meq/L — AB (ref 20.0–24.0)
Drawn by: 23534
O2 Content: 3 L/min
O2 Saturation: 94.1 %
PH ART: 7.386 (ref 7.350–7.450)
PO2 ART: 70.4 mmHg — AB (ref 80.0–100.0)
Patient temperature: 37
TCO2: 40.9 mmol/L (ref 0–100)
pCO2 arterial: 76 mmHg (ref 35.0–45.0)

## 2014-06-15 NOTE — Progress Notes (Signed)
Pt transferred to floor from [facility]. Pt is alert and oriented and in no acute distress, VS are stable. Will continue to monitor.

## 2014-06-15 NOTE — Progress Notes (Signed)
Subjective: She says she feels better. She's sitting up and eating. She is at her baseline mental status. She did not wear her BiPAP last night I think that may continue to be a problem. She's doing well enough that I don't think she necessarily needs it right now  Objective: Vital signs in last 24 hours: Temp:  [97.6 F (36.4 C)-98.2 F (36.8 C)] 97.9 F (36.6 C) (06/27 0800) Pulse Rate:  [71-88] 78 (06/27 0800) Resp:  [11-24] 18 (06/27 0800) BP: (95-129)/(45-87) 98/57 mmHg (06/27 0800) SpO2:  [87 %-100 %] 94 % (06/27 0800) Weight:  [101.8 kg (224 lb 6.9 oz)] 101.8 kg (224 lb 6.9 oz) (06/27 0500) Weight change: 2.009 kg (4 lb 6.9 oz)    Intake/Output from previous day: 06/26 0701 - 06/27 0700 In: 730 [P.O.:480; I.V.:250] Out: 150 [Urine:150]  PHYSICAL EXAM General appearance: alert, cooperative, mild distress and Chronic choreoathetoid movements related to her cerebral palsy Resp: rhonchi bilaterally Cardio: regular rate and rhythm, S1, S2 normal, no murmur, click, rub or gallop GI: soft, non-tender; bowel sounds normal; no masses,  no organomegaly Extremities: extremities normal, atraumatic, no cyanosis or edema  Lab Results:  Results for orders placed during the hospital encounter of 06/13/14 (from the past 48 hour(s))  CBC WITH DIFFERENTIAL     Status: Abnormal   Collection Time    06/13/14  6:40 PM      Result Value Ref Range   WBC 5.9  4.0 - 10.5 K/uL   RBC 3.84 (*) 3.87 - 5.11 MIL/uL   Hemoglobin 11.0 (*) 12.0 - 15.0 g/dL   HCT 37.3  36.0 - 46.0 %   MCV 97.1  78.0 - 100.0 fL   MCH 28.6  26.0 - 34.0 pg   MCHC 29.5 (*) 30.0 - 36.0 g/dL   RDW 15.3  11.5 - 15.5 %   Platelets 165  150 - 400 K/uL   Neutrophils Relative % 72  43 - 77 %   Neutro Abs 4.2  1.7 - 7.7 K/uL   Lymphocytes Relative 13  12 - 46 %   Lymphs Abs 0.8  0.7 - 4.0 K/uL   Monocytes Relative 9  3 - 12 %   Monocytes Absolute 0.5  0.1 - 1.0 K/uL   Eosinophils Relative 6 (*) 0 - 5 %   Eosinophils  Absolute 0.4  0.0 - 0.7 K/uL   Basophils Relative 0  0 - 1 %   Basophils Absolute 0.0  0.0 - 0.1 K/uL  BASIC METABOLIC PANEL     Status: Abnormal   Collection Time    06/13/14  6:40 PM      Result Value Ref Range   Sodium 141  137 - 147 mEq/L   Potassium 4.4  3.7 - 5.3 mEq/L   Chloride 94 (*) 96 - 112 mEq/L   CO2 45 (*) 19 - 32 mEq/L   Comment: CRITICAL RESULT CALLED TO, READ BACK BY AND VERIFIED WITH:     HEADLEY,J ON 06/13/14 AT 1955 BY LOY,C   Glucose, Bld 99  70 - 99 mg/dL   BUN 16  6 - 23 mg/dL   Creatinine, Ser 0.45 (*) 0.50 - 1.10 mg/dL   Calcium 8.6  8.4 - 10.5 mg/dL   GFR calc non Af Amer >90  >90 mL/min   GFR calc Af Amer >90  >90 mL/min   Comment: (NOTE)     The eGFR has been calculated using the CKD EPI equation.  This calculation has not been validated in all clinical situations.     eGFR's persistently <90 mL/min signify possible Chronic Kidney     Disease.  TROPONIN I     Status: None   Collection Time    06/13/14  8:07 PM      Result Value Ref Range   Troponin I <0.30  <0.30 ng/mL   Comment:            Due to the release kinetics of cTnI,     a negative result within the first hours     of the onset of symptoms does not rule out     myocardial infarction with certainty.     If myocardial infarction is still suspected,     repeat the test at appropriate intervals.  PRO B NATRIURETIC PEPTIDE     Status: Abnormal   Collection Time    06/13/14  8:07 PM      Result Value Ref Range   Pro B Natriuretic peptide (BNP) 172.5 (*) 0 - 125 pg/mL  HEPATIC FUNCTION PANEL     Status: Abnormal   Collection Time    06/13/14  8:07 PM      Result Value Ref Range   Total Protein 6.1  6.0 - 8.3 g/dL   Albumin 3.3 (*) 3.5 - 5.2 g/dL   AST 14  0 - 37 U/L   ALT 10  0 - 35 U/L   Alkaline Phosphatase 83  39 - 117 U/L   Total Bilirubin 0.4  0.3 - 1.2 mg/dL   Bilirubin, Direct <0.2  0.0 - 0.3 mg/dL   Indirect Bilirubin NOT CALCULATED  0.3 - 0.9 mg/dL  LIPASE, BLOOD      Status: None   Collection Time    06/13/14  8:07 PM      Result Value Ref Range   Lipase 28  11 - 59 U/L  BLOOD GAS, ARTERIAL     Status: Abnormal   Collection Time    06/13/14  8:10 PM      Result Value Ref Range   FIO2 0.32     O2 Content 3.0     Delivery systems NASAL CANNULA     pH, Arterial 7.216 (*) 7.350 - 7.450   pCO2 arterial 108.0 (*) 35.0 - 45.0 mmHg   Comment: CRITICAL RESULT CALLED TO, READ BACK BY AND VERIFIED WITH:     Thea Alken AT 2043 BY CHRISTY HALL, RRT, RCP ON 06/13/2014   pO2, Arterial 93.1  80.0 - 100.0 mmHg   Bicarbonate 42.1 (*) 20.0 - 24.0 mEq/L   TCO2 40.1  0 - 100 mmol/L   Acid-Base Excess 14.1 (*) 0.0 - 2.0 mmol/L   O2 Saturation 95.3     Patient temperature 37.0     Collection site LEFT RADIAL     Drawn by C. HALL     Sample type ARTERIAL DRAW     Allens test (pass/fail) PASS  PASS  LACTIC ACID, PLASMA     Status: None   Collection Time    06/13/14  8:25 PM      Result Value Ref Range   Lactic Acid, Venous 0.7  0.5 - 2.2 mmol/L  CULTURE, BLOOD (ROUTINE X 2)     Status: None   Collection Time    06/13/14  9:15 PM      Result Value Ref Range   Specimen Description BLOOD LEFT HAND     Special Requests BOTTLES DRAWN AEROBIC ONLY  [place]     Culture NO GROWTH 2 DAYS     Report Status PENDING    CULTURE, BLOOD (ROUTINE X 2)     Status: None   Collection Time    06/13/14  9:15 PM      Result Value Ref Range   Specimen Description BLOOD LEFT HAND     Special Requests BOTTLES DRAWN AEROBIC AND ANAEROBIC 6CC     Culture NO GROWTH 2 DAYS     Report Status PENDING    BLOOD GAS, ARTERIAL     Status: Abnormal   Collection Time    06/13/14 11:07 PM      Result Value Ref Range   FIO2 0.35     Delivery systems BILEVEL POSITIVE AIRWAY PRESSURE     Rate 12     Inspiratory PAP 14     Expiratory PAP 5     pH, Arterial 7.265 (*) 7.350 - 7.450   pCO2 arterial 95.1 (*) 35.0 - 45.0 mmHg   Comment: CRITICAL RESULT CALLED TO, READ BACK BY AND VERIFIED  WITH:     LORI SHORE,RN BY BRIDGET STOPHEL RRT,RCP ON 06/13/14 AT 23:40.   pO2, Arterial 78.4 (*) 80.0 - 100.0 mmHg   Bicarbonate 41.7 (*) 20.0 - 24.0 mEq/L   TCO2 39.1  0 - 100 mmol/L   Acid-Base Excess 14.4 (*) 0.0 - 2.0 mmol/L   O2 Saturation 94.5     Patient temperature 37.0     Collection site LEFT RADIAL     Drawn by [phone]     Sample type ARTERIAL DRAW     Allens test (pass/fail) PASS  PASS  BLOOD GAS, ARTERIAL     Status: Abnormal   Collection Time    06/14/14  3:30 AM      Result Value Ref Range   FIO2 0.28     Delivery systems NASAL CANNULA     pH, Arterial 7.290 (*) 7.350 - 7.450   pCO2 arterial 95.2 (*) 35.0 - 45.0 mmHg   Comment: CRITICAL RESULT CALLED TO, READ BACK BY AND VERIFIED WITH:     Deanne Coffer, RN BRIDGET STOPHEL RRT,RCP ON 06/14/14 AT 3:24.   pO2, Arterial 58.7 (*) 80.0 - 100.0 mmHg   Bicarbonate 44.3 (*) 20.0 - 24.0 mEq/L   TCO2 41.8  0 - 100 mmol/L   Acid-Base Excess 17.1 (*) 0.0 - 2.0 mmol/L   O2 Saturation 87.8     Patient temperature 37.0     Collection site LEFT RADIAL     Drawn by [phone]     Sample type ARTERIAL DRAW     Allens test (pass/fail) PASS  PASS  BASIC METABOLIC PANEL     Status: Abnormal   Collection Time    06/14/14  4:52 AM      Result Value Ref Range   Sodium 145  137 - 147 mEq/L   Potassium 4.2  3.7 - 5.3 mEq/L   Chloride 96  96 - 112 mEq/L   CO2 44 (*) 19 - 32 mEq/L   Comment: CRITICAL RESULT CALLED TO, READ BACK BY AND VERIFIED WITH:     WAGONER,R AT 6:15AM ON 06/14/14 BY FESTERMAN,C   Glucose, Bld 81  70 - 99 mg/dL   BUN 16  6 - 23 mg/dL   Creatinine, Ser 0.34 (*) 0.50 - 1.10 mg/dL   Calcium 9.1  8.4 - 10.5 mg/dL   GFR calc non Af Amer >90  >90 mL/min   GFR calc Af  Amer >90  >90 mL/min   Comment: (NOTE)     The eGFR has been calculated using the CKD EPI equation.     This calculation has not been validated in all clinical situations.     eGFR's persistently <90 mL/min signify possible Chronic Kidney     Disease.  CBC      Status: Abnormal   Collection Time    06/14/14  4:52 AM      Result Value Ref Range   WBC 6.3  4.0 - 10.5 K/uL   RBC 3.93  3.87 - 5.11 MIL/uL   Hemoglobin 11.2 (*) 12.0 - 15.0 g/dL   HCT 38.4  36.0 - 46.0 %   MCV 97.7  78.0 - 100.0 fL   MCH 28.5  26.0 - 34.0 pg   MCHC 29.2 (*) 30.0 - 36.0 g/dL   RDW 15.1  11.5 - 15.5 %   Platelets 153  150 - 400 K/uL  MRSA PCR SCREENING     Status: Abnormal   Collection Time    06/14/14  6:45 AM      Result Value Ref Range   MRSA by PCR POSITIVE (*) NEGATIVE   Comment:            The GeneXpert MRSA Assay (FDA     approved for NASAL specimens     only), is one component of a     comprehensive MRSA colonization     surveillance program. It is not     intended to diagnose MRSA     infection nor to guide or     monitor treatment for     MRSA infections.     RESULT CALLED TO, READ BACK BY AND VERIFIED WITH:     NADINE ROWE RN ON [id number] AT 1100 BY RESSEGGER R  BLOOD GAS, ARTERIAL     Status: Abnormal   Collection Time    06/14/14  8:00 AM      Result Value Ref Range   O2 Content 3.0     Delivery systems NASAL CANNULA     pH, Arterial 7.312 (*) 7.350 - 7.450   pCO2 arterial 87.9 (*) 35.0 - 45.0 mmHg   Comment: CRITICAL RESULT CALLED TO, READ BACK BY AND VERIFIED WITH:     NEDINE ROWE RN BY JESSICA HALEY RRT RCP ON 06/14/2014 AT 0820   pO2, Arterial 71.4 (*) 80.0 - 100.0 mmHg   Bicarbonate 43.2 (*) 20.0 - 24.0 mEq/L   TCO2 40.2  0 - 100 mmol/L   Acid-Base Excess 16.4 (*) 0.0 - 2.0 mmol/L   O2 Saturation 93.8     Patient temperature 37.0     Collection site LEFT RADIAL     Drawn by [id number]     Sample type ARTERIAL DRAW     Allens test (pass/fail) PASS  PASS  URINALYSIS, ROUTINE W REFLEX MICROSCOPIC     Status: Abnormal   Collection Time    06/14/14  9:25 AM      Result Value Ref Range   Color, Urine YELLOW  YELLOW   APPearance CLEAR  CLEAR   Specific Gravity, Urine 1.015  1.005 - 1.030   pH 5.5  5.0 - 8.0   Glucose, UA NEGATIVE   NEGATIVE mg/dL   Hgb urine dipstick LARGE (*) NEGATIVE   Bilirubin Urine NEGATIVE  NEGATIVE   Ketones, ur NEGATIVE  NEGATIVE mg/dL   Protein, ur TRACE (*) NEGATIVE mg/dL   Urobilinogen, UA 0.2  0.0 - 1.0  mg/dL   Nitrite POSITIVE (*) NEGATIVE   Leukocytes, UA MODERATE (*) NEGATIVE  URINE MICROSCOPIC-ADD ON     Status: Abnormal   Collection Time    06/14/14  9:25 AM      Result Value Ref Range   WBC, UA TOO NUMEROUS TO COUNT  <3 WBC/hpf   RBC / HPF TOO NUMEROUS TO COUNT  <3 RBC/hpf   Bacteria, UA MANY (*) RARE  BLOOD GAS, ARTERIAL     Status: Abnormal   Collection Time    06/15/14  5:45 AM      Result Value Ref Range   O2 Content 3.0     Delivery systems NASAL CANNULA     pH, Arterial 7.386  7.350 - 7.450   pCO2 arterial 76.0 (*) 35.0 - 45.0 mmHg   Comment: CRITICAL RESULT CALLED TO, READ BACK BY AND VERIFIED WITH:     TRACIE NELSON,RN ON 06/15/2014 AT 0601 BY PEVIANY LAWSON,RRT.   pO2, Arterial 70.4 (*) 80.0 - 100.0 mmHg   Bicarbonate 44.6 (*) 20.0 - 24.0 mEq/L   TCO2 40.9  0 - 100 mmol/L   Acid-Base Excess 18.4 (*) 0.0 - 2.0 mmol/L   O2 Saturation 94.1     Patient temperature 37.0     Collection site LEFT RADIAL     Drawn by [phone]     Sample type ARTERIAL     Allens test (pass/fail) PASS  PASS    ABGS  Recent Labs  06/15/14 0545  PHART 7.386  PO2ART 70.4*  TCO2 40.9  HCO3 44.6*   CULTURES Recent Results (from the past 240 hour(s))  CULTURE, BLOOD (ROUTINE X 2)     Status: None   Collection Time    06/13/14  9:15 PM      Result Value Ref Range Status   Specimen Description BLOOD LEFT HAND   Final   Special Requests BOTTLES DRAWN AEROBIC ONLY 6CC   Final   Culture NO GROWTH 2 DAYS   Final   Report Status PENDING   Incomplete  CULTURE, BLOOD (ROUTINE X 2)     Status: None   Collection Time    06/13/14  9:15 PM      Result Value Ref Range Status   Specimen Description BLOOD LEFT HAND   Final   Special Requests BOTTLES DRAWN AEROBIC AND ANAEROBIC 6CC   Final    Culture NO GROWTH 2 DAYS   Final   Report Status PENDING   Incomplete  MRSA PCR SCREENING     Status: Abnormal   Collection Time    06/14/14  6:45 AM      Result Value Ref Range Status   MRSA by PCR POSITIVE (*) NEGATIVE Final   Comment:            The GeneXpert MRSA Assay (FDA     approved for NASAL specimens     only), is one component of a     comprehensive MRSA colonization     surveillance program. It is not     intended to diagnose MRSA     infection nor to guide or     monitor treatment for     MRSA infections.     RESULT CALLED TO, READ BACK BY AND VERIFIED WITH:     NADINE ROWE RN ON [id number] AT 1100 BY RESSEGGER R   Studies/Results: Ct Head Wo Contrast  06/13/2014   CLINICAL DATA:  Left arm pain.  History of stroke.  No known  injury.  EXAM: CT HEAD WITHOUT CONTRAST  TECHNIQUE: Contiguous axial images were obtained from the base of the skull through the vertex without intravenous contrast.  COMPARISON:  06/27/2012  FINDINGS: Technically limited study due to motion artifact. Focal cystic area of encephalomalacia in the right basal ganglion, likely representing old lacunar infarct. This is unchanged since prior study. No new focal lesions are demonstrated. No mass effect or midline shift. No abnormal extra-axial fluid collections. Gray-white matter junctions are distinct. Basal cisterns are not effaced. No ventricular dilatation. No evidence of acute intracranial hemorrhage. No depressed skull fractures. Opacification and/or mucosal thickening demonstrated in both maxillary antra, the right ethmoid air cells and sphenoid sinus, and the left frontal sinus. Mastoid air cells are not opacified. Degenerative changes noted in the upper cervical spine with evidence of fragmentation and mild basilar invagination, likely chronic. Changes are stable since previous cervical spine series from 04/16/2010.  IMPRESSION: No acute intracranial abnormalities. Probable old lacunar infarct in the right  basal ganglion. Chronic inflammatory changes in the paranasal sinuses. Chronic degenerative changes in the upper cervical spine.   Electronically Signed   By: Lucienne Capers M.D.   On: 06/13/2014 21:21   Dg Chest Port 1 View  06/14/2014   CLINICAL DATA:  Respiratory failure  EXAM: PORTABLE CHEST - 1 VIEW  COMPARISON:  06/13/2014  FINDINGS: Bilateral diffuse interstitial thickening. Possible trace bilateral pleural effusions. No pneumothorax. Stable cardiomediastinal silhouette. Unremarkable osseous structures.  IMPRESSION: Bilateral diffuse interstitial thickening again noted which is likely chronic. Superimposed mild pulmonary edema or infection is not excluded.   Electronically Signed   By: Kathreen Devoid   On: 06/14/2014 09:40   Dg Chest Portable 1 View  06/13/2014   CLINICAL DATA:  Left arm pain  EXAM: PORTABLE CHEST - 1 VIEW  COMPARISON:  05/27/2014, 01/02/2014  FINDINGS: Bilateral diffuse mild interstitial thickening. Prominence of the central pulmonary vasculature. No pleural effusion or pneumothorax. Stable cardiomegaly. Unremarkable osseous structures.  IMPRESSION: Mild bilateral interstitial thickening likely chronic. Mild superimposed interstitial edema or infection cannot be excluded.   Electronically Signed   By: Kathreen Devoid   On: 06/13/2014 19:39    Medications:  Prior to Admission:  Prescriptions prior to admission  Medication Sig Dispense Refill  . Amino Acids-Protein Hydrolys (FEEDING SUPPLEMENT, PRO-STAT SUGAR FREE 64,) LIQD Take 30 mLs by mouth 2 (two) times daily with a meal.      . temazepam (RESTORIL) 7.5 MG capsule Take 7.5 mg by mouth at bedtime as needed for sleep.      Marland Kitchen acetaminophen (TYLENOL) 325 MG tablet Take 2 tablets (650 mg total) by mouth every 6 (six) hours as needed for mild pain (or Fever >/= 101).      Marland Kitchen acidophilus (RISAQUAD) CAPS capsule Take 1 capsule by mouth daily.      Marland Kitchen albuterol (PROVENTIL HFA;VENTOLIN HFA) 108 (90 BASE) MCG/ACT inhaler Inhale 2 puffs  into the lungs every 4 (four) hours as needed. Shortness of breath  1 Inhaler  12  . albuterol (PROVENTIL) (2.5 MG/3ML) 0.083% nebulizer solution Take 2.5 mg by nebulization 4 (four) times daily.      Marland Kitchen ALPRAZolam (XANAX) 1 MG tablet Take 1 mg by mouth 3 (three) times daily as needed for anxiety.      Marland Kitchen aspirin 325 MG tablet Take 1 tablet (325 mg total) by mouth daily.  30 tablet  12  . atorvastatin (LIPITOR) 80 MG tablet Take 1 tablet (80 mg total) by mouth daily at  6 PM.      . baclofen (LIORESAL) 10 MG tablet Take 5 mg by mouth 3 (three) times daily.      . cholecalciferol (VITAMIN D) 1000 UNITS tablet Take 1,000 Units by mouth daily.      . clopidogrel (PLAVIX) 75 MG tablet Take 1 tablet (75 mg total) by mouth daily with breakfast.      . Cranberry 450 MG CAPS Take 1 capsule by mouth daily.      Marland Kitchen dextrose 5 % SOLN 50 mL with ceFEPIme 1 G SOLR 1 g Inject 1 g into the vein every 8 (eight) hours.      . furosemide (LASIX) 20 MG tablet Take 20 mg by mouth daily.      Marland Kitchen guaiFENesin (MUCINEX) 600 MG 12 hr tablet Take 600 mg by mouth 2 (two) times daily.      Marland Kitchen levothyroxine (SYNTHROID, LEVOTHROID) 88 MCG tablet Take 1 tablet (88 mcg total) by mouth daily before breakfast.  30 tablet  12  . loratadine (CLARITIN) 10 MG tablet Take 10 mg by mouth daily.      Marland Kitchen losartan (COZAAR) 25 MG tablet Take 1 tablet (25 mg total) by mouth daily.  90 tablet  3  . nitroGLYCERIN (NITROSTAT) 0.4 MG SL tablet Place 1 tablet (0.4 mg total) under the tongue every 5 (five) minutes as needed for chest pain.  25 tablet  12  . nystatin (MYCOSTATIN) powder Apply 1 g topically 2 (two) times daily.      . pantoprazole (PROTONIX) 40 MG tablet Take 1 tablet (40 mg total) by mouth daily.  30 tablet  12  . polyethylene glycol (MIRALAX / GLYCOLAX) packet Take 17 g by mouth daily.      . potassium chloride SA (K-DUR,KLOR-CON) 20 MEQ tablet Take 10 mEq by mouth once.       . predniSONE (DELTASONE) 10 MG tablet Take 5 mg by mouth  daily with breakfast. Take 4 daily for 3 days, then 3 daily for 3 days, then 2 daily for 3 days, then one daily for 3 days and then one half daily indefinitely      . saccharomyces boulardii (FLORASTOR) 250 MG capsule Take 250 mg by mouth 2 (two) times daily.      . sertraline (ZOLOFT) 100 MG tablet Take 1 tablet (100 mg total) by mouth daily.      . sodium chloride 0.9 % injection 10-40 mLs by Intracatheter route as needed (flush).  5 mL    . traMADol (ULTRAM) 50 MG tablet Take 50 mg by mouth every 6 (six) hours as needed for moderate pain.      . vancomycin (VANCOCIN) 1 GM/200ML SOLN Inject 200 mLs (1,000 mg total) into the vein every 12 (twelve) hours.  4000 mL    . zolpidem (AMBIEN) 5 MG tablet Take 1 tablet (5 mg total) by mouth at bedtime.  30 tablet  0   Scheduled: . albuterol  2.5 mg Nebulization Q6H  . antiseptic oral rinse  15 mL Mouth Rinse q12n4p  . aspirin  325 mg Oral Daily  . atorvastatin  80 mg Oral q1800  . baclofen  5 mg Oral TID  . chlorhexidine  15 mL Mouth Rinse BID  . Chlorhexidine Gluconate Cloth  6 each Topical Q0600  . cholecalciferol  1,000 Units Oral Daily  . clopidogrel  75 mg Oral Q breakfast  . enoxaparin (LOVENOX) injection  40 mg Subcutaneous Q24H  . feeding supplement (PRO-STAT SUGAR FREE  64)  30 mL Oral BID WC  . furosemide  40 mg Intravenous Daily  . guaiFENesin  600 mg Oral BID  . levothyroxine  88 mcg Oral QAC breakfast  . loratadine  10 mg Oral Daily  . losartan  25 mg Oral Daily  . methylPREDNISolone (SOLU-MEDROL) injection  40 mg Intravenous Q6H  . mupirocin ointment  1 application Nasal BID  . pantoprazole  40 mg Oral Daily  . polyethylene glycol  17 g Oral Daily  . saccharomyces boulardii  250 mg Oral BID  . sertraline  100 mg Oral Daily  . sodium chloride  3 mL Intravenous Q12H   Continuous:  MBP:JPETKK chloride, acetaminophen, ALPRAZolam, nitroGLYCERIN, sodium chloride, sodium chloride, temazepam, traMADol  Assesment: She was admitted  with acute on chronic respiratory failure with fairly marked hypercapnia. This is related to COPD, cerebral palsy, morbid obesity with obesity hypoventilation. She was acutely encephalopathic related to the elevated CO2 and that has improved markedly. She had a myocardial infarction about 3 months ago. She has not had any chest pain. I think she probably has some excess fluid and received Lasix Principal Problem:   Acute and chronic respiratory failure with hypercapnia Active Problems:   Morbid obesity   Cerebral palsy   Asthma   Hypertension   Chronic steroid use   Chronic respiratory failure with hypoxia   Anxiety state, unspecified   Encephalopathy, metabolic   Respiratory failure    Plan: Continue current treatments. She can transfer from the ICU.    LOS: 2 days   Ajai Terhaar L 06/15/2014, 8:37 AM

## 2014-06-15 NOTE — Progress Notes (Signed)
Patient transferred to room 318. Report given to Penni Homans RN. Vital signs stable at transfer.

## 2014-06-15 NOTE — Progress Notes (Signed)
Respiratory Care note: The pt attempted to wear BIPAP; however, due to the pt's uncontrol facial movements a proper seal could not be obtained. I attempted use a different mask, but was still unable to get a good seal. The pt also had a coughing episode, coughing up white frothy foamy secretions. Placed the pt back on her 3 liter nasal cannula.

## 2014-06-15 NOTE — Progress Notes (Addendum)
Pt has skin breakdown on her bottom and on inner thighs. Pt is incontinent of bowel and bladder and skin has gotten increasingly more red, excoriated, and painful for pt. MD is aware and ordered foley insertion to allow for healing of skin. Will continue to monitor.

## 2014-06-16 MED ORDER — ALBUTEROL SULFATE (2.5 MG/3ML) 0.083% IN NEBU
2.5000 mg | INHALATION_SOLUTION | Freq: Three times a day (TID) | RESPIRATORY_TRACT | Status: DC
  Administered 2014-06-16 – 2014-06-17 (×2): 2.5 mg via RESPIRATORY_TRACT
  Filled 2014-06-16 (×2): qty 3

## 2014-06-16 MED ORDER — PREDNISONE 20 MG PO TABS
40.0000 mg | ORAL_TABLET | Freq: Every day | ORAL | Status: DC
  Administered 2014-06-16 – 2014-06-17 (×2): 40 mg via ORAL
  Filled 2014-06-16 (×2): qty 2

## 2014-06-16 MED ORDER — FUROSEMIDE 40 MG PO TABS
40.0000 mg | ORAL_TABLET | Freq: Every day | ORAL | Status: DC
  Administered 2014-06-16 – 2014-06-17 (×2): 40 mg via ORAL
  Filled 2014-06-16 (×2): qty 1

## 2014-06-16 NOTE — Progress Notes (Signed)
Subjective: She feels and looks better. She says she has less trouble. She does have excoriated areas in her inner thighs and on her sacrum so she had Foley catheter placed. Her breathing is better. She is coughing and coughing up some clear sputum  Objective: Vital signs in last 24 hours: Temp:  [98.1 F (36.7 C)-100.3 F (37.9 C)] 98.5 F (36.9 C) (06/28 0530) Pulse Rate:  [75-93] 83 (06/28 0530) Resp:  [18] 18 (06/28 0530) BP: (109-134)/(67-89) 109/67 mmHg (06/28 0530) SpO2:  [91 %-95 %] 95 % (06/28 0719) Weight change:  Last BM Date: 06/15/14  Intake/Output from previous day: 06/27 0701 - 06/28 0700 In: 873 [P.O.:600; I.V.:23] Out: 600 [Urine:600]  PHYSICAL EXAM General appearance: alert, cooperative, mild distress and With continued choreoathetoid movements from her cerebral palsy Resp: rhonchi bilaterally Cardio: regular rate and rhythm, S1, S2 normal, no murmur, click, rub or gallop GI: soft, non-tender; bowel sounds normal; no masses,  no organomegaly Extremities: She has excoriated areas on the inner thighs bilaterally  Lab Results:  Results for orders placed during the hospital encounter of 06/13/14 (from the past 48 hour(s))  BLOOD GAS, ARTERIAL     Status: Abnormal   Collection Time    06/15/14  5:45 AM      Result Value Ref Range   O2 Content 3.0     Delivery systems NASAL CANNULA     pH, Arterial 7.386  7.350 - 7.450   pCO2 arterial 76.0 (*) 35.0 - 45.0 mmHg   Comment: CRITICAL RESULT CALLED TO, READ BACK BY AND VERIFIED WITH:     TRACIE NELSON,RN ON 06/15/2014 AT 0601 BY PEVIANY LAWSON,RRT.   pO2, Arterial 70.4 (*) 80.0 - 100.0 mmHg   Bicarbonate 44.6 (*) 20.0 - 24.0 mEq/L   TCO2 40.9  0 - 100 mmol/L   Acid-Base Excess 18.4 (*) 0.0 - 2.0 mmol/L   O2 Saturation 94.1     Patient temperature 37.0     Collection site LEFT RADIAL     Drawn by [phone]     Sample type ARTERIAL     Allens test (pass/fail) PASS  PASS    ABGS  Recent Labs  06/15/14 0545   PHART 7.386  PO2ART 70.4*  TCO2 40.9  HCO3 44.6*   CULTURES Recent Results (from the past 240 hour(s))  CULTURE, BLOOD (ROUTINE X 2)     Status: None   Collection Time    06/13/14  9:15 PM      Result Value Ref Range Status   Specimen Description BLOOD LEFT HAND   Final   Special Requests BOTTLES DRAWN AEROBIC ONLY 6CC   Final   Culture NO GROWTH 3 DAYS   Final   Report Status PENDING   Incomplete  CULTURE, BLOOD (ROUTINE X 2)     Status: None   Collection Time    06/13/14  9:15 PM      Result Value Ref Range Status   Specimen Description BLOOD LEFT HAND   Final   Special Requests BOTTLES DRAWN AEROBIC AND ANAEROBIC 6CC   Final   Culture NO GROWTH 3 DAYS   Final   Report Status PENDING   Incomplete  MRSA PCR SCREENING     Status: Abnormal   Collection Time    06/14/14  6:45 AM      Result Value Ref Range Status   MRSA by PCR POSITIVE (*) NEGATIVE Final   Comment:  The GeneXpert MRSA Assay (FDA     approved for NASAL specimens     only), is one component of a     comprehensive MRSA colonization     surveillance program. It is not     intended to diagnose MRSA     infection nor to guide or     monitor treatment for     MRSA infections.     RESULT CALLED TO, READ BACK BY AND VERIFIED WITH:     NADINE ROWE RN ON [id number] AT 3 BY RESSEGGER R  URINE CULTURE     Status: None   Collection Time    06/14/14  9:25 AM      Result Value Ref Range Status   Specimen Description URINE, CLEAN CATCH   Final   Special Requests NONE   Final   Culture  Setup Time     Final   Value: 06/14/2014 22:55     Performed at [facility] PENDING   Incomplete   Culture     Final   Value: Culture reincubated for better growth     Performed at [facility]   Report Status PENDING   Incomplete   Studies/Results: No results found.  Medications:  Prior to Admission:  Prescriptions prior to admission  Medication Sig Dispense Refill  . Amino  Acids-Protein Hydrolys (FEEDING SUPPLEMENT, PRO-STAT SUGAR FREE 64,) LIQD Take 30 mLs by mouth 2 (two) times daily with a meal.      . temazepam (RESTORIL) 7.5 MG capsule Take 7.5 mg by mouth at bedtime as needed for sleep.      Marland Kitchen acetaminophen (TYLENOL) 325 MG tablet Take 2 tablets (650 mg total) by mouth every 6 (six) hours as needed for mild pain (or Fever >/= 101).      Marland Kitchen acidophilus (RISAQUAD) CAPS capsule Take 1 capsule by mouth daily.      Marland Kitchen albuterol (PROVENTIL HFA;VENTOLIN HFA) 108 (90 BASE) MCG/ACT inhaler Inhale 2 puffs into the lungs every 4 (four) hours as needed. Shortness of breath  1 Inhaler  12  . albuterol (PROVENTIL) (2.5 MG/3ML) 0.083% nebulizer solution Take 2.5 mg by nebulization 4 (four) times daily.      Marland Kitchen ALPRAZolam (XANAX) 1 MG tablet Take 1 mg by mouth 3 (three) times daily as needed for anxiety.      Marland Kitchen aspirin 325 MG tablet Take 1 tablet (325 mg total) by mouth daily.  30 tablet  12  . atorvastatin (LIPITOR) 80 MG tablet Take 1 tablet (80 mg total) by mouth daily at 6 PM.      . baclofen (LIORESAL) 10 MG tablet Take 5 mg by mouth 3 (three) times daily.      . cholecalciferol (VITAMIN D) 1000 UNITS tablet Take 1,000 Units by mouth daily.      . clopidogrel (PLAVIX) 75 MG tablet Take 1 tablet (75 mg total) by mouth daily with breakfast.      . Cranberry 450 MG CAPS Take 1 capsule by mouth daily.      Marland Kitchen dextrose 5 % SOLN 50 mL with ceFEPIme 1 G SOLR 1 g Inject 1 g into the vein every 8 (eight) hours.      . furosemide (LASIX) 20 MG tablet Take 20 mg by mouth daily.      Marland Kitchen guaiFENesin (MUCINEX) 600 MG 12 hr tablet Take 600 mg by mouth 2 (two) times daily.      Marland Kitchen levothyroxine (SYNTHROID, LEVOTHROID)  88 MCG tablet Take 1 tablet (88 mcg total) by mouth daily before breakfast.  30 tablet  12  . loratadine (CLARITIN) 10 MG tablet Take 10 mg by mouth daily.      Marland Kitchen losartan (COZAAR) 25 MG tablet Take 1 tablet (25 mg total) by mouth daily.  90 tablet  3  . nitroGLYCERIN  (NITROSTAT) 0.4 MG SL tablet Place 1 tablet (0.4 mg total) under the tongue every 5 (five) minutes as needed for chest pain.  25 tablet  12  . nystatin (MYCOSTATIN) powder Apply 1 g topically 2 (two) times daily.      . pantoprazole (PROTONIX) 40 MG tablet Take 1 tablet (40 mg total) by mouth daily.  30 tablet  12  . polyethylene glycol (MIRALAX / GLYCOLAX) packet Take 17 g by mouth daily.      . potassium chloride SA (K-DUR,KLOR-CON) 20 MEQ tablet Take 10 mEq by mouth once.       . predniSONE (DELTASONE) 10 MG tablet Take 5 mg by mouth daily with breakfast. Take 4 daily for 3 days, then 3 daily for 3 days, then 2 daily for 3 days, then one daily for 3 days and then one half daily indefinitely      . saccharomyces boulardii (FLORASTOR) 250 MG capsule Take 250 mg by mouth 2 (two) times daily.      . sertraline (ZOLOFT) 100 MG tablet Take 1 tablet (100 mg total) by mouth daily.      . sodium chloride 0.9 % injection 10-40 mLs by Intracatheter route as needed (flush).  5 mL    . traMADol (ULTRAM) 50 MG tablet Take 50 mg by mouth every 6 (six) hours as needed for moderate pain.      . vancomycin (VANCOCIN) 1 GM/200ML SOLN Inject 200 mLs (1,000 mg total) into the vein every 12 (twelve) hours.  4000 mL    . zolpidem (AMBIEN) 5 MG tablet Take 1 tablet (5 mg total) by mouth at bedtime.  30 tablet  0   Scheduled: . albuterol  2.5 mg Nebulization Q6H  . antiseptic oral rinse  15 mL Mouth Rinse q12n4p  . aspirin  325 mg Oral Daily  . atorvastatin  80 mg Oral q1800  . baclofen  5 mg Oral TID  . chlorhexidine  15 mL Mouth Rinse BID  . Chlorhexidine Gluconate Cloth  6 each Topical Q0600  . cholecalciferol  1,000 Units Oral Daily  . clopidogrel  75 mg Oral Q breakfast  . enoxaparin (LOVENOX) injection  40 mg Subcutaneous Q24H  . feeding supplement (PRO-STAT SUGAR FREE 64)  30 mL Oral BID WC  . furosemide  40 mg Oral Daily  . guaiFENesin  600 mg Oral BID  . levothyroxine  88 mcg Oral QAC breakfast  .  loratadine  10 mg Oral Daily  . losartan  25 mg Oral Daily  . mupirocin ointment  1 application Nasal BID  . pantoprazole  40 mg Oral Daily  . polyethylene glycol  17 g Oral Daily  . predniSONE  40 mg Oral Q breakfast  . saccharomyces boulardii  250 mg Oral BID  . sertraline  100 mg Oral Daily  . sodium chloride  3 mL Intravenous Q12H   Continuous:  LKG:MWNUUV chloride, acetaminophen, ALPRAZolam, nitroGLYCERIN, sodium chloride, sodium chloride, temazepam, traMADol  Assesment: She was admitted with acute on chronic respiratory failure. She has improved. She was markedly hypercapnic. This is likely related to COPD/asthma morbid obesity and obesity hypoventilation. Principal  Problem:   Acute and chronic respiratory failure with hypercapnia Active Problems:   Morbid obesity   Cerebral palsy   Asthma   Hypertension   Chronic steroid use   Chronic respiratory failure with hypoxia   Anxiety state, unspecified   Encephalopathy, metabolic   Respiratory failure    Plan: She may be ready for discharge tomorrow. I'm going to discontinue IV medications.    LOS: 3 days   HAWKINS,EDWARD L 06/16/2014, 10:03 AM

## 2014-06-17 DIAGNOSIS — N390 Urinary tract infection, site not specified: Secondary | ICD-10-CM | POA: Diagnosis present

## 2014-06-17 DIAGNOSIS — L98491 Non-pressure chronic ulcer of skin of other sites limited to breakdown of skin: Secondary | ICD-10-CM | POA: Diagnosis present

## 2014-06-17 MED ORDER — CIPROFLOXACIN HCL 250 MG PO TABS: 250.0000 mg | ORAL_TABLET | Freq: Two times a day (BID) | ORAL | Status: DC

## 2014-06-17 MED ORDER — MUSCLE RUB 10-15 % EX CREA: TOPICAL_CREAM | CUTANEOUS | Status: DC | PRN

## 2014-06-17 MED ORDER — TROLAMINE SALICYLATE 10 % EX CREA: TOPICAL_CREAM | CUTANEOUS | Status: DC | PRN

## 2014-06-17 MED ORDER — LOPERAMIDE HCL 2 MG PO CAPS
2.0000 mg | ORAL_CAPSULE | ORAL | Status: DC | PRN
  Administered 2014-06-17: 2 mg via ORAL
  Filled 2014-06-17: qty 1

## 2014-06-17 MED ORDER — TROLAMINE SALICYLATE 10 % EX CREA
TOPICAL_CREAM | CUTANEOUS | Status: DC | PRN
  Filled 2014-06-17: qty 85

## 2014-06-17 MED ORDER — LOPERAMIDE HCL 2 MG PO CAPS: 2.0000 mg | ORAL_CAPSULE | ORAL | Status: DC | PRN

## 2014-06-17 NOTE — Discharge Summary (Addendum)
Physician Discharge Summary  Patient ID: [name] MRN: [id number] DOB/AGE: 73/20/1942 73 y.o. Primary Care Physician:HAWKINS,EDWARD L, MD Admit date: 06/13/2014 Discharge date: 06/17/2014    Discharge Diagnoses:   Principal Problem:   Acute and chronic respiratory failure with hypercapnia Active Problems:   Morbid obesity   Cerebral palsy   Asthma   Hypertension   Chronic steroid use   Chronic respiratory failure with hypoxia   Anxiety state, unspecified   Encephalopathy, metabolic   Respiratory failure   Intertriginous skin ulcer, limited to breakdown of skin  UTI    Medication List    ASK your doctor about these medications       acetaminophen 325 MG tablet  Commonly known as:  TYLENOL  Take 2 tablets (650 mg total) by mouth every 6 (six) hours as needed for mild pain (or Fever >/= 101).     albuterol 108 (90 BASE) MCG/ACT inhaler  Commonly known as:  PROVENTIL HFA;VENTOLIN HFA  Inhale 2 puffs into the lungs every 4 (four) hours as needed. Shortness of breath     albuterol (2.5 MG/3ML) 0.083% nebulizer solution  Commonly known as:  PROVENTIL  Take 2.5 mg by nebulization 4 (four) times daily.     ALPRAZolam 1 MG tablet  Commonly known as:  XANAX  Take 1 mg by mouth 3 (three) times daily.     aspirin 325 MG tablet  Take 1 tablet (325 mg total) by mouth daily.     atorvastatin 80 MG tablet  Commonly known as:  LIPITOR  Take 1 tablet (80 mg total) by mouth daily at 6 PM.     baclofen 10 MG tablet  Commonly known as:  LIORESAL  Take 5 mg by mouth 3 (three) times daily.     cholecalciferol 1000 UNITS tablet  Commonly known as:  VITAMIN D  Take 1,000 Units by mouth daily.     clopidogrel 75 MG tablet  Commonly known as:  PLAVIX  Take 1 tablet (75 mg total) by mouth daily with breakfast.     Cranberry 450 MG Caps  Take 1 capsule by mouth daily.     feeding supplement (PRO-STAT SUGAR FREE 64) Liqd  Take 30 mLs by mouth 2 (two) times daily with a  meal.     furosemide 20 MG tablet  Commonly known as:  LASIX  Take 20 mg by mouth daily.     guaiFENesin 600 MG 12 hr tablet  Commonly known as:  MUCINEX  Take 600 mg by mouth 2 (two) times daily.     levothyroxine 88 MCG tablet  Commonly known as:  SYNTHROID, LEVOTHROID  Take 1 tablet (88 mcg total) by mouth daily before breakfast.     loratadine 10 MG tablet  Commonly known as:  CLARITIN  Take 10 mg by mouth daily.     nitroGLYCERIN 0.4 MG SL tablet  Commonly known as:  NITROSTAT  Place 1 tablet (0.4 mg total) under the tongue every 5 (five) minutes as needed for chest pain.     pantoprazole 40 MG tablet  Commonly known as:  PROTONIX  Take 1 tablet (40 mg total) by mouth daily.     polyethylene glycol packet  Commonly known as:  MIRALAX / GLYCOLAX  Take 17 g by mouth daily.     potassium chloride SA 20 MEQ tablet  Commonly known as:  K-DUR,KLOR-CON  Take 10 mEq by mouth once.     predniSONE 5 MG tablet  Commonly known as:  DELTASONE  Take 5 mg by mouth daily with breakfast.     saccharomyces boulardii 250 MG capsule  Commonly known as:  FLORASTOR  Take 250 mg by mouth 2 (two) times daily.     sertraline 100 MG tablet  Commonly known as:  ZOLOFT  Take 1 tablet (100 mg total) by mouth daily.     temazepam 7.5 MG capsule  Commonly known as:  RESTORIL  Take 7.5 mg by mouth at bedtime as needed for sleep.     traMADol 50 MG tablet  Commonly known as:  ULTRAM  Take 50 mg by mouth every 6 (six) hours as needed for moderate pain.        Discharged Condition: Improved    Consults: None  Significant Diagnostic Studies: Ct Head Wo Contrast  06/13/2014   CLINICAL DATA:  Left arm pain.  History of stroke.  No known injury.  EXAM: CT HEAD WITHOUT CONTRAST  TECHNIQUE: Contiguous axial images were obtained from the base of the skull through the vertex without intravenous contrast.  COMPARISON:  06/27/2012  FINDINGS: Technically limited study due to motion artifact.  Focal cystic area of encephalomalacia in the right basal ganglion, likely representing old lacunar infarct. This is unchanged since prior study. No new focal lesions are demonstrated. No mass effect or midline shift. No abnormal extra-axial fluid collections. Gray-white matter junctions are distinct. Basal cisterns are not effaced. No ventricular dilatation. No evidence of acute intracranial hemorrhage. No depressed skull fractures. Opacification and/or mucosal thickening demonstrated in both maxillary antra, the right ethmoid air cells and sphenoid sinus, and the left frontal sinus. Mastoid air cells are not opacified. Degenerative changes noted in the upper cervical spine with evidence of fragmentation and mild basilar invagination, likely chronic. Changes are stable since previous cervical spine series from 04/16/2010.  IMPRESSION: No acute intracranial abnormalities. Probable old lacunar infarct in the right basal ganglion. Chronic inflammatory changes in the paranasal sinuses. Chronic degenerative changes in the upper cervical spine.   Electronically Signed   By: Lucienne Capers M.D.   On: 06/13/2014 21:21   Dg Chest Port 1 View  06/14/2014   CLINICAL DATA:  Respiratory failure  EXAM: PORTABLE CHEST - 1 VIEW  COMPARISON:  06/13/2014  FINDINGS: Bilateral diffuse interstitial thickening. Possible trace bilateral pleural effusions. No pneumothorax. Stable cardiomediastinal silhouette. Unremarkable osseous structures.  IMPRESSION: Bilateral diffuse interstitial thickening again noted which is likely chronic. Superimposed mild pulmonary edema or infection is not excluded.   Electronically Signed   By: Kathreen Devoid   On: 06/14/2014 09:40   Dg Chest Portable 1 View  06/13/2014   CLINICAL DATA:  Left arm pain  EXAM: PORTABLE CHEST - 1 VIEW  COMPARISON:  05/27/2014, 01/02/2014  FINDINGS: Bilateral diffuse mild interstitial thickening. Prominence of the central pulmonary vasculature. No pleural effusion or  pneumothorax. Stable cardiomegaly. Unremarkable osseous structures.  IMPRESSION: Mild bilateral interstitial thickening likely chronic. Mild superimposed interstitial edema or infection cannot be excluded.   Electronically Signed   By: Kathreen Devoid   On: 06/13/2014 19:39   Dg Chest Port 1 View  05/27/2014   CLINICAL DATA:  Check PICC line placement  EXAM: PORTABLE CHEST - 1 VIEW  COMPARISON:  01/02/2014  FINDINGS: A left PICC line is noted. The catheter tip appears at the level of the cavoatrial junction. No pneumothorax is seen. The cardiac shadow remains enlarged. A curvilinear density is noted overlying the right chest is likely extrinsic to the patient. No focal infiltrate is  seen.  IMPRESSION: PICC line as described.  Curvilinear density over the right mid lung which was not seen on the prior exam and is likely extrinsic to the patient.   Electronically Signed   By: Inez Catalina M.D.   On: 05/27/2014 12:53    Lab Results: Basic Metabolic Panel: No results found for this basename: NA, K, CL, CO2, GLUCOSE, BUN, CREATININE, CALCIUM, MG, PHOS,  in the last 72 hours Liver Function Tests: No results found for this basename: AST, ALT, ALKPHOS, BILITOT, PROT, ALBUMIN,  in the last 72 hours   CBC: No results found for this basename: WBC, NEUTROABS, HGB, HCT, MCV, PLT,  in the last 72 hours  Recent Results (from the past 240 hour(s))  CULTURE, BLOOD (ROUTINE X 2)     Status: None   Collection Time    06/13/14  9:15 PM      Result Value Ref Range Status   Specimen Description BLOOD LEFT HAND   Final   Special Requests BOTTLES DRAWN AEROBIC ONLY 6CC   Final   Culture NO GROWTH 3 DAYS   Final   Report Status PENDING   Incomplete  CULTURE, BLOOD (ROUTINE X 2)     Status: None   Collection Time    06/13/14  9:15 PM      Result Value Ref Range Status   Specimen Description BLOOD LEFT HAND   Final   Special Requests BOTTLES DRAWN AEROBIC AND ANAEROBIC 6CC   Final   Culture NO GROWTH 3 DAYS   Final    Report Status PENDING   Incomplete  MRSA PCR SCREENING     Status: Abnormal   Collection Time    06/14/14  6:45 AM      Result Value Ref Range Status   MRSA by PCR POSITIVE (*) NEGATIVE Final   Comment:            The GeneXpert MRSA Assay (FDA     approved for NASAL specimens     only), is one component of a     comprehensive MRSA colonization     surveillance program. It is not     intended to diagnose MRSA     infection nor to guide or     monitor treatment for     MRSA infections.     RESULT CALLED TO, READ BACK BY AND VERIFIED WITH:     NADINE ROWE RN ON [id number] AT 13 BY RESSEGGER R  URINE CULTURE     Status: None   Collection Time    06/14/14  9:25 AM      Result Value Ref Range Status   Specimen Description URINE, CLEAN CATCH   Final   Special Requests NONE   Final   Culture  Setup Time     Final   Value: 06/14/2014 22:55     Performed at [facility]     Final   Value: >=100,000 COLONIES/ML     Performed at [facility]   Culture     Final   Value: [place]     Performed at [facility]   Report Status PENDING   Incomplete     Hospital Course: This is a 73 year old who is a resident at a skilled care facility and who was found to be less responsive than usual and more short of breath. Apparently her oxygen came off and because of her cerebral palsy she was unable to  place it back on. She was started on high flow oxygen but was noted to be short of breath and was sent to the emergency department for evaluation. In the emergency department she was noted to be in acute respiratory failure with a PCO2 of greater than 90. She was treated with BiPAP which she did not tolerate but she improved anyway. She became more alert and was back at her baseline mental status. She was initially admitted to step down and then was transferred to the floor. She was treated with antibiotics and steroids and improved. She's back to baseline at  the time of discharge.  Discharge Exam: Blood pressure 146/81, pulse 75, temperature 98.1 F (36.7 C), temperature source Oral, resp. rate 18, height 5\' 5"  (1.651 m), weight 101.8 kg (224 lb 6.9 oz), SpO2 98.00%. She is awake and alert. She is in no acute distress. Her chest is clear. Her heart is regular  Disposition: Back to the skilled care facility. She has positive urine culture but we don't have an organism or sensitivities. She will be placed on Cipro 250 mg by mouth twice a day for 7 days with anticipation that if the culture does not show sensitivity to Cipro this will need to be changed. She has some skin breakdown on her legs and Foley catheter will be left in place for now. She will try imodium prn for diarrhea and aspercreme prn for shoulder pain      Signed: HAWKINS,EDWARD L   06/17/2014, 8:53 AM

## 2014-06-17 NOTE — Progress Notes (Signed)
Report was called to [facility] to notify them that pt was returning to the facility. IV was d/c. Pt d/c via EMS to facility. Family is aware of transfer. Marry Guan

## 2014-06-17 NOTE — Clinical Social Work Note (Signed)
Pt d/c today back to [facility]. D/C summary faxed. Pt and pt's sister, Everline aware and agreeable. CSW also notified pt's son by voicemail at her request. Pt to transport via [facility].  Benay Pike, [place]

## 2014-06-17 NOTE — Progress Notes (Signed)
Subjective: She feels okay. She has no new complaints.  Objective: Vital signs in last 24 hours: Temp:  [98.1 F (36.7 C)-98.6 F (37 C)] 98.1 F (36.7 C) (06/29 0611) Pulse Rate:  [75-80] 75 (06/29 0611) Resp:  [18] 18 (06/29 0611) BP: (110-146)/(74-81) 146/81 mmHg (06/29 0611) SpO2:  [95 %-98 %] 98 % (06/29 0730) Weight change:  Last BM Date: 06/16/14  Intake/Output from previous day: 06/28 0701 - 06/29 0700 In: 363 [P.O.:360; I.V.:3] Out: 500 [Urine:500]  PHYSICAL EXAM General appearance: alert, cooperative and With chronic changes from cerebral palsy Resp: clear to auscultation bilaterally Cardio: regular rate and rhythm, S1, S2 normal, no murmur, click, rub or gallop GI: soft, non-tender; bowel sounds normal; no masses,  no organomegaly Extremities: extremities normal, atraumatic, no cyanosis or edema  Lab Results:  No results found for this or any previous visit (from the past 48 hour(s)).  ABGS  Recent Labs  06/15/14 0545  PHART 7.386  PO2ART 70.4*  TCO2 40.9  HCO3 44.6*   CULTURES Recent Results (from the past 240 hour(s))  CULTURE, BLOOD (ROUTINE X 2)     Status: None   Collection Time    06/13/14  9:15 PM      Result Value Ref Range Status   Specimen Description BLOOD LEFT HAND   Final   Special Requests BOTTLES DRAWN AEROBIC ONLY 6CC   Final   Culture NO GROWTH 3 DAYS   Final   Report Status PENDING   Incomplete  CULTURE, BLOOD (ROUTINE X 2)     Status: None   Collection Time    06/13/14  9:15 PM      Result Value Ref Range Status   Specimen Description BLOOD LEFT HAND   Final   Special Requests BOTTLES DRAWN AEROBIC AND ANAEROBIC 6CC   Final   Culture NO GROWTH 3 DAYS   Final   Report Status PENDING   Incomplete  MRSA PCR SCREENING     Status: Abnormal   Collection Time    06/14/14  6:45 AM      Result Value Ref Range Status   MRSA by PCR POSITIVE (*) NEGATIVE Final   Comment:            The GeneXpert MRSA Assay (FDA     approved for  NASAL specimens     only), is one component of a     comprehensive MRSA colonization     surveillance program. It is not     intended to diagnose MRSA     infection nor to guide or     monitor treatment for     MRSA infections.     RESULT CALLED TO, READ BACK BY AND VERIFIED WITH:     NADINE ROWE RN ON [id number] AT 25 BY RESSEGGER R  URINE CULTURE     Status: None   Collection Time    06/14/14  9:25 AM      Result Value Ref Range Status   Specimen Description URINE, CLEAN CATCH   Final   Special Requests NONE   Final   Culture  Setup Time     Final   Value: 06/14/2014 22:55     Performed at [place]     Final   Value: >=100,000 COLONIES/ML     Performed at [facility]   Culture     Final   Value: [place]     Performed at [facility]   Report Status PENDING   Incomplete   Studies/Results: No results found.  Medications:  Prior to Admission:  Prescriptions prior to admission  Medication Sig Dispense Refill  . acetaminophen (TYLENOL) 325 MG tablet Take 2 tablets (650 mg total) by mouth every 6 (six) hours as needed for mild pain (or Fever >/= 101).      Marland Kitchen albuterol (PROVENTIL HFA;VENTOLIN HFA) 108 (90 BASE) MCG/ACT inhaler Inhale 2 puffs into the lungs every 4 (four) hours as needed. Shortness of breath  1 Inhaler  12  . albuterol (PROVENTIL) (2.5 MG/3ML) 0.083% nebulizer solution Take 2.5 mg by nebulization 4 (four) times daily.      Marland Kitchen ALPRAZolam (XANAX) 1 MG tablet Take 1 mg by mouth 3 (three) times daily.       . Amino Acids-Protein Hydrolys (FEEDING SUPPLEMENT, PRO-STAT SUGAR FREE 64,) LIQD Take 30 mLs by mouth 2 (two) times daily with a meal.      . aspirin 325 MG tablet Take 1 tablet (325 mg total) by mouth daily.  30 tablet  12  . atorvastatin (LIPITOR) 80 MG tablet Take 1 tablet (80 mg total) by mouth daily at 6 PM.      . baclofen (LIORESAL) 10 MG tablet Take 5 mg by mouth 3 (three) times daily.      .  cholecalciferol (VITAMIN D) 1000 UNITS tablet Take 1,000 Units by mouth daily.      . clopidogrel (PLAVIX) 75 MG tablet Take 1 tablet (75 mg total) by mouth daily with breakfast.      . Cranberry 450 MG CAPS Take 1 capsule by mouth daily.      . furosemide (LASIX) 20 MG tablet Take 20 mg by mouth daily.      Marland Kitchen guaiFENesin (MUCINEX) 600 MG 12 hr tablet Take 600 mg by mouth 2 (two) times daily.      Marland Kitchen levothyroxine (SYNTHROID, LEVOTHROID) 88 MCG tablet Take 1 tablet (88 mcg total) by mouth daily before breakfast.  30 tablet  12  . loratadine (CLARITIN) 10 MG tablet Take 10 mg by mouth daily.      . nitroGLYCERIN (NITROSTAT) 0.4 MG SL tablet Place 1 tablet (0.4 mg total) under the tongue every 5 (five) minutes as needed for chest pain.  25 tablet  12  . pantoprazole (PROTONIX) 40 MG tablet Take 1 tablet (40 mg total) by mouth daily.  30 tablet  12  . polyethylene glycol (MIRALAX / GLYCOLAX) packet Take 17 g by mouth daily.      . potassium chloride SA (K-DUR,KLOR-CON) 20 MEQ tablet Take 10 mEq by mouth once.       . predniSONE (DELTASONE) 5 MG tablet Take 5 mg by mouth daily with breakfast.      . saccharomyces boulardii (FLORASTOR) 250 MG capsule Take 250 mg by mouth 2 (two) times daily.      . sertraline (ZOLOFT) 100 MG tablet Take 1 tablet (100 mg total) by mouth daily.      . temazepam (RESTORIL) 7.5 MG capsule Take 7.5 mg by mouth at bedtime as needed for sleep.      . traMADol (ULTRAM) 50 MG tablet Take 50 mg by mouth every 6 (six) hours as needed for moderate pain.       Scheduled: . albuterol  2.5 mg Nebulization TID  . antiseptic oral rinse  15 mL Mouth Rinse q12n4p  . aspirin  325 mg Oral Daily  . atorvastatin  80 mg Oral q1800  .  baclofen  5 mg Oral TID  . chlorhexidine  15 mL Mouth Rinse BID  . Chlorhexidine Gluconate Cloth  6 each Topical Q0600  . cholecalciferol  1,000 Units Oral Daily  . clopidogrel  75 mg Oral Q breakfast  . enoxaparin (LOVENOX) injection  40 mg Subcutaneous  Q24H  . feeding supplement (PRO-STAT SUGAR FREE 64)  30 mL Oral BID WC  . furosemide  40 mg Oral Daily  . guaiFENesin  600 mg Oral BID  . levothyroxine  88 mcg Oral QAC breakfast  . loratadine  10 mg Oral Daily  . losartan  25 mg Oral Daily  . mupirocin ointment  1 application Nasal BID  . pantoprazole  40 mg Oral Daily  . polyethylene glycol  17 g Oral Daily  . predniSONE  40 mg Oral Q breakfast  . saccharomyces boulardii  250 mg Oral BID  . sertraline  100 mg Oral Daily  . sodium chloride  3 mL Intravenous Q12H   Continuous:  [facility]:EYEMVV chloride, acetaminophen, ALPRAZolam, nitroGLYCERIN, sodium chloride, sodium chloride, temazepam, traMADol  Assesment: She was admitted with acute on chronic respiratory failure. She is much improved. She's ready for discharge Principal Problem:   Acute and chronic respiratory failure with hypercapnia Active Problems:   Morbid obesity   Cerebral palsy   Asthma   Hypertension   Chronic steroid use   Chronic respiratory failure with hypoxia   Anxiety state, unspecified   Encephalopathy, metabolic   Respiratory failure    Plan: Discharge back to her nursing home    LOS: 4 days   HAWKINS,EDWARD L 06/17/2014, 8:24 AM

## 2014-06-18 ENCOUNTER — Encounter (HOSPITAL_COMMUNITY): Payer: Self-pay

## 2014-06-18 LAB — URINE CULTURE

## 2014-06-18 LAB — CULTURE, BLOOD (ROUTINE X 2)
CULTURE: NO GROWTH
Culture: NO GROWTH

## 2014-06-27 NOTE — Progress Notes (Signed)
Tracy Bogus, MD [address] [place] [address]  Infiltrating ductal carcinoma of breast, right  Osteoporosis - Plan: denosumab (PROLIA) injection 60 mg, SCHEDULING COMMUNICATION  CURRENT THERAPY:Exemestane 25 mg daily, but she reports that she stopped the medication (I do not have an updated MAR available).  INTERVAL HISTORY: [name] 73 y.o. female returns for  regular  visit for followup of stage I right breast cancer thought to be taking exemestane 25 mg daily, but no updated MAR provided on today's visit, status post lumpectomy but never having received radiotherapy because of difficulty with cerebral palsy and uncontrolled movements.   [name] was admitted to the [facility] on 06/13/2014 and discharged on 06/17/2014 for Acute and chronic respiratory failure with hypercapnia.  I personally reviewed and went over laboratory results with the patient.  The results are noted within this dictation.  Labs from 6/26 are adequate for Prolia injection today.  She did not receive her medication before she left the nursing home today for today's appointment and therefore she notes that she is more "jittery."  She reports that she stopped her Exemestane, but I do not have an updated MAR from the nursing home to support this.  If she did stop the medication, that is ok since she has completed 5 years worth of therapy.   Oncologically, she denies any complaints and ROS questioning is negative.   Past Medical History  Diagnosis Date  . Cerebral palsy   . Asthma   . Seasonal allergies   . Thyroid disease   . HTN (hypertension)   . Anxiety   . Arthritis   . Breast cancer     Right breast, infiltrating ductal.  . Anginal pain   . Osteoporosis 05/08/2012  . Osteoporosis 05/08/2012  . Stroke   . Angina at rest   . Chronic steroid use   . Pneumonia 09/2013  . Dysphagia   . Hypopotassemia   . Respiratory failure   . Generalized weakness   . Urinary  retention   . Dysphagia   . Acute MI   . Hypopotassemia   . Sepsis   . Neurogenic bladder   . Reflux   . Obesity     has CARPAL TUNNEL SYNDROME, BILATERAL; COMPLETE RUPTURE OF ROTATOR CUFF; CLOSED FRACTURE OF DISTAL END OF ULNA; DEGENERATIVE JOINT DISEASE, RIGHT KNEE; BURSITIS, KNEE; Hemarthrosis involving knee joint; Sprain of left knee; Arthritis of knee, right; Infiltrating ductal carcinoma of right breast; Sciatica; DDD (degenerative disc disease), lumbar; Osteoporosis; Wheezes; Morbid obesity; Cerebral palsy; Thrombocytopenia; Asthma; Hypertension; Chronic steroid use; Leg edema, left; Chronic respiratory failure with hypoxia; Hypothyroidism; Asthma with acute exacerbation; Pneumonia; Anxiety state, unspecified; NSTEMI (non-ST elevated myocardial infarction); Sepsis; Influenza A; HCAP (healthcare-associated pneumonia); Acute and chronic respiratory failure with hypercapnia; Encephalopathy, metabolic; Respiratory failure; Intertriginous skin ulcer, limited to breakdown of skin; and UTI (urinary tract infection) on her problem list.     is allergic to naproxen.  [name] does not currently have medications on file.  Past Surgical History  Procedure Laterality Date  . Breast lumpectomy    . Radical abdominal hysterectomy    . Dental extraction    . Right hand surgery      Denies any headaches, dizziness, double vision, fevers, chills, night sweats, nausea, vomiting, diarrhea, constipation, chest pain, heart palpitations, shortness of breath, blood in stool, black tarry stool, urinary pain, urinary burning, urinary frequency, hematuria.   PHYSICAL EXAMINATION  ECOG PERFORMANCE STATUS: 4 -  Bedbound  Filed Vitals:   06/28/14 1300  BP: 100/74  Pulse: 102  Temp: 98.1 F (36.7 C)  Resp: 18    GENERAL:alert, no distress, well nourished, well developed, comfortable, cooperative, obese, smiling and cerebral palsy SKIN: skin color, texture, turgor are normal, no rashes or significant  lesions HEAD: Normocephalic, No masses, lesions, tenderness or abnormalities EYES: normal, PERRLA, EOMI, Conjunctiva are pink and non-injected EARS: External ears normal OROPHARYNX:mucous membranes are moist  NECK: supple, trachea midline LYMPH:  not examined BREAST:not examined LUNGS: clear to auscultation  HEART: regular rate & rhythm and no murmurs ABDOMEN:abdomen soft, non-tender and normal bowel sounds BACK: Back symmetric, no curvature. EXTREMITIES:less then 2 second capillary refill, no joint deformities, effusion, or inflammation, no skin discoloration, no cyanosis  NEURO: alert & oriented x 3 with fluent speech, in a wheelchair   LABORATORY DATA: CBC    Component Value Date/Time   WBC 6.3 06/14/2014 0452   RBC 3.93 06/14/2014 0452   HGB 11.2* 06/14/2014 0452   HCT 38.4 06/14/2014 0452   PLT 153 06/14/2014 0452   MCV 97.7 06/14/2014 0452   MCH 28.5 06/14/2014 0452   MCHC 29.2* 06/14/2014 0452   RDW 15.1 06/14/2014 0452   LYMPHSABS 0.8 06/13/2014 1840   MONOABS 0.5 06/13/2014 1840   EOSABS 0.4 06/13/2014 1840   BASOSABS 0.0 06/13/2014 1840      Chemistry      Component Value Date/Time   NA 145 06/14/2014 0452   K 4.2 06/14/2014 0452   CL 96 06/14/2014 0452   CO2 44* 06/14/2014 0452   BUN 16 06/14/2014 0452   CREATININE 0.34* 06/14/2014 0452      Component Value Date/Time   CALCIUM 9.1 06/14/2014 0452   ALKPHOS 83 06/13/2014 2007   AST 14 06/13/2014 2007   ALT 10 06/13/2014 2007   BILITOT 0.4 06/13/2014 2007     Lab Results  Component Value Date   LABCA2 27 11/23/2013       ASSESSMENT:  1. Stage I right breast cancer thought to be taking exemestane 25 mg daily, but no updated MAR provided on today's visit, status post lumpectomy but never having received radiotherapy because of difficulty with cerebral palsy and uncontrolled movements.  NED.  No further mammograms warranted. 2. Cerebral palsy with choreoform movements  Patient Active Problem List   Diagnosis Date Noted    . Intertriginous skin ulcer, limited to breakdown of skin 06/17/2014  . UTI (urinary tract infection) 06/17/2014  . Acute and chronic respiratory failure with hypercapnia 06/14/2014  . Encephalopathy, metabolic 87/68/1157  . Respiratory failure 06/14/2014  . NSTEMI (non-ST elevated myocardial infarction) 12/31/2013  . Sepsis 12/31/2013  . Influenza A 12/31/2013  . HCAP (healthcare-associated pneumonia) 12/31/2013  . Asthma with acute exacerbation 08/09/2013  . Pneumonia 08/09/2013  . Anxiety state, unspecified 08/09/2013  . Wheezes 06/11/2013  . Morbid obesity 06/11/2013  . Cerebral palsy 06/11/2013  . Thrombocytopenia 06/11/2013  . Asthma 06/11/2013  . Hypertension 06/11/2013  . Chronic steroid use 06/11/2013  . Leg edema, left 06/11/2013  . Chronic respiratory failure with hypoxia 06/11/2013  . Hypothyroidism 06/11/2013  . Osteoporosis 05/08/2012  . Sciatica 02/10/2012  . DDD (degenerative disc disease), lumbar 02/10/2012  . Infiltrating ductal carcinoma of right breast   . Sprain of left knee 08/19/2011  . Arthritis of knee, right 08/19/2011  . Hemarthrosis involving knee joint 07/07/2011  . DEGENERATIVE JOINT DISEASE, RIGHT KNEE 12/02/2010  . BURSITIS, KNEE 12/02/2010  . CARPAL TUNNEL SYNDROME,  BILATERAL 05/21/2010  . COMPLETE RUPTURE OF ROTATOR CUFF 05/21/2010  . CLOSED FRACTURE OF DISTAL END OF ULNA 04/08/2010     PLAN:  1. I personally reviewed and went over laboratory results with the patient.  The results are noted within this dictation. 2. I personally reviewed and went over radiographic studies with the patient.  The results are noted within this dictation.  CT of head is negative for any signs of metastatic disease. 3. Requested an update MAR from nursing home 4. Prolia supportive therapy plan reviewed. 5. Prolia 60 mg SQ today and then in 6 months. 6. Labs in 6 months: CBC diff, CMET 7. Return in 6 months for follow-up   THERAPY PLAN:  From an  osteoporosis standpoint, we will continue to treat with Prolia 60 mg every 6 months.  It will be given today.  From an oncology standpoint, she can either continue for discontinue Exemestane daily as she has completed the recommended 5 years worth of therapy and we will follow NCCN guidelines pertaining to surveillance minus mammograms.   NCCN guidelines recommends the following surveillance for invasive breast cancer:  A. History and Physical exam every 4-6 months for 5 years and then every 12 months.  B. Mammography every 12 months  C. Women on Tamoxifen: annual gynecologic assessment every 12 months if uterus is present.  D. Women on aromatase inhibitor or who experience ovarian failure secondary to treatment should have monitoring of bone health with a bone mineral density determination at baseline and periodically thereafter.  E. Assess and encourage adherence to adjuvant endocrine therapy.  F. Evidence suggests that active lifestyle and achieving and maintaining an ideal body weight (20-25 BMI) may lead to optimal breast cancer outcomes.  All questions were answered. The patient knows to call the clinic with any problems, questions or concerns. We can certainly see the patient much sooner if necessary.  Patient and plan discussed with Dr. Farrel Gobble and he is in agreement with the aforementioned.   KEFALAS,THOMAS 06/28/2014

## 2014-06-28 ENCOUNTER — Encounter (HOSPITAL_COMMUNITY): Payer: Medicare Other | Attending: Oncology | Admitting: Oncology

## 2014-06-28 ENCOUNTER — Encounter (HOSPITAL_COMMUNITY): Payer: Self-pay | Admitting: Oncology

## 2014-06-28 VITALS — BP 100/74 | HR 102 | Temp 98.1°F | Resp 18

## 2014-06-28 DIAGNOSIS — G809 Cerebral palsy, unspecified: Secondary | ICD-10-CM

## 2014-06-28 DIAGNOSIS — C50911 Malignant neoplasm of unspecified site of right female breast: Secondary | ICD-10-CM

## 2014-06-28 DIAGNOSIS — Z853 Personal history of malignant neoplasm of breast: Secondary | ICD-10-CM

## 2014-06-28 DIAGNOSIS — M810 Age-related osteoporosis without current pathological fracture: Secondary | ICD-10-CM

## 2014-06-28 MED ORDER — DENOSUMAB 60 MG/ML ~~LOC~~ SOLN
60.0000 mg | Freq: Once | SUBCUTANEOUS | Status: AC
  Administered 2014-06-28: 60 mg via SUBCUTANEOUS
  Filled 2014-06-28: qty 1

## 2014-06-28 NOTE — Progress Notes (Signed)
Oluwatoyin G Huy's reason for visit today is for an injection and labs as scheduled per MD orders.  Labs were drawn prior to administration of ordered medication.  Venipuncture performed with a 23 gauge butterfly needle to R Antecubital.  Analyce G Orman also received prolia 60mg  sq per MD orders; see MAR for administration details.  Nawal G Alford tolerated all procedures well and without incident; questions were answered and patient was discharged.

## 2014-06-28 NOTE — Patient Instructions (Signed)
[facility] Discharge Instructions  RECOMMENDATIONS MADE BY THE CONSULTANT AND ANY TEST RESULTS WILL BE SENT TO YOUR REFERRING PHYSICIAN.   We will see you in 6 months for your prolia, a doctor's appointment and repeat labs.    Thank you for choosing [facility] to provide your oncology and hematology care.  To afford each patient quality time with our providers, please arrive at least 15 minutes before your scheduled appointment time.  With your help, our goal is to use those 15 minutes to complete the necessary work-up to ensure our physicians have the information they need to help with your evaluation and healthcare recommendations.    Effective January 1st, 2014, we ask that you re-schedule your appointment with our physicians should you arrive 10 or more minutes late for your appointment.  We strive to give you quality time with our providers, and arriving late affects you and other patients whose appointments are after yours.    Again, thank you for choosing [facility].  Our hope is that these requests will decrease the amount of time that you wait before being seen by our physicians.       _____________________________________________________________  Should you have questions after your visit to [facility], please contact our office at [phone] between the hours of 8:30 a.m. and 4:30 p.m.  Voicemails left after 4:30 p.m. will not be returned until the following business day.  For prescription refill requests, have your pharmacy contact our office with your prescription refill request.    _______________________________________________________________  We hope that we have given you very good care.  You may receive a patient satisfaction survey in the mail, please complete it and return it as soon as possible.  We value your feedback!  _______________________________________________________________  Have you asked  about our STAR program?  STAR stands for Survivorship Training and Rehabilitation, and this is a nationally recognized cancer care program that focuses on survivorship and rehabilitation.  Cancer and cancer treatments may cause problems, such as, pain, making you feel tired and keeping you from doing the things that you need or want to do. Cancer rehabilitation can help. Our goal is to reduce these troubling effects and help you have the best quality of life possible.  You may receive a survey from a nurse that asks questions about your current state of health.  Based on the survey results, all eligible patients will be referred to the [facility] program for an evaluation so we can better serve you!  A frequently asked questions sheet is available upon request.

## 2014-07-30 ENCOUNTER — Other Ambulatory Visit (HOSPITAL_COMMUNITY): Payer: Medicare Other

## 2014-08-06 LAB — BLOOD GAS, ARTERIAL
ACID-BASE EXCESS: 11.4 mmol/L — AB (ref 0.0–2.0)
Bicarbonate: 38.1 mEq/L — ABNORMAL HIGH (ref 20.0–24.0)
Drawn by: 22223
O2 CONTENT: 7 L/min
O2 Saturation: 98.6 %
PCO2 ART: 81.6 mmHg — AB (ref 35.0–45.0)
PH ART: 7.291 — AB (ref 7.350–7.450)
TCO2: 35.2 mmol/L (ref 0–100)
pO2, Arterial: 145 mmHg — ABNORMAL HIGH (ref 80.0–100.0)

## 2014-08-30 ENCOUNTER — Ambulatory Visit (HOSPITAL_COMMUNITY)
Admission: RE | Admit: 2014-08-30 | Discharge: 2014-08-30 | Disposition: A | Discharge status: Home / Self Care | Payer: Medicare Other | Source: Ambulatory Visit | Attending: Internal Medicine | Admitting: Internal Medicine

## 2014-08-30 DIAGNOSIS — N390 Urinary tract infection, site not specified: Secondary | ICD-10-CM | POA: Insufficient documentation

## 2014-08-30 NOTE — Discharge Instructions (Signed)
PICC Insertion, Care After Refer to this sheet in the next few weeks. These instructions provide you with information on caring for yourself after your procedure. Your health care provider may also give you more specific instructions. Your treatment has been planned according to current medical practices, but problems sometimes occur. Call your health care provider if you have any problems or questions after your procedure. WHAT TO EXPECT AFTER THE PROCEDURE After your procedure, it is typical to have the following:  Mild discomfort at the insertion site. This should not last more than a day. HOME CARE INSTRUCTIONS  Rest at home for the remainder of the day after the procedure.  You may bend your arm and move it freely. If your PICC is near or at the bend of your elbow, avoid activity with repeated motion at the elbow.  Avoid lifting heavy objects as instructed by your health care provider.  Avoid using a crutch with the arm on the same side as your PICC. You may need to use a walker. Bandage Care  Keep your PICC bandage (dressing) clean and dry to prevent infection.  Ask your health care provider when you may shower. To keep the dressing dry, cover the PICC with plastic wrap and tape before showering. If the dressing does become wet, replace it right after the shower.  Do not soak in the bath, swim, or use hot tubs when you have a PICC.  Change the PICC dressing as instructed by your health care provider.  Change your PICC dressing if it becomes loose or wet. General PICC Care  Check the PICC insertion site daily for leakage, redness, swelling, or pain.  Flush the PICC as directed by your health care provider. Let your health care provider know right away if the PICC is difficult to flush or does not flush. Do not use force to flush the PICC.  Do not use a syringe that is less than 10 mL to flush the PICC.  Never pull or tug on the PICC.  Avoid blood pressure checks on the arm  with the PICC.  Keep your PICC identification card with you at all times.  Do not take the PICC out yourself. Only a trained health care professional should remove the PICC. SEEK MEDICAL CARE IF:  You have pain in your arm, ear, face, or teeth.  You have fever or chills.  You have drainage from the PICC insertion site.  You have redness or palpate a "cord" around the PICC insertion site.  You cannot flush the catheter. SEEK IMMEDIATE MEDICAL CARE IF:  You have swelling in the arm in which the PICC is inserted. Document Released: 09/26/2013 Document Revised: 12/11/2013 Document Reviewed: 09/26/2013 [facility] Patient Information 2015 [place], [place]. This information is not intended to replace advice given to you by your health care provider. Make sure you discuss any questions you have with your health care provider. PICC Home Guide A peripherally inserted central catheter (PICC) is a long, thin, flexible tube that is inserted into a vein in the upper arm. It is a form of intravenous (IV) access. It is considered to be a "central" line because the tip of the PICC ends in a large vein in your chest. This large vein is called the superior vena cava (SVC). The PICC tip ends in the SVC because there is a lot of blood flow in the SVC. This allows medicines and IV fluids to be quickly distributed throughout the body. The PICC is inserted using a  sterile technique by a specially trained nurse or physician. After the PICC is inserted, a chest X-ray exam is done to be sure it is in the correct place.  °A PICC may be placed for different reasons, such as: °· To give medicines and liquid nutrition that can only be given through a central line. Examples are: °· Certain antibiotic treatments. °· Chemotherapy. °· Total parenteral nutrition (TPN). °· To take frequent blood samples. °· To give IV fluids and blood products. °· If there is difficulty placing a peripheral intravenous (PIV) catheter. °If taken care  of properly, a PICC can remain in place for several months. A PICC can also allow a person to go home from the hospital early. Medicine and PICC care can be managed at home by a family member or home health care team. °WHAT PROBLEMS CAN HAPPEN WHEN I HAVE A PICC? °Problems with a PICC can occasionally occur. These may include the following: °· A blood clot (thrombus) forming in or at the tip of the PICC. This can cause the PICC to become clogged. A clot-dissolving medicine called tissue plasminogen activator (tPA) can be given through the PICC to help break up the clot. °· Inflammation of the vein (phlebitis) in which the PICC is placed. Signs of inflammation may include redness, pain at the insertion site, red streaks, or being able to feel a "cord" in the vein where the PICC is located. °· Infection in the PICC or at the insertion site. Signs of infection may include fever, chills, redness, swelling, or pus drainage from the PICC insertion site. °· PICC movement (malposition). The PICC tip may move from its original position due to excessive physical activity, forceful coughing, sneezing, or vomiting. °· A break or cut in the PICC. It is important to not use scissors near the PICC. °· Nerve or tendon irritation or injury during PICC insertion. °WHAT SHOULD I KEEP IN MIND ABOUT ACTIVITIES WHEN I HAVE A PICC? °· You may bend your arm and move it freely. If your PICC is near or at the bend of your elbow, avoid activity with repeated motion at the elbow. °· Rest at home for the remainder of the day following PICC line insertion. °· Avoid lifting heavy objects as instructed by your health care provider. °· Avoid using a crutch with the arm on the same side as your PICC. You may need to use a walker. °WHAT SHOULD I KNOW ABOUT MY PICC DRESSING? °· Keep your PICC bandage (dressing) clean and dry to prevent infection. °· Ask your health care provider when you may shower. Ask your health care provider to teach you how to  wrap the PICC when you do take a shower. °· Change the PICC dressing as instructed by your health care provider. °· Change your PICC dressing if it becomes loose or wet. °WHAT SHOULD I KNOW ABOUT PICC CARE? °· Check the PICC insertion site daily for leakage, redness, swelling, or pain. °· Do not take a bath, swim, or use hot tubs when you have a PICC. Cover PICC line with clear plastic wrap and tape to keep it dry while showering. °· Flush the PICC as directed by your health care provider. Let your health care provider know right away if the PICC is difficult to flush or does not flush. Do not use force to flush the PICC. °· Do not use a syringe that is less than 10 mL to flush the PICC. °· Never pull or tug on the PICC. °·   Avoid blood pressure checks on the arm with the PICC. °· Keep your PICC identification card with you at all times. °· Do not take the PICC out yourself. Only a trained clinical professional should remove the PICC. °SEEK IMMEDIATE MEDICAL CARE IF: °· Your PICC is accidentally pulled all the way out. If this happens, cover the insertion site with a bandage or gauze dressing. Do not throw the PICC away. Your health care provider will need to inspect it. °· Your PICC was tugged or pulled and has partially come out. Do not  push the PICC back in. °· There is any type of drainage, redness, or swelling where the PICC enters the skin. °· You cannot flush the PICC, it is difficult to flush, or the PICC leaks around the insertion site when it is flushed. °· You hear a "flushing" sound when the PICC is flushed. °· You have pain, discomfort, or numbness in your arm, shoulder, or jaw on the same side as the PICC. °· You feel your heart "racing" or skipping beats. °· You notice a hole or tear in the PICC. °· You develop chills or a fever. °MAKE SURE YOU:  °· Understand these instructions. °· Will watch your condition. °· Will get help right away if you are not doing well or get worse. °Document Released:  06/12/2003 Document Revised: 04/22/2014 Document Reviewed: 08/13/2013 °[employer]® Patient Information ©2015 [employer]. This information is not intended to replace advice given to you by your health care provider. Make sure you discuss any questions you have with your health care provider. ° °

## 2014-08-30 NOTE — Progress Notes (Addendum)
Peripherally Inserted Central Catheter/Midline Placement  The IV Nurse has discussed with the patient and/or persons authorized to consent for the patient, the purpose of this procedure and the potential benefits and risks involved with this procedure.  The benefits include less needle sticks, lab draws from the catheter and patient may be discharged home with the catheter.  Risks include, but not limited to, infection, bleeding, blood clot (thrombus formation), and puncture of an artery; nerve damage and irregular heat beat.  Alternatives to this procedure were also discussed.  PICC/Midline Placement Documentation  PICC / Midline Single Lumen 08/30/14 PICC Left Brachial 43 cm 0 cm (Active)  Indication for Insertion or Continuance of Line Limited venous access - need for IV therapy >5 days (PICC only) 08/30/2014  4:33 PM  Exposed Catheter (cm) 0 cm 08/30/2014  4:33 PM  Site Assessment Clean;Dry;Intact 08/30/2014  4:33 PM  Line Status Flushed;Capped (central line);Blood return noted 08/30/2014  4:33 PM  Dressing Type Transparent;Securing device 08/30/2014  4:33 PM  Dressing Status Clean;Dry;Intact;Antimicrobial disc in place 08/30/2014  4:33 PM  Line Care Connections checked and tightened 08/30/2014  4:33 PM  Line Adjustment (NICU/IV Team Only) No 08/30/2014  4:33 PM  Dressing Intervention New dressing 08/30/2014  4:33 PM  Dressing Change Due 09/06/14 08/30/2014  4:33 PM     PICC / Midline Double Lumen 34/35/68 PICC Left Basilic 44 cm 1 cm (Active)       [name] 08/30/2014, 4:35 PM PICC Line Insertion Procedure Note  Procedure: Insertion of #5 fr   Indications:  Long Term IV therapy for antibiotics to treat a UTI per DR. KIM's order.  Procedure Details  Informed consent was obtained for the procedure, including sedation.  Risks of lung perforation, hemorrhage, and adverse drug reaction were discussed.   Maximum sterile technique was used including antiseptics, cap, gloves, gown,  hand hygiene, mask and sheet.  5FR inserted to the L Brachial  vein per hospital protocol.   Blood return:  yes  Findings: Catheter inserted to 43 cm, with 0cm. Exposed.   There were no changes to vital signs. Catheter was flushed with 20 cc NS. Patient did tolerate procedure well.  Recommendations: 3CG technology used  to verify placement. PICC Brochure given to patient with teaching instruction.

## 2014-09-11 ENCOUNTER — Emergency Department (HOSPITAL_COMMUNITY)
Admission: EM | Admit: 2014-09-11 | Discharge: 2014-09-12 | Disposition: A | Discharge status: Home / Self Care | Payer: Medicare Other | Attending: Emergency Medicine | Admitting: Emergency Medicine

## 2014-09-11 ENCOUNTER — Emergency Department (HOSPITAL_COMMUNITY): Payer: Medicare Other

## 2014-09-11 ENCOUNTER — Encounter (HOSPITAL_COMMUNITY): Payer: Self-pay | Admitting: Emergency Medicine

## 2014-09-11 DIAGNOSIS — Y929 Unspecified place or not applicable: Secondary | ICD-10-CM | POA: Insufficient documentation

## 2014-09-11 DIAGNOSIS — K219 Gastro-esophageal reflux disease without esophagitis: Secondary | ICD-10-CM | POA: Insufficient documentation

## 2014-09-11 DIAGNOSIS — Z7982 Long term (current) use of aspirin: Secondary | ICD-10-CM | POA: Insufficient documentation

## 2014-09-11 DIAGNOSIS — R4182 Altered mental status, unspecified: Secondary | ICD-10-CM | POA: Diagnosis not present

## 2014-09-11 DIAGNOSIS — M129 Arthropathy, unspecified: Secondary | ICD-10-CM | POA: Diagnosis not present

## 2014-09-11 DIAGNOSIS — E079 Disorder of thyroid, unspecified: Secondary | ICD-10-CM | POA: Diagnosis not present

## 2014-09-11 DIAGNOSIS — IMO0002 Reserved for concepts with insufficient information to code with codable children: Secondary | ICD-10-CM | POA: Insufficient documentation

## 2014-09-11 DIAGNOSIS — Y9389 Activity, other specified: Secondary | ICD-10-CM | POA: Diagnosis not present

## 2014-09-11 DIAGNOSIS — Z79899 Other long term (current) drug therapy: Secondary | ICD-10-CM | POA: Insufficient documentation

## 2014-09-11 DIAGNOSIS — Z853 Personal history of malignant neoplasm of breast: Secondary | ICD-10-CM | POA: Insufficient documentation

## 2014-09-11 DIAGNOSIS — Z8701 Personal history of pneumonia (recurrent): Secondary | ICD-10-CM | POA: Diagnosis not present

## 2014-09-11 DIAGNOSIS — W050XXA Fall from non-moving wheelchair, initial encounter: Secondary | ICD-10-CM | POA: Diagnosis not present

## 2014-09-11 DIAGNOSIS — Z8673 Personal history of transient ischemic attack (TIA), and cerebral infarction without residual deficits: Secondary | ICD-10-CM | POA: Diagnosis not present

## 2014-09-11 DIAGNOSIS — W19XXXA Unspecified fall, initial encounter: Secondary | ICD-10-CM

## 2014-09-11 DIAGNOSIS — N3000 Acute cystitis without hematuria: Secondary | ICD-10-CM

## 2014-09-11 DIAGNOSIS — I252 Old myocardial infarction: Secondary | ICD-10-CM | POA: Diagnosis not present

## 2014-09-11 DIAGNOSIS — F411 Generalized anxiety disorder: Secondary | ICD-10-CM | POA: Insufficient documentation

## 2014-09-11 DIAGNOSIS — Z8619 Personal history of other infectious and parasitic diseases: Secondary | ICD-10-CM | POA: Insufficient documentation

## 2014-09-11 DIAGNOSIS — S161XXA Strain of muscle, fascia and tendon at neck level, initial encounter: Secondary | ICD-10-CM

## 2014-09-11 DIAGNOSIS — S139XXA Sprain of joints and ligaments of unspecified parts of neck, initial encounter: Secondary | ICD-10-CM | POA: Insufficient documentation

## 2014-09-11 DIAGNOSIS — S0990XA Unspecified injury of head, initial encounter: Secondary | ICD-10-CM | POA: Diagnosis not present

## 2014-09-11 DIAGNOSIS — Z7902 Long term (current) use of antithrombotics/antiplatelets: Secondary | ICD-10-CM | POA: Insufficient documentation

## 2014-09-11 DIAGNOSIS — E669 Obesity, unspecified: Secondary | ICD-10-CM | POA: Diagnosis not present

## 2014-09-11 DIAGNOSIS — J45909 Unspecified asthma, uncomplicated: Secondary | ICD-10-CM | POA: Insufficient documentation

## 2014-09-11 LAB — CBC WITH DIFFERENTIAL/PLATELET
BASOS ABS: 0 10*3/uL (ref 0.0–0.1)
BASOS PCT: 0 % (ref 0–1)
EOS PCT: 1 % (ref 0–5)
Eosinophils Absolute: 0.1 10*3/uL (ref 0.0–0.7)
HEMATOCRIT: 39.7 % (ref 36.0–46.0)
Hemoglobin: 10.9 g/dL — ABNORMAL LOW (ref 12.0–15.0)
Lymphocytes Relative: 11 % — ABNORMAL LOW (ref 12–46)
Lymphs Abs: 0.7 10*3/uL (ref 0.7–4.0)
MCH: 26.5 pg (ref 26.0–34.0)
MCHC: 27.5 g/dL — AB (ref 30.0–36.0)
MCV: 96.4 fL (ref 78.0–100.0)
MONO ABS: 0.5 10*3/uL (ref 0.1–1.0)
Monocytes Relative: 8 % (ref 3–12)
Neutro Abs: 4.6 10*3/uL (ref 1.7–7.7)
Neutrophils Relative %: 80 % — ABNORMAL HIGH (ref 43–77)
PLATELETS: 143 10*3/uL — AB (ref 150–400)
RBC: 4.12 MIL/uL (ref 3.87–5.11)
RDW: 16.3 % — AB (ref 11.5–15.5)
WBC: 5.8 10*3/uL (ref 4.0–10.5)

## 2014-09-11 LAB — URINE MICROSCOPIC-ADD ON

## 2014-09-11 LAB — URINALYSIS, ROUTINE W REFLEX MICROSCOPIC
Bilirubin Urine: NEGATIVE
Glucose, UA: NEGATIVE mg/dL
Ketones, ur: NEGATIVE mg/dL
Nitrite: POSITIVE — AB
PROTEIN: 100 mg/dL — AB
Specific Gravity, Urine: 1.025 (ref 1.005–1.030)
Urobilinogen, UA: 0.2 mg/dL (ref 0.0–1.0)
pH: 6 (ref 5.0–8.0)

## 2014-09-11 MED ORDER — LEVOFLOXACIN IN D5W 750 MG/150ML IV SOLN
750.0000 mg | Freq: Once | INTRAVENOUS | Status: AC
  Administered 2014-09-11: 750 mg via INTRAVENOUS
  Filled 2014-09-11: qty 150

## 2014-09-11 MED ORDER — HEPARIN SOD (PORK) LOCK FLUSH 100 UNIT/ML IV SOLN
INTRAVENOUS | Status: AC
  Filled 2014-09-11: qty 5

## 2014-09-11 MED ORDER — LEVOFLOXACIN IN D5W 750 MG/150ML IV SOLN: 750.0000 mg | INTRAVENOUS | Status: AC

## 2014-09-11 NOTE — ED Notes (Signed)
PICC line accessed for blood work per pt and family request, line able to be flushed easily, blood return noted. Blood work obtained, sent to lab,

## 2014-09-11 NOTE — ED Notes (Addendum)
Report given to Denea at [facility], staff also advised that prescription to continue levaquin could be sent to them to continue iv levaquin

## 2014-09-11 NOTE — ED Notes (Signed)
Done total care on patient and cleaned her up. She is ready to  Be returned to [facility].

## 2014-09-11 NOTE — ED Provider Notes (Signed)
CSN: [id number]     Arrival date & time 09/11/14  1842 History  This chart was scribed for Veryl Speak, MD by Tula Nakayama, ED Scribe. This patient was seen in room APA11/APA11 and the patient's care was started at 6:53 PM.       Chief Complaint  Patient presents with  . Fall   The history is provided by the patient, the nursing home and a friend. No language interpreter was used.   HPI Comments: [name] is a 73 y.o. female, with a history of cerebral palsy and a current UTI, who presents to the Emergency Department complaining of bilateral lower extremity pain after a fall from her high-backed wheelchair PTA. A representative of the nursing home facility states that she fell from the chair onto her knees. There were no witnesses to the fall and she was found laying prone on the floor. She does not think she hit her head during the fall. She stated that the pt had cyanosis and an SpO2 of about 70% at the time, but that her coloration improved with oxygen administration by EMS. Pt is currently on 3L of O2. Pt has not been ambulatory for 6-8 months and is currently not taking any anti-coagulants. The pt denies neck pain as an associated symptom. Pt's friend states that the pt seemed confused when she was talking to her earlier that day prior to the fall.  Past Medical History  Diagnosis Date  . Cerebral palsy   . Asthma   . Seasonal allergies   . Thyroid disease   . HTN (hypertension)   . Anxiety   . Arthritis   . Breast cancer     Right breast, infiltrating ductal.  . Anginal pain   . Osteoporosis 05/08/2012  . Osteoporosis 05/08/2012  . Stroke   . Angina at rest   . Chronic steroid use   . Pneumonia 09/2013  . Dysphagia   . Hypopotassemia   . Respiratory failure   . Generalized weakness   . Urinary retention   . Dysphagia   . Acute MI   . Hypopotassemia   . Sepsis   . Neurogenic bladder   . Reflux   . Obesity    Past Surgical History  Procedure Laterality Date  .  Breast lumpectomy    . Radical abdominal hysterectomy    . Dental extraction    . Right hand surgery     Family History  Problem Relation Age of Onset  . Cancer    . Diabetes    . Arthritis    . Asthma    . Heart attack Father   . Heart attack Mother    History  Substance Use Topics  . Smoking status: Never Smoker   . Smokeless tobacco: Never Used  . Alcohol Use: No   OB History   Grav Para Term Preterm Abortions TAB SAB Ect Mult Living                 Review of Systems  10 Systems reviewed and all are negative for acute change except as noted in the HPI.  Allergies  Naproxen  Home Medications   Prior to Admission medications   Medication Sig Start Date End Date Taking? Authorizing Provider  acetaminophen (TYLENOL) 325 MG tablet Take 2 tablets (650 mg total) by mouth every 6 (six) hours as needed for mild pain (or Fever >/= 101). 01/04/14   Alonza Bogus, MD  albuterol (PROVENTIL HFA;VENTOLIN HFA) 108 (90  BASE) MCG/ACT inhaler Inhale 2 puffs into the lungs every 4 (four) hours as needed. Shortness of breath 06/13/13   Alonza Bogus, MD  albuterol (PROVENTIL) (2.5 MG/3ML) 0.083% nebulizer solution Take 2.5 mg by nebulization 4 (four) times daily.    Historical Provider, MD  ALPRAZolam [name]) 1 MG tablet Take 1 mg by mouth 3 (three) times daily.     Historical Provider, MD  Amino Acids-Protein Hydrolys (FEEDING SUPPLEMENT, PRO-STAT SUGAR FREE 64,) LIQD Take 30 mLs by mouth 2 (two) times daily with a meal.    Historical Provider, MD  aspirin 325 MG tablet Take 1 tablet (325 mg total) by mouth daily. 06/13/13   Alonza Bogus, MD  atorvastatin (LIPITOR) 80 MG tablet Take 1 tablet (80 mg total) by mouth daily at 6 PM. 01/04/14   Alonza Bogus, MD  baclofen (LIORESAL) 10 MG tablet Take 5 mg by mouth 3 (three) times daily.    Historical Provider, MD  cholecalciferol (VITAMIN D) 1000 UNITS tablet Take 1,000 Units by mouth daily.    Historical Provider, MD  ciprofloxacin  (CIPRO) 250 MG tablet Take 1 tablet (250 mg total) by mouth 2 (two) times daily. 06/17/14   Alonza Bogus, MD  clopidogrel (PLAVIX) 75 MG tablet Take 1 tablet (75 mg total) by mouth daily with breakfast. 01/04/14   Alonza Bogus, MD  Cranberry 450 MG CAPS Take 1 capsule by mouth daily.    Historical Provider, MD  furosemide (LASIX) 20 MG tablet Take 20 mg by mouth daily.    Historical Provider, MD  guaiFENesin (MUCINEX) 600 MG 12 hr tablet Take 600 mg by mouth 2 (two) times daily.    Historical Provider, MD  levothyroxine (SYNTHROID, LEVOTHROID) 88 MCG tablet Take 1 tablet (88 mcg total) by mouth daily before breakfast. 06/13/13   Alonza Bogus, MD  loperamide (IMODIUM) 2 MG capsule Take 1 capsule (2 mg total) by mouth as needed for diarrhea or loose stools. 06/17/14   Alonza Bogus, MD  loratadine (CLARITIN) 10 MG tablet Take 10 mg by mouth daily.    Historical Provider, MD  nitroGLYCERIN (NITROSTAT) 0.4 MG SL tablet Place 1 tablet (0.4 mg total) under the tongue every 5 (five) minutes as needed for chest pain. 06/13/13   Alonza Bogus, MD  pantoprazole (PROTONIX) 40 MG tablet Take 1 tablet (40 mg total) by mouth daily. 06/13/13   Alonza Bogus, MD  polyethylene glycol [facility] Floria Raveling) packet Take 17 g by mouth daily.    Historical Provider, MD  potassium chloride SA (K-DUR,KLOR-CON) 20 MEQ tablet Take 10 mEq by mouth once.  01/04/14   Alonza Bogus, MD  predniSONE (DELTASONE) 5 MG tablet Take 5 mg by mouth daily with breakfast.    Historical Provider, MD  saccharomyces boulardii (FLORASTOR) 250 MG capsule Take 250 mg by mouth 2 (two) times daily.    Historical Provider, MD  sertraline (ZOLOFT) 100 MG tablet Take 1 tablet (100 mg total) by mouth daily. 08/13/13   Alonza Bogus, MD  temazepam (RESTORIL) 7.5 MG capsule Take 7.5 mg by mouth at bedtime as needed for sleep.    Historical Provider, MD  traMADol (ULTRAM) 50 MG tablet Take 50 mg by mouth every 6 (six) hours as needed for  moderate pain.    Historical Provider, MD  trolamine salicylate (ASPERCREME) 10 % cream Apply topically as needed for muscle pain. 06/17/14   Alonza Bogus, MD   BP 107/56  Pulse  87  Temp(Src) 98.2 F (36.8 C) (Oral)  Resp 16  SpO2 94% Physical Exam  Nursing note and vitals reviewed. Constitutional: She is oriented to person, place, and time. She appears well-developed and well-nourished. No distress.  HENT:  Head: Normocephalic and atraumatic.  Mouth/Throat: Oropharynx is clear and moist. No oropharyngeal exudate.  Eyes: Pupils are equal, round, and reactive to light.  Neck: Neck supple.  Cardiovascular: Normal rate.   Pulmonary/Chest: Effort normal.  Musculoskeletal: She exhibits edema.  Bilateral knees are noted to have contusions and mild swelling, but appear stable with varus and valgus stress and anterior/posterior.  Neurological: She is alert and oriented to person, place, and time. No cranial nerve deficit.  Pt with garbled speech that is minimally comprehensible. She moves all 4 extremities.  Skin: Skin is warm and dry. No rash noted.  Psychiatric: She has a normal mood and affect. Her behavior is normal.    ED Course  Procedures (including critical care time) DIAGNOSTIC STUDIES: Oxygen Saturation is 94% on [place], adequate by my interpretation.    COORDINATION OF CARE: 6:57 PM- X-ray of knee and CT head. Discussed treatment plan with pt. Pt agreed to plan.     Labs Review Labs Reviewed - No data to display  Imaging Review No results found.   EKG Interpretation None      MDM   Final diagnoses:  None    Patient is a 73 year old female sent from [place] home for evaluation of fall. She was found today on the floor lying supine and uncertain as to how she got there. She has bruises to her knees, however x-rays failed to reveal fracture. She has a history of cerebral palsy and is difficult to obtain a history from. A CT scan of the head and cervical  spine were obtained which were negative. As the family reports a decrease in her mental status, additional laboratory studies and urinalysis were obtained. These reveal evidence for a UTI. She will be treated with IV Levaquin in the ER and will be advised to continue this while at the nursing home. She has no white count and is afebrile and does not appear toxic or septic.  I personally performed the services described in this documentation, which was scribed in my presence. The recorded information has been reviewed and is accurate.        Veryl Speak, MD 09/11/14 [phone]

## 2014-09-11 NOTE — ED Notes (Signed)
Pt was at [place], was leaning over to get some water when she fell from her wheelchair, pt has bruising noted to bilateral knees, right hand area. Pt able to state who is in room with her, answer questions, family member's at bedside report that pt has not been "herself", having periods of confusion for the past two days. Pt's sister at bedside reports that pt was recently tx for uti with iv antibiotics and was acting the same way when the uti started. Dr Stark Jock in prior to RN, see edp assessment for further,

## 2014-09-11 NOTE — ED Notes (Signed)
CRITICAL VALUE ALERT  Critical value received:  co2 >45  Date of notification:  09-11-2014  Time of notification:  20:40  Critical value read back: yes  Nurse who received alert:  Rip Harbour RN   MD notified (1st page):  Dr Stark Jock  Time of first page:    MD notified (2nd page):  Time of second page:  Responding MD:  Dr Stark Jock  Time MD responded:  20:40

## 2014-09-11 NOTE — Discharge Instructions (Signed)
Levaquin 750mg  intravenously once daily for the next 6 days.  Return to the emergency department for severely altered mental status, high fever, severe pain, or other new and concerning symptoms.   Urinary Tract Infection Urinary tract infections (UTIs) can develop anywhere along your urinary tract. Your urinary tract is your body's drainage system for removing wastes and extra water. Your urinary tract includes two kidneys, two ureters, a bladder, and a urethra. Your kidneys are a pair of bean-shaped organs. Each kidney is about the size of your fist. They are located below your ribs, one on each side of your spine. CAUSES Infections are caused by microbes, which are microscopic organisms, including fungi, viruses, and bacteria. These organisms are so small that they can only be seen through a microscope. Bacteria are the microbes that most commonly cause UTIs. SYMPTOMS  Symptoms of UTIs may vary by age and gender of the patient and by the location of the infection. Symptoms in young women typically include a frequent and intense urge to urinate and a painful, burning feeling in the bladder or urethra during urination. Older women and men are more likely to be tired, shaky, and weak and have muscle aches and abdominal pain. A fever may mean the infection is in your kidneys. Other symptoms of a kidney infection include pain in your back or sides below the ribs, nausea, and vomiting. DIAGNOSIS To diagnose a UTI, your caregiver will ask you about your symptoms. Your caregiver also will ask to provide a urine sample. The urine sample will be tested for bacteria and white blood cells. White blood cells are made by your body to help fight infection. TREATMENT  Typically, UTIs can be treated with medication. Because most UTIs are caused by a bacterial infection, they usually can be treated with the use of antibiotics. The choice of antibiotic and length of treatment depend on your symptoms and the type of  bacteria causing your infection. HOME CARE INSTRUCTIONS  If you were prescribed antibiotics, take them exactly as your caregiver instructs you. Finish the medication even if you feel better after you have only taken some of the medication.  Drink enough water and fluids to keep your urine clear or pale yellow.  Avoid caffeine, tea, and carbonated beverages. They tend to irritate your bladder.  Empty your bladder often. Avoid holding urine for long periods of time.  Empty your bladder before and after sexual intercourse.  After a bowel movement, women should cleanse from front to back. Use each tissue only once. SEEK MEDICAL CARE IF:   You have back pain.  You develop a fever.  Your symptoms do not begin to resolve within 3 days. SEEK IMMEDIATE MEDICAL CARE IF:   You have severe back pain or lower abdominal pain.  You develop chills.  You have nausea or vomiting.  You have continued burning or discomfort with urination. MAKE SURE YOU:   Understand these instructions.  Will watch your condition.  Will get help right away if you are not doing well or get worse. Document Released: 09/15/2005 Document Revised: 06/06/2012 Document Reviewed: 01/14/2012 [facility] Patient Information 2015 [place], [place]. This information is not intended to replace advice given to you by your health care provider. Make sure you discuss any questions you have with your health care provider.  Fall Prevention in Hospitals As a hospital patient, your condition and the treatments you receive can increase your risk for falls. Some additional risk factors for falls in a hospital include:  Being  in an unfamiliar environment.  Being on bed rest.  Your surgery.  Taking certain medicines.  Your tubing requirements, such as intravenous (IV) therapy or catheters. It is important that you learn how to decrease fall risks while at the hospital. Below are important tips that can help prevent falls. SAFETY  TIPS FOR PREVENTING FALLS Talk about your risk of falling.  Ask your caregiver why you are at risk for falling. Is it your medicine, illness, tubing placement, or something else?  Make a plan with your caregiver to keep you safe from falls.  Ask your caregiver or pharmacist about side effect of your medicines. Some medicines can make you dizzy or affect your coordination. Ask for help.  Ask for help before getting out of bed. You may need to press your call button.  Ask for assistance in getting you safely to the toilet.  Ask for a walker or cane to be put at your bedside. Ask that most of the side rails on your bed be placed up before your caregiver leaves the room.  Ask family or friends to sit with you.  Ask for things that are out of your reach, such as your glasses, hearing aids, telephone, bedside table, or call button. Follow these tips to avoid falling:  Stay lying or seated, rather than standing, while waiting for help.  Wear rubber-soled slippers or shoes whenever you walk in the hospital.  Avoid quick, sudden movements.  Change positions slowly.  Sit on the side of your bed before standing.  Stand up slowly and wait before you start to walk.  Let your caregiver know if there is a spill on the floor.  Pay careful attention to the medical equipment, electrical cords, and tubes around you.  When you need help, use your call button by your bed or in the bathroom. Wait for one of your caregivers to help you.  If you feel dizzy or unsure of your footing, return to bed and wait for assistance.  Avoid being distracted by the TV, telephone, or another person in your room.  Do not lean or support yourself on rolling objects, such as IV poles or bedside tables. Document Released: 12/03/2000 Document Revised: 11/22/2012 Document Reviewed: 08/13/2012 [facility] Patient Information 2015 [place], [place]. This information is not intended to replace advice given to you by your  health care provider. Make sure you discuss any questions you have with your health care provider.

## 2014-09-11 NOTE — ED Notes (Signed)
[name] reports pt has cerebral palsy and has "spastic movements."  Reports was leaning over in a wheelchair and fell face first.  Reports wheel chair landed on pt and pt was hypoxic.  Reports room air 02 sat was in the 70's.  C/O pain in bilateral lower extremities.  EMS put on pt on 4 liters and sat increased to 93%.  Reports pt is on continuous o2 at 3liters.  Reports when pt was found in the floor she did not have her 02 on.  Staff reports pt's baseline is alert and verbal but has some confusion.

## 2014-09-11 NOTE — ED Notes (Signed)
Pt pulse ox range mid 80's while pt was sleeping,pulse ox moved to several different fingers, good wave noted, began stimulating pt, pulse ox increased to 94%, pt knows where she is at, who is with her, confused on day and month. Dr Stark Jock notified, no additional orders given

## 2014-09-12 LAB — COMPREHENSIVE METABOLIC PANEL
ALBUMIN: 3.3 g/dL — AB (ref 3.5–5.2)
ALT: 7 U/L (ref 0–35)
AST: 14 U/L (ref 0–37)
Alkaline Phosphatase: 59 U/L (ref 39–117)
BUN: 17 mg/dL (ref 6–23)
CALCIUM: 8.5 mg/dL (ref 8.4–10.5)
CREATININE: 0.37 mg/dL — AB (ref 0.50–1.10)
Chloride: 90 mEq/L — ABNORMAL LOW (ref 96–112)
GFR calc Af Amer: >90 mL/min (ref >90)
GFR calc non Af Amer: >90 mL/min (ref >90)
Glucose, Bld: 103 mg/dL — ABNORMAL HIGH (ref 70–99)
Potassium: 3.6 mEq/L — ABNORMAL LOW (ref 3.7–5.3)
SODIUM: 148 meq/L — AB (ref 137–147)
TOTAL PROTEIN: 6.6 g/dL (ref 6.0–8.3)
Total Bilirubin: 0.5 mg/dL (ref 0.3–1.2)

## 2014-11-12 ENCOUNTER — Emergency Department (HOSPITAL_COMMUNITY): Payer: Medicare Other

## 2014-11-12 ENCOUNTER — Emergency Department (HOSPITAL_COMMUNITY)
Admission: EM | Admit: 2014-11-12 | Discharge: 2014-11-12 | Disposition: A | Discharge status: Home / Self Care | Payer: Medicare Other | Attending: Emergency Medicine | Admitting: Emergency Medicine

## 2014-11-12 ENCOUNTER — Encounter (HOSPITAL_COMMUNITY): Payer: Self-pay | Admitting: Emergency Medicine

## 2014-11-12 DIAGNOSIS — Z79899 Other long term (current) drug therapy: Secondary | ICD-10-CM | POA: Insufficient documentation

## 2014-11-12 DIAGNOSIS — J45901 Unspecified asthma with (acute) exacerbation: Secondary | ICD-10-CM | POA: Insufficient documentation

## 2014-11-12 DIAGNOSIS — Z7982 Long term (current) use of aspirin: Secondary | ICD-10-CM | POA: Insufficient documentation

## 2014-11-12 DIAGNOSIS — E079 Disorder of thyroid, unspecified: Secondary | ICD-10-CM | POA: Insufficient documentation

## 2014-11-12 DIAGNOSIS — E669 Obesity, unspecified: Secondary | ICD-10-CM | POA: Diagnosis not present

## 2014-11-12 DIAGNOSIS — F419 Anxiety disorder, unspecified: Secondary | ICD-10-CM | POA: Diagnosis not present

## 2014-11-12 DIAGNOSIS — M1990 Unspecified osteoarthritis, unspecified site: Secondary | ICD-10-CM | POA: Insufficient documentation

## 2014-11-12 DIAGNOSIS — Z853 Personal history of malignant neoplasm of breast: Secondary | ICD-10-CM | POA: Diagnosis not present

## 2014-11-12 DIAGNOSIS — I10 Essential (primary) hypertension: Secondary | ICD-10-CM | POA: Diagnosis not present

## 2014-11-12 DIAGNOSIS — Z7952 Long term (current) use of systemic steroids: Secondary | ICD-10-CM | POA: Insufficient documentation

## 2014-11-12 DIAGNOSIS — Z91040 Latex allergy status: Secondary | ICD-10-CM | POA: Diagnosis not present

## 2014-11-12 DIAGNOSIS — J9611 Chronic respiratory failure with hypoxia: Secondary | ICD-10-CM | POA: Insufficient documentation

## 2014-11-12 DIAGNOSIS — Z8701 Personal history of pneumonia (recurrent): Secondary | ICD-10-CM | POA: Insufficient documentation

## 2014-11-12 DIAGNOSIS — E876 Hypokalemia: Secondary | ICD-10-CM | POA: Diagnosis not present

## 2014-11-12 DIAGNOSIS — J80 Acute respiratory distress syndrome: Secondary | ICD-10-CM | POA: Diagnosis present

## 2014-11-12 DIAGNOSIS — Z8673 Personal history of transient ischemic attack (TIA), and cerebral infarction without residual deficits: Secondary | ICD-10-CM | POA: Diagnosis not present

## 2014-11-12 DIAGNOSIS — K219 Gastro-esophageal reflux disease without esophagitis: Secondary | ICD-10-CM | POA: Diagnosis not present

## 2014-11-12 DIAGNOSIS — Z7902 Long term (current) use of antithrombotics/antiplatelets: Secondary | ICD-10-CM | POA: Diagnosis not present

## 2014-11-12 DIAGNOSIS — Z8742 Personal history of other diseases of the female genital tract: Secondary | ICD-10-CM | POA: Diagnosis not present

## 2014-11-12 DIAGNOSIS — R0602 Shortness of breath: Secondary | ICD-10-CM

## 2014-11-12 DIAGNOSIS — I252 Old myocardial infarction: Secondary | ICD-10-CM | POA: Insufficient documentation

## 2014-11-12 DIAGNOSIS — Z8619 Personal history of other infectious and parasitic diseases: Secondary | ICD-10-CM | POA: Diagnosis not present

## 2014-11-12 LAB — CBC WITH DIFFERENTIAL/PLATELET
BASOS ABS: 0 10*3/uL (ref 0.0–0.1)
Basophils Relative: 0 % (ref 0–1)
EOS PCT: 1 % (ref 0–5)
Eosinophils Absolute: 0.1 10*3/uL (ref 0.0–0.7)
HEMATOCRIT: 41.7 % (ref 36.0–46.0)
Hemoglobin: 11.5 g/dL — ABNORMAL LOW (ref 12.0–15.0)
LYMPHS ABS: 1 10*3/uL (ref 0.7–4.0)
Lymphocytes Relative: 18 % (ref 12–46)
MCH: 26.4 pg (ref 26.0–34.0)
MCHC: 27.6 g/dL — ABNORMAL LOW (ref 30.0–36.0)
MCV: 95.9 fL (ref 78.0–100.0)
MONO ABS: 0.5 10*3/uL (ref 0.1–1.0)
MONOS PCT: 9 % (ref 3–12)
NEUTROS ABS: 3.9 10*3/uL (ref 1.7–7.7)
Neutrophils Relative %: 72 % (ref 43–77)
Platelets: 114 10*3/uL — ABNORMAL LOW (ref 150–400)
RBC: 4.35 MIL/uL (ref 3.87–5.11)
RDW: 16.8 % — AB (ref 11.5–15.5)
WBC: 5.5 10*3/uL (ref 4.0–10.5)

## 2014-11-12 LAB — BASIC METABOLIC PANEL
BUN: 18 mg/dL (ref 6–23)
Calcium: 9.3 mg/dL (ref 8.4–10.5)
Chloride: 91 mEq/L — ABNORMAL LOW (ref 96–112)
Creatinine, Ser: 0.39 mg/dL — ABNORMAL LOW (ref 0.50–1.10)
GFR calc Af Amer: >90 mL/min (ref >90)
GFR calc non Af Amer: >90 mL/min (ref >90)
GLUCOSE: 113 mg/dL — AB (ref 70–99)
POTASSIUM: 4.2 meq/L (ref 3.7–5.3)
Sodium: 144 mEq/L (ref 137–147)

## 2014-11-12 LAB — TROPONIN I: Troponin I: <0.3 ng/mL (ref <0.30)

## 2014-11-12 LAB — PRO B NATRIURETIC PEPTIDE: Pro B Natriuretic peptide (BNP): 434.4 pg/mL — ABNORMAL HIGH (ref 0–125)

## 2014-11-12 NOTE — ED Notes (Signed)
Per [facility], patient is a resident at [facility] that began having low O2 sats.  EMS found patient flat on her back in bed and O2 at 2L via [place].  Patient was placed in a sitting position and given 10L NRB and patient began responding.  Patient sats at [facility] was 92%.  Patient requesting water and denies pain at this time.

## 2014-11-12 NOTE — Discharge Instructions (Signed)
Chronic Respiratory Failure °Respiratory failure is when your lungs are not working well and your breathing (respiratory) system fails. When respiratory failure occurs, it is difficult for your lungs to get enough oxygen or get rid of carbon dioxide or both. Respiratory failure can be life threatening.  °Respiratory failure can be acute or chronic. Acute respiratory failure is sudden, severe, and requires emergency medical treatment. Chronic respiratory failure is less severe, happens over time, and requires ongoing treatment.  °CAUSES  °Any problem affecting the heart or lungs can cause respiratory failure. Some of these causes may be: °· Chronic bronchitis and emphysema (COPD). °· Blood clot going to the lung (pulmonary embolism). °· Having water in the lungs caused by heart failure, lung injury, or infection (pulmonary edema). °· Collapsed lung (pneumothorax). °· Pneumonia. °· Pulmonary fibrosis. °· Obesity. °· Asthma. °· Heart failure. °· Any type of trauma to the chest that can make breathing difficult. °· Nerve or muscle diseases making chest movements difficult. °SYMPTOMS  °Signs and symptoms of chronic respiratory failure include: °· Shortness of breath (dyspnea) with or without activity. °· Rapid, fast breathing (tachypnea). °· Wheezing. °· Fast heart rate. °· Bluish color to the fingernail or toenail beds. °· Confusion or drowsiness or both. °DIAGNOSIS  °Initial diagnosis requires a thorough history and a physical exam by your health care provider. Additional tests may include: °· Chest X-ray. °· CT scan of your lungs. °· Ultrasound to check for blood clots. °· Blood tests, such as an arterial blood gas test (ABG). This is a blood test that looks at the oxygen and carbon dioxide levels in your arterial blood. °· Your vital signs will be taken. This includes your respiratory rate (how many times a minute you are breathing), oxygen saturation (this measures the oxygen level in your blood), heart rate, and  blood pressure. These numbers help your health care provider determine the next steps. °· Electrocardiogram. °TREATMENT  °Treatment of chronic respiratory failure depends on the cause of the respiratory failure. Treatment can include the following: °· Oxygen. Oxygen can be delivered through the following: °¨ Nasal cannula. This is small tubing that goes in your nose to give you oxygen. °¨ Face mask. A face mask covers your nose and mouth to give you oxygen. °· Medicine. Different medicines can be given to help with breathing. These can include: °¨ Nebulizers. Nebulizers deliver medicines to open the air passages (bronchodilators). These medicines help to open or relax the airways in the lungs so you can breathe better. They can also help loosen mucus from your lungs. °¨ Diuretics. Diuretic medicines can help you breathe better by getting rid of extra fluid in your body. °¨ Steroids. Steroid medicines can help decrease inflammation in your lungs. °· Chest tube. If you have a collapsed lung (pneumothorax), a chest tube is placed to help reinflate the lung. °· Non-invasive positive pressure ventilation (NPPV). This is a tight-fitting mask that goes over your nose and mouth. The mask has tubing that is attached to a machine. The machine blows air into the tubing, which helps to keep the tiny air sacs (alveoli) in your lungs open. This machine allows you to breathe on your own. °· Ventilator. A ventilator is a breathing machine. When on a ventilator, a breathing tube is put into the lungs. A ventilator is used when you can no longer breathe well enough on your own. You may have low oxygen levels or high carbon dioxide (CO2) levels in your blood. When you are on a   ventilator, sedation and pain medicines are given to make you sleep so your lungs can heal. °HOME CARE INSTRUCTIONS °· Follow your health care provider's directions about medicines and respiratory therapy. °· Quit smoking if you smoke. °SEEK MEDICAL CARE  IF: °· You have increasing shortness of breath and are less functional than you have been. °· You have increased sputum, wheezing, coughing, or loss of energy. °· You are on oxygen and are requiring more. °SEEK IMMEDIATE MEDICAL CARE IF: °· Your shortness of breath is significantly worse. °· You are unable to say more than a few words without having to catch your breath. °· You are much less functional. °MAKE SURE YOU: °· Understand these instructions. °· Will watch your condition. °· Will get help right away if you are not doing well or get worse. °Document Released: 12/06/2005 Document Revised: 04/22/2014 Document Reviewed: 10/04/2013 °[employer]® Patient Information ©2015 [employer]. This information is not intended to replace advice given to you by your health care provider. Make sure you discuss any questions you have with your health care provider. ° °

## 2014-11-12 NOTE — ED Provider Notes (Signed)
CSN: [id number]     Arrival date & time 11/12/14  2025 History   First MD Initiated Contact with Patient 11/12/14 2032     Chief Complaint  Patient presents with  . Respiratory Distress     (Consider location/radiation/quality/duration/timing/severity/associated sxs/prior Treatment) HPI  This is a 73 year old female with a history of cerebral palsy, hypertension, chronic respiratory failure and COPD on 2 L of oxygen who presents for evaluation for hypoxia. Per report, EMS was called to her living facility for hypoxia. Patient was satting 92% on 2 liters of oxygen. She was lying flat. When they sat her up, she improved. She received steroids in route. The last DuoNeb was at 4 PM. Patient has no complaints at this time. She is generally difficult to understand but denies any chest pain, shortness of breath, abdominal pain.  Past Medical History  Diagnosis Date  . Cerebral palsy   . Asthma   . Seasonal allergies   . Thyroid disease   . HTN (hypertension)   . Anxiety   . Arthritis   . Breast cancer     Right breast, infiltrating ductal.  . Anginal pain   . Osteoporosis 05/08/2012  . Osteoporosis 05/08/2012  . Stroke   . Angina at rest   . Chronic steroid use   . Pneumonia 09/2013  . Dysphagia   . Hypopotassemia   . Respiratory failure   . Generalized weakness   . Urinary retention   . Dysphagia   . Acute MI   . Hypopotassemia   . Sepsis   . Neurogenic bladder   . Reflux   . Obesity    Past Surgical History  Procedure Laterality Date  . Breast lumpectomy    . Radical abdominal hysterectomy    . Dental extraction    . Right hand surgery     Family History  Problem Relation Age of Onset  . Cancer    . Diabetes    . Arthritis    . Asthma    . Heart attack Father   . Heart attack Mother    History  Substance Use Topics  . Smoking status: Never Smoker   . Smokeless tobacco: Never Used  . Alcohol Use: No   OB History    No data available     Review of  Systems  Constitutional: Negative for fever.  Respiratory: Negative for cough, chest tightness and shortness of breath.        Reports of hypoxia  Cardiovascular: Negative for chest pain and leg swelling.  Gastrointestinal: Negative for abdominal pain.  Genitourinary: Negative for dysuria.  Neurological: Negative for headaches.  Psychiatric/Behavioral: Negative for confusion.  All other systems reviewed and are negative.     Allergies  Latex and Naproxen  Home Medications   Prior to Admission medications   Medication Sig Start Date End Date Taking? Authorizing Provider  acetaminophen (TYLENOL) 325 MG tablet Take 2 tablets (650 mg total) by mouth every 6 (six) hours as needed for mild pain (or Fever >/= 101). 01/04/14  Yes Alonza Bogus, MD  albuterol (PROVENTIL HFA;VENTOLIN HFA) 108 (90 BASE) MCG/ACT inhaler Inhale 2 puffs into the lungs every 4 (four) hours as needed. Shortness of breath 06/13/13  Yes Alonza Bogus, MD  albuterol (PROVENTIL) (2.5 MG/3ML) 0.083% nebulizer solution Take 2.5 mg by nebulization 4 (four) times daily.   Yes Historical Provider, MD  ALPRAZolam [name]) 1 MG tablet Take 1 mg by mouth 4 (four) times daily.    Yes  Historical Provider, MD  Amino Acids-Protein Hydrolys (FEEDING SUPPLEMENT, PRO-STAT SUGAR FREE 64,) LIQD Take 30 mLs by mouth 2 (two) times daily with a meal.   Yes Historical Provider, MD  aspirin 325 MG tablet Take 1 tablet (325 mg total) by mouth daily. 06/13/13  Yes Alonza Bogus, MD  atorvastatin (LIPITOR) 40 MG tablet Take 40 mg by mouth daily.   Yes Historical Provider, MD  baclofen (LIORESAL) 10 MG tablet Take 5 mg by mouth 3 (three) times daily.   Yes Historical Provider, MD  cholecalciferol (VITAMIN D) 1000 UNITS tablet Take 1,000 Units by mouth daily.   Yes Historical Provider, MD  clopidogrel (PLAVIX) 75 MG tablet Take 1 tablet (75 mg total) by mouth daily with breakfast. 01/04/14  Yes Alonza Bogus, MD  Cranberry 450 MG CAPS Take 1  capsule by mouth daily.   Yes Historical Provider, MD  guaiFENesin (MUCINEX) 600 MG 12 hr tablet Take 600 mg by mouth 2 (two) times daily.   Yes Historical Provider, MD  haloperidol (HALDOL) 0.5 MG tablet Take 0.5 mg by mouth daily.   Yes Historical Provider, MD  levothyroxine (SYNTHROID, LEVOTHROID) 88 MCG tablet Take 1 tablet (88 mcg total) by mouth daily before breakfast. 06/13/13  Yes Alonza Bogus, MD  loratadine (CLARITIN) 10 MG tablet Take 10 mg by mouth daily.   Yes Historical Provider, MD  nitroGLYCERIN (NITROSTAT) 0.4 MG SL tablet Place 1 tablet (0.4 mg total) under the tongue every 5 (five) minutes as needed for chest pain. 06/13/13  Yes Alonza Bogus, MD  pantoprazole (PROTONIX) 40 MG tablet Take 1 tablet (40 mg total) by mouth daily. 06/13/13  Yes Alonza Bogus, MD  polyethylene glycol [facility] / GLYCOLAX) packet Take 17 g by mouth daily.   Yes Historical Provider, MD  potassium chloride SA (K-DUR,KLOR-CON) 20 MEQ tablet Take 10 mEq by mouth daily.  01/04/14  Yes Alonza Bogus, MD  predniSONE (DELTASONE) 5 MG tablet Take 5 mg by mouth daily with breakfast.   Yes Historical Provider, MD  saccharomyces boulardii (FLORASTOR) 250 MG capsule Take 250 mg by mouth 2 (two) times daily.   Yes Historical Provider, MD  sertraline (ZOLOFT) 100 MG tablet Take 1 tablet (100 mg total) by mouth daily. 08/13/13  Yes Alonza Bogus, MD  temazepam (RESTORIL) 7.5 MG capsule Take 7.5 mg by mouth at bedtime as needed for sleep.   Yes Historical Provider, MD  traMADol (ULTRAM) 50 MG tablet Take 50 mg by mouth every 6 (six) hours as needed for moderate pain.   Yes Historical Provider, MD  trolamine salicylate (ASPERCREME) 10 % cream Apply topically as needed for muscle pain. 06/17/14  Yes Alonza Bogus, MD  furosemide (LASIX) 20 MG tablet Take 20 mg by mouth daily.    Historical Provider, MD  loperamide (IMODIUM) 2 MG capsule Take 1 capsule (2 mg total) by mouth as needed for diarrhea or loose  stools. Patient not taking: Reported on 11/12/2014 06/17/14   Alonza Bogus, MD   BP 108/67 mmHg  Pulse 82  Temp(Src) 98.9 F (37.2 C) (Oral)  Resp 20  Wt 224 lb (101.606 kg)  SpO2 95% Physical Exam  Constitutional:  Obese  HENT:  Head: Normocephalic and atraumatic.  Edentulous  Eyes: Pupils are equal, round, and reactive to light.  Cardiovascular: Normal rate, regular rhythm and normal heart sounds.   No murmur heard. Pulmonary/Chest: Effort normal and breath sounds normal. No respiratory distress. She has no wheezes.  She has no rales.  2 L nasal cannula in place  Abdominal: Soft. There is no tenderness.  Musculoskeletal: She exhibits no edema.  Neurological: She is alert.  Skin: Skin is warm and dry.  Psychiatric: She has a normal mood and affect.  Nursing note and vitals reviewed.   ED Course  Procedures (including critical care time) Labs Review Labs Reviewed  CBC WITH DIFFERENTIAL - Abnormal; Notable for the following:    Hemoglobin 11.5 (*)    MCHC 27.6 (*)    RDW 16.8 (*)    Platelets 114 (*)    All other components within normal limits  PRO B NATRIURETIC PEPTIDE - Abnormal; Notable for the following:    Pro B Natriuretic peptide (BNP) 434.4 (*)    All other components within normal limits  BASIC METABOLIC PANEL - Abnormal; Notable for the following:    Chloride 91 (*)    CO2 >45 (*)    Glucose, Bld 113 (*)    Creatinine, Ser 0.39 (*)    All other components within normal limits  TROPONIN I    Imaging Review Dg Chest 2 View  11/12/2014   CLINICAL DATA:  Chest pain, cough for 1 day, history of right breast cancer  EXAM: CHEST  2 VIEW  COMPARISON:  06/14/2014  FINDINGS: Cardiomegaly again noted. Central mild vascular congestion without convincing pulmonary edema. Streaky bilateral basilar atelectasis or infiltrate.  IMPRESSION: Central mild vascular congestion without convincing pulmonary edema. Streaky bilateral basilar atelectasis or infiltrate.    Electronically Signed   By: Lahoma Crocker M.D.   On: 11/12/2014 21:38     EKG Interpretation   Date/Time:  Tuesday November 12 2014 21:44:13 EST Ventricular Rate:  84 PR Interval:  162 QRS Duration: 99 QT Interval:  373 QTC Calculation: 441 R Axis:   99 Text Interpretation:  Sinus rhythm Right axis deviation No significant  change since last tracing Confirmed by Lakoda Mcanany  MD, [place] ([id number]) on  11/12/2014 10:20:09 PM      MDM   Final diagnoses:  SOB (shortness of breath)  Chronic respiratory failure with hypoxia    Reported hypoxia at her living facility. On my evaluation, she is on her baseline oxygen requirement and in no acute distress. Breath sounds are clear. Reported hypoxia was she was lying flat.  She is afebrile and vital signs reassuring. No evidence of leukocytosis. BMP with CO2 greater than 45 which is consistent with prior BMPs and reflective of her chronic respiratory failure. Chest x-ray shows no evidence of pneumonia. BNP is not elevated and patient does not appear volume overloaded and chest x-ray shows no evidence of overt pulmonary edema. Given the patient is at her baseline, feels she has been medically screened for acute emergent process. We'll discharge her back to her living facility.  After history, exam, and medical workup I feel the patient has been appropriately medically screened and is safe for discharge home. Pertinent diagnoses were discussed with the patient. Patient was given return precautions.     Merryl Hacker, MD 11/12/14 2300

## 2014-11-12 NOTE — ED Notes (Signed)
EMS here to transport patient to [facility].

## 2014-11-12 NOTE — ED Notes (Signed)
Patient discharged to [facility] via [facility].  Report called to April at [facility].

## 2014-11-12 NOTE — ED Notes (Signed)
CRITICAL VALUE ALERT  Critical value received:  CO2 >45  Date of notification:  11/12/14  Time of notification:  2151  Critical value read back:  yes  Nurse who received alert:  Kayleen Memos, RN  MD notified:  Dr. Dina Rich  Time:  2151

## 2014-11-28 ENCOUNTER — Encounter: Payer: Self-pay | Admitting: *Deleted

## 2014-11-28 ENCOUNTER — Encounter: Payer: Self-pay | Admitting: Cardiovascular Disease

## 2014-11-28 ENCOUNTER — Ambulatory Visit (INDEPENDENT_AMBULATORY_CARE_PROVIDER_SITE_OTHER): Payer: Medicare Other | Admitting: Cardiovascular Disease

## 2014-11-28 VITALS — BP 118/78 | HR 80 | Ht 62.0 in

## 2014-11-28 DIAGNOSIS — R079 Chest pain, unspecified: Secondary | ICD-10-CM

## 2014-11-28 DIAGNOSIS — I252 Old myocardial infarction: Secondary | ICD-10-CM

## 2014-11-28 MED ORDER — RANOLAZINE ER 500 MG PO TB12: 500.0000 mg | ORAL_TABLET | Freq: Two times a day (BID) | ORAL | Status: AC

## 2014-11-28 NOTE — Patient Instructions (Signed)
Your physician recommends that you schedule a follow-up appointment in: 1 month with Dr. Bronson Ing  Your physician has recommended you make the following change in your medication:   START RANEXA [place] physician has requested that you have a lexiscan myoview. For further information please visit [email or web address]. Please follow instruction sheet, as given.  Thank you for choosing [facility]!!

## 2014-11-28 NOTE — Progress Notes (Signed)
Patient ID: [name], female   DOB: Jul 10, 1941, 73 y.o.   MRN: [id number]      SUBJECTIVE: The patient has a history of chronic respiratory failure/COPD and is on oxygen, hypertension, cerebral palsy, NSTEMI, and CVA. She had a negative pharmacologic stress nuclear myocardial study in June 2014. She has had two episodes of left-sided chest pain radiating to the back in the past two days, each mostly relieved with one SL nitro tablet. It was associated with mild shortness of breath, no nausea.  Review of Systems: As per "subjective", otherwise negative.  Allergies  Allergen Reactions  . Latex     Provided via MAR  . Naproxen Other (See Comments)    Unknown-Provided via MAR    Current Outpatient Prescriptions  Medication Sig Dispense Refill  . acetaminophen (TYLENOL) 325 MG tablet Take 2 tablets (650 mg total) by mouth every 6 (six) hours as needed for mild pain (or Fever >/= 101).    Marland Kitchen albuterol (PROVENTIL HFA;VENTOLIN HFA) 108 (90 BASE) MCG/ACT inhaler Inhale 2 puffs into the lungs every 4 (four) hours as needed. Shortness of breath 1 Inhaler 12  . albuterol (PROVENTIL) (2.5 MG/3ML) 0.083% nebulizer solution Take 2.5 mg by nebulization 4 (four) times daily.    Marland Kitchen ALPRAZolam (XANAX) 1 MG tablet Take 1 mg by mouth 4 (four) times daily.     . Amino Acids-Protein Hydrolys (FEEDING SUPPLEMENT, PRO-STAT SUGAR FREE 64,) LIQD Take 30 mLs by mouth 2 (two) times daily with a meal.    . aspirin 325 MG tablet Take 1 tablet (325 mg total) by mouth daily. 30 tablet 12  . atorvastatin (LIPITOR) 40 MG tablet Take 40 mg by mouth daily.    . baclofen (LIORESAL) 10 MG tablet Take 5 mg by mouth 3 (three) times daily.    . cholecalciferol (VITAMIN D) 1000 UNITS tablet Take 1,000 Units by mouth daily.    . clopidogrel (PLAVIX) 75 MG tablet Take 1 tablet (75 mg total) by mouth daily with breakfast.    . Cranberry 450 MG CAPS Take 1 capsule by mouth daily.    . furosemide (LASIX) 20 MG tablet Take 20 mg  by mouth daily.    Marland Kitchen guaiFENesin (MUCINEX) 600 MG 12 hr tablet Take 600 mg by mouth 2 (two) times daily.    . haloperidol (HALDOL) 0.5 MG tablet Take 0.5 mg by mouth daily.    Marland Kitchen levothyroxine (SYNTHROID, LEVOTHROID) 88 MCG tablet Take 1 tablet (88 mcg total) by mouth daily before breakfast. 30 tablet 12  . loperamide (IMODIUM) 2 MG capsule Take 1 capsule (2 mg total) by mouth as needed for diarrhea or loose stools. 30 capsule 0  . loratadine (CLARITIN) 10 MG tablet Take 10 mg by mouth daily.    . nitroGLYCERIN (NITROSTAT) 0.4 MG SL tablet Place 1 tablet (0.4 mg total) under the tongue every 5 (five) minutes as needed for chest pain. 25 tablet 12  . pantoprazole (PROTONIX) 40 MG tablet Take 1 tablet (40 mg total) by mouth daily. 30 tablet 12  . polyethylene glycol (MIRALAX / GLYCOLAX) packet Take 17 g by mouth daily.    . potassium chloride SA (K-DUR,KLOR-CON) 20 MEQ tablet Take 10 mEq by mouth daily.     . predniSONE (DELTASONE) 5 MG tablet Take 5 mg by mouth daily with breakfast.    . saccharomyces boulardii (FLORASTOR) 250 MG capsule Take 250 mg by mouth 2 (two) times daily.    . sertraline (ZOLOFT) 100 MG tablet Take  1 tablet (100 mg total) by mouth daily.    . temazepam (RESTORIL) 7.5 MG capsule Take 7.5 mg by mouth at bedtime as needed for sleep.    . traMADol (ULTRAM) 50 MG tablet Take 50 mg by mouth every 6 (six) hours as needed for moderate pain.    Marland Kitchen trolamine salicylate (ASPERCREME) 10 % cream Apply topically as needed for muscle pain. 85 g 0   No current facility-administered medications for this visit.    Past Medical History  Diagnosis Date  . Cerebral palsy   . Asthma   . Seasonal allergies   . Thyroid disease   . HTN (hypertension)   . Anxiety   . Arthritis   . Breast cancer     Right breast, infiltrating ductal.  . Anginal pain   . Osteoporosis 05/08/2012  . Osteoporosis 05/08/2012  . Stroke   . Angina at rest   . Chronic steroid use   . Pneumonia 09/2013  .  Dysphagia   . Hypopotassemia   . Respiratory failure   . Generalized weakness   . Urinary retention   . Dysphagia   . Acute MI   . Hypopotassemia   . Sepsis   . Neurogenic bladder   . Reflux   . Obesity     Past Surgical History  Procedure Laterality Date  . Breast lumpectomy    . Radical abdominal hysterectomy    . Dental extraction    . Right hand surgery      History   Social History  . Marital Status: Widowed    Spouse Name: N/A    Number of Children: N/A  . Years of Education: college   Occupational History  . disabled    Social History Main Topics  . Smoking status: Never Smoker   . Smokeless tobacco: Never Used  . Alcohol Use: No  . Drug Use: No  . Sexual Activity: No   Other Topics Concern  . Not on file   Social History Narrative     Filed Vitals:   11/28/14 1603  BP: 118/78  Pulse: 80  Height: 5\' 2"  (1.575 m)    PHYSICAL EXAM General: NAD HEENT: Tardive dyskinesia. Neck: No JVD, no thyromegaly. Lungs: Diminished sounds b/l with faint end-expiratory wheezes. CV: Nondisplaced PMI.  Regular rate and rhythm, normal S1/S2, no S3/S4, no murmur. No pretibial or periankle edema.   Abdomen: Soft, nontender, no hepatosplenomegaly, no distention.  Neurologic: Alert and oriented.  Psych: Normal affect. Skin: Normal. Musculoskeletal: No gross deformities.  ECG: Most recent ECG reviewed.    ASSESSMENT AND PLAN: 1. Chest pain in the context of prior NSTEMI which occurred in the setting of acute respiratory failure secondary to influenza A and a healthcare associated pneumonia: Prior nuclear MPI study in 05/2013 was normal. Given symptom recurrence, I will optimize medical therapy by adding Ranexa 500 mg bid. I will also obtain a Lexiscan Cardiolite stress test.  Dispo: f/u one month.  Kate Sable, M.D., F.A.C.C.

## 2014-12-02 ENCOUNTER — Other Ambulatory Visit: Payer: Self-pay | Admitting: Cardiovascular Disease

## 2014-12-02 ENCOUNTER — Telehealth: Payer: Self-pay | Admitting: *Deleted

## 2014-12-02 NOTE — Telephone Encounter (Signed)
Received prior auth request for Ranexa, faxed back and received approval. Faxed approval to [employer]. Approval letter to be scanned.

## 2014-12-02 NOTE — Telephone Encounter (Signed)
Faxed prior authorization to [facility].

## 2014-12-02 NOTE — Telephone Encounter (Signed)
Prior Authorization / tgs

## 2014-12-10 ENCOUNTER — Encounter (HOSPITAL_COMMUNITY): Payer: Medicare Other

## 2014-12-10 ENCOUNTER — Ambulatory Visit (HOSPITAL_COMMUNITY): Admission: RE | Admit: 2014-12-10 | Payer: Medicare Other | Source: Ambulatory Visit

## 2014-12-18 ENCOUNTER — Encounter (HOSPITAL_COMMUNITY)
Admission: RE | Admit: 2014-12-18 | Discharge: 2014-12-18 | Disposition: A | Discharge status: Home / Self Care | Payer: Medicare Other | Source: Ambulatory Visit | Attending: Cardiovascular Disease | Admitting: Cardiovascular Disease

## 2014-12-18 ENCOUNTER — Telehealth: Payer: Self-pay | Admitting: *Deleted

## 2014-12-18 ENCOUNTER — Ambulatory Visit (HOSPITAL_COMMUNITY)
Admission: RE | Admit: 2014-12-18 | Discharge: 2014-12-18 | Disposition: A | Discharge status: Home / Self Care | Payer: Medicare Other | Source: Ambulatory Visit | Attending: Cardiovascular Disease | Admitting: Cardiovascular Disease

## 2014-12-18 ENCOUNTER — Encounter (HOSPITAL_COMMUNITY): Payer: Self-pay

## 2014-12-18 DIAGNOSIS — I252 Old myocardial infarction: Secondary | ICD-10-CM

## 2014-12-18 DIAGNOSIS — R0789 Other chest pain: Secondary | ICD-10-CM | POA: Insufficient documentation

## 2014-12-18 DIAGNOSIS — R0602 Shortness of breath: Secondary | ICD-10-CM | POA: Diagnosis not present

## 2014-12-18 DIAGNOSIS — R079 Chest pain, unspecified: Secondary | ICD-10-CM | POA: Diagnosis not present

## 2014-12-18 MED ORDER — REGADENOSON 0.4 MG/5ML IV SOLN
0.4000 mg | Freq: Once | INTRAVENOUS | Status: AC | PRN
  Administered 2014-12-18: 0.4 mg via INTRAVENOUS

## 2014-12-18 MED ORDER — TECHNETIUM TC 99M SESTAMIBI - CARDIOLITE
30.0000 | Freq: Once | INTRAVENOUS | Status: AC | PRN
  Administered 2014-12-18: 30 via INTRAVENOUS

## 2014-12-18 MED ORDER — SODIUM CHLORIDE 0.9 % IJ SOLN
INTRAMUSCULAR | Status: AC
  Administered 2014-12-18: 10 mL via INTRAVENOUS
  Filled 2014-12-18: qty 10

## 2014-12-18 MED ORDER — REGADENOSON 0.4 MG/5ML IV SOLN
INTRAVENOUS | Status: AC
  Administered 2014-12-18: 0.4 mg via INTRAVENOUS
  Filled 2014-12-18: qty 5

## 2014-12-18 MED ORDER — SODIUM CHLORIDE 0.9 % IJ SOLN
10.0000 mL | INTRAMUSCULAR | Status: DC | PRN
  Administered 2014-12-18: 10 mL via INTRAVENOUS
  Filled 2014-12-18: qty 10

## 2014-12-18 MED ORDER — TECHNETIUM TC 99M SESTAMIBI GENERIC - CARDIOLITE
10.0000 | Freq: Once | INTRAVENOUS | Status: AC | PRN
  Administered 2014-12-18: 10 via INTRAVENOUS

## 2014-12-18 NOTE — Telephone Encounter (Signed)
[facility]hell at [facility] notified of results, forwarded to Dr. Luan Pulling

## 2014-12-18 NOTE — Telephone Encounter (Signed)
-----   Message from Herminio Commons, MD sent at 12/18/2014  3:59 PM EST ----- No ischemia, low risk.

## 2014-12-18 NOTE — Progress Notes (Signed)
Stress Lab Nurses Notes - [name]  [name] 12/18/2014 Reason for doing test: Chest Pain Type of test: Wille Glaser Nurse performing test: Gerrit Halls, RN Nuclear Medicine Tech: Redmond Baseman Echo Tech: Not Applicable MD performing test: P. Ross/D.Dunn PA Family MD: Luan Pulling Test explained and consent signed: Yes.   IV started: Saline lock flushed, No redness or edema and Saline lock started in radiology Symptoms: None Treatment/Intervention: None Reason test stopped: protocol completed After recovery IV was: Discontinued via X-ray tech and No redness or edema Patient to return to Nuc. Med at : 12:45 Patient discharged: To another Medical Facility Health and safety inspector) Patient's Condition upon discharge was: stable Comments: During test BP 98/62 & HR 99. Recovery BP 110/66 & HR 91.  Symptoms resolved in recovery. Sister accompany (at bedside). [name] T

## 2014-12-20 DIAGNOSIS — A498 Other bacterial infections of unspecified site: Secondary | ICD-10-CM

## 2014-12-20 DIAGNOSIS — B958 Unspecified staphylococcus as the cause of diseases classified elsewhere: Secondary | ICD-10-CM

## 2014-12-20 HISTORY — DX: Unspecified staphylococcus as the cause of diseases classified elsewhere: B95.8

## 2014-12-20 HISTORY — DX: Other bacterial infections of unspecified site: A49.8

## 2014-12-24 ENCOUNTER — Emergency Department (HOSPITAL_COMMUNITY): Payer: Medicare Other

## 2014-12-24 ENCOUNTER — Inpatient Hospital Stay (HOSPITAL_COMMUNITY)
Admission: EM | Admit: 2014-12-24 | Discharge: 2015-01-10 | DRG: 870 | Disposition: A | Discharge status: Skilled Nursing Facility | Payer: Medicare Other | Attending: Pulmonary Disease | Admitting: Pulmonary Disease

## 2014-12-24 ENCOUNTER — Inpatient Hospital Stay (HOSPITAL_COMMUNITY): Payer: Medicare Other

## 2014-12-24 ENCOUNTER — Encounter (HOSPITAL_COMMUNITY): Payer: Self-pay

## 2014-12-24 DIAGNOSIS — E876 Hypokalemia: Secondary | ICD-10-CM | POA: Diagnosis not present

## 2014-12-24 DIAGNOSIS — Z7401 Bed confinement status: Secondary | ICD-10-CM

## 2014-12-24 DIAGNOSIS — N390 Urinary tract infection, site not specified: Secondary | ICD-10-CM

## 2014-12-24 DIAGNOSIS — E860 Dehydration: Secondary | ICD-10-CM | POA: Diagnosis present

## 2014-12-24 DIAGNOSIS — Z452 Encounter for adjustment and management of vascular access device: Secondary | ICD-10-CM

## 2014-12-24 DIAGNOSIS — A047 Enterocolitis due to Clostridium difficile: Secondary | ICD-10-CM | POA: Diagnosis not present

## 2014-12-24 DIAGNOSIS — E873 Alkalosis: Secondary | ICD-10-CM | POA: Diagnosis not present

## 2014-12-24 DIAGNOSIS — Z853 Personal history of malignant neoplasm of breast: Secondary | ICD-10-CM

## 2014-12-24 DIAGNOSIS — R1310 Dysphagia, unspecified: Secondary | ICD-10-CM | POA: Diagnosis present

## 2014-12-24 DIAGNOSIS — Z91040 Latex allergy status: Secondary | ICD-10-CM | POA: Diagnosis not present

## 2014-12-24 DIAGNOSIS — J9690 Respiratory failure, unspecified, unspecified whether with hypoxia or hypercapnia: Secondary | ICD-10-CM

## 2014-12-24 DIAGNOSIS — I252 Old myocardial infarction: Secondary | ICD-10-CM

## 2014-12-24 DIAGNOSIS — Z7902 Long term (current) use of antithrombotics/antiplatelets: Secondary | ICD-10-CM | POA: Diagnosis not present

## 2014-12-24 DIAGNOSIS — Z7982 Long term (current) use of aspirin: Secondary | ICD-10-CM | POA: Diagnosis not present

## 2014-12-24 DIAGNOSIS — Z8673 Personal history of transient ischemic attack (TIA), and cerebral infarction without residual deficits: Secondary | ICD-10-CM | POA: Diagnosis not present

## 2014-12-24 DIAGNOSIS — J449 Chronic obstructive pulmonary disease, unspecified: Secondary | ICD-10-CM | POA: Diagnosis present

## 2014-12-24 DIAGNOSIS — E43 Unspecified severe protein-calorie malnutrition: Secondary | ICD-10-CM | POA: Diagnosis present

## 2014-12-24 DIAGNOSIS — B958 Unspecified staphylococcus as the cause of diseases classified elsewhere: Secondary | ICD-10-CM | POA: Diagnosis present

## 2014-12-24 DIAGNOSIS — R451 Restlessness and agitation: Secondary | ICD-10-CM | POA: Diagnosis present

## 2014-12-24 DIAGNOSIS — M810 Age-related osteoporosis without current pathological fracture: Secondary | ICD-10-CM | POA: Diagnosis present

## 2014-12-24 DIAGNOSIS — J9622 Acute and chronic respiratory failure with hypercapnia: Secondary | ICD-10-CM | POA: Diagnosis present

## 2014-12-24 DIAGNOSIS — I959 Hypotension, unspecified: Secondary | ICD-10-CM | POA: Diagnosis present

## 2014-12-24 DIAGNOSIS — A419 Sepsis, unspecified organism: Secondary | ICD-10-CM | POA: Diagnosis present

## 2014-12-24 DIAGNOSIS — G9341 Metabolic encephalopathy: Secondary | ICD-10-CM

## 2014-12-24 DIAGNOSIS — I10 Essential (primary) hypertension: Secondary | ICD-10-CM | POA: Diagnosis present

## 2014-12-24 DIAGNOSIS — Z7952 Long term (current) use of systemic steroids: Secondary | ICD-10-CM

## 2014-12-24 DIAGNOSIS — Z90710 Acquired absence of both cervix and uterus: Secondary | ICD-10-CM

## 2014-12-24 DIAGNOSIS — I468 Cardiac arrest due to other underlying condition: Secondary | ICD-10-CM | POA: Diagnosis not present

## 2014-12-24 DIAGNOSIS — G809 Cerebral palsy, unspecified: Secondary | ICD-10-CM

## 2014-12-24 DIAGNOSIS — R0902 Hypoxemia: Secondary | ICD-10-CM | POA: Diagnosis present

## 2014-12-24 DIAGNOSIS — E039 Hypothyroidism, unspecified: Secondary | ICD-10-CM | POA: Diagnosis present

## 2014-12-24 DIAGNOSIS — K219 Gastro-esophageal reflux disease without esophagitis: Secondary | ICD-10-CM | POA: Diagnosis present

## 2014-12-24 DIAGNOSIS — F419 Anxiety disorder, unspecified: Secondary | ICD-10-CM | POA: Diagnosis present

## 2014-12-24 DIAGNOSIS — Z6834 Body mass index (BMI) 34.0-34.9, adult: Secondary | ICD-10-CM

## 2014-12-24 DIAGNOSIS — J9602 Acute respiratory failure with hypercapnia: Secondary | ICD-10-CM

## 2014-12-24 DIAGNOSIS — I639 Cerebral infarction, unspecified: Secondary | ICD-10-CM

## 2014-12-24 DIAGNOSIS — R092 Respiratory arrest: Secondary | ICD-10-CM

## 2014-12-24 DIAGNOSIS — Z0189 Encounter for other specified special examinations: Secondary | ICD-10-CM

## 2014-12-24 DIAGNOSIS — B3749 Other urogenital candidiasis: Secondary | ICD-10-CM | POA: Diagnosis present

## 2014-12-24 DIAGNOSIS — Z4659 Encounter for fitting and adjustment of other gastrointestinal appliance and device: Secondary | ICD-10-CM

## 2014-12-24 DIAGNOSIS — I5033 Acute on chronic diastolic (congestive) heart failure: Secondary | ICD-10-CM | POA: Diagnosis not present

## 2014-12-24 DIAGNOSIS — Y95 Nosocomial condition: Secondary | ICD-10-CM | POA: Diagnosis present

## 2014-12-24 DIAGNOSIS — J189 Pneumonia, unspecified organism: Secondary | ICD-10-CM | POA: Diagnosis not present

## 2014-12-24 DIAGNOSIS — N319 Neuromuscular dysfunction of bladder, unspecified: Secondary | ICD-10-CM | POA: Diagnosis present

## 2014-12-24 DIAGNOSIS — E861 Hypovolemia: Secondary | ICD-10-CM | POA: Diagnosis present

## 2014-12-24 DIAGNOSIS — Z79899 Other long term (current) drug therapy: Secondary | ICD-10-CM

## 2014-12-24 DIAGNOSIS — Z9911 Dependence on respirator [ventilator] status: Secondary | ICD-10-CM | POA: Diagnosis not present

## 2014-12-24 DIAGNOSIS — I2510 Atherosclerotic heart disease of native coronary artery without angina pectoris: Secondary | ICD-10-CM | POA: Diagnosis present

## 2014-12-24 DIAGNOSIS — Z888 Allergy status to other drugs, medicaments and biological substances status: Secondary | ICD-10-CM | POA: Diagnosis not present

## 2014-12-24 DIAGNOSIS — J9611 Chronic respiratory failure with hypoxia: Secondary | ICD-10-CM | POA: Diagnosis present

## 2014-12-24 LAB — CBC WITH DIFFERENTIAL/PLATELET
Basophils Absolute: 0 10*3/uL (ref 0.0–0.1)
Basophils Relative: 0 % (ref 0–1)
EOS ABS: 0 10*3/uL (ref 0.0–0.7)
EOS PCT: 0 % (ref 0–5)
HCT: 45.8 % (ref 36.0–46.0)
HEMOGLOBIN: 11.9 g/dL — AB (ref 12.0–15.0)
LYMPHS ABS: 0.8 10*3/uL (ref 0.7–4.0)
LYMPHS PCT: 14 % (ref 12–46)
MCH: 27 pg (ref 26.0–34.0)
MCHC: 26 g/dL — ABNORMAL LOW (ref 30.0–36.0)
MCV: 103.9 fL — AB (ref 78.0–100.0)
MONOS PCT: 14 % — AB (ref 3–12)
Monocytes Absolute: 0.8 10*3/uL (ref 0.1–1.0)
Neutro Abs: 3.9 10*3/uL (ref 1.7–7.7)
Neutrophils Relative %: 72 % (ref 43–77)
Platelets: 141 10*3/uL — ABNORMAL LOW (ref 150–400)
RBC: 4.41 MIL/uL (ref 3.87–5.11)
RDW: 17.3 % — ABNORMAL HIGH (ref 11.5–15.5)
WBC: 5.4 10*3/uL (ref 4.0–10.5)

## 2014-12-24 LAB — BASIC METABOLIC PANEL
BUN: 23 mg/dL (ref 6–23)
CHLORIDE: 91 meq/L — AB (ref 96–112)
Calcium: 8.8 mg/dL (ref 8.4–10.5)
Creatinine, Ser: 0.51 mg/dL (ref 0.50–1.10)
GFR calc non Af Amer: >90 mL/min (ref >90)
GLUCOSE: 117 mg/dL — AB (ref 70–99)
Potassium: 3.5 mmol/L (ref 3.5–5.1)
Sodium: 153 mmol/L — ABNORMAL HIGH (ref 135–145)

## 2014-12-24 LAB — URINALYSIS, ROUTINE W REFLEX MICROSCOPIC
BILIRUBIN URINE: NEGATIVE
GLUCOSE, UA: NEGATIVE mg/dL
KETONES UR: NEGATIVE mg/dL
Nitrite: NEGATIVE
PH: 5.5 (ref 5.0–8.0)
Protein, ur: 30 mg/dL — AB
Specific Gravity, Urine: >1.03 — ABNORMAL HIGH (ref 1.005–1.030)
Urobilinogen, UA: 0.2 mg/dL (ref 0.0–1.0)

## 2014-12-24 LAB — BLOOD GAS, ARTERIAL
Acid-Base Excess: 23.6 mmol/L — ABNORMAL HIGH (ref 0.0–2.0)
Bicarbonate: 49.2 mEq/L — ABNORMAL HIGH (ref 20.0–24.0)
Drawn by: 21310
Drawn by: 21310
FIO2: 55 %
FIO2: 50 %
MECHVT: 550 mL
O2 SAT: 93.2 %
O2 Saturation: 99 %
PATIENT TEMPERATURE: 37
PCO2 ART: 62.5 mmHg — AB (ref 35.0–45.0)
PEEP/CPAP: 5 cmH2O
RATE: 18 resp/min
TCO2: 14.9 mmol/L (ref 0–100)
TCO2: 44.5 mmol/L (ref 0–100)
pH, Arterial: 7.174 — CL (ref 7.350–7.450)
pH, Arterial: 7.508 — ABNORMAL HIGH (ref 7.350–7.450)
pO2, Arterial: 78.5 mmHg — ABNORMAL LOW (ref 80.0–100.0)
pO2, Arterial: 118 mmHg — ABNORMAL HIGH (ref 80.0–100.0)

## 2014-12-24 LAB — TROPONIN I: TROPONIN I: 0.03 ng/mL (ref <0.031)

## 2014-12-24 LAB — BRAIN NATRIURETIC PEPTIDE: B Natriuretic Peptide: 126 pg/mL — ABNORMAL HIGH (ref 0.0–100.0)

## 2014-12-24 LAB — I-STAT CG4 LACTIC ACID, ED: Lactic Acid, Venous: 1.03 mmol/L (ref 0.5–2.2)

## 2014-12-24 LAB — URINE MICROSCOPIC-ADD ON

## 2014-12-24 LAB — MRSA PCR SCREENING: MRSA BY PCR: POSITIVE — AB

## 2014-12-24 MED ORDER — VANCOMYCIN HCL 10 G IV SOLR
1250.0000 mg | Freq: Two times a day (BID) | INTRAVENOUS | Status: DC
  Administered 2014-12-24 – 2014-12-27 (×7): 1250 mg via INTRAVENOUS
  Filled 2014-12-24 (×13): qty 1250

## 2014-12-24 MED ORDER — ENOXAPARIN SODIUM 40 MG/0.4ML ~~LOC~~ SOLN: 40.0000 mg | SUBCUTANEOUS | Status: DC

## 2014-12-24 MED ORDER — PANTOPRAZOLE SODIUM 40 MG IV SOLR
40.0000 mg | INTRAVENOUS | Status: DC
  Administered 2014-12-24 – 2015-01-01 (×9): 40 mg via INTRAVENOUS
  Filled 2014-12-24 (×9): qty 40

## 2014-12-24 MED ORDER — PROPOFOL 10 MG/ML IV EMUL
INTRAVENOUS | Status: AC
  Administered 2014-12-24: 5 ug/kg/min via INTRAVENOUS
  Filled 2014-12-24: qty 100

## 2014-12-24 MED ORDER — SODIUM CHLORIDE 0.9 % IV BOLUS (SEPSIS)
500.0000 mL | Freq: Once | INTRAVENOUS | Status: AC
  Administered 2014-12-24: 500 mL via INTRAVENOUS

## 2014-12-24 MED ORDER — SODIUM CHLORIDE 0.45 % IV SOLN
INTRAVENOUS | Status: DC
  Administered 2014-12-24 – 2014-12-29 (×8): via INTRAVENOUS

## 2014-12-24 MED ORDER — SODIUM CHLORIDE 0.9 % IV SOLN
250.0000 mL | INTRAVENOUS | Status: DC | PRN
  Administered 2014-12-31: 250 mL via INTRAVENOUS

## 2014-12-24 MED ORDER — ROCURONIUM BROMIDE 50 MG/5ML IV SOLN
INTRAVENOUS | Status: AC
  Administered 2014-12-24: 10 mg
  Filled 2014-12-24: qty 2

## 2014-12-24 MED ORDER — MUPIROCIN 2 % EX OINT
1.0000 "application " | TOPICAL_OINTMENT | Freq: Two times a day (BID) | CUTANEOUS | Status: AC
  Administered 2014-12-24 – 2014-12-29 (×10): 1 via NASAL
  Filled 2014-12-24: qty 22

## 2014-12-24 MED ORDER — CHLORHEXIDINE GLUCONATE 0.12 % MT SOLN
15.0000 mL | Freq: Two times a day (BID) | OROMUCOSAL | Status: DC
Start: 2014-12-24 — End: 2015-01-01
  Administered 2014-12-24 – 2015-01-01 (×16): 15 mL via OROMUCOSAL
  Filled 2014-12-24 (×16): qty 15

## 2014-12-24 MED ORDER — DEXTROSE 5 % IV SOLN
2.0000 g | Freq: Once | INTRAVENOUS | Status: AC
  Administered 2014-12-24: 2 g via INTRAVENOUS
  Filled 2014-12-24: qty 2

## 2014-12-24 MED ORDER — LIDOCAINE HCL (CARDIAC) 20 MG/ML IV SOLN
INTRAVENOUS | Status: AC
  Filled 2014-12-24: qty 5

## 2014-12-24 MED ORDER — SODIUM CHLORIDE 0.9 % IV SOLN
Freq: Once | INTRAVENOUS | Status: AC
  Administered 2014-12-24: 17:00:00 via INTRAVENOUS

## 2014-12-24 MED ORDER — VECURONIUM BROMIDE 10 MG IV SOLR
INTRAVENOUS | Status: AC
  Administered 2014-12-24: 10 mg via INTRAVENOUS
  Filled 2014-12-24: qty 10

## 2014-12-24 MED ORDER — SUCCINYLCHOLINE CHLORIDE 20 MG/ML IJ SOLN
INTRAMUSCULAR | Status: AC
  Administered 2014-12-24: 150 mg
  Filled 2014-12-24: qty 1

## 2014-12-24 MED ORDER — PROPOFOL 10 MG/ML IV EMUL
INTRAVENOUS | Status: AC
  Administered 2014-12-24: 1000 mg
  Filled 2014-12-24: qty 100

## 2014-12-24 MED ORDER — PROPOFOL 10 MG/ML IV EMUL
5.0000 ug/kg/min | Freq: Once | INTRAVENOUS | Status: AC
  Administered 2014-12-24: 5 ug/kg/min via INTRAVENOUS
  Administered 2014-12-24: 50 ug/kg/min via INTRAVENOUS

## 2014-12-24 MED ORDER — CHLORHEXIDINE GLUCONATE CLOTH 2 % EX PADS
6.0000 | MEDICATED_PAD | Freq: Every day | CUTANEOUS | Status: AC
  Administered 2014-12-25 – 2014-12-29 (×5): 6 via TOPICAL

## 2014-12-24 MED ORDER — CEFEPIME HCL 1 G IJ SOLR
INTRAMUSCULAR | Status: AC
  Filled 2014-12-24 (×2): qty 1

## 2014-12-24 MED ORDER — VANCOMYCIN HCL IN DEXTROSE 1-5 GM/200ML-% IV SOLN
1000.0000 mg | Freq: Once | INTRAVENOUS | Status: AC
  Administered 2014-12-24: 1000 mg via INTRAVENOUS
  Filled 2014-12-24: qty 200

## 2014-12-24 MED ORDER — ENOXAPARIN SODIUM 40 MG/0.4ML ~~LOC~~ SOLN
40.0000 mg | SUBCUTANEOUS | Status: DC
  Administered 2014-12-24 – 2015-01-09 (×17): 40 mg via SUBCUTANEOUS
  Filled 2014-12-24 (×17): qty 0.4

## 2014-12-24 MED ORDER — DEXTROSE 5 % IV SOLN
1.0000 g | Freq: Three times a day (TID) | INTRAVENOUS | Status: DC
  Administered 2014-12-24 – 2014-12-30 (×17): 1 g via INTRAVENOUS
  Filled 2014-12-24 (×18): qty 1

## 2014-12-24 MED ORDER — STERILE WATER FOR INJECTION IJ SOLN
INTRAMUSCULAR | Status: AC
  Administered 2014-12-24: 10 mL
  Filled 2014-12-24: qty 10

## 2014-12-24 MED ORDER — ETOMIDATE 2 MG/ML IV SOLN
10.0000 mg | Freq: Once | INTRAVENOUS | Status: AC
  Administered 2014-12-24 (×2): 10 mg via INTRAVENOUS

## 2014-12-24 MED ORDER — SODIUM CHLORIDE 0.9 % IV SOLN
Freq: Once | INTRAVENOUS | Status: AC
  Administered 2014-12-24: 21:00:00 via INTRAVENOUS

## 2014-12-24 MED ORDER — ETOMIDATE 2 MG/ML IV SOLN
INTRAVENOUS | Status: AC
  Administered 2014-12-24: 10 mg via INTRAVENOUS
  Filled 2014-12-24: qty 20

## 2014-12-24 MED ORDER — CETYLPYRIDINIUM CHLORIDE 0.05 % MT LIQD
7.0000 mL | Freq: Four times a day (QID) | OROMUCOSAL | Status: DC
  Administered 2014-12-25 – 2015-01-01 (×30): 7 mL via OROMUCOSAL

## 2014-12-24 MED ORDER — ONDANSETRON HCL 4 MG/2ML IJ SOLN: 4.0000 mg | Freq: Four times a day (QID) | INTRAMUSCULAR | Status: DC | PRN

## 2014-12-24 NOTE — ED Notes (Signed)
Pt is being treated for UTI. Per EMS, Per staff at [facility] pt is lethargic today. Pt was given tylenol at 1400 prior to arrival

## 2014-12-24 NOTE — ED Provider Notes (Addendum)
CSN: [id number]     Arrival date & time 12/24/14  1448 History  This chart was scribed for Tracy Furry, MD by Edison Simon, ED Scribe. This patient was seen in room APA06/APA06 and the patient's care was started at 3:07 PM.    LEVEL Onset  Chief Complaint  Patient presents with  . Fever   The history is provided by the patient. The history is limited by the condition of the patient. No language interpreter was used.    HPI Comments: [name] is a 74 y.o. female who lives at long term care facility who presents to the Emergency Department complaining of fever measured up to 101 and decreased responsiveness with onset today. She is being treated with UTI and is on Rocephin daily since 3 days ago. Nobody accompanies patient, no additional details at this time.  Past Medical History  Diagnosis Date  . Cerebral palsy   . Asthma   . Seasonal allergies   . Thyroid disease   . HTN (hypertension)   . Anxiety   . Arthritis   . Breast cancer     Right breast, infiltrating ductal.  . Anginal pain   . Osteoporosis 05/08/2012  . Osteoporosis 05/08/2012  . Stroke   . Angina at rest   . Chronic steroid use   . Pneumonia 09/2013  . Dysphagia   . Hypopotassemia   . Respiratory failure   . Generalized weakness   . Urinary retention   . Dysphagia   . Acute MI   . Hypopotassemia   . Sepsis   . Neurogenic bladder   . Reflux   . Obesity    Past Surgical History  Procedure Laterality Date  . Breast lumpectomy    . Radical abdominal hysterectomy    . Dental extraction    . Right hand surgery     Family History  Problem Relation Age of Onset  . Cancer    . Diabetes    . Arthritis    . Asthma    . Heart attack Father   . Heart attack Mother    History  Substance Use Topics  . Smoking status: Never Smoker   . Smokeless tobacco: Never Used  . Alcohol Use: No   OB History    No data available     Review of Systems  Unable to perform ROS: Mental  status change  Constitutional: Positive for fever.  Psychiatric/Behavioral:       Altered mental status      Allergies  Latex and Naproxen  Home Medications   Prior to Admission medications   Medication Sig Start Date End Date Taking? Authorizing Provider  acetaminophen (TYLENOL) 325 MG tablet Take 2 tablets (650 mg total) by mouth every 6 (six) hours as needed for mild pain (or Fever >/= 101). 01/04/14   Alonza Bogus, MD  albuterol (PROVENTIL HFA;VENTOLIN HFA) 108 (90 BASE) MCG/ACT inhaler Inhale 2 puffs into the lungs every 4 (four) hours as needed. Shortness of breath 06/13/13   Alonza Bogus, MD  albuterol (PROVENTIL) (2.5 MG/3ML) 0.083% nebulizer solution Take 2.5 mg by nebulization 4 (four) times daily.    Historical Provider, MD  ALPRAZolam [name]) 1 MG tablet Take 1 mg by mouth 4 (four) times daily.     Historical Provider, MD  Amino Acids-Protein Hydrolys (FEEDING SUPPLEMENT, PRO-STAT SUGAR FREE 64,) LIQD Take 30 mLs by mouth 2 (two) times daily with a meal.    Historical Provider, MD  aspirin 325 MG tablet Take 1 tablet (325 mg total) by mouth daily. 06/13/13   Alonza Bogus, MD  atorvastatin (LIPITOR) 40 MG tablet Take 40 mg by mouth daily.    Historical Provider, MD  baclofen (LIORESAL) 10 MG tablet Take 5 mg by mouth 3 (three) times daily.    Historical Provider, MD  cholecalciferol (VITAMIN D) 1000 UNITS tablet Take 1,000 Units by mouth daily.    Historical Provider, MD  clopidogrel (PLAVIX) 75 MG tablet Take 1 tablet (75 mg total) by mouth daily with breakfast. 01/04/14   Alonza Bogus, MD  Cranberry 450 MG CAPS Take 1 capsule by mouth daily.    Historical Provider, MD  furosemide (LASIX) 20 MG tablet Take 20 mg by mouth daily.    Historical Provider, MD  guaiFENesin (MUCINEX) 600 MG 12 hr tablet Take 600 mg by mouth 2 (two) times daily.    Historical Provider, MD  haloperidol (HALDOL) 0.5 MG tablet Take 0.5 mg by mouth daily.    Historical Provider, MD   levothyroxine (SYNTHROID, LEVOTHROID) 88 MCG tablet Take 1 tablet (88 mcg total) by mouth daily before breakfast. 06/13/13   Alonza Bogus, MD  loperamide (IMODIUM) 2 MG capsule Take 1 capsule (2 mg total) by mouth as needed for diarrhea or loose stools. 06/17/14   Alonza Bogus, MD  loratadine (CLARITIN) 10 MG tablet Take 10 mg by mouth daily.    Historical Provider, MD  nitroGLYCERIN (NITROSTAT) 0.4 MG SL tablet Place 1 tablet (0.4 mg total) under the tongue every 5 (five) minutes as needed for chest pain. 06/13/13   Alonza Bogus, MD  pantoprazole (PROTONIX) 40 MG tablet Take 1 tablet (40 mg total) by mouth daily. 06/13/13   Alonza Bogus, MD  polyethylene glycol [facility] / Floria Raveling) packet Take 17 g by mouth daily.    Historical Provider, MD  potassium chloride SA (K-DUR,KLOR-CON) 20 MEQ tablet Take 10 mEq by mouth daily.  01/04/14   Alonza Bogus, MD  predniSONE (DELTASONE) 5 MG tablet Take 5 mg by mouth daily with breakfast.    Historical Provider, MD  ranolazine (RANEXA) 500 MG 12 hr tablet Take 1 tablet (500 mg total) by mouth 2 (two) times daily. 11/28/14   Herminio Commons, MD  saccharomyces boulardii (FLORASTOR) 250 MG capsule Take 250 mg by mouth 2 (two) times daily.    Historical Provider, MD  sertraline (ZOLOFT) 100 MG tablet Take 1 tablet (100 mg total) by mouth daily. 08/13/13   Alonza Bogus, MD  temazepam (RESTORIL) 7.5 MG capsule Take 7.5 mg by mouth at bedtime as needed for sleep.    Historical Provider, MD  traMADol (ULTRAM) 50 MG tablet Take 50 mg by mouth every 6 (six) hours as needed for moderate pain.    Historical Provider, MD  trolamine salicylate (ASPERCREME) 10 % cream Apply topically as needed for muscle pain. 06/17/14   Alonza Bogus, MD   BP 119/77 mmHg  Pulse 105  Temp(Src) 100.7 F (38.2 C) (Rectal)  Resp 25  Wt 211 lb (95.709 kg)  SpO2 92% Physical Exam  Constitutional: No distress.  Non-verbal Eyes closed Not markedly dyspneic Not pale   HENT:  Head: Normocephalic.  Moist mucosa   Eyes: Conjunctivae are normal. Pupils are equal, round, and reactive to light. No scleral icterus.  Neck: Normal range of motion. Neck supple. No thyromegaly present.  Cardiovascular: Regular rhythm.  Exam reveals no gallop and no friction rub.  No murmur heard. Tachycardic but regular  Pulmonary/Chest: Effort normal. No respiratory distress. She has no wheezes. She has no rales.  Breath sounds diminished in bases  Abdominal: Soft. Bowel sounds are normal. She exhibits no distension. There is no tenderness. There is no rebound.  Musculoskeletal: Normal range of motion.  Neurological:  delirious, moves all 4 extremities   Skin: Skin is warm and dry. No rash noted.  Nursing note and vitals reviewed.   ED Course  CRITICAL CARE Performed by: Ellison Leisure, [place] by: Harper Smoker, [place] care time (minutes): 30. Critical care start time: 12/24/2014 7:28 PM Critical care end time: 12/24/2014 7:58 PM Critical care time was exclusive of separately billable procedures and treating other patients. Critical care was necessary to treat or prevent imminent or life-threatening deterioration of the following conditions: respiratory failure. Critical care was time spent personally by me on the following activities: blood draw for specimens, development of treatment plan with patient or surrogate, discussions with consultants, interpretation of cardiac output measurements, evaluation of patient's response to treatment, examination of patient, obtaining history from patient or surrogate, ordering and performing treatments and interventions, ordering and review of laboratory studies, ordering and review of radiographic studies, pulse oximetry, re-evaluation of patient's condition, review of old charts and ventilator management.  INTUBATION Date/Time: 12/24/2014 7:59 PM Performed by: [name] Authorized by: [name] Consent: Verbal consent not obtained.  Written consent not obtained. Risks and benefits: risks, benefits and alternatives were discussed Consent given by: Son. Patient understanding: patient does not state understanding of the procedure being performed Patient identity confirmed: arm band Time out: Immediately prior to procedure a "time out" was called to verify the correct patient, procedure, equipment, support staff and site/side marked as required. Indications: hypercapnia and  respiratory failure Intubation method: video-assisted Patient status: paralyzed (RSI) Preoxygenation: nonrebreather mask Sedatives: etomidate Paralytic: succinylcholine Laryngoscope size: Miller 3 Tube size: 7.5 mm Tube type: cuffed Number of attempts: 1 Cricoid pressure: yes Cords visualized: yes Post-procedure assessment: chest rise and ETCO2 monitor Breath sounds: equal Cuff inflated: yes ETT to lip: 21 cm Tube secured with: ETT holder Chest x-ray interpreted by me. Chest x-ray findings: endotracheal tube in appropriate position Patient tolerance: Patient tolerated the procedure well with no immediate complications   (including critical care time)  CENTRAL LINE Performed by: Lolita Patella Consent: The procedure was performed in an emergent situation. Required items: required blood products, implants, devices, and special equipment available Patient identity confirmed: arm band and provided demographic data Time out: Immediately prior to procedure a "time out" was called to verify the correct patient, procedure, equipment, support staff and site/side marked as required. Indications: vascular access Anesthesia: local infiltration Local anesthetic: lidocaine 1% with epinephrine Anesthetic total: 3 ml Patient sedated: no Preparation: skin prepped with 2% chlorhexidine Skin prep agent dried: skin prep agent completely dried prior to procedure Sterile barriers: all five maximum sterile barriers used - cap, mask, sterile gown, sterile  gloves, and large sterile sheet Hand hygiene: hand hygiene performed prior to central venous catheter insertion  Location details: Rt IJ  Catheter type: triple lumen Catheter size: 8 Fr Pre-procedure: landmarks identified Ultrasound guidance: Yes Successful placement: yes Post-procedure: line sutured and dressing applied Assessment: blood return through all parts, free fluid flow, placement verified by x-ray and no pneumothorax on x-ray Patient tolerance: Patient tolerated the procedure well with no immediate complications.  Post x-ray shows tip to be into the SVC and then left lateral. After re-prep the catheter was  withdrawn the wire replaced in the catheter readvanced. Second chest x-ray shows tip of the catheter in proper position the SVC.   DIAGNOSTIC STUDIES: Oxygen Saturation is 92% on nasal canula, adequate by my interpretation.    COORDINATION OF CARE: 3:14 PM Patient non-verbal and nobody accompanying at this time.   Labs Review Labs Reviewed - No data to display  Imaging Review No results found.   EKG Interpretation None      MDM   Final diagnoses:  None    Patient with persistence of findings in her urine suggestive of UTI despite 3 days of IM Rocephin. Hypoxemic. Chest x-ray suggestive of bibasilar infiltrates. Possibly CHF.    Echocardiogram 3/15 shows ejection fraction of 65%.  CT noncontrast obtained and shows no obvious infiltrate and probable interstitial fluid. However, BNP only 136. Febrile initially 100.7. Cultures are obtained. Given Maxipime, and vancomycin.   Has not diuresed the patient as her blood pressures of been continuous. Given fluids initially with initial diagnosis of UTI versus pneumonia versus sepsis. Given total of 1 L. Continues to require oxygen by mask. Does not show worsening dyspnea.  ABG obtained. Has the elevated PCO2. Care discussed with Dr. Ezzie Dural. Admission to ICU following intubation.  Patient intubated and  right IJ catheter placed without difficulty. Continues to oxygenate well.   I personally performed the services described in this documentation, which was scribed in my presence. The recorded information has been reviewed and is accurate.    Tracy Furry, MD 12/24/14 1817  Tracy Furry, MD 12/24/14 2001

## 2014-12-24 NOTE — ED Notes (Signed)
CRITICAL VALUE ALERT  Critical value received:  CO2 - >50  Date of notification:  12/24/2014  Time of notification:  1637  Critical value read back: yes  Nurse who received alert:  LJS  MD notified (1st page):  Dr Jeneen Rinks  Time of first page:  1637  MD notified (2nd page):  Time of second page Responding MD:  Dr Jeneen Rinks  Time MD responded:  [phone]

## 2014-12-24 NOTE — Progress Notes (Addendum)
ANTIBIOTIC CONSULT NOTE - FOLLOW UP  Pharmacy Consult for Vancomycin & Cefepime Indication: urosepsis  Allergies  Allergen Reactions  . Latex     Provided via MAR  . Naproxen Other (See Comments)    Unknown-Provided via MAR    Patient Measurements: Weight: 211 lb (95.709 kg)  Vital Signs: Temp: 100.7 F (38.2 C) (01/05 1458) Temp Source: Rectal (01/05 1458) BP: 120/85 mmHg (01/05 1937) Pulse Rate: 87 (01/05 1937) Intake/Output from previous day:   Intake/Output from this shift:    Labs:  Recent Labs  12/24/14 1530  WBC 5.4  HGB 11.9*  PLT 141*  CREATININE 0.51   Estimated Creatinine Clearance: 67.5 mL/min (by C-G formula based on Cr of 0.51). No results for input(s): VANCOTROUGH, VANCOPEAK, VANCORANDOM, GENTTROUGH, GENTPEAK, GENTRANDOM, TOBRATROUGH, TOBRAPEAK, TOBRARND, AMIKACINPEAK, AMIKACINTROU, AMIKACIN in the last 72 hours.   Microbiology: No results found for this or any previous visit (from the past 720 hour(s)).  Anti-infectives    Start     Dose/Rate Route Frequency Ordered Stop   12/24/14 1615  vancomycin (VANCOCIN) IVPB 1000 mg/200 mL premix     1,000 mg200 mL/hr over 60 Minutes Intravenous  Once 12/24/14 1606 12/24/14 1750   12/24/14 1530  ceFEPIme (MAXIPIME) 2 g in dextrose 5 % 50 mL IVPB     2 g100 mL/hr over 30 Minutes Intravenous  Once 12/24/14 1516 12/24/14 1635      Assessment: 74 yo F with hx cerebral palsy who is wheelchair bound & resides at SNF.  She received IM dose of Rocephin last night for UTI, however continued to decline today.  She was brought to ED with lethargy & fever.  She was intubated in ED.   She is currently afebrile with normal WBC and lactic acid level.  CXR did not reveal PNA. She has hx of Acinetobacter in urine that was resistant to cephalosporins (05/2014).  Given decline on Rocephin, consider change Cefepime to Zosyn until cx data finalized.    Vancomycin 1/5>> Cefepime 1/5>>  Goal of Therapy:  Vancomycin trough  level 15-20 mcg/ml  Plan:  Cefepime 1gm IV q8h Vancomycin 1250mg  IV q12h Check Vancomycin trough at steady state Monitor renal function and cx data  Duration of therapy per MD  Biagio Borg 12/24/2014,7:55 PM

## 2014-12-24 NOTE — ED Notes (Signed)
RN at bedside since arrival to ED. Patient lethargic, arouses to loud verbal stimuli and painful stimuli. EMS called to [facility] for decreased LOC. Patient febrile with noted sinus tachycardia, tachypnea. See prior note on oxygen saturations. Patient was unable to maintain oxygen saturations >90% on 6 liter , dropping to 84%, patient placed on venturi mask at 35% FiO2 without change. Patient gradually increased to 55% FiO2 via venturi mask and oxygen saturations increased to 92%. Patient currently maintaining oxygen saturations at >92% for last 15 minutes. 2 IVs established, labs obtained. Will continue to monitor patient closely.

## 2014-12-24 NOTE — H&P (Signed)
[name] History and Physical  [name] DJS:[id number] DOB: [birth year] DOA: 12/24/2014  Referring physician: ER PCP: Alonza Bogus, MD   Chief Complaint: Altered mental status, hypoxemia.  HPI: [name] is a 74 y.o. female  This is a 74 year old lady who has a history of cerebral palsy and is usually wheelchair or bedbound who presents with fever measured at 101 and decreased responsiveness that started today. The patient herself cannot give any clear history whatsoever at this point in time. She apparently was treated for UTI with Rocephin for the last 3 days. She appears to have gotten worse. Evaluation in the emergency room shows it to be hypotensive, temperature of 100.7 and obtunded. She is requiring facemask with 55% oxygen to maintain saturations above 90%. Urinalysis is abnormal with a suggestion of UTI. Chest x-ray is reported as showing congestive heart failure. However she does not appear to have clinically heart failure. She is now being admitted for further management.   Review of Systems:  Unable to obtain review of systems because of her altered mental state.  Past Medical History  Diagnosis Date  . Cerebral palsy   . Asthma   . Seasonal allergies   . Thyroid disease   . HTN (hypertension)   . Anxiety   . Arthritis   . Breast cancer     Right breast, infiltrating ductal.  . Anginal pain   . Osteoporosis 05/08/2012  . Osteoporosis 05/08/2012  . Stroke   . Angina at rest   . Chronic steroid use   . Pneumonia 09/2013  . Dysphagia   . Hypopotassemia   . Respiratory failure   . Generalized weakness   . Urinary retention   . Dysphagia   . Acute MI   . Hypopotassemia   . Sepsis   . Neurogenic bladder   . Reflux   . Obesity    Past Surgical History  Procedure Laterality Date  . Breast lumpectomy    . Radical abdominal hysterectomy    . Dental extraction    . Right hand surgery     Social History:  reports that she has never smoked.  She has never used smokeless tobacco. She reports that she does not drink alcohol or use illicit drugs.  Allergies  Allergen Reactions  . Latex     Provided via MAR  . Naproxen Other (See Comments)    Unknown-Provided via MAR    Family History  Problem Relation Age of Onset  . Cancer    . Diabetes    . Arthritis    . Asthma    . Heart attack Father   . Heart attack Mother      Prior to Admission medications   Medication Sig Start Date End Date Taking? Authorizing Provider  acetaminophen (TYLENOL) 325 MG tablet Take 2 tablets (650 mg total) by mouth every 6 (six) hours as needed for mild pain (or Fever >/= 101). 01/04/14  Yes Alonza Bogus, MD  albuterol (PROVENTIL HFA;VENTOLIN HFA) 108 (90 BASE) MCG/ACT inhaler Inhale 2 puffs into the lungs every 4 (four) hours as needed. Shortness of breath 06/13/13  Yes Alonza Bogus, MD  albuterol (PROVENTIL) (2.5 MG/3ML) 0.083% nebulizer solution Take 2.5 mg by nebulization 4 (four) times daily.   Yes Historical Provider, MD  ALPRAZolam [name]) 1 MG tablet Take 1 mg by mouth 4 (four) times daily.    Yes Historical Provider, MD  Amino Acids-Protein Hydrolys (FEEDING SUPPLEMENT, PRO-STAT SUGAR FREE 64,) LIQD Take 30  mLs by mouth 2 (two) times daily with a meal.   Yes Historical Provider, MD  aspirin 325 MG tablet Take 1 tablet (325 mg total) by mouth daily. 06/13/13  Yes Alonza Bogus, MD  atorvastatin (LIPITOR) 40 MG tablet Take 40 mg by mouth daily.   Yes Historical Provider, MD  baclofen (LIORESAL) 10 MG tablet Take 5 mg by mouth 3 (three) times daily.   Yes Historical Provider, MD  cefTRIAXone (ROCEPHIN) 1 G injection Inject 1 g into the muscle once. Last administered on 12/23/2014 at 22:00 IM to right hip   Yes Historical Provider, MD  cholecalciferol (VITAMIN D) 1000 UNITS tablet Take 1,000 Units by mouth daily.   Yes Historical Provider, MD  clopidogrel (PLAVIX) 75 MG tablet Take 1 tablet (75 mg total) by mouth daily with breakfast.  01/04/14  Yes Alonza Bogus, MD  Cranberry 450 MG CAPS Take 1 capsule by mouth daily.   Yes Historical Provider, MD  guaiFENesin (MUCINEX) 600 MG 12 hr tablet Take 600 mg by mouth 2 (two) times daily.   Yes Historical Provider, MD  haloperidol (HALDOL) 0.5 MG tablet Take 0.5 mg by mouth 2 (two) times daily.    Yes Historical Provider, MD  levothyroxine (SYNTHROID, LEVOTHROID) 88 MCG tablet Take 1 tablet (88 mcg total) by mouth daily before breakfast. 06/13/13  Yes Alonza Bogus, MD  loperamide (IMODIUM) 2 MG capsule Take 1 capsule (2 mg total) by mouth as needed for diarrhea or loose stools. 06/17/14  Yes Alonza Bogus, MD  loratadine (CLARITIN) 10 MG tablet Take 10 mg by mouth daily.   Yes Historical Provider, MD  nitroGLYCERIN (NITROSTAT) 0.4 MG SL tablet Place 1 tablet (0.4 mg total) under the tongue every 5 (five) minutes as needed for chest pain. 06/13/13  Yes Alonza Bogus, MD  pantoprazole (PROTONIX) 40 MG tablet Take 1 tablet (40 mg total) by mouth daily. 06/13/13  Yes Alonza Bogus, MD  polyethylene glycol [facility] / GLYCOLAX) packet Take 17 g by mouth daily.   Yes Historical Provider, MD  potassium chloride SA (K-DUR,KLOR-CON) 20 MEQ tablet Take 10 mEq by mouth daily.  01/04/14  Yes Alonza Bogus, MD  predniSONE (DELTASONE) 5 MG tablet Take 5 mg by mouth daily with breakfast.   Yes Historical Provider, MD  ranolazine (RANEXA) 500 MG 12 hr tablet Take 1 tablet (500 mg total) by mouth 2 (two) times daily. 11/28/14  Yes Herminio Commons, MD  saccharomyces boulardii (FLORASTOR) 250 MG capsule Take 250 mg by mouth 2 (two) times daily.   Yes Historical Provider, MD  sertraline (ZOLOFT) 100 MG tablet Take 1 tablet (100 mg total) by mouth daily. 08/13/13  Yes Alonza Bogus, MD  temazepam (RESTORIL) 7.5 MG capsule Take 7.5 mg by mouth at bedtime as needed for sleep.   Yes Historical Provider, MD  traMADol (ULTRAM) 50 MG tablet Take 50 mg by mouth every 6 (six) hours as needed for  moderate pain.   Yes Historical Provider, MD  furosemide (LASIX) 20 MG tablet Take 20 mg by mouth daily.    Historical Provider, MD  trolamine salicylate (ASPERCREME) 10 % cream Apply topically as needed for muscle pain. Patient not taking: Reported on 12/24/2014 06/17/14   Alonza Bogus, MD   Physical Exam: Filed Vitals:   12/24/14 1730 12/24/14 1745 12/24/14 1800 12/24/14 1815  BP: 100/56 104/54 106/60 101/59  Pulse: 94 95 94 95  Temp:      TempSrc:  Resp: 24 19 22 24   Weight:      SpO2: 92% 92% 90% 89%    Wt Readings from Last 3 Encounters:  12/24/14 95.709 kg (211 lb)  11/12/14 101.606 kg (224 lb)  06/15/14 101.8 kg (224 lb 6.9 oz)    General:  Appears obtunded. Does not respond to voice or painful stimuli. She is clinically dehydrated. She is hypotensive. She feels warm. She looks septic clinically. Eyes: PERRL, normal lids, irises & conjunctiva ENT: grossly normal hearing, lips & tongue Neck: no LAD, masses or thyromegaly Cardiovascular: RRR, no m/r/g. No LE edema. Telemetry: SR, no arrhythmias  Respiratory: Scattered occasional crackles. No bronchial breathing. Abdomen: soft, ntnd Skin: no rash or induration seen on limited exam Musculoskeletal: grossly normal tone BUE/BLE Psychiatric: Not examined due to altered mental status. Neurologic: No clear focalizing signs.           Labs on Admission:  Basic Metabolic Panel:  Recent Labs Lab 12/24/14 1530  NA 153*  K 3.5  CL 91*  CO2 >50*  GLUCOSE 117*  BUN 23  CREATININE 0.51  CALCIUM 8.8   Liver Function Tests: No results for input(s): AST, ALT, ALKPHOS, BILITOT, PROT, ALBUMIN in the last 168 hours. No results for input(s): LIPASE, AMYLASE in the last 168 hours. No results for input(s): AMMONIA in the last 168 hours. CBC:  Recent Labs Lab 12/24/14 1530  WBC 5.4  NEUTROABS 3.9  HGB 11.9*  HCT 45.8  MCV 103.9*  PLT 141*   Cardiac Enzymes:  Recent Labs Lab 12/24/14 1530  TROPONINI 0.03     BNP (last 3 results)  Recent Labs  12/31/13 0322 06/13/14 2007 11/12/14 2100  PROBNP 408.7* 172.5* 434.4*   CBG: No results for input(s): GLUCAP in the last 168 hours.  Radiological Exams on Admission: Ct Chest Wo Contrast  12/24/2014   CLINICAL DATA:  Fever, hypoxia  EXAM: CT CHEST WITHOUT CONTRAST  TECHNIQUE: Multidetector CT imaging of the chest was performed following the standard protocol without IV contrast.  COMPARISON:  Plain film from earlier in the same day  FINDINGS: The lungs are with well aerated bilaterally with evidence of vascular congestion and some patchy ground-glass changes likely related to parenchymal edema. Mild lower lobe atelectatic changes are seen. No sizable effusion is noted. The hilar and mediastinal structures show no significant lymphadenopathy. The pulmonary artery is mildly prominent consistent with the vascular congestion.  Scanning into the upper abdomen reveals no acute abnormality. Bony structures show degenerative change of the thoracic spine. Changes consistent with a vertebral hemangioma are noted at T8. At the inferior aspect of L1 on the last image there is a lucency within the L1 vertebral body. This is stable from a prior exam from 03/13/2014 and likely represent a second hemangioma.  IMPRESSION: Vertebral hemangiomas.  Mild vascular congestion and bibasilar atelectatic changes similar to that seen on the prior plain film examination.   Electronically Signed   By: Inez Catalina M.D.   On: 12/24/2014 18:06   Dg Chest Port 1 View  12/24/2014   CLINICAL DATA:  Shortness of breath and poor oxygenation.  EXAM: PORTABLE CHEST - 1 VIEW  COMPARISON:  11/12/2014  FINDINGS: The heart is enlarged but stable. There is central vascular congestion and pulmonary edema with small bilateral pleural effusions consistent with CHF.  IMPRESSION: CHF.   Electronically Signed   By: Kalman Jewels M.D.   On: 12/24/2014 15:48      Assessment/Plan   1. Sepsis likely  secondary to UTI, partially treated. She is hypotensive, febrile. Treat with broad-spectrum antibiotics. 2. Hypotension. This is secondary to sepsis and dehydration/hypovolemia. Treat with aggressive intravenous fluids and she may need pressors. Admit to intensive care unit. 3. Hypoxemia/respiratory failure. She does have a history of chronic respiratory failure we will obtain blood gas. Depending on result of this and her clinical progress, she may need intubation and mechanical ventilation. 4. Cerebral palsy with wheelchair-bound/bedbound state. 5. Hypertension, currently hypotensive.  Further recommendations will depend on patient's hospital progress.   Code Status: Full code. I discussed CODE STATUS with patient's son at the bedside and he had indicated that previously she would want resuscitation.  DVT Prophylaxis: Lovenox  Family Communication: I discussed the plan with the patient's son at the bedside.   Disposition Plan: Depending on progress.  Time spent: 60 minutes.  Doree Albee [name] Pager [phone].

## 2014-12-24 NOTE — ED Notes (Signed)
Pt intubated with 7.5 ett, positive co2 color change, secured 22 cm at lip, bilat breath sounds auscultated by dr Jeneen Rinks.,

## 2014-12-24 NOTE — ED Notes (Signed)
Critical ABG results: CO2 and pH reported to Dr Jeneen Rinks and Dr Anastasio Champion. Patient moved to room 2 and prepped for intubation.

## 2014-12-24 NOTE — Progress Notes (Signed)
eLink Physician-Brief Progress Note Patient Name: [name] DOB: 23-May-1941 MRN: [id number]   Date of Service  12/24/2014  HPI/Events of Note  74 year old lady who has a history of cerebral palsy and is usually wheelchair or bedbound who presents with fever measured at 101 and decreased responsiveness that started today, recent UTI, and now with respiratory failure (hypoxic\hypercarbic)  eICU Interventions  Recheck ABG Keep vt~500 Monitor of overventilation (ph~7.5) GI\DVT Prophylaxis Cont with Antibiotics     Intervention Category Evaluation Type: New Patient Evaluation  Marciano Mundt 12/24/2014, 9:38 PM

## 2014-12-24 NOTE — ED Notes (Signed)
Patient wears 2 liters via Nasal cannula at all times. Oxygen saturations 70% on [place] at 2 liters. Patient did not significantly improve on 4 liters (78%). Patient did improve to 92% on 6 liters

## 2014-12-24 NOTE — ED Notes (Signed)
Supplies for [employer] insertion set up at bedside. Sister in room with patient and updated on plan of care by MD. Patient remains lethargic, tachypnic and tachycardic.

## 2014-12-25 ENCOUNTER — Encounter (HOSPITAL_COMMUNITY): Payer: Medicare Other

## 2014-12-25 ENCOUNTER — Inpatient Hospital Stay (HOSPITAL_COMMUNITY): Payer: Medicare Other

## 2014-12-25 LAB — COMPREHENSIVE METABOLIC PANEL
ALT: 8 U/L (ref 0–35)
AST: 14 U/L (ref 0–37)
Albumin: 2.7 g/dL — ABNORMAL LOW (ref 3.5–5.2)
Alkaline Phosphatase: 44 U/L (ref 39–117)
Anion gap: 5 (ref 5–15)
BILIRUBIN TOTAL: 0.8 mg/dL (ref 0.3–1.2)
BUN: 21 mg/dL (ref 6–23)
CHLORIDE: 97 meq/L (ref 96–112)
CO2: 44 mmol/L — AB (ref 19–32)
Calcium: 8.2 mg/dL — ABNORMAL LOW (ref 8.4–10.5)
Creatinine, Ser: 0.41 mg/dL — ABNORMAL LOW (ref 0.50–1.10)
GFR calc non Af Amer: >90 mL/min (ref >90)
Glucose, Bld: 110 mg/dL — ABNORMAL HIGH (ref 70–99)
Potassium: 2.8 mmol/L — ABNORMAL LOW (ref 3.5–5.1)
SODIUM: 146 mmol/L — AB (ref 135–145)
Total Protein: 4.9 g/dL — ABNORMAL LOW (ref 6.0–8.3)

## 2014-12-25 LAB — BLOOD GAS, ARTERIAL
ACID-BASE DEFICIT: 20.5 mmol/L — AB (ref 0.0–2.0)
Acid-Base Excess: 20.7 mmol/L — ABNORMAL HIGH (ref 0.0–2.0)
Acid-Base Excess: 19.1 mmol/L — ABNORMAL HIGH (ref 0.0–2.0)
Acid-Base Excess: 17.1 mmol/L — ABNORMAL HIGH (ref 0.0–2.0)
Bicarbonate: 44.5 mEq/L — ABNORMAL HIGH (ref 20.0–24.0)
Bicarbonate: 43.3 mEq/L — ABNORMAL HIGH (ref 20.0–24.0)
Bicarbonate: 41.5 mEq/L — ABNORMAL HIGH (ref 20.0–24.0)
Drawn by: 213101
Drawn by: 22275
FIO2: 0.4 %
FIO2: 0.5 %
FIO2: 50 %
LHR: 18 {breaths}/min
O2 Content: 50 L/min
O2 SAT: 97 %
O2 Saturation: 98.5 %
O2 Saturation: 98.7 %
PATIENT TEMPERATURE: 37
PCO2 ART: 37.8 mmHg (ref 35.0–45.0)
PEEP/CPAP: 5 cmH2O
PEEP/CPAP: 5 cmH2O
PEEP: 5 cmH2O
PH ART: 7.688 — AB (ref 7.350–7.450)
PH ART: 7.525 — AB (ref 7.350–7.450)
Patient temperature: 100.2
Patient temperature: 37
RATE: 12 resp/min
RATE: 10 resp/min
TCO2: 38.8 mmol/L (ref 0–100)
TCO2: 38.5 mmol/L (ref 0–100)
TCO2: 36.9 mmol/L (ref 0–100)
VT: 500 mL
VT: 500 mL
VT: 500 mL
pCO2 arterial: 45.1 mmHg — ABNORMAL HIGH (ref 35.0–45.0)
pCO2 arterial: 50.5 mmHg — ABNORMAL HIGH (ref 35.0–45.0)
pH, Arterial: 7.588 — ABNORMAL HIGH (ref 7.350–7.450)
pO2, Arterial: 65.9 mmHg — ABNORMAL LOW (ref 80.0–100.0)
pO2, Arterial: 90.9 mmHg (ref 80.0–100.0)
pO2, Arterial: 110 mmHg — ABNORMAL HIGH (ref 80.0–100.0)

## 2014-12-25 LAB — GLUCOSE, CAPILLARY
GLUCOSE-CAPILLARY: 124 mg/dL — AB (ref 70–99)
Glucose-Capillary: 110 mg/dL — ABNORMAL HIGH (ref 70–99)
Glucose-Capillary: 110 mg/dL — ABNORMAL HIGH (ref 70–99)
Glucose-Capillary: 148 mg/dL — ABNORMAL HIGH (ref 70–99)
Glucose-Capillary: 127 mg/dL — ABNORMAL HIGH (ref 70–99)

## 2014-12-25 LAB — POTASSIUM: POTASSIUM: 3.8 mmol/L (ref 3.5–5.1)

## 2014-12-25 LAB — CBC
HCT: 38.1 % (ref 36.0–46.0)
Hemoglobin: 10.5 g/dL — ABNORMAL LOW (ref 12.0–15.0)
MCH: 27.3 pg (ref 26.0–34.0)
MCHC: 27.6 g/dL — AB (ref 30.0–36.0)
MCV: 99 fL (ref 78.0–100.0)
Platelets: 97 10*3/uL — ABNORMAL LOW (ref 150–400)
RBC: 3.85 MIL/uL — AB (ref 3.87–5.11)
RDW: 16.5 % — ABNORMAL HIGH (ref 11.5–15.5)
WBC: 5.3 10*3/uL (ref 4.0–10.5)

## 2014-12-25 LAB — TROPONIN I
Troponin I: 0.03 ng/mL (ref <0.031)
Troponin I: <0.03 ng/mL (ref <0.031)

## 2014-12-25 LAB — URINE CULTURE
Colony Count: NO GROWTH
Culture: NO GROWTH

## 2014-12-25 MED ORDER — PROPOFOL 10 MG/ML IV EMUL
INTRAVENOUS | Status: AC
  Administered 2014-12-25: 1000 mg
  Filled 2014-12-25: qty 100

## 2014-12-25 MED ORDER — ACETAMINOPHEN 650 MG RE SUPP: 650.0000 mg | RECTAL | Status: DC | PRN

## 2014-12-25 MED ORDER — PROPOFOL 10 MG/ML IV EMUL
5.0000 ug/kg/min | INTRAVENOUS | Status: DC
  Administered 2014-12-25: 55 ug/kg/min via INTRAVENOUS
  Administered 2014-12-25: 45 ug/kg/min via INTRAVENOUS
  Administered 2014-12-25: 60 ug/kg/min via INTRAVENOUS
  Administered 2014-12-25: 50 ug/kg/min via INTRAVENOUS
  Administered 2014-12-25 – 2014-12-26 (×2): 55 ug/kg/min via INTRAVENOUS
  Administered 2014-12-26: 45 ug/kg/min via INTRAVENOUS
  Administered 2014-12-26: 50 ug/kg/min via INTRAVENOUS
  Administered 2014-12-26: 55 ug/kg/min via INTRAVENOUS
  Administered 2014-12-26: 65 ug/kg/min via INTRAVENOUS
  Administered 2014-12-26: 60 ug/kg/min via INTRAVENOUS
  Administered 2014-12-27: 50 ug/kg/min via INTRAVENOUS
  Administered 2014-12-27 (×2): 60 ug/kg/min via INTRAVENOUS
  Administered 2014-12-27 (×2): 45 ug/kg/min via INTRAVENOUS
  Administered 2014-12-28: 47 ug/kg/min via INTRAVENOUS
  Administered 2014-12-28 (×2): 60 ug/kg/min via INTRAVENOUS
  Administered 2014-12-28 (×2): 70 ug/kg/min via INTRAVENOUS
  Administered 2014-12-28: 46 ug/kg/min via INTRAVENOUS
  Administered 2014-12-28: 47 ug/kg/min via INTRAVENOUS
  Administered 2014-12-28: 60 ug/kg/min via INTRAVENOUS
  Administered 2014-12-28 – 2014-12-29 (×6): 70 ug/kg/min via INTRAVENOUS
  Administered 2014-12-29: 69.925 ug/kg/min via INTRAVENOUS
  Administered 2014-12-29 – 2014-12-30 (×3): 70 ug/kg/min via INTRAVENOUS
  Administered 2014-12-30: 55 ug/kg/min via INTRAVENOUS
  Administered 2014-12-30 (×2): 70 ug/kg/min via INTRAVENOUS
  Administered 2014-12-30: 60 ug/kg/min via INTRAVENOUS
  Administered 2014-12-30: 70 ug/kg/min via INTRAVENOUS
  Administered 2014-12-31 (×2): 50 ug/kg/min via INTRAVENOUS
  Filled 2014-12-25: qty 100
  Filled 2014-12-25: qty 200
  Filled 2014-12-25 (×16): qty 100
  Filled 2014-12-25: qty 200
  Filled 2014-12-25 (×20): qty 100
  Filled 2014-12-25: qty 200
  Filled 2014-12-25: qty 100
  Filled 2014-12-25: qty 300
  Filled 2014-12-25: qty 100
  Filled 2014-12-25: qty 200

## 2014-12-25 MED ORDER — POTASSIUM CHLORIDE 10 MEQ/100ML IV SOLN
10.0000 meq | INTRAVENOUS | Status: AC
Start: 2014-12-25 — End: 2014-12-25
  Administered 2014-12-25 (×4): 10 meq via INTRAVENOUS
  Filled 2014-12-25 (×4): qty 100

## 2014-12-25 MED ORDER — SODIUM CHLORIDE 0.9 % IJ SOLN
10.0000 mL | Freq: Two times a day (BID) | INTRAMUSCULAR | Status: DC
  Administered 2014-12-25 – 2014-12-28 (×5): 10 mL
  Administered 2014-12-28 – 2014-12-30 (×3): 40 mL
  Administered 2014-12-30 – 2015-01-01 (×3): 10 mL
  Administered 2015-01-02: 30 mL
  Administered 2015-01-02 – 2015-01-10 (×16): 10 mL

## 2014-12-25 MED ORDER — INSULIN ASPART 100 UNIT/ML ~~LOC~~ SOLN
0.0000 [IU] | SUBCUTANEOUS | Status: DC
  Administered 2014-12-25 – 2014-12-29 (×8): 3 [IU] via SUBCUTANEOUS
  Administered 2014-12-29: 4 [IU] via SUBCUTANEOUS
  Administered 2014-12-29: 3 [IU] via SUBCUTANEOUS
  Administered 2014-12-29: 4 [IU] via SUBCUTANEOUS
  Administered 2014-12-30 – 2015-01-01 (×8): 3 [IU] via SUBCUTANEOUS
  Administered 2015-01-01: 4 [IU] via SUBCUTANEOUS
  Administered 2015-01-02 – 2015-01-07 (×12): 3 [IU] via SUBCUTANEOUS

## 2014-12-25 MED ORDER — SODIUM CHLORIDE 0.9 % IJ SOLN
10.0000 mL | INTRAMUSCULAR | Status: DC | PRN
  Administered 2014-12-29 – 2015-01-09 (×3): 10 mL
  Filled 2014-12-25 (×3): qty 40

## 2014-12-25 MED ORDER — METHYLPREDNISOLONE SODIUM SUCC 40 MG IJ SOLR
40.0000 mg | Freq: Four times a day (QID) | INTRAMUSCULAR | Status: DC
  Administered 2014-12-25 – 2015-01-01 (×28): 40 mg via INTRAVENOUS
  Filled 2014-12-25 (×27): qty 1

## 2014-12-25 NOTE — Progress Notes (Signed)
Lab called with a critical CO2 of 44 which is improved from the previous value. Dr. Luan Pulling in to see pt now. Continue monitoring.

## 2014-12-25 NOTE — Progress Notes (Signed)
PH 7.688 called by respiratory.  Pt currently on ventilator. RT to make adjustments  To vent settings. Will monitor closely

## 2014-12-25 NOTE — Progress Notes (Signed)
eLink Physician-Brief Progress Note Patient Name: SHERIAL EBRAHIM DOB: 1941/11/07 MRN: [id number]   Date of Service  12/25/2014  HPI/Events of Note  Severe resp alkalosis - baselne PCO2 prob 70's  eICU Interventions  Drop RR to 12     Intervention Category Major Interventions: Acid-Base disturbance - evaluation and management  Shin Lamour V. 12/25/2014, 4:44 AM

## 2014-12-25 NOTE — ED Notes (Signed)
CRITICAL VALUE ALERT  Critical value received:  Blood gas  Date of notification:  12/24/14  Time of notification:  2013  Critical value read back: yes  Nurse who received alert:  Cheri Rous, RN  MD notified (1st page):  Jeneen Rinks  Time of first page:  2013  MD notified (2nd page):  Time of second page:  Responding MD:  Jeneen Rinks  Time MD responded:  2013

## 2014-12-25 NOTE — Clinical Social Work Psychosocial (Signed)
Clinical Social Work Department BRIEF PSYCHOSOCIAL ASSESSMENT 12/25/2014  Patient:  JIMMI, SIDENER     Account Number:  [id number]     Admit date:  12/24/2014  Clinical Social Worker:  Legrand Como  Date/Time:  12/25/2014 10:50 AM  Referred by:  CSW  Date Referred:  12/25/2014 Referred for  SNF Placement   Other Referral:   Interview type:  Family Other interview type:   Sister, Everline Annett Gula, Debbie    PSYCHOSOCIAL DATA Living Status:  FACILITY Admitted from facility:  [facility] Level of care:  [place] Primary support name:  [name] Primary support relationship to patient:  SPOUSE Degree of support available:   Sister and son are very supportive.    CURRENT CONCERNS Current Concerns  Post-Acute Placement   Other Concerns:    SOCIAL WORK ASSESSMENT / PLAN CSW could not interview patient as she is on a ventilator. CSW spoke with patient's sister, [name]. Ms. Benjamine Mola indicated that patient has been as resident at [facility] since August, 2014.  She indicated that prior to that patient was at [facility] for one month, then she went to [facility] for rehab as she was not able to return to [facility] due to having a skilled need.  Ms. Benjamine Mola indicated that prior to going to [facility] patient lived at home alone and she had a caregiver/aid working with her during the day and that she and patient's son were staying the night.  Ms. Benjamine Mola indicated she visits patient on a regular basis and that patient's son visits about one to two times per week as he works out of town. She stated that patient uses a wheelchair.  Ms. Benjamine Mola indicated that the family would like for patient to return to [facility] upon discharge.  CSW spoke with Jackelyn Poling at [place] [facility].  Debbie confirmed patient's sister's statements.  She indicated that patient uses a wheelchair and is an extensive assist.  She stated that patient can come back to the facility upon discharge.   Assessment/plan  status:   Other assessment/ plan:   Information/referral to community resources:    PATIENT'S/FAMILY'S RESPONSE TO PLAN OF CARE: Family and facility are agreeable to patient returning to facility upon discharge.   Ambrose Pancoast, [place]

## 2014-12-25 NOTE — Plan of Care (Signed)
Problem: Phase I Progression Outcomes Goal: Initiate hyperglycemia protocol as indicated Outcome: Completed/Met Date Met:  12/25/14 q4 CBGs with sliding scale    Goal: Voiding-avoid urinary catheter unless indicated Outcome: Not Progressing Currently requires foley catheter

## 2014-12-25 NOTE — Care Management Note (Addendum)
    Page 1 of 1   01/10/2015     9:08:31 AM CARE MANAGEMENT NOTE 01/10/2015  Patient:  [name], [name]   Account Number:  [id number]  Date Initiated:  12/25/2014  Documentation initiated by:  Jolene Provost  Subjective/Objective Assessment:   Pt admitted with sepsis. Pt is from [facility] and at this time the plan is to return to [facility] at discharge. CSW aware of discharge plan and will arrange for return to facility at discharge. No CM needs at this time.     Action/Plan:   Anticipated DC Date:  01/04/2015   Anticipated DC Plan:  SKILLED NURSING FACILITY  In-house referral  Clinical Social Worker      DC Planning Services  CM consult      Choice offered to / List presented to:             Status of service:  Completed, signed off Medicare Important Message given?  YES (If response is "NO", the following Medicare IM given date fields will be blank) Date Medicare IM given:  12/27/2014 Medicare IM given by:  Jolene Provost Date Additional Medicare IM given:  01/10/2015 Additional Medicare IM given by:  Theophilus Kinds  Discharge Disposition:  [place]  Per UR Regulation:  Reviewed for med. necessity/level of care/duration of stay  If discussed at [place] of Stay Meetings, dates discussed:   12/31/2014  01/02/2015  01/07/2015    Comments:  01/10/15 8676 Christinia Gully, RN BSN CM Pt discharged back to [facility] today. CSW to arrange discharge to facility.   01/04/2015 [place], RN, MSN, PCCN 01/03/15  1130 Vladimir Creeks RN/CM 01/02/2015 [place], RN, MSN, PCCN 12/27/2014 [place], RN, MSN, [facility] 12/25/2014 [place], RN, MSN, [employer]

## 2014-12-25 NOTE — ED Notes (Signed)
Pt. Continuing to bite on tube and appears agitated. Dr. Jeneen Rinks notified.

## 2014-12-25 NOTE — Progress Notes (Signed)
Received call from radiologist about CVC needing to be withdrawn 4 cm and ET tube also needing to be withdrawn. Dr. Luan Pulling notified. Orders received. Will continue to monitor

## 2014-12-25 NOTE — Progress Notes (Signed)
INITIAL NUTRITION ASSESSMENT  DOCUMENTATION CODES Per approved criteria  -Obesity Unspecified   INTERVENTION: If unable to wean by in the morning recommend Initiate Vital High Protien @ 20 ml/hr via OGT and increase by 10 ml every 4 hours to goal rate of 30 ml/hr if current rate of propofol is unchanged.   Tube feeding regimen provides 720 kcal,  61 grams of protein, and 585 ml of H2O.    Will be unable to meet protein goal at this time due to amount of lipid calories being delivered  NUTRITION DIAGNOSIS: Inadequate oral intake related to inability to eat as evidenced by NPO status   Goal: hypo caloric/high protein feeding to meet current needs  Monitor: respiratory status, nutrition support plan and labs   Reason for Assessment: mechanical ventilation  74 y.o. female   ASSESSMENT:  Pt has UTI, encephalopathy and is septic. Her hx includes  COPD, Pneumonia and Cerebral palsy.  Patient is currently intubated on ventilator support MV:  6.4  L/min Temp (24hrs), Avg:100 F (37.8 C), Min:99.5 F (37.5 C), Max:100.7 F (38.2 C)  Propofol: 29.5 ml/hr (providing 778 kcal lipids q 24 hr at current rate)  Labs: sodium 146, potassium 2.8 and glucose 110.   Height: Ht Readings from Last 1 Encounters:  12/24/14 5\' 6"  (1.676 m)    Weight: Wt Readings from Last 1 Encounters:  12/25/14 216 lb 7.9 oz (98.2 kg)    Ideal Body Weight: 130#  % Ideal Body Weight: 167%  Wt Readings from Last 10 Encounters:  12/25/14 216 lb 7.9 oz (98.2 kg)  11/12/14 224 lb (101.606 kg)  06/15/14 224 lb 6.9 oz (101.8 kg)  05/29/14 220 lb (99.791 kg)  03/01/14 214 lb (97.07 kg)  01/22/14 204 lb (92.534 kg)  01/02/14 221 lb 1.9 oz (100.3 kg)  08/09/13 224 lb 13.9 oz (102 kg)  08/06/13 235 lb (106.595 kg)  06/13/13 234 lb 11.2 oz (106.459 kg)    Usual Body Weight: 214-224#  % Usual Body Weight: 100%  BMI:  Body mass index is 34.96 kg/(m^2). obesity class I  Estimated Nutritional  Needs: Kcal: 1609  Underfeeding goal: 1298-1475 kcal Protein: 88-100 gr Fluid: >1600 ml daily  Skin: intact  Diet Order: Diet NPO time specified  EDUCATION NEEDS: -No education needs identified at this time   Intake/Output Summary (Last 24 hours) at 12/25/14 1345 Last data filed at 12/25/14 1252  Gross per 24 hour  Intake   2836 ml  Output    700 ml  Net   2136 ml    Last BM: 12/23/14  Labs:   Recent Labs Lab 12/24/14 1530 12/25/14 0657  NA 153* 146*  K 3.5 2.8*  CL 91* 97  CO2 >50* 44*  BUN 23 21  CREATININE 0.51 0.41*  CALCIUM 8.8 8.2*  GLUCOSE 117* 110*    CBG (last 3)   Recent Labs  12/25/14 0849 12/25/14 1130  GLUCAP 110* 110*    Scheduled Meds: . antiseptic oral rinse  7 mL Mouth Rinse QID  . ceFEPime (MAXIPIME) IV  1 g Intravenous 3 times per day  . chlorhexidine  15 mL Mouth Rinse BID  . Chlorhexidine Gluconate Cloth  6 each Topical Q0600  . enoxaparin (LOVENOX) injection  40 mg Subcutaneous Q24H  . insulin aspart  0-20 Units Subcutaneous 6 times per day  . methylPREDNISolone (SOLU-MEDROL) injection  40 mg Intravenous Q6H  . mupirocin ointment  1 application Nasal BID  . pantoprazole (PROTONIX) IV  40  mg Intravenous Q24H  . potassium chloride  10 mEq Intravenous Q1 Hr x 4  . vancomycin  1,250 mg Intravenous Q12H    Continuous Infusions: . sodium chloride 150 mL/hr at 12/25/14 1100  . propofol 50 mcg/kg/min (12/25/14 1300)    Past Medical History  Diagnosis Date  . Cerebral palsy   . Asthma   . Seasonal allergies   . Thyroid disease   . HTN (hypertension)   . Anxiety   . Arthritis   . Breast cancer     Right breast, infiltrating ductal.  . Anginal pain   . Osteoporosis 05/08/2012  . Osteoporosis 05/08/2012  . Stroke   . Angina at rest   . Chronic steroid use   . Pneumonia 09/2013  . Dysphagia   . Hypopotassemia   . Respiratory failure   . Generalized weakness   . Urinary retention   . Dysphagia   . Acute MI   .  Hypopotassemia   . Sepsis   . Neurogenic bladder   . Reflux   . Obesity     Past Surgical History  Procedure Laterality Date  . Breast lumpectomy    . Radical abdominal hysterectomy    . Dental extraction    . Right hand surgery      Colman Cater MS,RD,CSG,LDN Office: #[phone] Pager: [phone]

## 2014-12-25 NOTE — Progress Notes (Signed)
Peripherally Inserted Central Catheter/Midline Placement  The IV Nurse has discussed with the patient and/or persons authorized to consent for the patient, the purpose of this procedure and the potential benefits and risks involved with this procedure.  The benefits include less needle sticks, lab draws from the catheter and patient may be discharged home with the catheter.  Risks include, but not limited to, infection, bleeding, blood clot (thrombus formation), and puncture of an artery; nerve damage and irregular heat beat.  Alternatives to this procedure were also discussed.  PICC/Midline Placement Documentation  PICC / Midline Single Lumen 08/30/14 PICC Left Brachial 43 cm 0 cm (Active)     PICC / Midline Double Lumen 71/06/26 PICC Left Basilic 44 cm 1 cm (Active)     PICC Triple Lumen 94/85/46 PICC Right Basilic 45 cm 0 cm (Active)  Indication for Insertion or Continuance of Line Limited venous access - need for IV therapy >5 days (PICC only);Poor Vasculature-patient has had multiple peripheral attempts or PIVs lasting less than 24 hours 12/25/2014  4:45 PM  Exposed Catheter (cm) 0 cm 12/25/2014  4:45 PM  Site Assessment Clean;Dry;Intact 12/25/2014  4:45 PM  Lumen #1 Status Flushed;Saline locked;Capped (Central line);Blood return noted 12/25/2014  4:45 PM  Lumen #2 Status Flushed;Saline locked;Capped (Central line);Blood return noted 12/25/2014  4:45 PM  Lumen #3 Status Flushed;Saline locked;Capped (Central line);Blood return noted 12/25/2014  4:45 PM  Dressing Type Transparent 12/25/2014  4:45 PM  Dressing Status Clean;Dry;Intact 12/25/2014  4:45 PM  Dressing Change Due 01/01/15 12/25/2014  4:45 PM       Roselind Messier 12/25/2014, 5:08 PM

## 2014-12-25 NOTE — ED Notes (Signed)
Propofol was increased in increments of 73mcg/kg/min per verbal order from Dr. Jeneen Rinks.

## 2014-12-25 NOTE — Progress Notes (Addendum)
Subjective: She was admitted yesterday with urinary tract infection, encephalopathy and sepsis. She developed increasing respiratory failure and was intubated and placed on mechanical ventilation. She has been significantly over ventilated and ventilator changes were made through the night and this will need to be rechecked.  Objective: Vital signs in last 24 hours: Temp:  [99.5 F (37.5 C)-100.7 F (38.2 C)] 99.5 F (37.5 C) (01/06 0731) Pulse Rate:  [66-106] 84 (01/06 0600) Resp:  [12-29] 12 (01/06 0600) BP: (96-121)/(49-87) 103/62 mmHg (01/06 0600) SpO2:  [87 %-100 %] 99 % (01/06 0600) FiO2 (%):  [40 %-50 %] 50 % (01/06 0700) Weight:  [92.8 kg (204 lb 9.4 oz)-98.2 kg (216 lb 7.9 oz)] 98.2 kg (216 lb 7.9 oz) (01/06 0500) Weight change:  Last BM Date: 12/23/14  Intake/Output from previous day: 01/05 0701 - 01/06 0700 In: 1686 [I.V.:1386; IV Piggyback:300] Out: 700 [Urine:700]  PHYSICAL EXAM General appearance: Intubated sedated on mechanical ventilation Resp: rhonchi bilaterally Cardio: regular rate and rhythm, S1, S2 normal, no murmur, click, rub or gallop GI: soft, non-tender; bowel sounds normal; no masses,  no organomegaly Extremities: extremities normal, atraumatic, no cyanosis or edema  Lab Results:  Results for orders placed or performed during the hospital encounter of 12/24/14 (from the past 48 hour(s))  I-Stat CG4 Lactic Acid, ED     Status: None   Collection Time: 12/24/14  3:26 PM  Result Value Ref Range   Lactic Acid, Venous 1.03 0.5 - 2.2 mmol/L  Basic metabolic panel     Status: Abnormal   Collection Time: 12/24/14  3:30 PM  Result Value Ref Range   Sodium 153 (H) 135 - 145 mmol/L    Comment: Please note change in reference range.   Potassium 3.5 3.5 - 5.1 mmol/L    Comment: Please note change in reference range.   Chloride 91 (L) 96 - 112 mEq/L   CO2 >50 (HH) 19 - 32 mmol/L    Comment: CRITICAL RESULT CALLED TO, READ BACK BY AND VERIFIED WITH: SHORE,L  ON 12/24/14 AT 1635 BY LOY,C    Glucose, Bld 117 (H) 70 - 99 mg/dL   BUN 23 6 - 23 mg/dL   Creatinine, Ser 0.51 0.50 - 1.10 mg/dL   Calcium 8.8 8.4 - 10.5 mg/dL   GFR calc non Af Amer >90 >90 mL/min   GFR calc Af Amer >90 >90 mL/min    Comment: (NOTE) The eGFR has been calculated using the CKD EPI equation. This calculation has not been validated in all clinical situations. eGFR's persistently <90 mL/min signify possible Chronic Kidney Disease.    Anion gap NOT CALCULATED 5 - 15  Troponin I     Status: None   Collection Time: 12/24/14  3:30 PM  Result Value Ref Range   Troponin I 0.03 <0.031 ng/mL    Comment:        NO INDICATION OF MYOCARDIAL INJURY. Please note change in reference range.   CBC with Differential     Status: Abnormal   Collection Time: 12/24/14  3:30 PM  Result Value Ref Range   WBC 5.4 4.0 - 10.5 K/uL   RBC 4.41 3.87 - 5.11 MIL/uL   Hemoglobin 11.9 (L) 12.0 - 15.0 g/dL   HCT 45.8 36.0 - 46.0 %   MCV 103.9 (H) 78.0 - 100.0 fL   MCH 27.0 26.0 - 34.0 pg   MCHC 26.0 (L) 30.0 - 36.0 g/dL   RDW 17.3 (H) 11.5 - 15.5 %   Platelets  141 (L) 150 - 400 K/uL   Neutrophils Relative % 72 43 - 77 %   Neutro Abs 3.9 1.7 - 7.7 K/uL   Lymphocytes Relative 14 12 - 46 %   Lymphs Abs 0.8 0.7 - 4.0 K/uL   Monocytes Relative 14 (H) 3 - 12 %   Monocytes Absolute 0.8 0.1 - 1.0 K/uL   Eosinophils Relative 0 0 - 5 %   Eosinophils Absolute 0.0 0.0 - 0.7 K/uL   Basophils Relative 0 0 - 1 %   Basophils Absolute 0.0 0.0 - 0.1 K/uL  Brain natriuretic peptide     Status: Abnormal   Collection Time: 12/24/14  3:30 PM  Result Value Ref Range   B Natriuretic Peptide 126.0 (H) 0.0 - 100.0 pg/mL    Comment: Please note change in reference range.  Urinalysis, Routine w reflex microscopic     Status: Abnormal   Collection Time: 12/24/14  3:40 PM  Result Value Ref Range   Color, Urine YELLOW YELLOW   APPearance CLEAR CLEAR   Specific Gravity, Urine >1.030 (H) 1.005 - 1.030   pH 5.5 5.0  - 8.0   Glucose, UA NEGATIVE NEGATIVE mg/dL   Hgb urine dipstick MODERATE (A) NEGATIVE   Bilirubin Urine NEGATIVE NEGATIVE   Ketones, ur NEGATIVE NEGATIVE mg/dL   Protein, ur 30 (A) NEGATIVE mg/dL   Urobilinogen, UA 0.2 0.0 - 1.0 mg/dL   Nitrite NEGATIVE NEGATIVE   Leukocytes, UA SMALL (A) NEGATIVE  Urine microscopic-add on     Status: Abnormal   Collection Time: 12/24/14  3:40 PM  Result Value Ref Range   Squamous Epithelial / LPF RARE RARE   WBC, UA 7-10 <3 WBC/hpf   RBC / HPF 3-6 <3 RBC/hpf   Bacteria, UA FEW (A) RARE  Blood gas, arterial     Status: Abnormal   Collection Time: 12/24/14  6:55 PM  Result Value Ref Range   FIO2 55.00 %   Delivery systems OXYGEN MASK    pH, Arterial 7.174 (LL) 7.350 - 7.450    Comment: CRITICAL RESULT CALLED TO, READ BACK BY AND VERIFIED WITH: OSBORNE,T RN AT 1905 12/24/14 ANDERSON,S RRT    pCO2 arterial ABOVE REPORTABLE RANGE, 35.0 - 45.0 mmHg    Comment:  OSBORNE,T. RN AT 1905 12/24/14 ANDERSON,S RRT   pO2, Arterial 78.5 (L) 80.0 - 100.0 mmHg   TCO2 14.9 0 - 100 mmol/L   O2 Saturation 93.2 %   Collection site RIGHT RADIAL    Drawn by [id number]    Sample type ARTERIAL    Allens test (pass/fail) PASS PASS  Troponin I     Status: None   Collection Time: 12/24/14  7:36 PM  Result Value Ref Range   Troponin I <0.03 <0.031 ng/mL    Comment:        NO INDICATION OF MYOCARDIAL INJURY. Please note change in reference range.   Blood gas, arterial ([facility] & [facility] ONLY)     Status: Abnormal   Collection Time: 12/24/14  7:55 PM  Result Value Ref Range   FIO2 50.00 %   Delivery systems VENTILATOR    Mode PRESSURE REGULATED VOLUME CONTROL    VT 550 mL   Rate 18 resp/min   Peep/cpap 5.0 cm H20   pH, Arterial 7.508 (H) 7.350 - 7.450   pCO2 arterial 62.5 (HH) 35.0 - 45.0 mmHg    Comment: CRITICAL RESULT CALLED TO, READ BACK BY AND VERIFIED WITH: Floyce Stakes RN AT 2013 12/24/14 Ouida Sills S.RRT  pO2, Arterial 118.0 (H) 80.0 - 100.0 mmHg   Bicarbonate 49.2 (H)  20.0 - 24.0 mEq/L   TCO2 44.5 0 - 100 mmol/L   Acid-Base Excess 23.6 (H) 0.0 - 2.0 mmol/L   O2 Saturation 99.0 %   Patient temperature 37.0    Collection site RIGHT RADIAL    Drawn by [id number]    Sample type ARTERIAL    Allens test (pass/fail) PASS PASS  MRSA PCR Screening     Status: Abnormal   Collection Time: 12/24/14  9:20 PM  Result Value Ref Range   MRSA by PCR POSITIVE (A) NEGATIVE    Comment:        The GeneXpert MRSA Assay (FDA approved for NASAL specimens only), is one component of a comprehensive MRSA colonization surveillance program. It is not intended to diagnose MRSA infection nor to guide [place] [address] BY FORSYTH K   Troponin I     Status: None   Collection Time: 12/25/14 12:36 AM  Result Value Ref Range   Troponin I 0.03 <0.031 ng/mL    Comment:        NO INDICATION OF MYOCARDIAL INJURY. Please note change in reference range.   Blood gas, arterial     Status: Abnormal   Collection Time: 12/25/14  5:00 AM  Result Value Ref Range   FIO2 0.40 %   Delivery systems VENTILATOR    Mode PRESSURE REGULATED VOLUME CONTROL    VT 500 mL   Rate 18.0 resp/min   Peep/cpap 5.0 cm H20   pH, Arterial 7.688 (HH) 7.350 - 7.450    Comment: CRITICAL RESULT CALLED TO, READ BACK BY AND VERIFIED WITH: LATOYA FOOTE,RN BY BFRETWELL RRT,RCP ON 12/25/14 AT 0421    pCO2 arterial 37.8 35.0 - 45.0 mmHg   pO2, Arterial 65.9 (L) 80.0 - 100.0 mmHg   Bicarbonate 44.5 (H) 20.0 - 24.0 mEq/L   TCO2 38.8 0 - 100 mmol/L   Acid-Base Excess 20.7 (H) 0.0 - 2.0 mmol/L   Acid-base deficit 20.5 (H) 0.0 - 2.0 mmol/L   O2 Saturation 97.0 %   Patient temperature 100.2    Collection site RIGHT RADIAL    Drawn by [id number]    Sample type ARTERIAL DRAW    Allens test (pass/fail) PASS PASS  CBC     Status: Abnormal   Collection Time: 12/25/14  6:57 AM  Result Value Ref Range   WBC 5.3 4.0 - 10.5 K/uL   RBC 3.85 (L) 3.87 - 5.11 MIL/uL   Hemoglobin 10.5 (L) 12.0  - 15.0 g/dL   HCT 38.1 36.0 - 46.0 %   MCV 99.0 78.0 - 100.0 fL   MCH 27.3 26.0 - 34.0 pg   MCHC 27.6 (L) 30.0 - 36.0 g/dL   RDW 16.5 (H) 11.5 - 15.5 %   Platelets 97 (L) 150 - 400 K/uL    Comment: DELTA CHECK NOTED RESULT REPEATED AND VERIFIED SPECIMEN CHECKED FOR CLOTS PLATELET COUNT CONFIRMED BY SMEAR   Comprehensive metabolic panel     Status: Abnormal   Collection Time: 12/25/14  6:57 AM  Result Value Ref Range   Sodium 146 (H) 135 - 145 mmol/L    Comment: Please note change in reference range.   Potassium 2.8 (L) 3.5 - 5.1 mmol/L    Comment: DELTA CHECK NOTED Please note change in reference range.    Chloride 97 96 - 112 mEq/L   CO2 44 (HH) 19 - 32 mmol/L  Comment: CRITICAL RESULT CALLED TO, READ BACK BY AND VERIFIED WITH: MCDANIEL,M AT 7:35AM ON 12/25/14 BY FESTERMAN,C    Glucose, Bld 110 (H) 70 - 99 mg/dL   BUN 21 6 - 23 mg/dL   Creatinine, Ser 0.41 (L) 0.50 - 1.10 mg/dL   Calcium 8.2 (L) 8.4 - 10.5 mg/dL   Total Protein 4.9 (L) 6.0 - 8.3 g/dL   Albumin 2.7 (L) 3.5 - 5.2 g/dL   AST 14 0 - 37 U/L   ALT 8 0 - 35 U/L   Alkaline Phosphatase 44 39 - 117 U/L   Total Bilirubin 0.8 0.3 - 1.2 mg/dL   GFR calc non Af Amer >90 >90 mL/min   GFR calc Af Amer >90 >90 mL/min    Comment: (NOTE) The eGFR has been calculated using the CKD EPI equation. This calculation has not been validated in all clinical situations. eGFR's persistently <90 mL/min signify possible Chronic Kidney Disease.    Anion gap 5 5 - 15  Troponin I     Status: None   Collection Time: 12/25/14  6:58 AM  Result Value Ref Range   Troponin I <0.03 <0.031 ng/mL    Comment:        NO INDICATION OF MYOCARDIAL INJURY. Please note change in reference range.   Blood gas, arterial     Status: Abnormal   Collection Time: 12/25/14  7:50 AM  Result Value Ref Range   FIO2 0.50 %   O2 Content 50.0 L/min   Delivery systems VENTILATOR    Mode PRESSURE REGULATED VOLUME CONTROL    VT 500 mL   Rate 12  resp/min   Peep/cpap 5.0 cm H20   pH, Arterial 7.588 (H) 7.350 - 7.450   pCO2 arterial 45.1 (H) 35.0 - 45.0 mmHg   pO2, Arterial 90.9 80.0 - 100.0 mmHg   Bicarbonate 43.3 (H) 20.0 - 24.0 mEq/L   TCO2 38.5 0 - 100 mmol/L   Acid-Base Excess 19.1 (H) 0.0 - 2.0 mmol/L   O2 Saturation 98.5 %   Patient temperature 37.0    Collection site LEFT RADIAL    Drawn by [id number]    Sample type ARTERIAL    Allens test (pass/fail) PASS PASS    ABGS  Recent Labs  12/25/14 0750  PHART 7.588*  PO2ART 90.9  TCO2 38.5  HCO3 43.3*   CULTURES Recent Results (from the past 240 hour(s))  MRSA PCR Screening     Status: Abnormal   Collection Time: 12/24/14  9:20 PM  Result Value Ref Range Status   MRSA by PCR POSITIVE (A) NEGATIVE Final    Comment:        The GeneXpert MRSA Assay (FDA approved for NASAL specimens only), is one component of a comprehensive MRSA colonization surveillance program. It is not intended to diagnose MRSA infection nor to guide or [place] [address] BY FORSYTH K    Studies/Results: Ct Chest Wo Contrast  12/24/2014   CLINICAL DATA:  Fever, hypoxia  EXAM: CT CHEST WITHOUT CONTRAST  TECHNIQUE: Multidetector CT imaging of the chest was performed following the standard protocol without IV contrast.  COMPARISON:  Plain film from earlier in the same day  FINDINGS: The lungs are with well aerated bilaterally with evidence of vascular congestion and some patchy ground-glass changes likely related to parenchymal edema. Mild lower lobe atelectatic changes are seen. No sizable effusion is noted. The hilar and mediastinal structures show no significant lymphadenopathy. The pulmonary artery is mildly  prominent consistent with the vascular congestion.  Scanning into the upper abdomen reveals no acute abnormality. Bony structures show degenerative change of the thoracic spine. Changes consistent with a vertebral hemangioma are noted at T8. At the inferior aspect  of L1 on the last image there is a lucency within the L1 vertebral body. This is stable from a prior exam from 03/13/2014 and likely represent a second hemangioma.  IMPRESSION: Vertebral hemangiomas.  Mild vascular congestion and bibasilar atelectatic changes similar to that seen on the prior plain film examination.   Electronically Signed   By: Inez Catalina M.D.   On: 12/24/2014 18:06   Dg Chest Port 1 View  12/25/2014   CLINICAL DATA:  Respiratory failure. Ventilated patient. Morning portable.  EXAM: PORTABLE CHEST - 1 VIEW  COMPARISON:  12/24/2014  FINDINGS: Endotracheal tube tip measures 4.8 cm above the carina. Right central venous catheter projects over the cavoatrial junction. No pneumothorax. Cardiac enlargement. Patchy infiltration or atelectasis in the lung bases. Mild vascular congestion. No definite edema. No blunting of costophrenic angles.  IMPRESSION: Appliances appear in satisfactory position. Cardiac enlargement with mild vascular congestion. Atelectasis in the lung bases. No significant changes since yesterday.   Electronically Signed   By: Lucienne Capers M.D.   On: 12/25/2014 06:06   Dg Chest Port 1 View  12/24/2014   CLINICAL DATA:  Shortness of breath and poor oxygenation.  EXAM: PORTABLE CHEST - 1 VIEW  COMPARISON:  11/12/2014  FINDINGS: The heart is enlarged but stable. There is central vascular congestion and pulmonary edema with small bilateral pleural effusions consistent with CHF.  IMPRESSION: CHF.   Electronically Signed   By: Kalman Jewels M.D.   On: 12/24/2014 15:48   Dg Chest Port 1v Same Day  12/24/2014   CLINICAL DATA:  Endotracheal tube and central line placement. Initial encounter.  EXAM: PORTABLE CHEST - 1 VIEW SAME DAY  COMPARISON:  Chest radiograph performed earlier today at 3:38 p.m., and CT of the chest performed earlier today at 6:02 p.m.  FINDINGS: The patient endotracheal tube is seen ending 2-3 cm above the carina. The right IJ line is noted crossing the midline  and extending into the left brachiocephalic vein. This could be retracted 10 cm and readvanced.  The lungs are hypoexpanded. Vascular crowding and vascular congestion noted. Bibasilar airspace opacities could reflect mild interstitial edema. No definite pleural effusion or pneumothorax is seen.  The cardiomediastinal silhouette is borderline enlarged. No acute osseous abnormalities are seen.  IMPRESSION: 1. Endotracheal tube seen ending 2-3 cm above the carina. 2. Right IJ line noted crossing the midline and extending into the left brachiocephalic vein. This could be retracted 10 cm and readvanced. 3. Lungs hypoexpanded. Vascular congestion and borderline cardiomegaly noted. Bibasilar airspace opacities could reflect mild interstitial edema.  These results were called by telephone at the time of interpretation on 12/24/2014 at 8:12 pm to Dr. Tanna Furry, who verbally acknowledged these results.   Electronically Signed   By: Garald Balding M.D.   On: 12/24/2014 20:12    Medications:  Prior to Admission:  Prescriptions prior to admission  Medication Sig Dispense Refill Last Dose  . acetaminophen (TYLENOL) 325 MG tablet Take 2 tablets (650 mg total) by mouth every 6 (six) hours as needed for mild pain (or Fever >/= 101).   unknown  . albuterol (PROVENTIL HFA;VENTOLIN HFA) 108 (90 BASE) MCG/ACT inhaler Inhale 2 puffs into the lungs every 4 (four) hours as needed. Shortness of breath 1  Inhaler 12 unknown  . albuterol (PROVENTIL) (2.5 MG/3ML) 0.083% nebulizer solution Take 2.5 mg by nebulization 4 (four) times daily.   12/24/2014 at 1200  . ALPRAZolam (XANAX) 1 MG tablet Take 1 mg by mouth 4 (four) times daily.    12/24/2014 at 1400  . Amino Acids-Protein Hydrolys (FEEDING SUPPLEMENT, PRO-STAT SUGAR FREE 64,) LIQD Take 30 mLs by mouth 2 (two) times daily with a meal.   12/24/2014 at 800a  . aspirin 325 MG tablet Take 1 tablet (325 mg total) by mouth daily. 30 tablet 12 12/24/2014 at [place]  . atorvastatin (LIPITOR) 40 MG  tablet Take 40 mg by mouth daily.   12/24/2014 at [place]  . baclofen (LIORESAL) 10 MG tablet Take 5 mg by mouth 3 (three) times daily.   12/24/2014 at 1400  . cefTRIAXone (ROCEPHIN) 1 G injection Inject 1 g into the muscle once. Last administered on 12/23/2014 at 22:00 IM to right hip   12/23/2014  . cholecalciferol (VITAMIN D) 1000 UNITS tablet Take 1,000 Units by mouth daily.   12/24/2014 at [place]  . clopidogrel (PLAVIX) 75 MG tablet Take 1 tablet (75 mg total) by mouth daily with breakfast.   12/24/2014 at [place]  . Cranberry 450 MG CAPS Take 1 capsule by mouth daily.   12/24/2014 at 900a  . guaiFENesin (MUCINEX) 600 MG 12 hr tablet Take 600 mg by mouth 2 (two) times daily.   12/24/2014 at 800a  . haloperidol (HALDOL) 0.5 MG tablet Take 0.5 mg by mouth 2 (two) times daily.    12/24/2014 at 900a  . levothyroxine (SYNTHROID, LEVOTHROID) 88 MCG tablet Take 1 tablet (88 mcg total) by mouth daily before breakfast. 30 tablet 12 12/24/2014 at 600a  . loperamide (IMODIUM) 2 MG capsule Take 1 capsule (2 mg total) by mouth as needed for diarrhea or loose stools. 30 capsule 0 unknown  . loratadine (CLARITIN) 10 MG tablet Take 10 mg by mouth daily.   12/24/2014 at [place]  . nitroGLYCERIN (NITROSTAT) 0.4 MG SL tablet Place 1 tablet (0.4 mg total) under the tongue every 5 (five) minutes as needed for chest pain. 25 tablet 12 unknown  . pantoprazole (PROTONIX) 40 MG tablet Take 1 tablet (40 mg total) by mouth daily. 30 tablet 12 12/24/2014 at 600a  . polyethylene glycol (MIRALAX / GLYCOLAX) packet Take 17 g by mouth daily.   12/24/2014 at 900a  . potassium chloride SA (K-DUR,KLOR-CON) 20 MEQ tablet Take 10 mEq by mouth daily.    12/24/2014 at [place]  . predniSONE (DELTASONE) 5 MG tablet Take 5 mg by mouth daily with breakfast.   12/24/2014 at [place]  . ranolazine (RANEXA) 500 MG 12 hr tablet Take 1 tablet (500 mg total) by mouth 2 (two) times daily. 60 tablet 3 12/24/2014 at [place]  . saccharomyces boulardii (FLORASTOR) 250 MG capsule Take 250 mg by  mouth 2 (two) times daily.   12/24/2014 at 800a  . sertraline (ZOLOFT) 100 MG tablet Take 1 tablet (100 mg total) by mouth daily.   12/24/2014 at 900a  . temazepam (RESTORIL) 7.5 MG capsule Take 7.5 mg by mouth at bedtime as needed for sleep.   unknown  . traMADol (ULTRAM) 50 MG tablet Take 50 mg by mouth every 6 (six) hours as needed for moderate pain.   unknown  . furosemide (LASIX) 20 MG tablet Take 20 mg by mouth daily.   Taking  . trolamine salicylate (ASPERCREME) 10 % cream Apply topically as needed for muscle pain. (Patient not taking:  Reported on 12/24/2014) 85 g 0 Taking   Scheduled: . antiseptic oral rinse  7 mL Mouth Rinse QID  . ceFEPime (MAXIPIME) IV  1 g Intravenous 3 times per day  . chlorhexidine  15 mL Mouth Rinse BID  . Chlorhexidine Gluconate Cloth  6 each Topical Q0600  . enoxaparin (LOVENOX) injection  40 mg Subcutaneous Q24H  . mupirocin ointment  1 application Nasal BID  . pantoprazole (PROTONIX) IV  40 mg Intravenous Q24H  . potassium chloride  10 mEq Intravenous Q1 Hr x 4  . vancomycin  1,250 mg Intravenous Q12H   Continuous: . sodium chloride 150 mL/hr at 12/25/14 0600   IHK:VQQVZD chloride, ondansetron (ZOFRAN) IV  Assesment: She has acute respiratory failure on chronic respiratory failure. She is intubated and on the ventilator. She is septic probably from urinary tract infection but she has had multiple bouts of pneumonia as well. At baseline she has cerebral palsy and is mostly wheelchair bound. She has cardiac disease with previous MI. She is hypokalemic this morning Active Problems:   Morbid obesity   Cerebral palsy   Hypertension   Sepsis   Acute and chronic respiratory failure with hypercapnia   Encephalopathy, metabolic   UTI (urinary tract infection)    Plan: Recheck ABG and see where she is. She will have runs of potassium. Continue other treatments. Check blood sugars and insulin as needed. She will be on resistance sliding scale. I will give her  steroids because she has pretty severe COPD at baseline and has been on and off steroids frequently    LOS: 1 day   Therasa Lorenzi L 12/25/2014, 8:11 AM

## 2014-12-25 NOTE — Progress Notes (Signed)
Lab reported blood culture is positive for gram positive cocci in clusters; MD notified; no new orders. Will continue to monitor.

## 2014-12-26 ENCOUNTER — Inpatient Hospital Stay (HOSPITAL_COMMUNITY): Payer: Medicare Other

## 2014-12-26 LAB — BLOOD GAS, ARTERIAL
Acid-Base Excess: 11.6 mmol/L — ABNORMAL HIGH (ref 0.0–2.0)
Bicarbonate: 35.9 mEq/L — ABNORMAL HIGH (ref 20.0–24.0)
DRAWN BY: 317771
FIO2: 0.4 %
O2 Saturation: 98.2 %
PCO2 ART: 49.6 mmHg — AB (ref 35.0–45.0)
PEEP/CPAP: 5 cmH2O
Patient temperature: 37
RATE: 10 resp/min
TCO2: 32.7 mmol/L (ref 0–100)
VT: 500 mL
pH, Arterial: 7.473 — ABNORMAL HIGH (ref 7.350–7.450)
pO2, Arterial: 101 mmHg — ABNORMAL HIGH (ref 80.0–100.0)

## 2014-12-26 LAB — CBC WITH DIFFERENTIAL/PLATELET
BASOS PCT: 0 % (ref 0–1)
Basophils Absolute: 0 10*3/uL (ref 0.0–0.1)
Eosinophils Absolute: 0 10*3/uL (ref 0.0–0.7)
Eosinophils Relative: 0 % (ref 0–5)
HCT: 34.7 % — ABNORMAL LOW (ref 36.0–46.0)
HEMOGLOBIN: 10.4 g/dL — AB (ref 12.0–15.0)
LYMPHS ABS: 0.7 10*3/uL (ref 0.7–4.0)
Lymphocytes Relative: 18 % (ref 12–46)
MCH: 27.2 pg (ref 26.0–34.0)
MCHC: 30 g/dL (ref 30.0–36.0)
MCV: 90.6 fL (ref 78.0–100.0)
MONOS PCT: 3 % (ref 3–12)
Monocytes Absolute: 0.1 10*3/uL (ref 0.1–1.0)
Neutro Abs: 3 10*3/uL (ref 1.7–7.7)
Neutrophils Relative %: 79 % — ABNORMAL HIGH (ref 43–77)
Platelets: 96 10*3/uL — ABNORMAL LOW (ref 150–400)
RBC: 3.83 MIL/uL — AB (ref 3.87–5.11)
RDW: 16.5 % — ABNORMAL HIGH (ref 11.5–15.5)
WBC: 3.8 10*3/uL — AB (ref 4.0–10.5)

## 2014-12-26 LAB — BASIC METABOLIC PANEL
ANION GAP: 7 (ref 5–15)
BUN: 23 mg/dL (ref 6–23)
CO2: 36 mmol/L — ABNORMAL HIGH (ref 19–32)
CREATININE: 0.34 mg/dL — AB (ref 0.50–1.10)
Calcium: 8.3 mg/dL — ABNORMAL LOW (ref 8.4–10.5)
Chloride: 97 mEq/L (ref 96–112)
GFR calc Af Amer: >90 mL/min (ref >90)
Glucose, Bld: 129 mg/dL — ABNORMAL HIGH (ref 70–99)
Potassium: 3.6 mmol/L (ref 3.5–5.1)
SODIUM: 140 mmol/L (ref 135–145)

## 2014-12-26 LAB — GLUCOSE, CAPILLARY
Glucose-Capillary: 116 mg/dL — ABNORMAL HIGH (ref 70–99)
Glucose-Capillary: 133 mg/dL — ABNORMAL HIGH (ref 70–99)
Glucose-Capillary: 110 mg/dL — ABNORMAL HIGH (ref 70–99)
Glucose-Capillary: 134 mg/dL — ABNORMAL HIGH (ref 70–99)
Glucose-Capillary: 97 mg/dL (ref 70–99)

## 2014-12-26 LAB — TRIGLYCERIDES: TRIGLYCERIDES: 137 mg/dL (ref <150)

## 2014-12-26 LAB — BRAIN NATRIURETIC PEPTIDE: B Natriuretic Peptide: 107 pg/mL — ABNORMAL HIGH (ref 0.0–100.0)

## 2014-12-26 MED ORDER — FENTANYL CITRATE 0.05 MG/ML IJ SOLN
25.0000 ug/h | INTRAMUSCULAR | Status: DC
  Administered 2014-12-26 – 2014-12-28 (×2): 25 ug/h via INTRAVENOUS
  Administered 2014-12-28 – 2014-12-29 (×3): 200 ug/h via INTRAVENOUS
  Filled 2014-12-26 (×6): qty 50

## 2014-12-26 MED ORDER — FENTANYL CITRATE 0.05 MG/ML IJ SOLN
25.0000 ug | INTRAMUSCULAR | Status: DC | PRN
Start: 2014-12-26 — End: 2015-01-10
  Administered 2014-12-26 – 2015-01-09 (×12): 25 ug via INTRAVENOUS
  Filled 2014-12-26 (×6): qty 2

## 2014-12-26 NOTE — Progress Notes (Signed)
Subjective: She remains intubated and on the ventilator. She is very agitated and indicates that she has pain. She is not on any pain medication and this will be added. Her central line was not in position yesterday. She now has PICC line.  Objective: Vital signs in last 24 hours: Temp:  [98 F (36.7 C)-99.9 F (37.7 C)] 98 F (36.7 C) (01/07 0400) Pulse Rate:  [49-105] 49 (01/07 0700) Resp:  [9-24] 10 (01/07 0700) BP: (84-140)/(32-89) 124/60 mmHg (01/07 0700) SpO2:  [90 %-100 %] 98 % (01/07 0700) FiO2 (%):  [40 %-50 %] 40 % (01/07 0709) Weight:  [97 kg (213 lb 13.5 oz)] 97 kg (213 lb 13.5 oz) (01/07 0500) Weight change: 1.291 kg (2 lb 13.5 oz) Last BM Date: 12/23/14  Intake/Output from previous day: 01/06 0701 - 01/07 0700 In: 3977.8 [I.V.:2927.8; IV Piggyback:1050] Out: 625 [Urine:625]  PHYSICAL EXAM General appearance: Intubated on sedation but very agitated Resp: rhonchi bilaterally Cardio: regular rate and rhythm, S1, S2 normal, no murmur, click, rub or gallop GI: soft, non-tender; bowel sounds normal; no masses,  no organomegaly Extremities: extremities normal, atraumatic, no cyanosis or edema  Lab Results:  Results for orders placed or performed during the hospital encounter of 12/24/14 (from the past 48 hour(s))  I-Stat CG4 Lactic Acid, ED     Status: None   Collection Time: 12/24/14  3:26 PM  Result Value Ref Range   Lactic Acid, Venous 1.03 0.5 - 2.2 mmol/L  Basic metabolic panel     Status: Abnormal   Collection Time: 12/24/14  3:30 PM  Result Value Ref Range   Sodium 153 (H) 135 - 145 mmol/L    Comment: Please note change in reference range.   Potassium 3.5 3.5 - 5.1 mmol/L    Comment: Please note change in reference range.   Chloride 91 (L) 96 - 112 mEq/L   CO2 >50 (HH) 19 - 32 mmol/L    Comment: CRITICAL RESULT CALLED TO, READ BACK BY AND VERIFIED WITH: SHORE,L ON 12/24/14 AT 1635 BY LOY,C    Glucose, Bld 117 (H) 70 - 99 mg/dL   BUN 23 6 - 23 mg/dL    Creatinine, Ser 0.51 0.50 - 1.10 mg/dL   Calcium 8.8 8.4 - 10.5 mg/dL   GFR calc non Af Amer >90 >90 mL/min   GFR calc Af Amer >90 >90 mL/min    Comment: (NOTE) The eGFR has been calculated using the CKD EPI equation. This calculation has not been validated in all clinical situations. eGFR's persistently <90 mL/min signify possible Chronic Kidney Disease.    Anion gap NOT CALCULATED 5 - 15  Culture, blood (routine x 2)     Status: None (Preliminary result)   Collection Time: 12/24/14  3:30 PM  Result Value Ref Range   Specimen Description BLOOD RIGHT WRIST DRAWN BY RN [phone]    Special Requests BOTTLES DRAWN AEROBIC AND ANAEROBIC 6CC    Culture NO GROWTH 1 DAY    Report Status PENDING   Culture, blood (routine x 2)     Status: None (Preliminary result)   Collection Time: 12/24/14  3:30 PM  Result Value Ref Range   Specimen Description BLOOD LEFT HAND DRAWN BY RN [phone]    Special Requests BOTTLES DRAWN AEROBIC AND ANAEROBIC 6CC     Culture      GRAM POSITIVE COCCI IN CLUSTERS Note: Gram Stain Report Called to,Read Back By and Verified With: PHILLIPS C. AT 6222 ON 12/25/14 BY BAUGHAM M.  Performed at [facility] Performed at [facility]    Report Status PENDING   Troponin I     Status: None   Collection Time: 12/24/14  3:30 PM  Result Value Ref Range   Troponin I 0.03 <0.031 ng/mL    Comment:        NO INDICATION OF MYOCARDIAL INJURY. Please note change in reference range.   CBC with Differential     Status: Abnormal   Collection Time: 12/24/14  3:30 PM  Result Value Ref Range   WBC 5.4 4.0 - 10.5 K/uL   RBC 4.41 3.87 - 5.11 MIL/uL   Hemoglobin 11.9 (L) 12.0 - 15.0 g/dL   HCT 45.8 36.0 - 46.0 %   MCV 103.9 (H) 78.0 - 100.0 fL   MCH 27.0 26.0 - 34.0 pg   MCHC 26.0 (L) 30.0 - 36.0 g/dL   RDW 17.3 (H) 11.5 - 15.5 %   Platelets 141 (L) 150 - 400 K/uL   Neutrophils Relative % 72 43 - 77 %   Neutro Abs 3.9 1.7 - 7.7 K/uL   Lymphocytes Relative 14 12 - 46 %    Lymphs Abs 0.8 0.7 - 4.0 K/uL   Monocytes Relative 14 (H) 3 - 12 %   Monocytes Absolute 0.8 0.1 - 1.0 K/uL   Eosinophils Relative 0 0 - 5 %   Eosinophils Absolute 0.0 0.0 - 0.7 K/uL   Basophils Relative 0 0 - 1 %   Basophils Absolute 0.0 0.0 - 0.1 K/uL  Brain natriuretic peptide     Status: Abnormal   Collection Time: 12/24/14  3:30 PM  Result Value Ref Range   B Natriuretic Peptide 126.0 (H) 0.0 - 100.0 pg/mL    Comment: Please note change in reference range.  Urinalysis, Routine w reflex microscopic     Status: Abnormal   Collection Time: 12/24/14  3:40 PM  Result Value Ref Range   Color, Urine YELLOW YELLOW   APPearance CLEAR CLEAR   Specific Gravity, Urine >1.030 (H) 1.005 - 1.030   pH 5.5 5.0 - 8.0   Glucose, UA NEGATIVE NEGATIVE mg/dL   Hgb urine dipstick MODERATE (A) NEGATIVE   Bilirubin Urine NEGATIVE NEGATIVE   Ketones, ur NEGATIVE NEGATIVE mg/dL   Protein, ur 30 (A) NEGATIVE mg/dL   Urobilinogen, UA 0.2 0.0 - 1.0 mg/dL   Nitrite NEGATIVE NEGATIVE   Leukocytes, UA SMALL (A) NEGATIVE  Urine culture     Status: None   Collection Time: 12/24/14  3:40 PM  Result Value Ref Range   Specimen Description URINE, CATHETERIZED    Special Requests NONE    Colony Count NO GROWTH Performed at [facility]     Culture NO GROWTH Performed at [facility]     Report Status 12/25/2014 FINAL   Urine microscopic-add on     Status: Abnormal   Collection Time: 12/24/14  3:40 PM  Result Value Ref Range   Squamous Epithelial / LPF RARE RARE   WBC, UA 7-10 <3 WBC/hpf   RBC / HPF 3-6 <3 RBC/hpf   Bacteria, UA FEW (A) RARE  Blood gas, arterial     Status: Abnormal   Collection Time: 12/24/14  6:55 PM  Result Value Ref Range   FIO2 55.00 %   Delivery systems OXYGEN MASK    pH, Arterial 7.174 (LL) 7.350 - 7.450    Comment: CRITICAL RESULT CALLED TO, READ BACK BY AND VERIFIED WITH: OSBORNE,T RN AT 1905 12/24/14 ANDERSON,S  RRT    pCO2 arterial ABOVE REPORTABLE RANGE,  35.0 - 45.0 mmHg    Comment:  OSBORNE,T. RN AT 1905 12/24/14 ANDERSON,S RRT   pO2, Arterial 78.5 (L) 80.0 - 100.0 mmHg   TCO2 14.9 0 - 100 mmol/L   O2 Saturation 93.2 %   Collection site RIGHT RADIAL    Drawn by [id number]    Sample type ARTERIAL    Allens test (pass/fail) PASS PASS  Troponin I     Status: None   Collection Time: 12/24/14  7:36 PM  Result Value Ref Range   Troponin I <0.03 <0.031 ng/mL    Comment:        NO INDICATION OF MYOCARDIAL INJURY. Please note change in reference range.   Blood gas, arterial ([facility] & [facility] ONLY)     Status: Abnormal   Collection Time: 12/24/14  7:55 PM  Result Value Ref Range   FIO2 50.00 %   Delivery systems VENTILATOR    Mode PRESSURE REGULATED VOLUME CONTROL    VT 550 mL   Rate 18 resp/min   Peep/cpap 5.0 cm H20   pH, Arterial 7.508 (H) 7.350 - 7.450   pCO2 arterial 62.5 (HH) 35.0 - 45.0 mmHg    Comment: CRITICAL RESULT CALLED TO, READ BACK BY AND VERIFIED WITH: KELLY A. RN AT 2013 12/24/14 ANDERSON S.RRT    pO2, Arterial 118.0 (H) 80.0 - 100.0 mmHg   Bicarbonate 49.2 (H) 20.0 - 24.0 mEq/L   TCO2 44.5 0 - 100 mmol/L   Acid-Base Excess 23.6 (H) 0.0 - 2.0 mmol/L   O2 Saturation 99.0 %   Patient temperature 37.0    Collection site RIGHT RADIAL    Drawn by [id number]    Sample type ARTERIAL    Allens test (pass/fail) PASS PASS  MRSA PCR Screening     Status: Abnormal   Collection Time: 12/24/14  9:20 PM  Result Value Ref Range   MRSA by PCR POSITIVE (A) NEGATIVE    Comment:        The GeneXpert MRSA Assay (FDA approved for NASAL specimens only), is one component of a comprehensive MRSA colonization surveillance program. It is not intended to diagnose MRSA infection nor to guide [place] [address] BY FORSYTH K   Troponin I     Status: None   Collection Time: 12/25/14 12:36 AM  Result Value Ref Range   Troponin I 0.03 <0.031 ng/mL    Comment:        NO INDICATION OF MYOCARDIAL INJURY. Please note  change in reference range.   Blood gas, arterial     Status: Abnormal   Collection Time: 12/25/14  5:00 AM  Result Value Ref Range   FIO2 0.40 %   Delivery systems VENTILATOR    Mode PRESSURE REGULATED VOLUME CONTROL    VT 500 mL   Rate 18.0 resp/min   Peep/cpap 5.0 cm H20   pH, Arterial 7.688 (HH) 7.350 - 7.450    Comment: CRITICAL RESULT CALLED TO, READ BACK BY AND VERIFIED WITH: LATOYA FOOTE,RN BY BFRETWELL RRT,RCP ON 12/25/14 AT 0421    pCO2 arterial 37.8 35.0 - 45.0 mmHg   pO2, Arterial 65.9 (L) 80.0 - 100.0 mmHg   Bicarbonate 44.5 (H) 20.0 - 24.0 mEq/L   TCO2 38.8 0 - 100 mmol/L   Acid-Base Excess 20.7 (H) 0.0 - 2.0 mmol/L   Acid-base deficit 20.5 (H) 0.0 - 2.0 mmol/L   O2 Saturation 97.0 %   Patient   temperature 100.2    Collection site RIGHT RADIAL    Drawn by [id number]    Sample type ARTERIAL DRAW    Allens test (pass/fail) PASS PASS  CBC     Status: Abnormal   Collection Time: 12/25/14  6:57 AM  Result Value Ref Range   WBC 5.3 4.0 - 10.5 K/uL   RBC 3.85 (L) 3.87 - 5.11 MIL/uL   Hemoglobin 10.5 (L) 12.0 - 15.0 g/dL   HCT 38.1 36.0 - 46.0 %   MCV 99.0 78.0 - 100.0 fL   MCH 27.3 26.0 - 34.0 pg   MCHC 27.6 (L) 30.0 - 36.0 g/dL   RDW 16.5 (H) 11.5 - 15.5 %   Platelets 97 (L) 150 - 400 K/uL    Comment: DELTA CHECK NOTED RESULT REPEATED AND VERIFIED SPECIMEN CHECKED FOR CLOTS PLATELET COUNT CONFIRMED BY SMEAR   Comprehensive metabolic panel     Status: Abnormal   Collection Time: 12/25/14  6:57 AM  Result Value Ref Range   Sodium 146 (H) 135 - 145 mmol/L    Comment: Please note change in reference range.   Potassium 2.8 (L) 3.5 - 5.1 mmol/L    Comment: DELTA CHECK NOTED Please note change in reference range.    Chloride 97 96 - 112 mEq/L   CO2 44 (HH) 19 - 32 mmol/L    Comment: CRITICAL RESULT CALLED TO, READ BACK BY AND VERIFIED WITH: MCDANIEL,M AT 7:35AM ON 12/25/14 BY FESTERMAN,C    Glucose, Bld 110 (H) 70 - 99 mg/dL   BUN 21 6 - 23 mg/dL   Creatinine, Ser  0.41 (L) 0.50 - 1.10 mg/dL   Calcium 8.2 (L) 8.4 - 10.5 mg/dL   Total Protein 4.9 (L) 6.0 - 8.3 g/dL   Albumin 2.7 (L) 3.5 - 5.2 g/dL   AST 14 0 - 37 U/L   ALT 8 0 - 35 U/L   Alkaline Phosphatase 44 39 - 117 U/L   Total Bilirubin 0.8 0.3 - 1.2 mg/dL   GFR calc non Af Amer >90 >90 mL/min   GFR calc Af Amer >90 >90 mL/min    Comment: (NOTE) The eGFR has been calculated using the CKD EPI equation. This calculation has not been validated in all clinical situations. eGFR's persistently <90 mL/min signify possible Chronic Kidney Disease.    Anion gap 5 5 - 15  Troponin I     Status: None   Collection Time: 12/25/14  6:58 AM  Result Value Ref Range   Troponin I <0.03 <0.031 ng/mL    Comment:        NO INDICATION OF MYOCARDIAL INJURY. Please note change in reference range.   Blood gas, arterial     Status: Abnormal   Collection Time: 12/25/14  7:50 AM  Result Value Ref Range   FIO2 0.50 %   O2 Content 50.0 L/min   Delivery systems VENTILATOR    Mode PRESSURE REGULATED VOLUME CONTROL    VT 500 mL   Rate 12 resp/min   Peep/cpap 5.0 cm H20   pH, Arterial 7.588 (H) 7.350 - 7.450   pCO2 arterial 45.1 (H) 35.0 - 45.0 mmHg   pO2, Arterial 90.9 80.0 - 100.0 mmHg   Bicarbonate 43.3 (H) 20.0 - 24.0 mEq/L   TCO2 38.5 0 - 100 mmol/L   Acid-Base Excess 19.1 (H) 0.0 - 2.0 mmol/L   O2 Saturation 98.5 %   Patient temperature 37.0    Collection site LEFT RADIAL    Drawn by  [id number]    Sample type ARTERIAL    Allens test (pass/fail) PASS PASS  Glucose, capillary     Status: Abnormal   Collection Time: 12/25/14  8:49 AM  Result Value Ref Range   Glucose-Capillary 110 (H) 70 - 99 mg/dL   Comment 1 Documented in Chart    Comment 2 Notify RN   Blood gas, arterial     Status: Abnormal   Collection Time: 12/25/14 10:13 AM  Result Value Ref Range   FIO2 50.00 %   Delivery systems VENTILATOR    Mode PRESSURE REGULATED VOLUME CONTROL    VT 500 mL   Rate 10.0 resp/min   Peep/cpap 5.0 cm H20    pH, Arterial 7.525 (H) 7.350 - 7.450   pCO2 arterial 50.5 (H) 35.0 - 45.0 mmHg   pO2, Arterial 110.0 (H) 80.0 - 100.0 mmHg   Bicarbonate 41.5 (H) 20.0 - 24.0 mEq/L   TCO2 36.9 0 - 100 mmol/L   Acid-Base Excess 17.1 (H) 0.0 - 2.0 mmol/L   O2 Saturation 98.7 %   Patient temperature 37.0    Collection site RIGHT RADIAL    Drawn by COLLECTED BY RT    Sample type ARTERIAL    Allens test (pass/fail) PASS PASS  Glucose, capillary     Status: Abnormal   Collection Time: 12/25/14 11:30 AM  Result Value Ref Range   Glucose-Capillary 110 (H) 70 - 99 mg/dL   Comment 1 Documented in Chart    Comment 2 Notify RN   Potassium     Status: None   Collection Time: 12/25/14  3:32 PM  Result Value Ref Range   Potassium 3.8 3.5 - 5.1 mmol/L    Comment: Please note change in reference range. DELTA CHECK NOTED   Glucose, capillary     Status: Abnormal   Collection Time: 12/25/14  5:17 PM  Result Value Ref Range   Glucose-Capillary 148 (H) 70 - 99 mg/dL   Comment 1 Documented in Chart    Comment 2 Notify RN   Glucose, capillary     Status: Abnormal   Collection Time: 12/25/14  8:19 PM  Result Value Ref Range   Glucose-Capillary 127 (H) 70 - 99 mg/dL  Glucose, capillary     Status: Abnormal   Collection Time: 12/25/14 11:50 PM  Result Value Ref Range   Glucose-Capillary 124 (H) 70 - 99 mg/dL  Glucose, capillary     Status: Abnormal   Collection Time: 12/26/14  3:38 AM  Result Value Ref Range   Glucose-Capillary 116 (H) 70 - 99 mg/dL  Triglycerides     Status: None   Collection Time: 12/26/14  4:34 AM  Result Value Ref Range   Triglycerides 137 <150 mg/dL  CBC with Differential     Status: Abnormal   Collection Time: 12/26/14  4:34 AM  Result Value Ref Range   WBC 3.8 (L) 4.0 - 10.5 K/uL   RBC 3.83 (L) 3.87 - 5.11 MIL/uL   Hemoglobin 10.4 (L) 12.0 - 15.0 g/dL   HCT 34.7 (L) 36.0 - 46.0 %   MCV 90.6 78.0 - 100.0 fL    Comment: DELTA CHECK NOTED RESULT REPEATED AND VERIFIED    MCH 27.2  26.0 - 34.0 pg   MCHC 30.0 30.0 - 36.0 g/dL   RDW 16.5 (H) 11.5 - 15.5 %   Platelets 96 (L) 150 - 400 K/uL    Comment: SPECIMEN CHECKED FOR CLOTS PLATELET COUNT CONFIRMED BY SMEAR    Neutrophils Relative %   79 (H) 43 - 77 %   Neutro Abs 3.0 1.7 - 7.7 K/uL   Lymphocytes Relative 18 12 - 46 %   Lymphs Abs 0.7 0.7 - 4.0 K/uL   Monocytes Relative 3 3 - 12 %   Monocytes Absolute 0.1 0.1 - 1.0 K/uL   Eosinophils Relative 0 0 - 5 %   Eosinophils Absolute 0.0 0.0 - 0.7 K/uL   Basophils Relative 0 0 - 1 %   Basophils Absolute 0.0 0.0 - 0.1 K/uL  Basic metabolic panel     Status: Abnormal   Collection Time: 12/26/14  4:34 AM  Result Value Ref Range   Sodium 140 135 - 145 mmol/L    Comment: Please note change in reference range.   Potassium 3.6 3.5 - 5.1 mmol/L    Comment: Please note change in reference range.   Chloride 97 96 - 112 mEq/L   CO2 36 (H) 19 - 32 mmol/L   Glucose, Bld 129 (H) 70 - 99 mg/dL   BUN 23 6 - 23 mg/dL   Creatinine, Ser 0.34 (L) 0.50 - 1.10 mg/dL   Calcium 8.3 (L) 8.4 - 10.5 mg/dL   GFR calc non Af Amer >90 >90 mL/min   GFR calc Af Amer >90 >90 mL/min    Comment: (NOTE) The eGFR has been calculated using the CKD EPI equation. This calculation has not been validated in all clinical situations. eGFR's persistently <90 mL/min signify possible Chronic Kidney Disease.    Anion gap 7 5 - 15  Blood gas, arterial     Status: Abnormal   Collection Time: 12/26/14  5:00 AM  Result Value Ref Range   FIO2 0.40 %   Delivery systems VENTILATOR    Mode PRESSURE REGULATED VOLUME CONTROL    VT 500 mL   Rate 10.0 resp/min   Peep/cpap 5.0 cm H20   pH, Arterial 7.473 (H) 7.350 - 7.450   pCO2 arterial 49.6 (H) 35.0 - 45.0 mmHg   pO2, Arterial 101.0 (H) 80.0 - 100.0 mmHg   Bicarbonate 35.9 (H) 20.0 - 24.0 mEq/L   TCO2 32.7 0 - 100 mmol/L   Acid-Base Excess 11.6 (H) 0.0 - 2.0 mmol/L   O2 Saturation 98.2 %   Patient temperature 37.0    Collection site LEFT RADIAL    Drawn  by [id number]    Sample type ARTERIAL DRAW    Allens test (pass/fail) PASS PASS  Glucose, capillary     Status: Abnormal   Collection Time: 12/26/14  7:50 AM  Result Value Ref Range   Glucose-Capillary 133 (H) 70 - 99 mg/dL   Comment 1 Documented in Chart    Comment 2 Notify RN     ABGS  Recent Labs  12/26/14 0500  PHART 7.473*  PO2ART 101.0*  TCO2 32.7  HCO3 35.9*   CULTURES Recent Results (from the past 240 hour(s))  Culture, blood (routine x 2)     Status: None (Preliminary result)   Collection Time: 12/24/14  3:30 PM  Result Value Ref Range Status   Specimen Description BLOOD RIGHT WRIST DRAWN BY RN [phone]  Final   Special Requests BOTTLES DRAWN AEROBIC AND ANAEROBIC 6CC  Final   Culture NO GROWTH 1 DAY  Final   Report Status PENDING  Incomplete  Culture, blood (routine x 2)     Status: None (Preliminary result)   Collection Time: 12/24/14  3:30 PM  Result Value Ref Range Status   Specimen Description BLOOD LEFT HAND DRAWN BY  RN [id number]  Final   Special Requests BOTTLES DRAWN AEROBIC AND ANAEROBIC 6CC   Final   Culture   Final    GRAM POSITIVE COCCI IN CLUSTERS Note: Gram Stain Report Called to,Read Back By and Verified With: PHILLIPS C. AT 1610 ON 12/25/14 BY BAUGHAM M. Performed at [facility] Performed at [facility]    Report Status PENDING  Incomplete  Urine culture     Status: None   Collection Time: 12/24/14  3:40 PM  Result Value Ref Range Status   Specimen Description URINE, CATHETERIZED  Final   Special Requests NONE  Final   Colony Count NO GROWTH Performed at [facility]   Final   Culture NO GROWTH Performed at [facility]   Final   Report Status 12/25/2014 FINAL  Final  MRSA PCR Screening     Status: Abnormal   Collection Time: 12/24/14  9:20 PM  Result Value Ref Range Status   MRSA by PCR POSITIVE (A) NEGATIVE Final    Comment:        The GeneXpert MRSA Assay (FDA approved for NASAL specimens only), is one  component of a comprehensive MRSA colonization surveillance program. It is not intended to diagnose MRSA infection nor to guide or [place] [address] BY FORSYTH K    Studies/Results: Ct Chest Wo Contrast  12/24/2014   CLINICAL DATA:  Fever, hypoxia  EXAM: CT CHEST WITHOUT CONTRAST  TECHNIQUE: Multidetector CT imaging of the chest was performed following the standard protocol without IV contrast.  COMPARISON:  Plain film from earlier in the same day  FINDINGS: The lungs are with well aerated bilaterally with evidence of vascular congestion and some patchy ground-glass changes likely related to parenchymal edema. Mild lower lobe atelectatic changes are seen. No sizable effusion is noted. The hilar and mediastinal structures show no significant lymphadenopathy. The pulmonary artery is mildly prominent consistent with the vascular congestion.  Scanning into the upper abdomen reveals no acute abnormality. Bony structures show degenerative change of the thoracic spine. Changes consistent with a vertebral hemangioma are noted at T8. At the inferior aspect of L1 on the last image there is a lucency within the L1 vertebral body. This is stable from a prior exam from 03/13/2014 and likely represent a second hemangioma.  IMPRESSION: Vertebral hemangiomas.  Mild vascular congestion and bibasilar atelectatic changes similar to that seen on the prior plain film examination.   Electronically Signed   By: Mark  Lukens M.D.   On: 12/24/2014 18:06   Dg Chest Port 1 View  12/25/2014   CLINICAL DATA:  UTI, respiratory failure, fever, intubation  EXAM: PORTABLE CHEST - 1 VIEW  COMPARISON:  Portable exam 1405 hr compared to 0525 hr  FINDINGS: Tip of endotracheal tube 13 mm from carina.  RIGHT jugular central venous catheter tip projects over RIGHT atrium, consider withdrawal 4 cm for positioning at cavoatrial junction.  Enlargement of cardiac silhouette with pulmonary vascular congestion.   Bibasilar atelectasis and slightly increased mild perihilar edema.  No gross pleural effusion or pneumothorax.  IMPRESSION: Tip of RIGHT jugular central venous catheter projects over RIGHT atrium, recommend withdrawal 4 cm.  Increased pulmonary edema.  Bibasilar atelectasis.  Tip of ET tube has advanced and is now 1.3 cm above carina, consider withdrawal 1-2 cm.  Findings called to Misty RN in ICU on 12/25/2014 at 1430 hr.   Electronically Signed   By: Mark  Boles M.D.   On: 12/25/2014   14:31   Dg Chest Port 1 View  12/25/2014   CLINICAL DATA:  Respiratory failure. Ventilated patient. Morning portable.  EXAM: PORTABLE CHEST - 1 VIEW  COMPARISON:  12/24/2014  FINDINGS: Endotracheal tube tip measures 4.8 cm above the carina. Right central venous catheter projects over the cavoatrial junction. No pneumothorax. Cardiac enlargement. Patchy infiltration or atelectasis in the lung bases. Mild vascular congestion. No definite edema. No blunting of costophrenic angles.  IMPRESSION: Appliances appear in satisfactory position. Cardiac enlargement with mild vascular congestion. Atelectasis in the lung bases. No significant changes since yesterday.   Electronically Signed   By: William  Stevens M.D.   On: 12/25/2014 06:06   Dg Chest Port 1 View  12/24/2014   CLINICAL DATA:  Shortness of breath and poor oxygenation.  EXAM: PORTABLE CHEST - 1 VIEW  COMPARISON:  11/12/2014  FINDINGS: The heart is enlarged but stable. There is central vascular congestion and pulmonary edema with small bilateral pleural effusions consistent with CHF.  IMPRESSION: CHF.   Electronically Signed   By: Mark  Gallerani M.D.   On: 12/24/2014 15:48   Dg Chest Port 1v Same Day  12/24/2014   CLINICAL DATA:  Endotracheal tube and central line placement. Initial encounter.  EXAM: PORTABLE CHEST - 1 VIEW SAME DAY  COMPARISON:  Chest radiograph performed earlier today at 3:38 p.m., and CT of the chest performed earlier today at 6:02 p.m.  FINDINGS: The  patient endotracheal tube is seen ending 2-3 cm above the carina. The right IJ line is noted crossing the midline and extending into the left brachiocephalic vein. This could be retracted 10 cm and readvanced.  The lungs are hypoexpanded. Vascular crowding and vascular congestion noted. Bibasilar airspace opacities could reflect mild interstitial edema. No definite pleural effusion or pneumothorax is seen.  The cardiomediastinal silhouette is borderline enlarged. No acute osseous abnormalities are seen.  IMPRESSION: 1. Endotracheal tube seen ending 2-3 cm above the carina. 2. Right IJ line noted crossing the midline and extending into the left brachiocephalic vein. This could be retracted 10 cm and readvanced. 3. Lungs hypoexpanded. Vascular congestion and borderline cardiomegaly noted. Bibasilar airspace opacities could reflect mild interstitial edema.  These results were called by telephone at the time of interpretation on 12/24/2014 at 8:12 pm to Dr. MARK JAMES, who verbally acknowledged these results.   Electronically Signed   By: Jeffery  Chang M.D.   On: 12/24/2014 20:12    Medications:  Prior to Admission:  Prescriptions prior to admission  Medication Sig Dispense Refill Last Dose  . acetaminophen (TYLENOL) 325 MG tablet Take 2 tablets (650 mg total) by mouth every 6 (six) hours as needed for mild pain (or Fever >/= 101).   unknown  . albuterol (PROVENTIL HFA;VENTOLIN HFA) 108 (90 BASE) MCG/ACT inhaler Inhale 2 puffs into the lungs every 4 (four) hours as needed. Shortness of breath 1 Inhaler 12 unknown  . albuterol (PROVENTIL) (2.5 MG/3ML) 0.083% nebulizer solution Take 2.5 mg by nebulization 4 (four) times daily.   12/24/2014 at 1200  . ALPRAZolam (XANAX) 1 MG tablet Take 1 mg by mouth 4 (four) times daily.    12/24/2014 at 1400  . Amino Acids-Protein Hydrolys (FEEDING SUPPLEMENT, PRO-STAT SUGAR FREE 64,) LIQD Take 30 mLs by mouth 2 (two) times daily with a meal.   12/24/2014 at 800a  . aspirin 325 MG  tablet Take 1 tablet (325 mg total) by mouth daily. 30 tablet 12 12/24/2014 at 900a  . atorvastatin (LIPITOR) 40 MG tablet Take   40 mg by mouth daily.   12/24/2014 at 1000a  . baclofen (LIORESAL) 10 MG tablet Take 5 mg by mouth 3 (three) times daily.   12/24/2014 at 1400  . cefTRIAXone (ROCEPHIN) 1 G injection Inject 1 g into the muscle once. Last administered on 12/23/2014 at 22:00 IM to right hip   12/23/2014  . cholecalciferol (VITAMIN D) 1000 UNITS tablet Take 1,000 Units by mouth daily.   12/24/2014 at 900a  . clopidogrel (PLAVIX) 75 MG tablet Take 1 tablet (75 mg total) by mouth daily with breakfast.   12/24/2014 at 900a  . Cranberry 450 MG CAPS Take 1 capsule by mouth daily.   12/24/2014 at 900a  . guaiFENesin (MUCINEX) 600 MG 12 hr tablet Take 600 mg by mouth 2 (two) times daily.   12/24/2014 at 800a  . haloperidol (HALDOL) 0.5 MG tablet Take 0.5 mg by mouth 2 (two) times daily.    12/24/2014 at 900a  . levothyroxine (SYNTHROID, LEVOTHROID) 88 MCG tablet Take 1 tablet (88 mcg total) by mouth daily before breakfast. 30 tablet 12 12/24/2014 at 600a  . loperamide (IMODIUM) 2 MG capsule Take 1 capsule (2 mg total) by mouth as needed for diarrhea or loose stools. 30 capsule 0 unknown  . loratadine (CLARITIN) 10 MG tablet Take 10 mg by mouth daily.   12/24/2014 at 900a  . nitroGLYCERIN (NITROSTAT) 0.4 MG SL tablet Place 1 tablet (0.4 mg total) under the tongue every 5 (five) minutes as needed for chest pain. 25 tablet 12 unknown  . pantoprazole (PROTONIX) 40 MG tablet Take 1 tablet (40 mg total) by mouth daily. 30 tablet 12 12/24/2014 at 600a  . polyethylene glycol (MIRALAX / GLYCOLAX) packet Take 17 g by mouth daily.   12/24/2014 at 900a  . potassium chloride SA (K-DUR,KLOR-CON) 20 MEQ tablet Take 10 mEq by mouth daily.    12/24/2014 at 900a  . predniSONE (DELTASONE) 5 MG tablet Take 5 mg by mouth daily with breakfast.   12/24/2014 at 900a  . ranolazine (RANEXA) 500 MG 12 hr tablet Take 1 tablet (500 mg total) by mouth 2 (two)  times daily. 60 tablet 3 12/24/2014 at 1000a  . saccharomyces boulardii (FLORASTOR) 250 MG capsule Take 250 mg by mouth 2 (two) times daily.   12/24/2014 at 800a  . sertraline (ZOLOFT) 100 MG tablet Take 1 tablet (100 mg total) by mouth daily.   12/24/2014 at 900a  . temazepam (RESTORIL) 7.5 MG capsule Take 7.5 mg by mouth at bedtime as needed for sleep.   unknown  . traMADol (ULTRAM) 50 MG tablet Take 50 mg by mouth every 6 (six) hours as needed for moderate pain.   unknown  . furosemide (LASIX) 20 MG tablet Take 20 mg by mouth daily.   Taking  . trolamine salicylate (ASPERCREME) 10 % cream Apply topically as needed for muscle pain. (Patient not taking: Reported on 12/24/2014) 85 g 0 Taking   Scheduled: . antiseptic oral rinse  7 mL Mouth Rinse QID  . ceFEPime (MAXIPIME) IV  1 g Intravenous 3 times per day  . chlorhexidine  15 mL Mouth Rinse BID  . Chlorhexidine Gluconate Cloth  6 each Topical Q0600  . enoxaparin (LOVENOX) injection  40 mg Subcutaneous Q24H  . insulin aspart  0-20 Units Subcutaneous 6 times per day  . methylPREDNISolone (SOLU-MEDROL) injection  40 mg Intravenous Q6H  . mupirocin ointment  1 application Nasal BID  . pantoprazole (PROTONIX) IV  40 mg Intravenous Q24H  . sodium chloride    10-40 mL Intracatheter Q12H  . vancomycin  1,250 mg Intravenous Q12H   Continuous: . sodium chloride 75 mL/hr at 12/26/14 0700  . propofol 55 mcg/kg/min (12/26/14 0700)   PRN:sodium chloride, acetaminophen, fentaNYL, ondansetron (ZOFRAN) IV, sodium chloride  Assesment: She has acute on chronic respiratory failure and is intubated and on mechanical ventilation. She was septic on admission. She seems to have a urinary tract infection. Chest x-ray today is pending. Her chest x-ray has suggested that she might have some volume overload but clinically that does not appear to be the case. BNP is pending Active Problems:   Morbid obesity   Cerebral palsy   Hypertension   Sepsis   Acute and chronic  respiratory failure with hypercapnia   Encephalopathy, metabolic   UTI (urinary tract infection)    Plan: Continue current treatments. Because of the concerns for pain I have added fentanyl.    LOS: 2 days   , L 12/26/2014, 7:59 AM   

## 2014-12-27 ENCOUNTER — Inpatient Hospital Stay (HOSPITAL_COMMUNITY): Payer: Medicare Other

## 2014-12-27 LAB — BLOOD GAS, ARTERIAL
ACID-BASE EXCESS: 7.8 mmol/L — AB (ref 0.0–2.0)
Bicarbonate: 32.2 mEq/L — ABNORMAL HIGH (ref 20.0–24.0)
DRAWN BY: 270271
FIO2: 0.4 %
MECHVT: 500 mL
O2 Saturation: 98.5 %
PCO2 ART: 48.7 mmHg — AB (ref 35.0–45.0)
PEEP: 5 cmH2O
PH ART: 7.436 (ref 7.350–7.450)
Patient temperature: 37
RATE: 10 resp/min
TCO2: 29.4 mmol/L (ref 0–100)
pO2, Arterial: 126 mmHg — ABNORMAL HIGH (ref 80.0–100.0)

## 2014-12-27 LAB — CBC
HCT: 34.2 % — ABNORMAL LOW (ref 36.0–46.0)
Hemoglobin: 10.3 g/dL — ABNORMAL LOW (ref 12.0–15.0)
MCH: 27 pg (ref 26.0–34.0)
MCHC: 30.1 g/dL (ref 30.0–36.0)
MCV: 89.8 fL (ref 78.0–100.0)
Platelets: 107 10*3/uL — ABNORMAL LOW (ref 150–400)
RBC: 3.81 MIL/uL — AB (ref 3.87–5.11)
RDW: 16.8 % — ABNORMAL HIGH (ref 11.5–15.5)
WBC: 4.4 10*3/uL (ref 4.0–10.5)

## 2014-12-27 LAB — BASIC METABOLIC PANEL
Anion gap: 6 (ref 5–15)
BUN: 20 mg/dL (ref 6–23)
CO2: 35 mmol/L — AB (ref 19–32)
Calcium: 8.4 mg/dL (ref 8.4–10.5)
Chloride: 99 mEq/L (ref 96–112)
Creatinine, Ser: 0.32 mg/dL — ABNORMAL LOW (ref 0.50–1.10)
GFR calc Af Amer: >90 mL/min (ref >90)
GFR calc non Af Amer: >90 mL/min (ref >90)
GLUCOSE: 111 mg/dL — AB (ref 70–99)
Potassium: 3.3 mmol/L — ABNORMAL LOW (ref 3.5–5.1)
Sodium: 140 mmol/L (ref 135–145)

## 2014-12-27 LAB — GLUCOSE, CAPILLARY
GLUCOSE-CAPILLARY: 110 mg/dL — AB (ref 70–99)
GLUCOSE-CAPILLARY: 100 mg/dL — AB (ref 70–99)
Glucose-Capillary: 101 mg/dL — ABNORMAL HIGH (ref 70–99)
Glucose-Capillary: 104 mg/dL — ABNORMAL HIGH (ref 70–99)
Glucose-Capillary: 105 mg/dL — ABNORMAL HIGH (ref 70–99)
Glucose-Capillary: 130 mg/dL — ABNORMAL HIGH (ref 70–99)

## 2014-12-27 LAB — CULTURE, BLOOD (ROUTINE X 2)

## 2014-12-27 MED ORDER — MIDAZOLAM HCL 2 MG/2ML IJ SOLN
INTRAMUSCULAR | Status: AC
  Administered 2014-12-27: 1 mg via INTRAVENOUS
  Filled 2014-12-27: qty 2

## 2014-12-27 MED ORDER — ALTEPLASE 100 MG IV SOLR
2.0000 mg | Freq: Once | INTRAVENOUS | Status: AC
  Administered 2014-12-27: 2 mg
  Filled 2014-12-27: qty 2

## 2014-12-27 MED ORDER — MIDAZOLAM HCL 2 MG/2ML IJ SOLN
1.0000 mg | INTRAMUSCULAR | Status: DC | PRN
  Administered 2014-12-27 – 2014-12-28 (×5): 1 mg via INTRAVENOUS
  Filled 2014-12-27 (×4): qty 2

## 2014-12-27 MED ORDER — ALTEPLASE 2 MG IJ SOLR
INTRAMUSCULAR | Status: AC
  Filled 2014-12-27: qty 2

## 2014-12-27 MED ORDER — POTASSIUM CHLORIDE 10 MEQ/100ML IV SOLN
10.0000 meq | INTRAVENOUS | Status: AC
  Administered 2014-12-27 (×4): 10 meq via INTRAVENOUS
  Filled 2014-12-27 (×4): qty 100

## 2014-12-27 MED ORDER — FENTANYL CITRATE 0.05 MG/ML IJ SOLN
INTRAMUSCULAR | Status: AC
  Filled 2014-12-27: qty 50

## 2014-12-27 NOTE — Progress Notes (Addendum)
Subjective: She remains intubated and on the ventilator. She is very agitated again. Her different and had to be decreased because of her heart rate. She is on fentanyl. She has been on Xanax at the nursing home and has not been receiving any benzodiazepines .  Objective: Vital signs in last 24 hours: Temp:  [97.4 F (36.3 C)-98.5 F (36.9 C)] 97.4 F (36.3 C) (01/08 0750) Pulse Rate:  [44-138] 49 (01/08 0500) Resp:  [10-22] 16 (01/08 0500) BP: (114-159)/(53-93) 134/69 mmHg (01/08 0500) SpO2:  [86 %-100 %] 98 % (01/08 0500) FiO2 (%):  [40 %] 40 % (01/08 0417) Weight:  [97.4 kg (214 lb 11.7 oz)] 97.4 kg (214 lb 11.7 oz) (01/08 0500) Weight change: 0.4 kg (14.1 oz) Last BM Date: 12/23/14  Intake/Output from previous day: 01/07 0701 - 01/08 0700 In: 2933.8 [I.V.:2283.8; IV Piggyback:650] Out: 1875 [MOQHU:7654]  PHYSICAL EXAM General appearance: alert and Very agitated. Intubated and on mechanical ventilation Resp: rhonchi bilaterally Cardio: regular rate and rhythm, S1, S2 normal, no murmur, click, rub or gallop GI: soft, non-tender; bowel sounds normal; no masses,  no organomegaly Extremities: extremities normal, atraumatic, no cyanosis or edema  Lab Results:  Results for orders placed or performed during the hospital encounter of 12/24/14 (from the past 48 hour(s))  Glucose, capillary     Status: Abnormal   Collection Time: 12/25/14  8:49 AM  Result Value Ref Range   Glucose-Capillary 110 (H) 70 - 99 mg/dL   Comment 1 Documented in Chart    Comment 2 Notify RN   Blood gas, arterial     Status: Abnormal   Collection Time: 12/25/14 10:13 AM  Result Value Ref Range   FIO2 50.00 %   Delivery systems VENTILATOR    Mode PRESSURE REGULATED VOLUME CONTROL    VT 500 mL   Rate 10.0 resp/min   Peep/cpap 5.0 cm H20   pH, Arterial 7.525 (H) 7.350 - 7.450   pCO2 arterial 50.5 (H) 35.0 - 45.0 mmHg   pO2, Arterial 110.0 (H) 80.0 - 100.0 mmHg   Bicarbonate 41.5 (H) 20.0 - 24.0 mEq/L    TCO2 36.9 0 - 100 mmol/L   Acid-Base Excess 17.1 (H) 0.0 - 2.0 mmol/L   O2 Saturation 98.7 %   Patient temperature 37.0    Collection site RIGHT RADIAL    Drawn by COLLECTED BY RT    Sample type ARTERIAL    Allens test (pass/fail) PASS PASS  Glucose, capillary     Status: Abnormal   Collection Time: 12/25/14 11:30 AM  Result Value Ref Range   Glucose-Capillary 110 (H) 70 - 99 mg/dL   Comment 1 Documented in Chart    Comment 2 Notify RN   Potassium     Status: None   Collection Time: 12/25/14  3:32 PM  Result Value Ref Range   Potassium 3.8 3.5 - 5.1 mmol/L    Comment: Please note change in reference range. DELTA CHECK NOTED   Glucose, capillary     Status: Abnormal   Collection Time: 12/25/14  5:17 PM  Result Value Ref Range   Glucose-Capillary 148 (H) 70 - 99 mg/dL   Comment 1 Documented in Chart    Comment 2 Notify RN   Glucose, capillary     Status: Abnormal   Collection Time: 12/25/14  8:19 PM  Result Value Ref Range   Glucose-Capillary 127 (H) 70 - 99 mg/dL  Glucose, capillary     Status: Abnormal   Collection Time: 12/25/14  11:50 PM  Result Value Ref Range   Glucose-Capillary 124 (H) 70 - 99 mg/dL  Glucose, capillary     Status: Abnormal   Collection Time: 12/26/14  3:38 AM  Result Value Ref Range   Glucose-Capillary 116 (H) 70 - 99 mg/dL  Triglycerides     Status: None   Collection Time: 12/26/14  4:34 AM  Result Value Ref Range   Triglycerides 137 <150 mg/dL  CBC with Differential     Status: Abnormal   Collection Time: 12/26/14  4:34 AM  Result Value Ref Range   WBC 3.8 (L) 4.0 - 10.5 K/uL   RBC 3.83 (L) 3.87 - 5.11 MIL/uL   Hemoglobin 10.4 (L) 12.0 - 15.0 g/dL   HCT 34.7 (L) 36.0 - 46.0 %   MCV 90.6 78.0 - 100.0 fL    Comment: DELTA CHECK NOTED RESULT REPEATED AND VERIFIED    MCH 27.2 26.0 - 34.0 pg   MCHC 30.0 30.0 - 36.0 g/dL   RDW 16.5 (H) 11.5 - 15.5 %   Platelets 96 (L) 150 - 400 K/uL    Comment: SPECIMEN CHECKED FOR CLOTS PLATELET COUNT  CONFIRMED BY SMEAR    Neutrophils Relative % 79 (H) 43 - 77 %   Neutro Abs 3.0 1.7 - 7.7 K/uL   Lymphocytes Relative 18 12 - 46 %   Lymphs Abs 0.7 0.7 - 4.0 K/uL   Monocytes Relative 3 3 - 12 %   Monocytes Absolute 0.1 0.1 - 1.0 K/uL   Eosinophils Relative 0 0 - 5 %   Eosinophils Absolute 0.0 0.0 - 0.7 K/uL   Basophils Relative 0 0 - 1 %   Basophils Absolute 0.0 0.0 - 0.1 K/uL  Basic metabolic panel     Status: Abnormal   Collection Time: 12/26/14  4:34 AM  Result Value Ref Range   Sodium 140 135 - 145 mmol/L    Comment: Please note change in reference range.   Potassium 3.6 3.5 - 5.1 mmol/L    Comment: Please note change in reference range.   Chloride 97 96 - 112 mEq/L   CO2 36 (H) 19 - 32 mmol/L   Glucose, Bld 129 (H) 70 - 99 mg/dL   BUN 23 6 - 23 mg/dL   Creatinine, Ser 0.34 (L) 0.50 - 1.10 mg/dL   Calcium 8.3 (L) 8.4 - 10.5 mg/dL   GFR calc non Af Amer >90 >90 mL/min   GFR calc Af Amer >90 >90 mL/min    Comment: (NOTE) The eGFR has been calculated using the CKD EPI equation. This calculation has not been validated in all clinical situations. eGFR's persistently <90 mL/min signify possible Chronic Kidney Disease.    Anion gap 7 5 - 15  Blood gas, arterial     Status: Abnormal   Collection Time: 12/26/14  5:00 AM  Result Value Ref Range   FIO2 0.40 %   Delivery systems VENTILATOR    Mode PRESSURE REGULATED VOLUME CONTROL    VT 500 mL   Rate 10.0 resp/min   Peep/cpap 5.0 cm H20   pH, Arterial 7.473 (H) 7.350 - 7.450   pCO2 arterial 49.6 (H) 35.0 - 45.0 mmHg   pO2, Arterial 101.0 (H) 80.0 - 100.0 mmHg   Bicarbonate 35.9 (H) 20.0 - 24.0 mEq/L   TCO2 32.7 0 - 100 mmol/L   Acid-Base Excess 11.6 (H) 0.0 - 2.0 mmol/L   O2 Saturation 98.2 %   Patient temperature 37.0    Collection site LEFT  RADIAL    Drawn by [id number]    Sample type ARTERIAL DRAW    Allens test (pass/fail) PASS PASS  Glucose, capillary     Status: Abnormal   Collection Time: 12/26/14  7:50 AM  Result  Value Ref Range   Glucose-Capillary 133 (H) 70 - 99 mg/dL   Comment 1 Documented in Chart    Comment 2 Notify RN   Brain natriuretic peptide     Status: Abnormal   Collection Time: 12/26/14  9:16 AM  Result Value Ref Range   B Natriuretic Peptide 107.0 (H) 0.0 - 100.0 pg/mL    Comment: Please note change in reference range.  Glucose, capillary     Status: Abnormal   Collection Time: 12/26/14 11:08 AM  Result Value Ref Range   Glucose-Capillary 110 (H) 70 - 99 mg/dL  Glucose, capillary     Status: Abnormal   Collection Time: 12/26/14  4:56 PM  Result Value Ref Range   Glucose-Capillary 134 (H) 70 - 99 mg/dL   Comment 1 Documented in Chart    Comment 2 Notify RN   Glucose, capillary     Status: None   Collection Time: 12/26/14  7:58 PM  Result Value Ref Range   Glucose-Capillary 97 70 - 99 mg/dL  Glucose, capillary     Status: Abnormal   Collection Time: 12/26/14 11:57 PM  Result Value Ref Range   Glucose-Capillary 130 (H) 70 - 99 mg/dL  Glucose, capillary     Status: Abnormal   Collection Time: 12/27/14  3:49 AM  Result Value Ref Range   Glucose-Capillary 110 (H) 70 - 99 mg/dL  Blood gas, arterial     Status: Abnormal   Collection Time: 12/27/14  4:07 AM  Result Value Ref Range   FIO2 0.40 %   Delivery systems VENTILATOR    Mode PRESSURE REGULATED VOLUME CONTROL    VT 500 mL   Rate 10 resp/min   Peep/cpap 5.0 cm H20   pH, Arterial 7.436 7.350 - 7.450   pCO2 arterial 48.7 (H) 35.0 - 45.0 mmHg   pO2, Arterial 126.0 (H) 80.0 - 100.0 mmHg   Bicarbonate 32.2 (H) 20.0 - 24.0 mEq/L   TCO2 29.4 0 - 100 mmol/L   Acid-Base Excess 7.8 (H) 0.0 - 2.0 mmol/L   O2 Saturation 98.5 %   Patient temperature 37.0    Collection site LEFT RADIAL    Drawn by [id number]    Sample type ARTERIAL DRAW    Allens test (pass/fail) PASS PASS  Basic metabolic panel     Status: Abnormal   Collection Time: 12/27/14  4:25 AM  Result Value Ref Range   Sodium 140 135 - 145 mmol/L    Comment: Please  note change in reference range.   Potassium 3.3 (L) 3.5 - 5.1 mmol/L    Comment: Please note change in reference range.   Chloride 99 96 - 112 mEq/L   CO2 35 (H) 19 - 32 mmol/L   Glucose, Bld 111 (H) 70 - 99 mg/dL   BUN 20 6 - 23 mg/dL   Creatinine, Ser 0.32 (L) 0.50 - 1.10 mg/dL   Calcium 8.4 8.4 - 10.5 mg/dL   GFR calc non Af Amer >90 >90 mL/min   GFR calc Af Amer >90 >90 mL/min    Comment: (NOTE) The eGFR has been calculated using the CKD EPI equation. This calculation has not been validated in all clinical situations. eGFR's persistently <90 mL/min signify possible Chronic Kidney Disease.  Anion gap 6 5 - 15  CBC     Status: Abnormal   Collection Time: 12/27/14  4:25 AM  Result Value Ref Range   WBC 4.4 4.0 - 10.5 K/uL   RBC 3.81 (L) 3.87 - 5.11 MIL/uL   Hemoglobin 10.3 (L) 12.0 - 15.0 g/dL   HCT 34.2 (L) 36.0 - 46.0 %   MCV 89.8 78.0 - 100.0 fL   MCH 27.0 26.0 - 34.0 pg   MCHC 30.1 30.0 - 36.0 g/dL   RDW 16.8 (H) 11.5 - 15.5 %   Platelets 107 (L) 150 - 400 K/uL    Comment: SPECIMEN CHECKED FOR CLOTS CONSISTENT WITH PREVIOUS RESULT     ABGS  Recent Labs  12/27/14 0407  PHART 7.436  PO2ART 126.0*  TCO2 29.4  HCO3 32.2*   CULTURES Recent Results (from the past 240 hour(s))  Culture, blood (routine x 2)     Status: None (Preliminary result)   Collection Time: 12/24/14  3:30 PM  Result Value Ref Range Status   Specimen Description BLOOD RIGHT WRIST DRAWN BY RN [id number]  Final   Special Requests BOTTLES DRAWN AEROBIC AND ANAEROBIC 6CC  Final   Culture NO GROWTH 3 DAYS  Final   Report Status PENDING  Incomplete  Culture, blood (routine x 2)     Status: None (Preliminary result)   Collection Time: 12/24/14  3:30 PM  Result Value Ref Range Status   Specimen Description BLOOD LEFT HAND DRAWN BY RN [phone]  Final   Special Requests BOTTLES DRAWN AEROBIC AND ANAEROBIC 6CC   Final   Culture   Final    GRAM POSITIVE COCCI IN CLUSTERS Note: Gram Stain Report Called  to,Read Back By and Verified With: PHILLIPS C. AT 1610 ON 12/25/14 BY BAUGHAM M. Performed at [facility] Performed at [facility]    Report Status PENDING  Incomplete  Urine culture     Status: None   Collection Time: 12/24/14  3:40 PM  Result Value Ref Range Status   Specimen Description URINE, CATHETERIZED  Final   Special Requests NONE  Final   Colony Count NO GROWTH Performed at [facility]   Final   Culture NO GROWTH Performed at [facility]   Final   Report Status 12/25/2014 FINAL  Final  MRSA PCR Screening     Status: Abnormal   Collection Time: 12/24/14  9:20 PM  Result Value Ref Range Status   MRSA by PCR POSITIVE (A) NEGATIVE Final    Comment:        The GeneXpert MRSA Assay (FDA approved for NASAL specimens only), is one component of a comprehensive MRSA colonization surveillance program. It is not intended to diagnose MRSA infection nor to guide or monitor [place] [address] BY Mikel Cella K    Studies/Results: Dg Chest Port 1 View  12/27/2014   CLINICAL DATA:  Respiratory failure.  EXAM: PORTABLE CHEST - 1 VIEW  COMPARISON:  12/26/2014.  FINDINGS: Endotracheal tube and right PICC line in stable position. Mediastinal and hilar structures unremarkable. Mild persistent bibasilar atelectasis and/or infiltrates. Stable cardiomegaly. Small left pleural effusion cannot be excluded. No pneumothorax. No acute osseus abnormality .  IMPRESSION: 1. Lines and tubes in stable position. 2. Mild persistent bibasilar atelectasis and/or infiltrates.   Electronically Signed   By: Marcello Moores  Register   On: 12/27/2014 07:46   Dg Chest Port 1 View  12/26/2014   CLINICAL DATA:  Respiratory failure  EXAM:  PORTABLE CHEST - 1 VIEW  COMPARISON:  12/25/2014  FINDINGS: Cardiac shadow is stable. An endotracheal tube is again seen approximately 3.5 cm above the carina. A right jugular line it has been removed in the interval. A right-sided PICC line  is now noted with the tip at the cavoatrial junction. No focal infiltrate is seen. Very minimal right basilar atelectasis is noted.  IMPRESSION: Stable right basilar atelectasis.  Interval removal of right jugular central line.  New right-sided PICC line in satisfactory position.   Electronically Signed   By: Inez Catalina M.D.   On: 12/26/2014 16:05   Dg Chest Port 1 View  12/25/2014   CLINICAL DATA:  UTI, respiratory failure, fever, intubation  EXAM: PORTABLE CHEST - 1 VIEW  COMPARISON:  Portable exam 1405 hr compared to 0525 hr  FINDINGS: Tip of endotracheal tube 13 mm from carina.  RIGHT jugular central venous catheter tip projects over RIGHT atrium, consider withdrawal 4 cm for positioning at cavoatrial junction.  Enlargement of cardiac silhouette with pulmonary vascular congestion.  Bibasilar atelectasis and slightly increased mild perihilar edema.  No gross pleural effusion or pneumothorax.  IMPRESSION: Tip of RIGHT jugular central venous catheter projects over RIGHT atrium, recommend withdrawal 4 cm.  Increased pulmonary edema.  Bibasilar atelectasis.  Tip of ET tube has advanced and is now 1.3 cm above carina, consider withdrawal 1-2 cm.  Findings called to [facility] RN in ICU on 12/25/2014 at 1430 hr.   Electronically Signed   By: Lavonia Dana M.D.   On: 12/25/2014 14:31    Medications:  Prior to Admission:  Prescriptions prior to admission  Medication Sig Dispense Refill Last Dose  . acetaminophen (TYLENOL) 325 MG tablet Take 2 tablets (650 mg total) by mouth every 6 (six) hours as needed for mild pain (or Fever >/= 101).   unknown  . albuterol (PROVENTIL HFA;VENTOLIN HFA) 108 (90 BASE) MCG/ACT inhaler Inhale 2 puffs into the lungs every 4 (four) hours as needed. Shortness of breath 1 Inhaler 12 unknown  . albuterol (PROVENTIL) (2.5 MG/3ML) 0.083% nebulizer solution Take 2.5 mg by nebulization 4 (four) times daily.   12/24/2014 at 1200  . ALPRAZolam (XANAX) 1 MG tablet Take 1 mg by mouth 4 (four)  times daily.    12/24/2014 at 1400  . Amino Acids-Protein Hydrolys (FEEDING SUPPLEMENT, PRO-STAT SUGAR FREE 64,) LIQD Take 30 mLs by mouth 2 (two) times daily with a meal.   12/24/2014 at 800a  . aspirin 325 MG tablet Take 1 tablet (325 mg total) by mouth daily. 30 tablet 12 12/24/2014 at [place]  . atorvastatin (LIPITOR) 40 MG tablet Take 40 mg by mouth daily.   12/24/2014 at [place]  . baclofen (LIORESAL) 10 MG tablet Take 5 mg by mouth 3 (three) times daily.   12/24/2014 at 1400  . cefTRIAXone (ROCEPHIN) 1 G injection Inject 1 g into the muscle once. Last administered on 12/23/2014 at 22:00 IM to right hip   12/23/2014  . cholecalciferol (VITAMIN D) 1000 UNITS tablet Take 1,000 Units by mouth daily.   12/24/2014 at [place]  . clopidogrel (PLAVIX) 75 MG tablet Take 1 tablet (75 mg total) by mouth daily with breakfast.   12/24/2014 at [place]  . Cranberry 450 MG CAPS Take 1 capsule by mouth daily.   12/24/2014 at 900a  . guaiFENesin (MUCINEX) 600 MG 12 hr tablet Take 600 mg by mouth 2 (two) times daily.   12/24/2014 at 800a  . haloperidol (HALDOL) 0.5 MG tablet Take 0.5  mg by mouth 2 (two) times daily.    12/24/2014 at 900a  . levothyroxine (SYNTHROID, LEVOTHROID) 88 MCG tablet Take 1 tablet (88 mcg total) by mouth daily before breakfast. 30 tablet 12 12/24/2014 at 600a  . loperamide (IMODIUM) 2 MG capsule Take 1 capsule (2 mg total) by mouth as needed for diarrhea or loose stools. 30 capsule 0 unknown  . loratadine (CLARITIN) 10 MG tablet Take 10 mg by mouth daily.   12/24/2014 at [place]  . nitroGLYCERIN (NITROSTAT) 0.4 MG SL tablet Place 1 tablet (0.4 mg total) under the tongue every 5 (five) minutes as needed for chest pain. 25 tablet 12 unknown  . pantoprazole (PROTONIX) 40 MG tablet Take 1 tablet (40 mg total) by mouth daily. 30 tablet 12 12/24/2014 at 600a  . polyethylene glycol (MIRALAX / GLYCOLAX) packet Take 17 g by mouth daily.   12/24/2014 at 900a  . potassium chloride SA (K-DUR,KLOR-CON) 20 MEQ tablet Take 10 mEq by mouth daily.     12/24/2014 at [place]  . predniSONE (DELTASONE) 5 MG tablet Take 5 mg by mouth daily with breakfast.   12/24/2014 at [place]  . ranolazine (RANEXA) 500 MG 12 hr tablet Take 1 tablet (500 mg total) by mouth 2 (two) times daily. 60 tablet 3 12/24/2014 at [place]  . saccharomyces boulardii (FLORASTOR) 250 MG capsule Take 250 mg by mouth 2 (two) times daily.   12/24/2014 at 800a  . sertraline (ZOLOFT) 100 MG tablet Take 1 tablet (100 mg total) by mouth daily.   12/24/2014 at 900a  . temazepam (RESTORIL) 7.5 MG capsule Take 7.5 mg by mouth at bedtime as needed for sleep.   unknown  . traMADol (ULTRAM) 50 MG tablet Take 50 mg by mouth every 6 (six) hours as needed for moderate pain.   unknown  . furosemide (LASIX) 20 MG tablet Take 20 mg by mouth daily.   Taking  . trolamine salicylate (ASPERCREME) 10 % cream Apply topically as needed for muscle pain. (Patient not taking: Reported on 12/24/2014) 85 g 0 Taking   Scheduled: . antiseptic oral rinse  7 mL Mouth Rinse QID  . ceFEPime (MAXIPIME) IV  1 g Intravenous 3 times per day  . chlorhexidine  15 mL Mouth Rinse BID  . Chlorhexidine Gluconate Cloth  6 each Topical Q0600  . enoxaparin (LOVENOX) injection  40 mg Subcutaneous Q24H  . insulin aspart  0-20 Units Subcutaneous 6 times per day  . methylPREDNISolone (SOLU-MEDROL) injection  40 mg Intravenous Q6H  . midazolam      . mupirocin ointment  1 application Nasal BID  . pantoprazole (PROTONIX) IV  40 mg Intravenous Q24H  . sodium chloride  10-40 mL Intracatheter Q12H  . vancomycin  1,250 mg Intravenous Q12H   Continuous: . sodium chloride 75 mL/hr at 12/27/14 0520  . fentaNYL infusion INTRAVENOUS 100 mcg/hr (12/27/14 0750)  . propofol 50 mcg/kg/min (12/27/14 0745)   KGY:JEHUDJ chloride, acetaminophen, fentaNYL, ondansetron (ZOFRAN) IV, sodium chloride  Assesment: She was admitted with acute on chronic respiratory failure with hypercapnia and sepsis presumably from urinary tract infection. She has cerebral palsy  at baseline and generally has some choreoathetoid movements. She is more agitated now. She has metabolic encephalopathy probably from her UTI and sepsis. She has been on benzodiazepines and may be having some trouble with withdrawal so that will be added to see if it makes a difference. I would like to see if she is doing well enough from a respiratory point of view to  get her off the ventilator but she is very difficult to assess because she is so agitated. Active Problems:   Morbid obesity   Cerebral palsy   Hypertension   Sepsis   Acute and chronic respiratory failure with hypercapnia   Encephalopathy, metabolic   UTI (urinary tract infection)    Plan: Add Versed continue other treatments. Try some attempt today to see if she is ready for extubation. Her potassium will be replaced. We need to start nutritional support but we have been unable to place OG tube    LOS: 3 days   Hershel Corkery L 12/27/2014, 8:12 AM

## 2014-12-27 NOTE — Progress Notes (Signed)
Attempted to wean patient twice during day shift. She was on PS/CPAP 10/5 with 40% for approximately 45 minutes before she tired (per ventilator"lack of patient effort') and was returned to rest mode of PRVC10/500/40%/PEEP5. The second attempt, patient became too anxious to continue weaning because had inadvertently  disconnected the IV with her sedation. Patient gets agitated with any touch but also with moving ETT and does not tolerate it being place at the center of her upper lip.

## 2014-12-27 NOTE — Progress Notes (Signed)
Cathflo Activase withdrawn  From white port with some difficulty, line flushed with 10 cc's NS, withdrew another 8 cc's of blood and flushed with another 10 cc's of NS. Primary IVF changed to white port and grey port flushed with 10 cc's of NS. Instructed nurse to monitor and to flush well whenever used.

## 2014-12-27 NOTE — Clinical Social Work Note (Signed)
CSW spoke with Jackelyn Poling at [facility].  Debbie advised that patient's family had placed a bed hold on her bed in the facility.   Ambrose Pancoast, [place]

## 2014-12-27 NOTE — Progress Notes (Signed)
[facility] for Vancomycin & Cefepime Indication: urosepsis  Allergies  Allergen Reactions  . Latex     Provided via MAR  . Naproxen Other (See Comments)    Unknown-Provided via MAR    Patient Measurements: Height: 5\' 6"  (167.6 cm) Weight: 214 lb 11.7 oz (97.4 kg) IBW/kg (Calculated) : 59.3  Vital Signs: Temp: 98.6 F (37 C) (01/08 1208) Temp Source: Axillary (01/08 1208) BP: 126/68 mmHg (01/08 1115) Pulse Rate: 36 (01/08 1115) Intake/Output from previous day: 01/07 0701 - 01/08 0700 In: 2933.8 [I.V.:2283.8; IV Piggyback:650] Out: 1875 [OFBPZ:0258] Intake/Output from this shift: Total I/O In: -  Out: 300 [Urine:300]  Labs:  Recent Labs  12/25/14 0657 12/26/14 0434 12/27/14 0425  WBC 5.3 3.8* 4.4  HGB 10.5* 10.4* 10.3*  PLT 97* 96* 107*  CREATININE 0.41* 0.34* 0.32*   Estimated Creatinine Clearance: 73.7 mL/min (by C-G formula based on Cr of 0.32). No results for input(s): VANCOTROUGH, VANCOPEAK, VANCORANDOM, GENTTROUGH, GENTPEAK, GENTRANDOM, TOBRATROUGH, TOBRAPEAK, TOBRARND, AMIKACINPEAK, AMIKACINTROU, AMIKACIN in the last 72 hours.   Microbiology: Recent Results (from the past 720 hour(s))  Culture, blood (routine x 2)     Status: None (Preliminary result)   Collection Time: 12/24/14  3:30 PM  Result Value Ref Range Status   Specimen Description BLOOD RIGHT WRIST DRAWN BY RN [phone]  Final   Special Requests BOTTLES DRAWN AEROBIC AND ANAEROBIC 6CC  Final   Culture NO GROWTH 3 DAYS  Final   Report Status PENDING  Incomplete  Culture, blood (routine x 2)     Status: None   Collection Time: 12/24/14  3:30 PM  Result Value Ref Range Status   Specimen Description BLOOD LEFT HAND DRAWN BY RN [phone]  Final   Special Requests BOTTLES DRAWN AEROBIC AND ANAEROBIC 6CC   Final   Culture   Final    STAPHYLOCOCCUS SPECIES (COAGULASE NEGATIVE) Note: THE SIGNIFICANCE OF ISOLATING THIS ORGANISM FROM A SINGLE VENIPUNCTURE CANNOT BE PREDICTED  WITHOUT FURTHER CLINICAL AND CULTURE CORRELATION. SUSCEPTIBILITIES AVAILABLE ONLY ON REQUEST. Note: Gram Stain Report Called to,Read Back By and Verified With: PHILLIPS C. AT 1610 ON 12/25/14 BY BAUGHAM M. Performed at [facility] Performed at [facility]    Report Status 12/27/2014 FINAL  Final  Urine culture     Status: None   Collection Time: 12/24/14  3:40 PM  Result Value Ref Range Status   Specimen Description URINE, CATHETERIZED  Final   Special Requests NONE  Final   Colony Count NO GROWTH Performed at [facility]   Final   Culture NO GROWTH Performed at [facility]   Final   Report Status 12/25/2014 FINAL  Final  MRSA PCR Screening     Status: Abnormal   Collection Time: 12/24/14  9:20 PM  Result Value Ref Range Status   MRSA by PCR POSITIVE (A) NEGATIVE Final    Comment:        The GeneXpert MRSA Assay (FDA approved for NASAL specimens only), is one component of a comprehensive MRSA colonization surveillance program. It is not intended to diagnose MRSA infection nor to guide [place] [address] BY FORSYTH K     Anti-infectives    Start     Dose/Rate Route Frequency Ordered Stop   12/25/14 0000  vancomycin (VANCOCIN) 1,250 mg in sodium chloride 0.9 % 250 mL IVPB     1,250 mg166.7 mL/hr over 90 Minutes Intravenous Every 12 hours 12/24/14  2017     12/24/14 2200  ceFEPIme (MAXIPIME) 1 g in dextrose 5 % 50 mL IVPB     1 g100 mL/hr over 30 Minutes Intravenous 3 times per day 12/24/14 2017     12/24/14 1615  vancomycin (VANCOCIN) IVPB 1000 mg/200 mL premix     1,000 mg200 mL/hr over 60 Minutes Intravenous  Once 12/24/14 1606 12/24/14 1750   12/24/14 1530  ceFEPIme (MAXIPIME) 2 g in dextrose 5 % 50 mL IVPB     2 g100 mL/hr over 30 Minutes Intravenous  Once 12/24/14 1516 12/24/14 1635      Assessment: 74 yo F with hx cerebral palsy who is wheelchair bound & resides at SNF.  She received IM dose of  Rocephin at NH prior to admit, however continued to decline.  She was brought to ED with lethargy & fever.  She remians intubated.   She is currently afebrile with normal WBC and lactic acid level.  CXR did not reveal PNA. She has hx of Acinetobacter in urine that was resistant to cephalosporins (05/2014).   Blood cx 1/2 CNS (likely contaminant), urine cx negative, but was on Rocephin PTA.  Vancomycin 1/5>> Cefepime 1/5>>  Goal of Therapy:  Vancomycin trough level 15-20 mcg/ml  Plan:  Cefepime 1gm IV q8h Vancomycin 1250mg  IV q12h Check Vancomycin trough at steady state -will order for 1/9 Monitor renal function and cx data  Duration of therapy per MD -consider d/c Vancomycin since cx data negative.  Continue Cefepime or Rocephin to complete course for UTI.  Biagio Borg 12/27/2014,12:33 PM

## 2014-12-27 NOTE — Progress Notes (Signed)
Cathflo activase 2 mg  instilled per policy to white capped port of PICC line. Will reassess in approximately 45 minutes to 1 hour to determine patency.

## 2014-12-28 ENCOUNTER — Inpatient Hospital Stay (HOSPITAL_COMMUNITY): Payer: Medicare Other

## 2014-12-28 LAB — GLUCOSE, CAPILLARY
GLUCOSE-CAPILLARY: 106 mg/dL — AB (ref 70–99)
GLUCOSE-CAPILLARY: 117 mg/dL — AB (ref 70–99)
Glucose-Capillary: 108 mg/dL — ABNORMAL HIGH (ref 70–99)
Glucose-Capillary: 114 mg/dL — ABNORMAL HIGH (ref 70–99)
Glucose-Capillary: 99 mg/dL (ref 70–99)
Glucose-Capillary: 92 mg/dL (ref 70–99)

## 2014-12-28 LAB — BLOOD GAS, ARTERIAL
Acid-Base Excess: 5.2 mmol/L — ABNORMAL HIGH (ref 0.0–2.0)
BICARBONATE: 29.6 meq/L — AB (ref 20.0–24.0)
DRAWN BY: 105551
FIO2: 0.35 %
MECHVT: 500 mL
O2 SAT: 97.9 %
PEEP: 5 cmH2O
Patient temperature: 37
RATE: 10 resp/min
TCO2: 27.1 mmol/L (ref 0–100)
pCO2 arterial: 47.7 mmHg — ABNORMAL HIGH (ref 35.0–45.0)
pH, Arterial: 7.41 (ref 7.350–7.450)
pO2, Arterial: 111 mmHg — ABNORMAL HIGH (ref 80.0–100.0)

## 2014-12-28 LAB — COMPREHENSIVE METABOLIC PANEL
ALT: 8 U/L (ref 0–35)
AST: 12 U/L (ref 0–37)
Albumin: 2.4 g/dL — ABNORMAL LOW (ref 3.5–5.2)
Alkaline Phosphatase: 30 U/L — ABNORMAL LOW (ref 39–117)
Anion gap: 5 (ref 5–15)
BILIRUBIN TOTAL: 0.5 mg/dL (ref 0.3–1.2)
BUN: 14 mg/dL (ref 6–23)
CO2: 27 mmol/L (ref 19–32)
Calcium: 7 mg/dL — ABNORMAL LOW (ref 8.4–10.5)
Chloride: 99 mEq/L (ref 96–112)
Creatinine, Ser: <0.3 mg/dL — ABNORMAL LOW (ref 0.50–1.10)
Glucose, Bld: 89 mg/dL (ref 70–99)
POTASSIUM: 2.8 mmol/L — AB (ref 3.5–5.1)
Sodium: 131 mmol/L — ABNORMAL LOW (ref 135–145)
Total Protein: 4.3 g/dL — ABNORMAL LOW (ref 6.0–8.3)

## 2014-12-28 LAB — CBC WITH DIFFERENTIAL/PLATELET
Basophils Absolute: 0 10*3/uL (ref 0.0–0.1)
Basophils Relative: 0 % (ref 0–1)
Eosinophils Absolute: 0 10*3/uL (ref 0.0–0.7)
Eosinophils Relative: 0 % (ref 0–5)
HEMATOCRIT: 29.4 % — AB (ref 36.0–46.0)
HEMOGLOBIN: 8.7 g/dL — AB (ref 12.0–15.0)
Lymphocytes Relative: 13 % (ref 12–46)
Lymphs Abs: 0.4 10*3/uL — ABNORMAL LOW (ref 0.7–4.0)
MCH: 26.9 pg (ref 26.0–34.0)
MCHC: 29.6 g/dL — ABNORMAL LOW (ref 30.0–36.0)
MCV: 91 fL (ref 78.0–100.0)
Monocytes Absolute: 0.2 10*3/uL (ref 0.1–1.0)
Monocytes Relative: 7 % (ref 3–12)
NEUTROS ABS: 2.2 10*3/uL (ref 1.7–7.7)
Neutrophils Relative %: 80 % — ABNORMAL HIGH (ref 43–77)
PLATELETS: 95 10*3/uL — AB (ref 150–400)
RBC: 3.23 MIL/uL — ABNORMAL LOW (ref 3.87–5.11)
RDW: 16.9 % — ABNORMAL HIGH (ref 11.5–15.5)
WBC: 2.8 10*3/uL — AB (ref 4.0–10.5)

## 2014-12-28 LAB — VANCOMYCIN, TROUGH: Vancomycin Tr: 27.7 ug/mL (ref 10.0–20.0)

## 2014-12-28 MED ORDER — VITAL HIGH PROTEIN PO LIQD
1000.0000 mL | ORAL | Status: DC
  Filled 2014-12-28 (×3): qty 1000

## 2014-12-28 MED ORDER — VITAL HIGH PROTEIN PO LIQD
1000.0000 mL | ORAL | Status: DC
  Administered 2014-12-28 – 2014-12-29 (×2): 1000 mL
  Administered 2014-12-30 (×4)
  Administered 2014-12-30: 1000 mL
  Administered 2014-12-30 – 2014-12-31 (×9)
  Filled 2014-12-28 (×5): qty 1000

## 2014-12-28 MED ORDER — VITAL HIGH PROTEIN PO LIQD
1000.0000 mL | ORAL | Status: DC
  Filled 2014-12-28 (×2): qty 1000

## 2014-12-28 MED ORDER — ALBUTEROL SULFATE (2.5 MG/3ML) 0.083% IN NEBU
2.5000 mg | INHALATION_SOLUTION | Freq: Four times a day (QID) | RESPIRATORY_TRACT | Status: DC
  Administered 2014-12-28 – 2015-01-10 (×52): 2.5 mg via RESPIRATORY_TRACT
  Filled 2014-12-28 (×51): qty 3

## 2014-12-28 MED ORDER — POTASSIUM CHLORIDE 10 MEQ/100ML IV SOLN
10.0000 meq | INTRAVENOUS | Status: AC
  Administered 2014-12-28 (×6): 10 meq via INTRAVENOUS
  Filled 2014-12-28 (×6): qty 100

## 2014-12-28 MED ORDER — PRO-STAT SUGAR FREE PO LIQD
60.0000 mL | Freq: Three times a day (TID) | ORAL | Status: DC
  Administered 2014-12-28 – 2014-12-31 (×9): 60 mL via ORAL
  Filled 2014-12-28 (×9): qty 60

## 2014-12-28 MED ORDER — VANCOMYCIN HCL 10 G IV SOLR
1250.0000 mg | INTRAVENOUS | Status: DC
  Administered 2014-12-29 (×2): 1250 mg via INTRAVENOUS
  Filled 2014-12-28 (×2): qty 1250

## 2014-12-28 NOTE — Progress Notes (Signed)
Subjective: She remains intubated and on the ventilator. She has tried off the ventilator but is extremely agitated. She's had some apneic episodes. She is on fentanyl and Versed as needed and propofol, gastric tube was placed today  Objective: Vital signs in last 24 hours: Temp:  [97.4 F (36.3 C)-98.6 F (37 C)] 97.4 F (36.3 C) (01/09 0745) Pulse Rate:  [25-126] 126 (01/09 0845) Resp:  [0-28] 19 (01/09 0845) BP: (102-172)/(52-108) 137/84 mmHg (01/09 0845) SpO2:  [91 %-100 %] 96 % (01/09 0845) FiO2 (%):  [32 %-40 %] 35 % (01/09 0911) Weight:  [98.9 kg (218 lb 0.6 oz)] 98.9 kg (218 lb 0.6 oz) (01/09 0730) Weight change:  Last BM Date: 12/27/14  Intake/Output from previous day: 01/08 0701 - 01/09 0700 In: 3177.1 [I.V.:2777.1; IV Piggyback:400] Out: 875 [Urine:875]  PHYSICAL EXAM General appearance: Intubated very agitated. She is attempting weaning now so she is off of some of the medications. Resp: rhonchi bilaterally Cardio: regular rate and rhythm, S1, S2 normal, no murmur, click, rub or gallop GI: soft, non-tender; bowel sounds normal; no masses,  no organomegaly Extremities: extremities normal, atraumatic, no cyanosis or edema  Lab Results:  Results for orders placed or performed during the hospital encounter of 12/24/14 (from the past 48 hour(s))  Glucose, capillary     Status: Abnormal   Collection Time: 12/26/14 11:08 AM  Result Value Ref Range   Glucose-Capillary 110 (H) 70 - 99 mg/dL  Glucose, capillary     Status: Abnormal   Collection Time: 12/26/14  4:56 PM  Result Value Ref Range   Glucose-Capillary 134 (H) 70 - 99 mg/dL   Comment 1 Documented in Chart    Comment 2 Notify RN   Glucose, capillary     Status: None   Collection Time: 12/26/14  7:58 PM  Result Value Ref Range   Glucose-Capillary 97 70 - 99 mg/dL  Glucose, capillary     Status: Abnormal   Collection Time: 12/26/14 11:57 PM  Result Value Ref Range   Glucose-Capillary 130 (H) 70 - 99 mg/dL   Glucose, capillary     Status: Abnormal   Collection Time: 12/27/14  3:49 AM  Result Value Ref Range   Glucose-Capillary 110 (H) 70 - 99 mg/dL  Blood gas, arterial     Status: Abnormal   Collection Time: 12/27/14  4:07 AM  Result Value Ref Range   FIO2 0.40 %   Delivery systems VENTILATOR    Mode PRESSURE REGULATED VOLUME CONTROL    VT 500 mL   Rate 10 resp/min   Peep/cpap 5.0 cm H20   pH, Arterial 7.436 7.350 - 7.450   pCO2 arterial 48.7 (H) 35.0 - 45.0 mmHg   pO2, Arterial 126.0 (H) 80.0 - 100.0 mmHg   Bicarbonate 32.2 (H) 20.0 - 24.0 mEq/L   TCO2 29.4 0 - 100 mmol/L   Acid-Base Excess 7.8 (H) 0.0 - 2.0 mmol/L   O2 Saturation 98.5 %   Patient temperature 37.0    Collection site LEFT RADIAL    Drawn by [id number]    Sample type ARTERIAL DRAW    Allens test (pass/fail) PASS PASS  Basic metabolic panel     Status: Abnormal   Collection Time: 12/27/14  4:25 AM  Result Value Ref Range   Sodium 140 135 - 145 mmol/L    Comment: Please note change in reference range.   Potassium 3.3 (L) 3.5 - 5.1 mmol/L    Comment: Please note change in reference range.  Chloride 99 96 - 112 mEq/L   CO2 35 (H) 19 - 32 mmol/L   Glucose, Bld 111 (H) 70 - 99 mg/dL   BUN 20 6 - 23 mg/dL   Creatinine, Ser 0.32 (L) 0.50 - 1.10 mg/dL   Calcium 8.4 8.4 - 10.5 mg/dL   GFR calc non Af Amer >90 >90 mL/min   GFR calc Af Amer >90 >90 mL/min    Comment: (NOTE) The eGFR has been calculated using the CKD EPI equation. This calculation has not been validated in all clinical situations. eGFR's persistently <90 mL/min signify possible Chronic Kidney Disease.    Anion gap 6 5 - 15  CBC     Status: Abnormal   Collection Time: 12/27/14  4:25 AM  Result Value Ref Range   WBC 4.4 4.0 - 10.5 K/uL   RBC 3.81 (L) 3.87 - 5.11 MIL/uL   Hemoglobin 10.3 (L) 12.0 - 15.0 g/dL   HCT 34.2 (L) 36.0 - 46.0 %   MCV 89.8 78.0 - 100.0 fL   MCH 27.0 26.0 - 34.0 pg   MCHC 30.1 30.0 - 36.0 g/dL   RDW 16.8 (H) 11.5 - 15.5 %    Platelets 107 (L) 150 - 400 K/uL    Comment: SPECIMEN CHECKED FOR CLOTS CONSISTENT WITH PREVIOUS RESULT   Glucose, capillary     Status: Abnormal   Collection Time: 12/27/14  7:48 AM  Result Value Ref Range   Glucose-Capillary 104 (H) 70 - 99 mg/dL   Comment 1 Notify RN   Glucose, capillary     Status: Abnormal   Collection Time: 12/27/14 11:57 AM  Result Value Ref Range   Glucose-Capillary 105 (H) 70 - 99 mg/dL   Comment 1 Notify RN   Glucose, capillary     Status: Abnormal   Collection Time: 12/27/14  4:42 PM  Result Value Ref Range   Glucose-Capillary 101 (H) 70 - 99 mg/dL  Glucose, capillary     Status: Abnormal   Collection Time: 12/27/14  7:42 PM  Result Value Ref Range   Glucose-Capillary 100 (H) 70 - 99 mg/dL  Glucose, capillary     Status: Abnormal   Collection Time: 12/28/14 12:27 AM  Result Value Ref Range   Glucose-Capillary 106 (H) 70 - 99 mg/dL   Comment 1 Notify RN   Glucose, capillary     Status: Abnormal   Collection Time: 12/28/14  4:15 AM  Result Value Ref Range   Glucose-Capillary 108 (H) 70 - 99 mg/dL   Comment 1 Notify RN   CBC with Differential     Status: Abnormal   Collection Time: 12/28/14  4:21 AM  Result Value Ref Range   WBC 2.8 (L) 4.0 - 10.5 K/uL   RBC 3.23 (L) 3.87 - 5.11 MIL/uL   Hemoglobin 8.7 (L) 12.0 - 15.0 g/dL   HCT 29.4 (L) 36.0 - 46.0 %   MCV 91.0 78.0 - 100.0 fL   MCH 26.9 26.0 - 34.0 pg   MCHC 29.6 (L) 30.0 - 36.0 g/dL   RDW 16.9 (H) 11.5 - 15.5 %   Platelets 95 (L) 150 - 400 K/uL    Comment: SPECIMEN CHECKED FOR CLOTS PLATELET COUNT CONFIRMED BY SMEAR    Neutrophils Relative % 80 (H) 43 - 77 %   Neutro Abs 2.2 1.7 - 7.7 K/uL   Lymphocytes Relative 13 12 - 46 %   Lymphs Abs 0.4 (L) 0.7 - 4.0 K/uL   Monocytes Relative 7 3 - 12 %  Monocytes Absolute 0.2 0.1 - 1.0 K/uL   Eosinophils Relative 0 0 - 5 %   Eosinophils Absolute 0.0 0.0 - 0.7 K/uL   Basophils Relative 0 0 - 1 %   Basophils Absolute 0.0 0.0 - 0.1 K/uL   Comprehensive metabolic panel     Status: Abnormal   Collection Time: 12/28/14  4:21 AM  Result Value Ref Range   Sodium 131 (L) 135 - 145 mmol/L    Comment: DELTA CHECK NOTED Please note change in reference range.    Potassium 2.8 (L) 3.5 - 5.1 mmol/L    Comment: Please note change in reference range.   Chloride 99 96 - 112 mEq/L   CO2 27 19 - 32 mmol/L   Glucose, Bld 89 70 - 99 mg/dL   BUN 14 6 - 23 mg/dL   Creatinine, Ser <0.30 (L) 0.50 - 1.10 mg/dL   Calcium 7.0 (L) 8.4 - 10.5 mg/dL   Total Protein 4.3 (L) 6.0 - 8.3 g/dL   Albumin 2.4 (L) 3.5 - 5.2 g/dL   AST 12 0 - 37 U/L   ALT 8 0 - 35 U/L   Alkaline Phosphatase 30 (L) 39 - 117 U/L   Total Bilirubin 0.5 0.3 - 1.2 mg/dL   GFR calc non Af Amer NOT CALCULATED >90 mL/min   GFR calc Af Amer NOT CALCULATED >90 mL/min    Comment: (NOTE) The eGFR has been calculated using the CKD EPI equation. This calculation has not been validated in all clinical situations. eGFR's persistently <90 mL/min signify possible Chronic Kidney Disease.    Anion gap 5 5 - 15  Blood gas, arterial     Status: Abnormal   Collection Time: 12/28/14  5:07 AM  Result Value Ref Range   FIO2 0.35 %   Delivery systems VENTILATOR    Mode PRESSURE REGULATED VOLUME CONTROL    VT 500 mL   Rate 10 resp/min   Peep/cpap 5.0 cm H20   pH, Arterial 7.410 7.350 - 7.450   pCO2 arterial 47.7 (H) 35.0 - 45.0 mmHg   pO2, Arterial 111.0 (H) 80.0 - 100.0 mmHg   Bicarbonate 29.6 (H) 20.0 - 24.0 mEq/L   TCO2 27.1 0 - 100 mmol/L   Acid-Base Excess 5.2 (H) 0.0 - 2.0 mmol/L   O2 Saturation 97.9 %   Patient temperature 37.0    Collection site RADIAL    Drawn by [id number]    Sample type ARTERIAL    Allens test (pass/fail) PASS PASS  Glucose, capillary     Status: Abnormal   Collection Time: 12/28/14  8:05 AM  Result Value Ref Range   Glucose-Capillary 114 (H) 70 - 99 mg/dL   Comment 1 Notify RN     ABGS  Recent Labs  12/28/14 0507  PHART 7.410  PO2ART 111.0*   TCO2 27.1  HCO3 29.6*   CULTURES Recent Results (from the past 240 hour(s))  Culture, blood (routine x 2)     Status: None (Preliminary result)   Collection Time: 12/24/14  3:30 PM  Result Value Ref Range Status   Specimen Description BLOOD RIGHT WRIST DRAWN BY RN [phone]  Final   Special Requests BOTTLES DRAWN AEROBIC AND ANAEROBIC 6CC  Final   Culture NO GROWTH 4 DAYS  Final   Report Status PENDING  Incomplete  Culture, blood (routine x 2)     Status: None   Collection Time: 12/24/14  3:30 PM  Result Value Ref Range Status   Specimen Description BLOOD  LEFT HAND DRAWN BY RN [phone]  Final   Special Requests BOTTLES DRAWN AEROBIC AND ANAEROBIC 6CC   Final   Culture   Final    STAPHYLOCOCCUS SPECIES (COAGULASE NEGATIVE) Note: THE SIGNIFICANCE OF ISOLATING THIS ORGANISM FROM A SINGLE VENIPUNCTURE CANNOT BE PREDICTED WITHOUT FURTHER CLINICAL AND CULTURE CORRELATION. SUSCEPTIBILITIES AVAILABLE ONLY ON REQUEST. Note: Gram Stain Report Called to,Read Back By and Verified With: PHILLIPS C. AT 1610 ON 12/25/14 BY BAUGHAM M. Performed at [facility] Performed at [facility]    Report Status 12/27/2014 FINAL  Final  Urine culture     Status: None   Collection Time: 12/24/14  3:40 PM  Result Value Ref Range Status   Specimen Description URINE, CATHETERIZED  Final   Special Requests NONE  Final   Colony Count NO GROWTH Performed at [facility]   Final   Culture NO GROWTH Performed at [facility]   Final   Report Status 12/25/2014 FINAL  Final  MRSA PCR Screening     Status: Abnormal   Collection Time: 12/24/14  9:20 PM  Result Value Ref Range Status   MRSA by PCR POSITIVE (A) NEGATIVE Final    Comment:        The GeneXpert MRSA Assay (FDA approved for NASAL specimens only), is one component of a comprehensive MRSA colonization surveillance program. It is not intended to diagnose MRSA infection nor to guide or monitor [place] [address] BY Mikel Cella K    Studies/Results: Dg Chest Port 1 View  12/28/2014   CLINICAL DATA:  Respiratory distress. Ventilated patient. Subsequent encounter.  EXAM: PORTABLE CHEST - 1 VIEW  COMPARISON:  Chest radiograph performed 12/27/2014  FINDINGS: The patient's endotracheal tube is seen ending 2 cm above the carina. A right PICC is noted likely extending to the inferior cavoatrial junction. This could be retracted 4 cm, as deemed clinically appropriate.  The lungs are hypoexpanded. Vascular crowding and vascular congestion are noted. Left basilar airspace opacification may reflect atelectasis or possibly pneumonia. A small left pleural effusion is suspected. No pneumothorax is seen.  The cardiomediastinal silhouette is borderline normal in size. No acute osseous abnormalities are identified.  IMPRESSION: 1. Right PICC appears to extend to the inferior cavoatrial junction. This could be retracted 4 cm, as deemed clinically appropriate. 2. Lungs hypoexpanded. Vascular congestion noted. Left basilar airspace opacification may reflect atelectasis or possibly pneumonia. Small left pleural effusion suspected.  These results were called by telephone at the time of interpretation on 12/28/2014 at 8:39 am to Nursing at the [facility] ICU, who verbally acknowledged these results.   Electronically Signed   By: Garald Balding M.D.   On: 12/28/2014 08:39   Dg Chest Port 1 View  12/27/2014   CLINICAL DATA:  Respiratory failure.  EXAM: PORTABLE CHEST - 1 VIEW  COMPARISON:  12/26/2014.  FINDINGS: Endotracheal tube and right PICC line in stable position. Mediastinal and hilar structures unremarkable. Mild persistent bibasilar atelectasis and/or infiltrates. Stable cardiomegaly. Small left pleural effusion cannot be excluded. No pneumothorax. No acute osseus abnormality .  IMPRESSION: 1. Lines and tubes in stable position. 2. Mild persistent bibasilar atelectasis and/or infiltrates.   Electronically Signed   By:  Marcello Moores  Register   On: 12/27/2014 07:46   Dg Chest Port 1 View  12/26/2014   CLINICAL DATA:  Respiratory failure  EXAM: PORTABLE CHEST - 1 VIEW  COMPARISON:  12/25/2014  FINDINGS: Cardiac shadow is stable. An endotracheal tube  is again seen approximately 3.5 cm above the carina. A right jugular line it has been removed in the interval. A right-sided PICC line is now noted with the tip at the cavoatrial junction. No focal infiltrate is seen. Very minimal right basilar atelectasis is noted.  IMPRESSION: Stable right basilar atelectasis.  Interval removal of right jugular central line.  New right-sided PICC line in satisfactory position.   Electronically Signed   By: Inez Catalina M.D.   On: 12/26/2014 16:05    Medications:  Prior to Admission:  Prescriptions prior to admission  Medication Sig Dispense Refill Last Dose  . acetaminophen (TYLENOL) 325 MG tablet Take 2 tablets (650 mg total) by mouth every 6 (six) hours as needed for mild pain (or Fever >/= 101).   unknown  . albuterol (PROVENTIL HFA;VENTOLIN HFA) 108 (90 BASE) MCG/ACT inhaler Inhale 2 puffs into the lungs every 4 (four) hours as needed. Shortness of breath 1 Inhaler 12 unknown  . albuterol (PROVENTIL) (2.5 MG/3ML) 0.083% nebulizer solution Take 2.5 mg by nebulization 4 (four) times daily.   12/24/2014 at 1200  . ALPRAZolam (XANAX) 1 MG tablet Take 1 mg by mouth 4 (four) times daily.    12/24/2014 at 1400  . Amino Acids-Protein Hydrolys (FEEDING SUPPLEMENT, PRO-STAT SUGAR FREE 64,) LIQD Take 30 mLs by mouth 2 (two) times daily with a meal.   12/24/2014 at 800a  . aspirin 325 MG tablet Take 1 tablet (325 mg total) by mouth daily. 30 tablet 12 12/24/2014 at [place]  . atorvastatin (LIPITOR) 40 MG tablet Take 40 mg by mouth daily.   12/24/2014 at [place]  . baclofen (LIORESAL) 10 MG tablet Take 5 mg by mouth 3 (three) times daily.   12/24/2014 at 1400  . cefTRIAXone (ROCEPHIN) 1 G injection Inject 1 g into the muscle once. Last administered on 12/23/2014 at  22:00 IM to right hip   12/23/2014  . cholecalciferol (VITAMIN D) 1000 UNITS tablet Take 1,000 Units by mouth daily.   12/24/2014 at [place]  . clopidogrel (PLAVIX) 75 MG tablet Take 1 tablet (75 mg total) by mouth daily with breakfast.   12/24/2014 at [place]  . Cranberry 450 MG CAPS Take 1 capsule by mouth daily.   12/24/2014 at 900a  . guaiFENesin (MUCINEX) 600 MG 12 hr tablet Take 600 mg by mouth 2 (two) times daily.   12/24/2014 at 800a  . haloperidol (HALDOL) 0.5 MG tablet Take 0.5 mg by mouth 2 (two) times daily.    12/24/2014 at 900a  . levothyroxine (SYNTHROID, LEVOTHROID) 88 MCG tablet Take 1 tablet (88 mcg total) by mouth daily before breakfast. 30 tablet 12 12/24/2014 at 600a  . loperamide (IMODIUM) 2 MG capsule Take 1 capsule (2 mg total) by mouth as needed for diarrhea or loose stools. 30 capsule 0 unknown  . loratadine (CLARITIN) 10 MG tablet Take 10 mg by mouth daily.   12/24/2014 at [place]  . nitroGLYCERIN (NITROSTAT) 0.4 MG SL tablet Place 1 tablet (0.4 mg total) under the tongue every 5 (five) minutes as needed for chest pain. 25 tablet 12 unknown  . pantoprazole (PROTONIX) 40 MG tablet Take 1 tablet (40 mg total) by mouth daily. 30 tablet 12 12/24/2014 at 600a  . polyethylene glycol (MIRALAX / GLYCOLAX) packet Take 17 g by mouth daily.   12/24/2014 at 900a  . potassium chloride SA (K-DUR,KLOR-CON) 20 MEQ tablet Take 10 mEq by mouth daily.    12/24/2014 at [place]  . predniSONE (DELTASONE) 5 MG tablet Take  5 mg by mouth daily with breakfast.   12/24/2014 at [place]  . ranolazine (RANEXA) 500 MG 12 hr tablet Take 1 tablet (500 mg total) by mouth 2 (two) times daily. 60 tablet 3 12/24/2014 at [place]  . saccharomyces boulardii (FLORASTOR) 250 MG capsule Take 250 mg by mouth 2 (two) times daily.   12/24/2014 at 800a  . sertraline (ZOLOFT) 100 MG tablet Take 1 tablet (100 mg total) by mouth daily.   12/24/2014 at 900a  . temazepam (RESTORIL) 7.5 MG capsule Take 7.5 mg by mouth at bedtime as needed for sleep.   unknown  .  traMADol (ULTRAM) 50 MG tablet Take 50 mg by mouth every 6 (six) hours as needed for moderate pain.   unknown  . furosemide (LASIX) 20 MG tablet Take 20 mg by mouth daily.   Taking  . trolamine salicylate (ASPERCREME) 10 % cream Apply topically as needed for muscle pain. (Patient not taking: Reported on 12/24/2014) 85 g 0 Taking   Scheduled: . albuterol  2.5 mg Nebulization QID  . antiseptic oral rinse  7 mL Mouth Rinse QID  . ceFEPime (MAXIPIME) IV  1 g Intravenous 3 times per day  . chlorhexidine  15 mL Mouth Rinse BID  . Chlorhexidine Gluconate Cloth  6 each Topical Q0600  . enoxaparin (LOVENOX) injection  40 mg Subcutaneous Q24H  . insulin aspart  0-20 Units Subcutaneous 6 times per day  . methylPREDNISolone (SOLU-MEDROL) injection  40 mg Intravenous Q6H  . mupirocin ointment  1 application Nasal BID  . pantoprazole (PROTONIX) IV  40 mg Intravenous Q24H  . sodium chloride  10-40 mL Intracatheter Q12H  . vancomycin  1,250 mg Intravenous Q12H   Continuous: . sodium chloride 75 mL/hr at 12/28/14 0100  . fentaNYL infusion INTRAVENOUS 125 mcg/hr (12/28/14 0500)  . propofol 46 mcg/kg/min (12/28/14 0857)   GEZ:MOQHUT chloride, acetaminophen, fentaNYL, midazolam, ondansetron (ZOFRAN) IV, sodium chloride  Assesment: She has acute on chronic hypercapnic respiratory failure. She was septic on admission . It was thought she had urinary tract infection but her culture is negative. She remains on IV steroids IV antibiotics. She is very agitated which is limiting our ability to get her off the ventilator. 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 I don't think we can get her off the ventilator today. I'm  going to leave her on the antibiotics since she appeared to be septic. Start nutritional support today    LOS: 4 days   , L 12/28/2014, 9:20 AM

## 2014-12-28 NOTE — Progress Notes (Signed)
[place] for Vancomycin & Cefepime Indication: urosepsis  Allergies  Allergen Reactions  . Latex     Provided via MAR  . Naproxen Other (See Comments)    Unknown-Provided via MAR    Patient Measurements: Height: 5\' 6"  (167.6 cm) Weight: 218 lb 0.6 oz (98.9 kg) IBW/kg (Calculated) : 59.3  Vital Signs: Temp: 97.6 F (36.4 C) (01/09 1215) Temp Source: Axillary (01/09 1215) BP: 136/70 mmHg (01/09 1230) Pulse Rate: 67 (01/09 1230) Intake/Output from previous day: 01/08 0701 - 01/09 0700 In: 3177.1 [I.V.:2777.1; IV Piggyback:400] Out: 875 [Urine:875] Intake/Output from this shift: Total I/O In: -  Out: 550 [Urine:550]  Labs:  Recent Labs  12/26/14 0434 12/27/14 0425 12/28/14 0421  WBC 3.8* 4.4 2.8*  HGB 10.4* 10.3* 8.7*  PLT 96* 107* 95*  CREATININE 0.34* 0.32* <0.30*   CrCl cannot be calculated (This lab value for creatinine cannot be used to calculate CrCl because it is not a number: <0.30).  Recent Labs  12/28/14 1037  VANCOTROUGH 27.7*     Microbiology: Recent Results (from the past 720 hour(s))  Culture, blood (routine x 2)     Status: None (Preliminary result)   Collection Time: 12/24/14  3:30 PM  Result Value Ref Range Status   Specimen Description BLOOD RIGHT WRIST DRAWN BY RN [phone]  Final   Special Requests BOTTLES DRAWN AEROBIC AND ANAEROBIC 6CC  Final   Culture NO GROWTH 4 DAYS  Final   Report Status PENDING  Incomplete  Culture, blood (routine x 2)     Status: None   Collection Time: 12/24/14  3:30 PM  Result Value Ref Range Status   Specimen Description BLOOD LEFT HAND DRAWN BY RN [phone]  Final   Special Requests BOTTLES DRAWN AEROBIC AND ANAEROBIC 6CC   Final   Culture   Final    STAPHYLOCOCCUS SPECIES (COAGULASE NEGATIVE) Note: THE SIGNIFICANCE OF ISOLATING THIS ORGANISM FROM A SINGLE VENIPUNCTURE CANNOT BE PREDICTED WITHOUT FURTHER CLINICAL AND CULTURE CORRELATION. SUSCEPTIBILITIES AVAILABLE ONLY ON  REQUEST. Note: Gram Stain Report Called to,Read Back By and Verified With: PHILLIPS C. AT 1610 ON 12/25/14 BY BAUGHAM M. Performed at [facility] Performed at [facility]    Report Status 12/27/2014 FINAL  Final  Urine culture     Status: None   Collection Time: 12/24/14  3:40 PM  Result Value Ref Range Status   Specimen Description URINE, CATHETERIZED  Final   Special Requests NONE  Final   Colony Count NO GROWTH Performed at [facility]   Final   Culture NO GROWTH Performed at [facility]   Final   Report Status 12/25/2014 FINAL  Final  MRSA PCR Screening     Status: Abnormal   Collection Time: 12/24/14  9:20 PM  Result Value Ref Range Status   MRSA by PCR POSITIVE (A) NEGATIVE Final    Comment:        The GeneXpert MRSA Assay (FDA approved for NASAL specimens only), is one component of a comprehensive MRSA colonization surveillance program. It is not intended to diagnose MRSA infection nor to guide [place] [address] BY FORSYTH K     Anti-infectives    Start     Dose/Rate Route Frequency Ordered Stop   12/25/14 0000  vancomycin (VANCOCIN) 1,250 mg in sodium chloride 0.9 % 250 mL IVPB     1,250 mg166.7 mL/hr over 90 Minutes Intravenous Every 12 hours 12/24/14 2017  12/24/14 2200  ceFEPIme (MAXIPIME) 1 g in dextrose 5 % 50 mL IVPB     1 g100 mL/hr over 30 Minutes Intravenous 3 times per day 12/24/14 2017     12/24/14 1615  vancomycin (VANCOCIN) IVPB 1000 mg/200 mL premix     1,000 mg200 mL/hr over 60 Minutes Intravenous  Once 12/24/14 1606 12/24/14 1750   12/24/14 1530  ceFEPIme (MAXIPIME) 2 g in dextrose 5 % 50 mL IVPB     2 g100 mL/hr over 30 Minutes Intravenous  Once 12/24/14 1516 12/24/14 1635      Assessment: 74 yo F with hx cerebral palsy who is wheelchair bound & resides at SNF.  She received IM dose of Rocephin at NH prior to admit, however continued to decline.  She was brought to ED with  lethargy & fever.  She remians intubated.   She is currently afebrile with normal WBC and lactic acid level.  CXR did not reveal PNA. She has hx of Acinetobacter in urine that was resistant to cephalosporins (05/2014).   Blood cx 1/2 CNS (likely contaminant), urine cx negative, but was on Rocephin PTA. Renal function stable since admission.  Likely over-estimation of CrCl using population kinetics due to low muscle mass in wheelchair bound, elderly female.  Vancomycin trough elevated.    Vancomycin 1/5>> Cefepime 1/5>>  Goal of Therapy:  Vancomycin trough level 15-20 mcg/ml  Plan:  Continue Cefepime 1gm IV q8h Decrease Vancomycin 1250mg  IV q24h Recheck Vancomycin trough at steady state  Monitor renal function and cx data  Duration of therapy per MD -Recommend d/c Vancomycin since cx data negative.  Continue Cefepime or Rocephin to complete 7 day course for UTI (thru 1/11).  Biagio Borg 12/28/2014,12:44 PM

## 2014-12-28 NOTE — Progress Notes (Signed)
RT weaned Pt on 10/5. The Pt became very irritated and when she relaxed her RR would go to 5-6 respirations. We made need to just pull the ET tube.

## 2014-12-28 NOTE — Progress Notes (Signed)
Patent rate decreased to 8 , FiO2, 32 after morning ABG. Most likely normal PH is 7.31 range or less , PCO2 60's. Baseline. As of this time she is still not breathing over vent.

## 2014-12-28 NOTE — Progress Notes (Signed)
NUTRITION FOLLOW UP  Intervention:   Initiate Vital High Protein @ 15 ml/hr.  60 ml Prostat TID.    Tube feeding regimen provides 1032 kcal (80% of needs- 1395 kcals with current propofol rate (100% of needs)), 122 grams of protein, and 301 ml of H2O.   Nutrition Dx:   Inadequate oral intake related to inability to eat as evidenced by NPO; ongoing  Goal:   Enteral nutrition to provide 60-70% of estimated calorie needs (22-25 kcals/kg ideal body weight) and 100% of estimated protein needs, based on ASPEN guidelines for hypocaloric, high protein feeding in critically ill obese individuals  Monitor:   Respiratory status, TF tolerance/advancement, labs, weight changes, I/O's  Assessment:   Pt has UTI, encephalopathy and is septic. Her hx includes COPD, Pneumonia and Cerebral palsy.   Patient remains intubated on ventilator support. OGT placed today.  MV: 7.3 L/min Temp (24hrs), Avg:97.9 F (36.6 C), Min:97.4 F (36.3 C), Max:98.2 F (36.8 C)  Propofol: 35.4 ml/hr (935 kcals)  Noted orders from TF protocol to start Vital High Protein to goal of 40 ml/hr, however TF has not been hung yet.  Labs reviewed. Na: 131, K: 2.8 (on supplement), Creat: <0.3, Calcium: 7.8, CBGS: 106-114.    Height: Ht Readings from Last 1 Encounters:  12/28/14 5\' 6"  (1.676 m)    Weight Status:   Wt Readings from Last 1 Encounters:  12/28/14 218 lb 0.6 oz (98.9 kg)   12/25/14 216 lb 7.9 oz (98.2 kg)   Re-estimated needs:  Kcal: 1609  Underfeeding goal: 1298-1475 kcal Protein: >118 gr Fluid: >1600 ml daily  Skin: Intact  Diet Order: Diet NPO time specified   Intake/Output Summary (Last 24 hours) at 12/28/14 1212 Last data filed at 12/28/14 0818  Gross per 24 hour  Intake 3177.06 ml  Output    875 ml  Net 2302.06 ml    Last BM: 12/27/14   Labs:   Recent Labs Lab 12/26/14 0434 12/27/14 0425 12/28/14 0421  NA 140 140 131*  K 3.6 3.3* 2.8*  CL 97 99 99  CO2 36* 35* 27  BUN  23 20 14   CREATININE 0.34* 0.32* <0.30*  CALCIUM 8.3* 8.4 7.0*  GLUCOSE 129* 111* 89    CBG (last 3)   Recent Labs  12/28/14 0027 12/28/14 0415 12/28/14 0805  GLUCAP 106* 108* 114*    Scheduled Meds: . albuterol  2.5 mg Nebulization QID  . antiseptic oral rinse  7 mL Mouth Rinse QID  . ceFEPime (MAXIPIME) IV  1 g Intravenous 3 times per day  . chlorhexidine  15 mL Mouth Rinse BID  . Chlorhexidine Gluconate Cloth  6 each Topical Q0600  . enoxaparin (LOVENOX) injection  40 mg Subcutaneous Q24H  . feeding supplement (VITAL HIGH PROTEIN)  1,000 mL Per Tube Q24H  . insulin aspart  0-20 Units Subcutaneous 6 times per day  . methylPREDNISolone (SOLU-MEDROL) injection  40 mg Intravenous Q6H  . mupirocin ointment  1 application Nasal BID  . pantoprazole (PROTONIX) IV  40 mg Intravenous Q24H  . potassium chloride  10 mEq Intravenous Q1 Hr x 6  . sodium chloride  10-40 mL Intracatheter Q12H  . vancomycin  1,250 mg Intravenous Q12H    Continuous Infusions: . sodium chloride 75 mL/hr at 12/28/14 0100  . fentaNYL infusion INTRAVENOUS 150 mcg/hr (12/28/14 1120)  . propofol 60 mcg/kg/min (12/28/14 1120)    Laloni Rowton A. Jimmye Norman, RD, LDN, CDE Pager: [phone] After hours Pager: [phone]

## 2014-12-28 NOTE — Progress Notes (Signed)
Spoke with radiology. Asked nursing to pull back 4 cm on her PICC. AC informed. PICC nurse to be called in.

## 2014-12-29 ENCOUNTER — Inpatient Hospital Stay (HOSPITAL_COMMUNITY): Payer: Medicare Other

## 2014-12-29 LAB — GLUCOSE, CAPILLARY
GLUCOSE-CAPILLARY: 113 mg/dL — AB (ref 70–99)
GLUCOSE-CAPILLARY: 133 mg/dL — AB (ref 70–99)
GLUCOSE-CAPILLARY: 153 mg/dL — AB (ref 70–99)
GLUCOSE-CAPILLARY: 162 mg/dL — AB (ref 70–99)
Glucose-Capillary: 144 mg/dL — ABNORMAL HIGH (ref 70–99)
Glucose-Capillary: 133 mg/dL — ABNORMAL HIGH (ref 70–99)
Glucose-Capillary: 122 mg/dL — ABNORMAL HIGH (ref 70–99)

## 2014-12-29 LAB — BASIC METABOLIC PANEL
Anion gap: 5 (ref 5–15)
BUN: 18 mg/dL (ref 6–23)
CALCIUM: 7.9 mg/dL — AB (ref 8.4–10.5)
CHLORIDE: 100 meq/L (ref 96–112)
CO2: 30 mmol/L (ref 19–32)
GLUCOSE: 142 mg/dL — AB (ref 70–99)
POTASSIUM: 4.1 mmol/L (ref 3.5–5.1)
Sodium: 135 mmol/L (ref 135–145)

## 2014-12-29 LAB — BLOOD GAS, ARTERIAL
ACID-BASE EXCESS: 3.8 mmol/L — AB (ref 0.0–2.0)
Bicarbonate: 29.5 mEq/L — ABNORMAL HIGH (ref 20.0–24.0)
FIO2: 0.35 %
MECHVT: 500 mL
O2 Saturation: 95.8 %
PEEP: 5 cmH2O
PH ART: 7.319 — AB (ref 7.350–7.450)
Patient temperature: 37
RATE: 8 resp/min
pCO2 arterial: 59.1 mmHg (ref 35.0–45.0)
pO2, Arterial: 86.4 mmHg (ref 80.0–100.0)

## 2014-12-29 LAB — TRIGLYCERIDES
TRIGLYCERIDES: 169 mg/dL — AB (ref <150)
Triglycerides: 1557 mg/dL — ABNORMAL HIGH (ref <150)

## 2014-12-29 MED ORDER — FENTANYL CITRATE 0.05 MG/ML IJ SOLN
INTRAMUSCULAR | Status: AC
  Filled 2014-12-29: qty 50

## 2014-12-29 NOTE — Progress Notes (Signed)
Spoke with son tonight for an update. Explained plan to extubate tomorrow. Asked if he would want [place] to re-intubate if there was a need. He stated yes. Son stated that he would call around noon tomorrow if he hadn't heard from any staff.

## 2014-12-29 NOTE — Progress Notes (Signed)
Subjective: She is sedated and calm this morning. She remains intubated and on the ventilator  Objective: Vital signs in last 24 hours: Temp:  [96.8 F (36 C)-98.8 F (37.1 C)] 98.6 F (37 C) (01/10 0400) Pulse Rate:  [29-140] 51 (01/10 0645) Resp:  [11-27] 17 (01/10 0645) BP: (100-165)/(49-93) 106/52 mmHg (01/10 0645) SpO2:  [91 %-99 %] 96 % (01/10 0645) FiO2 (%):  [25 %-35 %] 35 % (01/10 0759) Weight:  [99.5 kg (219 lb 5.7 oz)] 99.5 kg (219 lb 5.7 oz) (01/10 0515) Weight change:  Last BM Date: 12/27/14  Intake/Output from previous day: 01/09 0701 - 01/10 0700 In: 4146.4 [I.V.:2831.9; NG/GT:314.5; IV Piggyback:1000] Out: 2500 [Urine:2500]  PHYSICAL EXAM General appearance: Intubated, sedated and calm Resp: rhonchi bilaterally Cardio: regular rate and rhythm, S1, S2 normal, no murmur, click, rub or gallop GI: soft, non-tender; bowel sounds normal; no masses,  no organomegaly Extremities: extremities normal, atraumatic, no cyanosis or edema  Lab Results:  Results for orders placed or performed during the hospital encounter of 12/24/14 (from the past 48 hour(s))  Glucose, capillary     Status: Abnormal   Collection Time: 12/27/14 11:57 AM  Result Value Ref Range   Glucose-Capillary 105 (H) 70 - 99 mg/dL   Comment 1 Notify RN   Glucose, capillary     Status: Abnormal   Collection Time: 12/27/14  4:42 PM  Result Value Ref Range   Glucose-Capillary 101 (H) 70 - 99 mg/dL  Glucose, capillary     Status: Abnormal   Collection Time: 12/27/14  7:42 PM  Result Value Ref Range   Glucose-Capillary 100 (H) 70 - 99 mg/dL  Glucose, capillary     Status: Abnormal   Collection Time: 12/28/14 12:27 AM  Result Value Ref Range   Glucose-Capillary 106 (H) 70 - 99 mg/dL   Comment 1 Notify RN   Glucose, capillary     Status: Abnormal   Collection Time: 12/28/14  4:15 AM  Result Value Ref Range   Glucose-Capillary 108 (H) 70 - 99 mg/dL   Comment 1 Notify RN   CBC with Differential      Status: Abnormal   Collection Time: 12/28/14  4:21 AM  Result Value Ref Range   WBC 2.8 (L) 4.0 - 10.5 K/uL   RBC 3.23 (L) 3.87 - 5.11 MIL/uL   Hemoglobin 8.7 (L) 12.0 - 15.0 g/dL   HCT 29.4 (L) 36.0 - 46.0 %   MCV 91.0 78.0 - 100.0 fL   MCH 26.9 26.0 - 34.0 pg   MCHC 29.6 (L) 30.0 - 36.0 g/dL   RDW 16.9 (H) 11.5 - 15.5 %   Platelets 95 (L) 150 - 400 K/uL    Comment: SPECIMEN CHECKED FOR CLOTS PLATELET COUNT CONFIRMED BY SMEAR    Neutrophils Relative % 80 (H) 43 - 77 %   Neutro Abs 2.2 1.7 - 7.7 K/uL   Lymphocytes Relative 13 12 - 46 %   Lymphs Abs 0.4 (L) 0.7 - 4.0 K/uL   Monocytes Relative 7 3 - 12 %   Monocytes Absolute 0.2 0.1 - 1.0 K/uL   Eosinophils Relative 0 0 - 5 %   Eosinophils Absolute 0.0 0.0 - 0.7 K/uL   Basophils Relative 0 0 - 1 %   Basophils Absolute 0.0 0.0 - 0.1 K/uL  Comprehensive metabolic panel     Status: Abnormal   Collection Time: 12/28/14  4:21 AM  Result Value Ref Range   Sodium 131 (L) 135 - 145 mmol/L  Comment: DELTA CHECK NOTED Please note change in reference range.    Potassium 2.8 (L) 3.5 - 5.1 mmol/L    Comment: Please note change in reference range.   Chloride 99 96 - 112 mEq/L   CO2 27 19 - 32 mmol/L   Glucose, Bld 89 70 - 99 mg/dL   BUN 14 6 - 23 mg/dL   Creatinine, Ser <0.30 (L) 0.50 - 1.10 mg/dL   Calcium 7.0 (L) 8.4 - 10.5 mg/dL   Total Protein 4.3 (L) 6.0 - 8.3 g/dL   Albumin 2.4 (L) 3.5 - 5.2 g/dL   AST 12 0 - 37 U/L   ALT 8 0 - 35 U/L   Alkaline Phosphatase 30 (L) 39 - 117 U/L   Total Bilirubin 0.5 0.3 - 1.2 mg/dL   GFR calc non Af Amer NOT CALCULATED >90 mL/min   GFR calc Af Amer NOT CALCULATED >90 mL/min    Comment: (NOTE) The eGFR has been calculated using the CKD EPI equation. This calculation has not been validated in all clinical situations. eGFR's persistently <90 mL/min signify possible Chronic Kidney Disease.    Anion gap 5 5 - 15  Blood gas, arterial     Status: Abnormal   Collection Time: 12/28/14  5:07 AM   Result Value Ref Range   FIO2 0.35 %   Delivery systems VENTILATOR    Mode PRESSURE REGULATED VOLUME CONTROL    VT 500 mL   Rate 10 resp/min   Peep/cpap 5.0 cm H20   pH, Arterial 7.410 7.350 - 7.450   pCO2 arterial 47.7 (H) 35.0 - 45.0 mmHg   pO2, Arterial 111.0 (H) 80.0 - 100.0 mmHg   Bicarbonate 29.6 (H) 20.0 - 24.0 mEq/L   TCO2 27.1 0 - 100 mmol/L   Acid-Base Excess 5.2 (H) 0.0 - 2.0 mmol/L   O2 Saturation 97.9 %   Patient temperature 37.0    Collection site RADIAL    Drawn by [id number]    Sample type ARTERIAL    Allens test (pass/fail) PASS PASS  Glucose, capillary     Status: Abnormal   Collection Time: 12/28/14  8:05 AM  Result Value Ref Range   Glucose-Capillary 114 (H) 70 - 99 mg/dL   Comment 1 Notify RN   Vancomycin, trough     Status: Abnormal   Collection Time: 12/28/14 10:37 AM  Result Value Ref Range   Vancomycin Tr 27.7 (HH) 10.0 - 20.0 ug/mL    Comment: REPEATED TO VERIFY CRITICAL RESULT CALLED TO, READ BACK BY AND VERIFIED WITH: SPANGLER,E AT 1146 ON 12/28/2014 BY MOSLEY,J   Glucose, capillary     Status: None   Collection Time: 12/28/14 11:45 AM  Result Value Ref Range   Glucose-Capillary 99 70 - 99 mg/dL   Comment 1 Notify RN   Glucose, capillary     Status: None   Collection Time: 12/28/14  4:40 PM  Result Value Ref Range   Glucose-Capillary 92 70 - 99 mg/dL   Comment 1 Notify RN   Glucose, capillary     Status: Abnormal   Collection Time: 12/28/14  8:12 PM  Result Value Ref Range   Glucose-Capillary 117 (H) 70 - 99 mg/dL   Comment 1 Notify RN   Glucose, capillary     Status: Abnormal   Collection Time: 12/29/14 12:01 AM  Result Value Ref Range   Glucose-Capillary 113 (H) 70 - 99 mg/dL   Comment 1 Notify RN   Glucose, capillary  Status: Abnormal   Collection Time: 12/29/14  4:08 AM  Result Value Ref Range   Glucose-Capillary 162 (H) 70 - 99 mg/dL   Comment 1 Notify RN   Basic metabolic panel     Status: Abnormal   Collection Time: 12/29/14   5:27 AM  Result Value Ref Range   Sodium 135 135 - 145 mmol/L    Comment: Please note change in reference range.   Potassium 4.1 3.5 - 5.1 mmol/L    Comment: DELTA CHECK NOTED Please note change in reference range.    Chloride 100 96 - 112 mEq/L   CO2 30 19 - 32 mmol/L   Glucose, Bld 142 (H) 70 - 99 mg/dL   BUN 18 6 - 23 mg/dL   Creatinine, Ser <0.30 (L) 0.50 - 1.10 mg/dL   Calcium 7.9 (L) 8.4 - 10.5 mg/dL   GFR calc non Af Amer NOT CALCULATED >90 mL/min   GFR calc Af Amer NOT CALCULATED >90 mL/min    Comment: (NOTE) The eGFR has been calculated using the CKD EPI equation. This calculation has not been validated in all clinical situations. eGFR's persistently <90 mL/min signify possible Chronic Kidney Disease.    Anion gap 5 5 - 15  Blood gas, arterial     Status: Abnormal   Collection Time: 12/29/14  6:09 AM  Result Value Ref Range   FIO2 0.35 %   Delivery systems VENTILATOR    Mode PRESSURE REGULATED VOLUME CONTROL    VT 500 mL   Rate 8 resp/min   Peep/cpap 5.0 cm H20   pH, Arterial 7.319 (L) 7.350 - 7.450   pCO2 arterial 59.1 (HH) 35.0 - 45.0 mmHg    Comment: CRITICAL RESULT CALLED TO, READ BACK BY AND VERIFIED WITH:  TO Candiss Norse RN AT 4132 12/29/2013 [place] RRT    pO2, Arterial 86.4 80.0 - 100.0 mmHg   Bicarbonate 29.5 (H) 20.0 - 24.0 mEq/L   Acid-Base Excess 3.8 (H) 0.0 - 2.0 mmol/L   O2 Saturation 95.8 %   Patient temperature 37.0    Collection site RADIAL    Drawn by COLLECTED BY RT    Sample type ARTERIAL    Allens test (pass/fail) PASS PASS  Glucose, capillary     Status: Abnormal   Collection Time: 12/29/14  7:36 AM  Result Value Ref Range   Glucose-Capillary 144 (H) 70 - 99 mg/dL   Comment 1 Notify RN     ABGS  Recent Labs  12/28/14 0507 12/29/14 0609  PHART 7.410 7.319*  PO2ART 111.0* 86.4  TCO2 27.1  --   HCO3 29.6* 29.5*   CULTURES Recent Results (from the past 240 hour(s))  Culture, blood (routine x 2)     Status: None (Preliminary  result)   Collection Time: 12/24/14  3:30 PM  Result Value Ref Range Status   Specimen Description BLOOD RIGHT WRIST DRAWN BY RN [phone]  Final   Special Requests BOTTLES DRAWN AEROBIC AND ANAEROBIC 6CC  Final   Culture NO GROWTH 4 DAYS  Final   Report Status PENDING  Incomplete  Culture, blood (routine x 2)     Status: None   Collection Time: 12/24/14  3:30 PM  Result Value Ref Range Status   Specimen Description BLOOD LEFT HAND DRAWN BY RN [phone]  Final   Special Requests BOTTLES DRAWN AEROBIC AND ANAEROBIC 6CC   Final   Culture   Final    STAPHYLOCOCCUS SPECIES (COAGULASE NEGATIVE) Note: THE SIGNIFICANCE OF ISOLATING THIS  ORGANISM FROM A SINGLE VENIPUNCTURE CANNOT BE PREDICTED WITHOUT FURTHER CLINICAL AND CULTURE CORRELATION. SUSCEPTIBILITIES AVAILABLE ONLY ON REQUEST. Note: Gram Stain Report Called to,Read Back By and Verified With: PHILLIPS C. AT 1610 ON 12/25/14 BY BAUGHAM M. Performed at [facility] Performed at [facility]    Report Status 12/27/2014 FINAL  Final  Urine culture     Status: None   Collection Time: 12/24/14  3:40 PM  Result Value Ref Range Status   Specimen Description URINE, CATHETERIZED  Final   Special Requests NONE  Final   Colony Count NO GROWTH Performed at [facility]   Final   Culture NO GROWTH Performed at [facility]   Final   Report Status 12/25/2014 FINAL  Final  MRSA PCR Screening     Status: Abnormal   Collection Time: 12/24/14  9:20 PM  Result Value Ref Range Status   MRSA by PCR POSITIVE (A) NEGATIVE Final    Comment:        The GeneXpert MRSA Assay (FDA approved for NASAL specimens only), is one component of a comprehensive MRSA colonization surveillance program. It is not intended to diagnose MRSA infection nor to guide or [place] [address] BY FORSYTH K    Studies/Results: Dg Chest Port 1 View  12/28/2014   CLINICAL DATA:  OG-tube placement  EXAM: PORTABLE CHEST - 1  VIEW  COMPARISON:  12/28/2014 at 0725 hr  FINDINGS: Cardiomegaly with pulmonary vascular congestion and suspected mild interstitial edema. Mild patchy bilateral lower lobe opacities, likely atelectasis. Possible small left pleural effusion. No pneumothorax.  Endotracheal tube terminates 1.5 cm above the carina.  Enteric tube terminates in the gastric cardia.  IMPRESSION: Cardiomegaly with suspected mild interstitial edema and possible small left pleural effusion.  Endotracheal tube terminates 1.5 cm above the carina.  Enteric tube terminates in the gastric cardia.   Electronically Signed   By: Julian Hy M.D.   On: 12/28/2014 14:54   Dg Chest Port 1 View  12/28/2014   CLINICAL DATA:  Respiratory distress. Ventilated patient. Subsequent encounter.  EXAM: PORTABLE CHEST - 1 VIEW  COMPARISON:  Chest radiograph performed 12/27/2014  FINDINGS: The patient's endotracheal tube is seen ending 2 cm above the carina. A right PICC is noted likely extending to the inferior cavoatrial junction. This could be retracted 4 cm, as deemed clinically appropriate.  The lungs are hypoexpanded. Vascular crowding and vascular congestion are noted. Left basilar airspace opacification may reflect atelectasis or possibly pneumonia. A small left pleural effusion is suspected. No pneumothorax is seen.  The cardiomediastinal silhouette is borderline normal in size. No acute osseous abnormalities are identified.  IMPRESSION: 1. Right PICC appears to extend to the inferior cavoatrial junction. This could be retracted 4 cm, as deemed clinically appropriate. 2. Lungs hypoexpanded. Vascular congestion noted. Left basilar airspace opacification may reflect atelectasis or possibly pneumonia. Small left pleural effusion suspected.  These results were called by telephone at the time of interpretation on 12/28/2014 at 8:39 am to Nursing at the [facility] ICU, who verbally acknowledged these results.   Electronically Signed   By: Garald Balding  M.D.   On: 12/28/2014 08:39    Medications:  Prior to Admission:  Prescriptions prior to admission  Medication Sig Dispense Refill Last Dose  . acetaminophen (TYLENOL) 325 MG tablet Take 2 tablets (650 mg total) by mouth every 6 (six) hours as needed for mild pain (or Fever >/= 101).   unknown  .  albuterol (PROVENTIL HFA;VENTOLIN HFA) 108 (90 BASE) MCG/ACT inhaler Inhale 2 puffs into the lungs every 4 (four) hours as needed. Shortness of breath 1 Inhaler 12 unknown  . albuterol (PROVENTIL) (2.5 MG/3ML) 0.083% nebulizer solution Take 2.5 mg by nebulization 4 (four) times daily.   12/24/2014 at 1200  . ALPRAZolam (XANAX) 1 MG tablet Take 1 mg by mouth 4 (four) times daily.    12/24/2014 at 1400  . Amino Acids-Protein Hydrolys (FEEDING SUPPLEMENT, PRO-STAT SUGAR FREE 64,) LIQD Take 30 mLs by mouth 2 (two) times daily with a meal.   12/24/2014 at 800a  . aspirin 325 MG tablet Take 1 tablet (325 mg total) by mouth daily. 30 tablet 12 12/24/2014 at [place]  . atorvastatin (LIPITOR) 40 MG tablet Take 40 mg by mouth daily.   12/24/2014 at [place]  . baclofen (LIORESAL) 10 MG tablet Take 5 mg by mouth 3 (three) times daily.   12/24/2014 at 1400  . cefTRIAXone (ROCEPHIN) 1 G injection Inject 1 g into the muscle once. Last administered on 12/23/2014 at 22:00 IM to right hip   12/23/2014  . cholecalciferol (VITAMIN D) 1000 UNITS tablet Take 1,000 Units by mouth daily.   12/24/2014 at [place]  . clopidogrel (PLAVIX) 75 MG tablet Take 1 tablet (75 mg total) by mouth daily with breakfast.   12/24/2014 at [place]  . Cranberry 450 MG CAPS Take 1 capsule by mouth daily.   12/24/2014 at 900a  . guaiFENesin (MUCINEX) 600 MG 12 hr tablet Take 600 mg by mouth 2 (two) times daily.   12/24/2014 at 800a  . haloperidol (HALDOL) 0.5 MG tablet Take 0.5 mg by mouth 2 (two) times daily.    12/24/2014 at 900a  . levothyroxine (SYNTHROID, LEVOTHROID) 88 MCG tablet Take 1 tablet (88 mcg total) by mouth daily before breakfast. 30 tablet 12 12/24/2014 at 600a  .  loperamide (IMODIUM) 2 MG capsule Take 1 capsule (2 mg total) by mouth as needed for diarrhea or loose stools. 30 capsule 0 unknown  . loratadine (CLARITIN) 10 MG tablet Take 10 mg by mouth daily.   12/24/2014 at [place]  . nitroGLYCERIN (NITROSTAT) 0.4 MG SL tablet Place 1 tablet (0.4 mg total) under the tongue every 5 (five) minutes as needed for chest pain. 25 tablet 12 unknown  . pantoprazole (PROTONIX) 40 MG tablet Take 1 tablet (40 mg total) by mouth daily. 30 tablet 12 12/24/2014 at 600a  . polyethylene glycol (MIRALAX / GLYCOLAX) packet Take 17 g by mouth daily.   12/24/2014 at 900a  . potassium chloride SA (K-DUR,KLOR-CON) 20 MEQ tablet Take 10 mEq by mouth daily.    12/24/2014 at [place]  . predniSONE (DELTASONE) 5 MG tablet Take 5 mg by mouth daily with breakfast.   12/24/2014 at [place]  . ranolazine (RANEXA) 500 MG 12 hr tablet Take 1 tablet (500 mg total) by mouth 2 (two) times daily. 60 tablet 3 12/24/2014 at [place]  . saccharomyces boulardii (FLORASTOR) 250 MG capsule Take 250 mg by mouth 2 (two) times daily.   12/24/2014 at 800a  . sertraline (ZOLOFT) 100 MG tablet Take 1 tablet (100 mg total) by mouth daily.   12/24/2014 at 900a  . temazepam (RESTORIL) 7.5 MG capsule Take 7.5 mg by mouth at bedtime as needed for sleep.   unknown  . traMADol (ULTRAM) 50 MG tablet Take 50 mg by mouth every 6 (six) hours as needed for moderate pain.   unknown  . furosemide (LASIX) 20 MG tablet Take 20 mg  by mouth daily.   Taking  . trolamine salicylate (ASPERCREME) 10 % cream Apply topically as needed for muscle pain. (Patient not taking: Reported on 12/24/2014) 85 g 0 Taking   Scheduled: . albuterol  2.5 mg Nebulization QID  . antiseptic oral rinse  7 mL Mouth Rinse QID  . ceFEPime (MAXIPIME) IV  1 g Intravenous 3 times per day  . chlorhexidine  15 mL Mouth Rinse BID  . enoxaparin (LOVENOX) injection  40 mg Subcutaneous Q24H  . feeding supplement (PRO-STAT SUGAR FREE 64)  60 mL Oral TID WC  . feeding supplement (VITAL HIGH  PROTEIN)  1,000 mL Per Tube Q24H  . insulin aspart  0-20 Units Subcutaneous 6 times per day  . methylPREDNISolone (SOLU-MEDROL) injection  40 mg Intravenous Q6H  . mupirocin ointment  1 application Nasal BID  . pantoprazole (PROTONIX) IV  40 mg Intravenous Q24H  . sodium chloride  10-40 mL Intracatheter Q12H  . vancomycin  1,250 mg Intravenous Q24H   Continuous: . sodium chloride 75 mL/hr at 12/28/14 2059  . fentaNYL infusion INTRAVENOUS 200 mcg/hr (12/29/14 0507)  . propofol 70 mcg/kg/min (12/29/14 0349)   QAS:UORVIF chloride, acetaminophen, fentaNYL, midazolam, ondansetron (ZOFRAN) IV, sodium chloride  Assesment: She was admitted with acute on chronic respiratory failure. She has had metabolic encephalopathy. She was septic on admission. She is improved today. The biggest problem has been that she's been so agitated Active Problems:   Morbid obesity   Cerebral palsy   Hypertension   Sepsis   Acute and chronic respiratory failure with hypercapnia   Encephalopathy, metabolic   UTI (urinary tract infection)    Plan: She will have no attempt today at extubation because when we reduce her sedation she becomes very agitated. I think tomorrow she will simply have the endotracheal tube removed without going through the typical weaning process    LOS: 5 days   [name] L 12/29/2014, 8:16 AM

## 2014-12-30 LAB — CBC WITH DIFFERENTIAL/PLATELET
BASOS ABS: 0 10*3/uL (ref 0.0–0.1)
Basophils Relative: 0 % (ref 0–1)
EOS ABS: 0 10*3/uL (ref 0.0–0.7)
EOS PCT: 0 % (ref 0–5)
HCT: 35.4 % — ABNORMAL LOW (ref 36.0–46.0)
Hemoglobin: 10.3 g/dL — ABNORMAL LOW (ref 12.0–15.0)
Lymphocytes Relative: 12 % (ref 12–46)
Lymphs Abs: 0.3 10*3/uL — ABNORMAL LOW (ref 0.7–4.0)
MCH: 27 pg (ref 26.0–34.0)
MCHC: 29.1 g/dL — ABNORMAL LOW (ref 30.0–36.0)
MCV: 92.7 fL (ref 78.0–100.0)
MONO ABS: 0.2 10*3/uL (ref 0.1–1.0)
Monocytes Relative: 6 % (ref 3–12)
NEUTROS ABS: 2 10*3/uL (ref 1.7–7.7)
Neutrophils Relative %: 82 % — ABNORMAL HIGH (ref 43–77)
Platelets: 132 10*3/uL — ABNORMAL LOW (ref 150–400)
RBC: 3.82 MIL/uL — AB (ref 3.87–5.11)
RDW: 16.6 % — AB (ref 11.5–15.5)
WBC: 2.5 10*3/uL — ABNORMAL LOW (ref 4.0–10.5)

## 2014-12-30 LAB — CULTURE, BLOOD (ROUTINE X 2): Culture: NO GROWTH

## 2014-12-30 LAB — BLOOD GAS, ARTERIAL
ACID-BASE EXCESS: 4.9 mmol/L — AB (ref 0.0–2.0)
Bicarbonate: 30.6 mEq/L — ABNORMAL HIGH (ref 20.0–24.0)
Drawn by: 105551
FIO2: 0.35 %
O2 SAT: 96.8 %
PEEP: 5 cmH2O
Patient temperature: 37
RATE: 8 resp/min
TCO2: 28.7 mmol/L (ref 0–100)
VT: 500 mL
pCO2 arterial: 61.3 mmHg (ref 35.0–45.0)
pH, Arterial: 7.319 — ABNORMAL LOW (ref 7.350–7.450)
pO2, Arterial: 93.3 mmHg (ref 80.0–100.0)

## 2014-12-30 LAB — GLUCOSE, CAPILLARY
Glucose-Capillary: 110 mg/dL — ABNORMAL HIGH (ref 70–99)
Glucose-Capillary: 113 mg/dL — ABNORMAL HIGH (ref 70–99)
Glucose-Capillary: 123 mg/dL — ABNORMAL HIGH (ref 70–99)
Glucose-Capillary: 130 mg/dL — ABNORMAL HIGH (ref 70–99)
Glucose-Capillary: 130 mg/dL — ABNORMAL HIGH (ref 70–99)
Glucose-Capillary: 132 mg/dL — ABNORMAL HIGH (ref 70–99)
Glucose-Capillary: 134 mg/dL — ABNORMAL HIGH (ref 70–99)

## 2014-12-30 LAB — BASIC METABOLIC PANEL
ANION GAP: 5 (ref 5–15)
BUN: 26 mg/dL — ABNORMAL HIGH (ref 6–23)
CALCIUM: 8.5 mg/dL (ref 8.4–10.5)
CO2: 31 mmol/L (ref 19–32)
CREATININE: 0.32 mg/dL — AB (ref 0.50–1.10)
Chloride: 106 mEq/L (ref 96–112)
GFR calc Af Amer: >90 mL/min (ref >90)
GFR calc non Af Amer: >90 mL/min (ref >90)
GLUCOSE: 157 mg/dL — AB (ref 70–99)
Potassium: 3.9 mmol/L (ref 3.5–5.1)
Sodium: 142 mmol/L (ref 135–145)

## 2014-12-30 MED ORDER — FUROSEMIDE 10 MG/ML IJ SOLN
40.0000 mg | Freq: Two times a day (BID) | INTRAMUSCULAR | Status: DC
  Administered 2014-12-30 – 2014-12-31 (×4): 40 mg via INTRAVENOUS
  Filled 2014-12-30 (×4): qty 4

## 2014-12-30 MED ORDER — POTASSIUM CHLORIDE 10 MEQ/100ML IV SOLN
10.0000 meq | INTRAVENOUS | Status: AC
  Administered 2014-12-30 (×4): 10 meq via INTRAVENOUS
  Filled 2014-12-30 (×4): qty 100

## 2014-12-30 MED ORDER — SODIUM CHLORIDE 0.9 % IN NEBU
INHALATION_SOLUTION | RESPIRATORY_TRACT | Status: AC
  Filled 2014-12-30: qty 6

## 2014-12-30 NOTE — Progress Notes (Signed)
Attempted to wean on CPAP, however patient gets extremely agitated when sedation is cut down.

## 2014-12-30 NOTE — Progress Notes (Addendum)
Subjective: She remains intubated and on the ventilator. She still gets very agitated when she is weaned from her propofol.  Objective: Vital signs in last 24 hours: Temp:  [97 F (36.1 C)-99 F (37.2 C)] 99 F (37.2 C) (01/11 0400) Pulse Rate:  [47-67] 51 (01/11 0645) Resp:  [8-31] 14 (01/11 0645) BP: (95-133)/(38-65) 108/51 mmHg (01/11 0645) SpO2:  [93 %-98 %] 96 % (01/11 0645) FiO2 (%):  [35 %] 35 % (01/11 0452) Weight:  [99.6 kg (219 lb 9.3 oz)] 99.6 kg (219 lb 9.3 oz) (01/11 0500) Weight change: 0.7 kg (1 lb 8.7 oz) Last BM Date: 12/27/14  Intake/Output from previous day: 01/10 0701 - 01/11 0700 In: 4463.7 [I.V.:3253.7; NG/GT:810; IV Piggyback:400] Out: 2475 [Urine:2475]  PHYSICAL EXAM General appearance: She is intubated and sedated. She looks very call now. Resp: rhonchi bilaterally Cardio: regular rate and rhythm, S1, S2 normal, no murmur, click, rub or gallop GI: soft, non-tender; bowel sounds normal; no masses,  no organomegaly Extremities: extremities normal, atraumatic, no cyanosis or edema  Lab Results:  Results for orders placed or performed during the hospital encounter of 12/24/14 (from the past 48 hour(s))  Glucose, capillary     Status: Abnormal   Collection Time: 12/28/14  8:05 AM  Result Value Ref Range   Glucose-Capillary 114 (H) 70 - 99 mg/dL   Comment 1 Notify RN   Vancomycin, trough     Status: Abnormal   Collection Time: 12/28/14 10:37 AM  Result Value Ref Range   Vancomycin Tr 27.7 (HH) 10.0 - 20.0 ug/mL    Comment: REPEATED TO VERIFY CRITICAL RESULT CALLED TO, READ BACK BY AND VERIFIED WITH: SPANGLER,E AT 1146 ON 12/28/2014 BY MOSLEY,J   Glucose, capillary     Status: None   Collection Time: 12/28/14 11:45 AM  Result Value Ref Range   Glucose-Capillary 99 70 - 99 mg/dL   Comment 1 Notify RN   Glucose, capillary     Status: None   Collection Time: 12/28/14  4:40 PM  Result Value Ref Range   Glucose-Capillary 92 70 - 99 mg/dL   Comment 1  Notify RN   Glucose, capillary     Status: Abnormal   Collection Time: 12/28/14  8:12 PM  Result Value Ref Range   Glucose-Capillary 117 (H) 70 - 99 mg/dL   Comment 1 Notify RN   Glucose, capillary     Status: Abnormal   Collection Time: 12/29/14 12:01 AM  Result Value Ref Range   Glucose-Capillary 113 (H) 70 - 99 mg/dL   Comment 1 Notify RN   Glucose, capillary     Status: Abnormal   Collection Time: 12/29/14  4:08 AM  Result Value Ref Range   Glucose-Capillary 162 (H) 70 - 99 mg/dL   Comment 1 Notify RN   Triglycerides     Status: Abnormal   Collection Time: 12/29/14  5:27 AM  Result Value Ref Range   Triglycerides 1557 (H) <150 mg/dL    Comment: RESULTS CONFIRMED BY MANUAL DILUTION  Basic metabolic panel     Status: Abnormal   Collection Time: 12/29/14  5:27 AM  Result Value Ref Range   Sodium 135 135 - 145 mmol/L    Comment: Please note change in reference range.   Potassium 4.1 3.5 - 5.1 mmol/L    Comment: DELTA CHECK NOTED Please note change in reference range.    Chloride 100 96 - 112 mEq/L   CO2 30 19 - 32 mmol/L   Glucose, Bld  142 (H) 70 - 99 mg/dL   BUN 18 6 - 23 mg/dL   Creatinine, Ser <0.30 (L) 0.50 - 1.10 mg/dL   Calcium 7.9 (L) 8.4 - 10.5 mg/dL   GFR calc non Af Amer NOT CALCULATED >90 mL/min   GFR calc Af Amer NOT CALCULATED >90 mL/min    Comment: (NOTE) The eGFR has been calculated using the CKD EPI equation. This calculation has not been validated in all clinical situations. eGFR's persistently <90 mL/min signify possible Chronic Kidney Disease.    Anion gap 5 5 - 15  Blood gas, arterial     Status: Abnormal   Collection Time: 12/29/14  6:09 AM  Result Value Ref Range   FIO2 0.35 %   Delivery systems VENTILATOR    Mode PRESSURE REGULATED VOLUME CONTROL    VT 500 mL   Rate 8 resp/min   Peep/cpap 5.0 cm H20   pH, Arterial 7.319 (L) 7.350 - 7.450   pCO2 arterial 59.1 (HH) 35.0 - 45.0 mmHg    Comment: CRITICAL RESULT CALLED TO, READ BACK BY AND  VERIFIED WITH:  TO Candiss Norse RN AT 0610 12/29/2013 [place] RRT    pO2, Arterial 86.4 80.0 - 100.0 mmHg   Bicarbonate 29.5 (H) 20.0 - 24.0 mEq/L   Acid-Base Excess 3.8 (H) 0.0 - 2.0 mmol/L   O2 Saturation 95.8 %   Patient temperature 37.0    Collection site RADIAL    Drawn by COLLECTED BY RT    Sample type ARTERIAL    Allens test (pass/fail) PASS PASS  Glucose, capillary     Status: Abnormal   Collection Time: 12/29/14  7:36 AM  Result Value Ref Range   Glucose-Capillary 144 (H) 70 - 99 mg/dL   Comment 1 Notify RN   Triglycerides     Status: Abnormal   Collection Time: 12/29/14 11:36 AM  Result Value Ref Range   Triglycerides 169 (H) <150 mg/dL  Glucose, capillary     Status: Abnormal   Collection Time: 12/29/14 11:43 AM  Result Value Ref Range   Glucose-Capillary 153 (H) 70 - 99 mg/dL   Comment 1 Notify RN   Glucose, capillary     Status: Abnormal   Collection Time: 12/29/14  4:36 PM  Result Value Ref Range   Glucose-Capillary 133 (H) 70 - 99 mg/dL   Comment 1 Notify RN   Glucose, capillary     Status: Abnormal   Collection Time: 12/29/14  8:07 PM  Result Value Ref Range   Glucose-Capillary 122 (H) 70 - 99 mg/dL   Comment 1 Notify RN   Glucose, capillary     Status: Abnormal   Collection Time: 12/29/14 11:49 PM  Result Value Ref Range   Glucose-Capillary 133 (H) 70 - 99 mg/dL   Comment 1 Notify RN   Glucose, capillary     Status: Abnormal   Collection Time: 12/30/14  3:53 AM  Result Value Ref Range   Glucose-Capillary 130 (H) 70 - 99 mg/dL   Comment 1 Notify RN   Basic metabolic panel     Status: Abnormal   Collection Time: 12/30/14  5:12 AM  Result Value Ref Range   Sodium 142 135 - 145 mmol/L    Comment: DELTA CHECK NOTED Please note change in reference range.    Potassium 3.9 3.5 - 5.1 mmol/L    Comment: Please note change in reference range.   Chloride 106 96 - 112 mEq/L   CO2 31 19 - 32 mmol/L  Glucose, Bld 157 (H) 70 - 99 mg/dL   BUN 26 (H) 6 - 23 mg/dL    Creatinine, Ser 0.32 (L) 0.50 - 1.10 mg/dL   Calcium 8.5 8.4 - 10.5 mg/dL   GFR calc non Af Amer >90 >90 mL/min   GFR calc Af Amer >90 >90 mL/min    Comment: (NOTE) The eGFR has been calculated using the CKD EPI equation. This calculation has not been validated in all clinical situations. eGFR's persistently <90 mL/min signify possible Chronic Kidney Disease.    Anion gap 5 5 - 15  CBC with Differential     Status: Abnormal   Collection Time: 12/30/14  5:12 AM  Result Value Ref Range   WBC 2.5 (L) 4.0 - 10.5 K/uL   RBC 3.82 (L) 3.87 - 5.11 MIL/uL   Hemoglobin 10.3 (L) 12.0 - 15.0 g/dL   HCT 35.4 (L) 36.0 - 46.0 %   MCV 92.7 78.0 - 100.0 fL   MCH 27.0 26.0 - 34.0 pg   MCHC 29.1 (L) 30.0 - 36.0 g/dL   RDW 16.6 (H) 11.5 - 15.5 %   Platelets 132 (L) 150 - 400 K/uL   Neutrophils Relative % 82 (H) 43 - 77 %   Neutro Abs 2.0 1.7 - 7.7 K/uL   Lymphocytes Relative 12 12 - 46 %   Lymphs Abs 0.3 (L) 0.7 - 4.0 K/uL   Monocytes Relative 6 3 - 12 %   Monocytes Absolute 0.2 0.1 - 1.0 K/uL   Eosinophils Relative 0 0 - 5 %   Eosinophils Absolute 0.0 0.0 - 0.7 K/uL   Basophils Relative 0 0 - 1 %   Basophils Absolute 0.0 0.0 - 0.1 K/uL  Blood gas, arterial     Status: Abnormal   Collection Time: 12/30/14  5:31 AM  Result Value Ref Range   FIO2 0.35 %   Delivery systems VENTILATOR    Mode PRESSURE REGULATED VOLUME CONTROL    VT 500 mL   Rate 8 resp/min   Peep/cpap 5.0 cm H20   pH, Arterial 7.319 (L) 7.350 - 7.450   pCO2 arterial 61.3 (HH) 35.0 - 45.0 mmHg    Comment: CRITICAL RESULT CALLED TO, READ BACK BY AND VERIFIED WITH:  TO NIELSON T RN AT 0535 12/30/2014 [facility] RRT    pO2, Arterial 93.3 80.0 - 100.0 mmHg   Bicarbonate 30.6 (H) 20.0 - 24.0 mEq/L   TCO2 28.7 0 - 100 mmol/L   Acid-Base Excess 4.9 (H) 0.0 - 2.0 mmol/L   O2 Saturation 96.8 %   Patient temperature 37.0    Collection site RADIAL    Drawn by [id number]    Sample type ARTERIAL    Allens test (pass/fail) PASS PASS     ABGS  Recent Labs  12/30/14 0531  PHART 7.319*  PO2ART 93.3  TCO2 28.7  HCO3 30.6*   CULTURES Recent Results (from the past 240 hour(s))  Culture, blood (routine x 2)     Status: None (Preliminary result)   Collection Time: 12/24/14  3:30 PM  Result Value Ref Range Status   Specimen Description BLOOD RIGHT WRIST DRAWN BY RN [phone]  Final   Special Requests BOTTLES DRAWN AEROBIC AND ANAEROBIC 6CC  Final   Culture NO GROWTH 4 DAYS  Final   Report Status PENDING  Incomplete  Culture, blood (routine x 2)     Status: None   Collection Time: 12/24/14  3:30 PM  Result Value Ref Range Status   Specimen Description  BLOOD LEFT HAND DRAWN BY RN [phone]  Final   Special Requests BOTTLES DRAWN AEROBIC AND ANAEROBIC 6CC   Final   Culture   Final    STAPHYLOCOCCUS SPECIES (COAGULASE NEGATIVE) Note: THE SIGNIFICANCE OF ISOLATING THIS ORGANISM FROM A SINGLE VENIPUNCTURE CANNOT BE PREDICTED WITHOUT FURTHER CLINICAL AND CULTURE CORRELATION. SUSCEPTIBILITIES AVAILABLE ONLY ON REQUEST. Note: Gram Stain Report Called to,Read Back By and Verified With: PHILLIPS C. AT 1610 ON 12/25/14 BY BAUGHAM M. Performed at [facility] Performed at [facility]    Report Status 12/27/2014 FINAL  Final  Urine culture     Status: None   Collection Time: 12/24/14  3:40 PM  Result Value Ref Range Status   Specimen Description URINE, CATHETERIZED  Final   Special Requests NONE  Final   Colony Count NO GROWTH Performed at [facility]   Final   Culture NO GROWTH Performed at [facility]   Final   Report Status 12/25/2014 FINAL  Final  MRSA PCR Screening     Status: Abnormal   Collection Time: 12/24/14  9:20 PM  Result Value Ref Range Status   MRSA by PCR POSITIVE (A) NEGATIVE Final    Comment:        The GeneXpert MRSA Assay (FDA approved for NASAL specimens only), is one component of a comprehensive MRSA colonization surveillance program. It is not intended to diagnose  MRSA infection nor to guide or [place] [address] BY Mikel Cella K    Studies/Results: Dg Chest Port 1 View  12/29/2014   CLINICAL DATA:  74 year old female with respiratory failure.  EXAM: PORTABLE CHEST - 1 VIEW  COMPARISON:  Chest x-ray 12/28/2014.  FINDINGS: An endotracheal tube is in place with tip 5.0 cm above the carina. A nasogastric tube is seen extending into the stomach, however, the tip of the nasogastric tube extends below the lower margin of the image. Lung volumes are low. Bibasilar opacities compatible with areas of subsegmental atelectasis and small bilateral pleural effusions. There is cephalization of the pulmonary vasculature and slight indistinctness of the interstitial markings suggestive of mild pulmonary edema. Mild to moderate cardiomegaly. The patient is rotated to the right on today's exam, resulting in distortion of the mediastinal contours and reduced diagnostic sensitivity and specificity for mediastinal pathology.  IMPRESSION: 1. Support apparatus, as above. 2. The appearance of the chest again suggests underlying congestive heart failure, as above.   Electronically Signed   By: Vinnie Langton M.D.   On: 12/29/2014 09:21   Dg Chest Port 1 View  12/28/2014   CLINICAL DATA:  OG-tube placement  EXAM: PORTABLE CHEST - 1 VIEW  COMPARISON:  12/28/2014 at 0725 hr  FINDINGS: Cardiomegaly with pulmonary vascular congestion and suspected mild interstitial edema. Mild patchy bilateral lower lobe opacities, likely atelectasis. Possible small left pleural effusion. No pneumothorax.  Endotracheal tube terminates 1.5 cm above the carina.  Enteric tube terminates in the gastric cardia.  IMPRESSION: Cardiomegaly with suspected mild interstitial edema and possible small left pleural effusion.  Endotracheal tube terminates 1.5 cm above the carina.  Enteric tube terminates in the gastric cardia.   Electronically Signed   By: Julian Hy M.D.   On: 12/28/2014  14:54    Medications:  Prior to Admission:  Prescriptions prior to admission  Medication Sig Dispense Refill Last Dose  . acetaminophen (TYLENOL) 325 MG tablet Take 2 tablets (650 mg total) by mouth every 6 (six) hours as needed for mild  pain (or Fever >/= 101).   unknown  . albuterol (PROVENTIL HFA;VENTOLIN HFA) 108 (90 BASE) MCG/ACT inhaler Inhale 2 puffs into the lungs every 4 (four) hours as needed. Shortness of breath 1 Inhaler 12 unknown  . albuterol (PROVENTIL) (2.5 MG/3ML) 0.083% nebulizer solution Take 2.5 mg by nebulization 4 (four) times daily.   12/24/2014 at 1200  . ALPRAZolam (XANAX) 1 MG tablet Take 1 mg by mouth 4 (four) times daily.    12/24/2014 at 1400  . Amino Acids-Protein Hydrolys (FEEDING SUPPLEMENT, PRO-STAT SUGAR FREE 64,) LIQD Take 30 mLs by mouth 2 (two) times daily with a meal.   12/24/2014 at 800a  . aspirin 325 MG tablet Take 1 tablet (325 mg total) by mouth daily. 30 tablet 12 12/24/2014 at [place]  . atorvastatin (LIPITOR) 40 MG tablet Take 40 mg by mouth daily.   12/24/2014 at [place]  . baclofen (LIORESAL) 10 MG tablet Take 5 mg by mouth 3 (three) times daily.   12/24/2014 at 1400  . cefTRIAXone (ROCEPHIN) 1 G injection Inject 1 g into the muscle once. Last administered on 12/23/2014 at 22:00 IM to right hip   12/23/2014  . cholecalciferol (VITAMIN D) 1000 UNITS tablet Take 1,000 Units by mouth daily.   12/24/2014 at [place]  . clopidogrel (PLAVIX) 75 MG tablet Take 1 tablet (75 mg total) by mouth daily with breakfast.   12/24/2014 at [place]  . Cranberry 450 MG CAPS Take 1 capsule by mouth daily.   12/24/2014 at 900a  . guaiFENesin (MUCINEX) 600 MG 12 hr tablet Take 600 mg by mouth 2 (two) times daily.   12/24/2014 at 800a  . haloperidol (HALDOL) 0.5 MG tablet Take 0.5 mg by mouth 2 (two) times daily.    12/24/2014 at 900a  . levothyroxine (SYNTHROID, LEVOTHROID) 88 MCG tablet Take 1 tablet (88 mcg total) by mouth daily before breakfast. 30 tablet 12 12/24/2014 at 600a  . loperamide (IMODIUM) 2 MG  capsule Take 1 capsule (2 mg total) by mouth as needed for diarrhea or loose stools. 30 capsule 0 unknown  . loratadine (CLARITIN) 10 MG tablet Take 10 mg by mouth daily.   12/24/2014 at [place]  . nitroGLYCERIN (NITROSTAT) 0.4 MG SL tablet Place 1 tablet (0.4 mg total) under the tongue every 5 (five) minutes as needed for chest pain. 25 tablet 12 unknown  . pantoprazole (PROTONIX) 40 MG tablet Take 1 tablet (40 mg total) by mouth daily. 30 tablet 12 12/24/2014 at 600a  . polyethylene glycol (MIRALAX / GLYCOLAX) packet Take 17 g by mouth daily.   12/24/2014 at 900a  . potassium chloride SA (K-DUR,KLOR-CON) 20 MEQ tablet Take 10 mEq by mouth daily.    12/24/2014 at [place]  . predniSONE (DELTASONE) 5 MG tablet Take 5 mg by mouth daily with breakfast.   12/24/2014 at [place]  . ranolazine (RANEXA) 500 MG 12 hr tablet Take 1 tablet (500 mg total) by mouth 2 (two) times daily. 60 tablet 3 12/24/2014 at [place]  . saccharomyces boulardii (FLORASTOR) 250 MG capsule Take 250 mg by mouth 2 (two) times daily.   12/24/2014 at 800a  . sertraline (ZOLOFT) 100 MG tablet Take 1 tablet (100 mg total) by mouth daily.   12/24/2014 at 900a  . temazepam (RESTORIL) 7.5 MG capsule Take 7.5 mg by mouth at bedtime as needed for sleep.   unknown  . traMADol (ULTRAM) 50 MG tablet Take 50 mg by mouth every 6 (six) hours as needed for moderate pain.   unknown  .  furosemide (LASIX) 20 MG tablet Take 20 mg by mouth daily.   Taking  . trolamine salicylate (ASPERCREME) 10 % cream Apply topically as needed for muscle pain. (Patient not taking: Reported on 12/24/2014) 85 g 0 Taking   Scheduled: . albuterol  2.5 mg Nebulization QID  . antiseptic oral rinse  7 mL Mouth Rinse QID  . ceFEPime (MAXIPIME) IV  1 g Intravenous 3 times per day  . chlorhexidine  15 mL Mouth Rinse BID  . enoxaparin (LOVENOX) injection  40 mg Subcutaneous Q24H  . feeding supplement (PRO-STAT SUGAR FREE 64)  60 mL Oral TID WC  . feeding supplement (VITAL HIGH PROTEIN)  1,000 mL Per  Tube Q24H  . insulin aspart  0-20 Units Subcutaneous 6 times per day  . methylPREDNISolone (SOLU-MEDROL) injection  40 mg Intravenous Q6H  . pantoprazole (PROTONIX) IV  40 mg Intravenous Q24H  . sodium chloride  10-40 mL Intracatheter Q12H  . vancomycin  1,250 mg Intravenous Q24H   Continuous: . sodium chloride 75 mL/hr at 12/29/14 1936  . fentaNYL infusion INTRAVENOUS 200 mcg/hr (12/29/14 1936)  . propofol 70 mcg/kg/min (12/30/14 0532)   FRT:MYTRZN chloride, acetaminophen, fentaNYL, midazolam, ondansetron (ZOFRAN) IV, sodium chloride  Assesment: She has acute on chronic hypercapnic respiratory failure. She has been encephalopathic and still very agitated. She was septic on admission but without a definite cause. She does have some abnormalities on chest x-ray that could be pneumonia. She was dehydrated on admission but may have some volume overload now. Active Problems:   Morbid obesity   Cerebral palsy   Hypertension   Sepsis   Acute and chronic respiratory failure with hypercapnia   Encephalopathy, metabolic   UTI (urinary tract infection)    Plan: Continue current medications. Add Lasix area I had planned potential extubation today but since her chest x-ray looks more like volume overload I think we should try to optimize her fluid status before we extubate    LOS: 6 days   Larysa Pall L 12/30/2014, 7:56 AM

## 2014-12-31 ENCOUNTER — Ambulatory Visit (HOSPITAL_COMMUNITY): Payer: Medicare Other

## 2014-12-31 ENCOUNTER — Ambulatory Visit (HOSPITAL_COMMUNITY): Payer: Medicare Other | Admitting: Oncology

## 2014-12-31 ENCOUNTER — Other Ambulatory Visit (HOSPITAL_COMMUNITY): Payer: Medicare Other

## 2014-12-31 ENCOUNTER — Inpatient Hospital Stay (HOSPITAL_COMMUNITY): Payer: Medicare Other

## 2014-12-31 LAB — GLUCOSE, CAPILLARY
GLUCOSE-CAPILLARY: 102 mg/dL — AB (ref 70–99)
GLUCOSE-CAPILLARY: 119 mg/dL — AB (ref 70–99)
GLUCOSE-CAPILLARY: 122 mg/dL — AB (ref 70–99)
GLUCOSE-CAPILLARY: 127 mg/dL — AB (ref 70–99)
GLUCOSE-CAPILLARY: 95 mg/dL (ref 70–99)
Glucose-Capillary: 105 mg/dL — ABNORMAL HIGH (ref 70–99)

## 2014-12-31 LAB — BLOOD GAS, ARTERIAL
ACID-BASE EXCESS: 13 mmol/L — AB (ref 0.0–2.0)
Acid-Base Excess: 12.7 mmol/L — ABNORMAL HIGH (ref 0.0–2.0)
Bicarbonate: 38.4 mEq/L — ABNORMAL HIGH (ref 20.0–24.0)
Bicarbonate: 38 mEq/L — ABNORMAL HIGH (ref 20.0–24.0)
DRAWN BY: 21310
DRAWN BY: 23430
FIO2: 35 %
FIO2: 40 %
MECHVT: 500 mL
MODE: POSITIVE
O2 SAT: 95.9 %
O2 Saturation: 95.2 %
PATIENT TEMPERATURE: 37
PCO2 ART: 67.3 mmHg — AB (ref 35.0–45.0)
PEEP/CPAP: 5 cmH2O
PEEP: 5 cmH2O
Patient temperature: 37
Pressure support: 5 cmH2O
RATE: 8 resp/min
TCO2: 35.2 mmol/L (ref 0–100)
TCO2: 33.8 mmol/L (ref 0–100)
pCO2 arterial: 57.4 mmHg (ref 35.0–45.0)
pH, Arterial: 7.374 (ref 7.350–7.450)
pH, Arterial: 7.436 (ref 7.350–7.450)
pO2, Arterial: 84.1 mmHg (ref 80.0–100.0)
pO2, Arterial: 75.5 mmHg — ABNORMAL LOW (ref 80.0–100.0)

## 2014-12-31 LAB — BASIC METABOLIC PANEL
Anion gap: 6 (ref 5–15)
BUN: 31 mg/dL — AB (ref 6–23)
CHLORIDE: 99 meq/L (ref 96–112)
CO2: 38 mmol/L — AB (ref 19–32)
Calcium: 8.4 mg/dL (ref 8.4–10.5)
Creatinine, Ser: 0.3 mg/dL — ABNORMAL LOW (ref 0.50–1.10)
GFR calc Af Amer: >90 mL/min (ref >90)
GFR calc non Af Amer: >90 mL/min (ref >90)
Glucose, Bld: 128 mg/dL — ABNORMAL HIGH (ref 70–99)
POTASSIUM: 3.6 mmol/L (ref 3.5–5.1)
SODIUM: 143 mmol/L (ref 135–145)

## 2014-12-31 LAB — CBC WITH DIFFERENTIAL/PLATELET
Basophils Absolute: 0 10*3/uL (ref 0.0–0.1)
Basophils Relative: 0 % (ref 0–1)
Eosinophils Absolute: 0 10*3/uL (ref 0.0–0.7)
Eosinophils Relative: 0 % (ref 0–5)
HCT: 31.3 % — ABNORMAL LOW (ref 36.0–46.0)
Hemoglobin: 9.8 g/dL — ABNORMAL LOW (ref 12.0–15.0)
LYMPHS PCT: 13 % (ref 12–46)
Lymphs Abs: 0.3 10*3/uL — ABNORMAL LOW (ref 0.7–4.0)
MCH: 29 pg (ref 26.0–34.0)
MCHC: 31.3 g/dL (ref 30.0–36.0)
MCV: 92.6 fL (ref 78.0–100.0)
Monocytes Absolute: 0.2 10*3/uL (ref 0.1–1.0)
Monocytes Relative: 9 % (ref 3–12)
NEUTROS ABS: 2.1 10*3/uL (ref 1.7–7.7)
Neutrophils Relative %: 78 % — ABNORMAL HIGH (ref 43–77)
Platelets: 130 10*3/uL — ABNORMAL LOW (ref 150–400)
RBC: 3.38 MIL/uL — ABNORMAL LOW (ref 3.87–5.11)
RDW: 16.4 % — AB (ref 11.5–15.5)
WBC: 2.6 10*3/uL — AB (ref 4.0–10.5)

## 2014-12-31 MED ORDER — HALOPERIDOL 0.5 MG PO TABS
0.5000 mg | ORAL_TABLET | Freq: Two times a day (BID) | ORAL | Status: DC
  Administered 2014-12-31 – 2015-01-05 (×10): 0.5 mg via ORAL
  Filled 2014-12-31 (×13): qty 1

## 2014-12-31 MED ORDER — ALBUTEROL SULFATE (2.5 MG/3ML) 0.083% IN NEBU
2.5000 mg | INHALATION_SOLUTION | RESPIRATORY_TRACT | Status: DC | PRN
  Administered 2014-12-31: 2.5 mg via RESPIRATORY_TRACT

## 2014-12-31 MED ORDER — ALPRAZOLAM 0.5 MG PO TABS
1.0000 mg | ORAL_TABLET | Freq: Three times a day (TID) | ORAL | Status: DC | PRN
  Administered 2014-12-31 – 2015-01-01 (×3): 1 mg via ORAL
  Filled 2014-12-31 (×3): qty 2

## 2014-12-31 MED ORDER — ALBUTEROL SULFATE (2.5 MG/3ML) 0.083% IN NEBU
INHALATION_SOLUTION | RESPIRATORY_TRACT | Status: AC
  Administered 2014-12-31: 2.5 mg via RESPIRATORY_TRACT
  Filled 2014-12-31: qty 3

## 2014-12-31 MED ORDER — ALPRAZOLAM 0.5 MG PO TABS: 1.0000 mg | ORAL_TABLET | Freq: Four times a day (QID) | ORAL | Status: DC

## 2014-12-31 NOTE — Progress Notes (Signed)
Subjective: She remains intubated and on the ventilator. She was 4 [name] negative yesterday with Lasix.  Objective: Vital signs in last 24 hours: Temp:  [98.7 F (37.1 C)-99.4 F (37.4 C)] 98.8 F (37.1 C) (01/12 0400) Pulse Rate:  [40-77] 61 (01/12 0515) Resp:  [7-28] 16 (01/12 0515) BP: (99-128)/(45-64) 128/56 mmHg (01/12 0445) SpO2:  [91 %-96 %] 94 % (01/12 0745) FiO2 (%):  [35 %] 35 % (01/12 0745) Weight:  [100 kg (220 lb 7.4 oz)] 100 kg (220 lb 7.4 oz) (01/12 0500) Weight change: 0.4 kg (14.1 oz) Last BM Date: 12/27/14  Intake/Output from previous day: 01/11 0701 - 01/12 0700 In: 2753.6 [I.V.:1628.6; NG/GT:605; IV Piggyback:400] Out: 7025 [Urine:7025]  PHYSICAL EXAM General appearance: Intubated sedated and calm when she is sedated Resp: rales bilaterally and rhonchi bilaterally Cardio: regular rate and rhythm, S1, S2 normal, no murmur, click, rub or gallop GI: soft, non-tender; bowel sounds normal; no masses,  no organomegaly Extremities: extremities normal, atraumatic, no cyanosis or edema  Lab Results:  Results for orders placed or performed during the hospital encounter of 12/24/14 (from the past 48 hour(s))  Triglycerides     Status: Abnormal   Collection Time: 12/29/14 11:36 AM  Result Value Ref Range   Triglycerides 169 (H) <150 mg/dL  Glucose, capillary     Status: Abnormal   Collection Time: 12/29/14 11:43 AM  Result Value Ref Range   Glucose-Capillary 153 (H) 70 - 99 mg/dL   Comment 1 Notify RN   Glucose, capillary     Status: Abnormal   Collection Time: 12/29/14  4:36 PM  Result Value Ref Range   Glucose-Capillary 133 (H) 70 - 99 mg/dL   Comment 1 Notify RN   Glucose, capillary     Status: Abnormal   Collection Time: 12/29/14  8:07 PM  Result Value Ref Range   Glucose-Capillary 122 (H) 70 - 99 mg/dL   Comment 1 Notify RN   Glucose, capillary     Status: Abnormal   Collection Time: 12/29/14 11:49 PM  Result Value Ref Range   Glucose-Capillary 133 (H)  70 - 99 mg/dL   Comment 1 Notify RN   Glucose, capillary     Status: Abnormal   Collection Time: 12/30/14  3:53 AM  Result Value Ref Range   Glucose-Capillary 130 (H) 70 - 99 mg/dL   Comment 1 Notify RN   Basic metabolic panel     Status: Abnormal   Collection Time: 12/30/14  5:12 AM  Result Value Ref Range   Sodium 142 135 - 145 mmol/[name]    Comment: DELTA CHECK NOTED Please note change in reference range.    Potassium 3.9 3.5 - 5.1 mmol/[name]    Comment: Please note change in reference range.   Chloride 106 96 - 112 mEq/[name]   CO2 31 19 - 32 mmol/[name]   Glucose, Bld 157 (H) 70 - 99 mg/dL   BUN 26 (H) 6 - 23 mg/dL   Creatinine, Ser 0.32 ([name]) 0.50 - 1.10 mg/dL   Calcium 8.5 8.4 - 10.5 mg/dL   GFR calc non Af Amer >90 >90 mL/min   GFR calc Af Amer >90 >90 mL/min    Comment: (NOTE) The eGFR has been calculated using the CKD EPI equation. This calculation has not been validated in all clinical situations. eGFR's persistently <90 mL/min signify possible Chronic Kidney Disease.    Anion gap 5 5 - 15  CBC with Differential     Status: Abnormal  Collection Time: 12/30/14  5:12 AM  Result Value Ref Range   WBC 2.5 ([name]) 4.0 - 10.5 K/uL   RBC 3.82 ([name]) 3.87 - 5.11 MIL/uL   Hemoglobin 10.3 ([name]) 12.0 - 15.0 g/dL   HCT 35.4 ([name]) 36.0 - 46.0 %   MCV 92.7 78.0 - 100.0 fL   MCH 27.0 26.0 - 34.0 pg   MCHC 29.1 ([name]) 30.0 - 36.0 g/dL   RDW 16.6 (H) 11.5 - 15.5 %   Platelets 132 ([name]) 150 - 400 K/uL   Neutrophils Relative % 82 (H) 43 - 77 %   Neutro Abs 2.0 1.7 - 7.7 K/uL   Lymphocytes Relative 12 12 - 46 %   Lymphs Abs 0.3 ([name]) 0.7 - 4.0 K/uL   Monocytes Relative 6 3 - 12 %   Monocytes Absolute 0.2 0.1 - 1.0 K/uL   Eosinophils Relative 0 0 - 5 %   Eosinophils Absolute 0.0 0.0 - 0.7 K/uL   Basophils Relative 0 0 - 1 %   Basophils Absolute 0.0 0.0 - 0.1 K/uL  Blood gas, arterial     Status: Abnormal   Collection Time: 12/30/14  5:31 AM  Result Value Ref Range   FIO2 0.35 %   Delivery systems  VENTILATOR    Mode PRESSURE REGULATED VOLUME CONTROL    VT 500 mL   Rate 8 resp/min   Peep/cpap 5.0 cm H20   pH, Arterial 7.319 ([name]) 7.350 - 7.450   pCO2 arterial 61.3 (HH) 35.0 - 45.0 mmHg    Comment: CRITICAL RESULT CALLED TO, READ BACK BY AND VERIFIED WITH:  TO NIELSON T RN AT 0535 12/30/2014 LANC RRT    pO2, Arterial 93.3 80.0 - 100.0 mmHg   Bicarbonate 30.6 (H) 20.0 - 24.0 mEq/[name]   TCO2 28.7 0 - 100 mmol/[name]   Acid-Base Excess 4.9 (H) 0.0 - 2.0 mmol/[name]   O2 Saturation 96.8 %   Patient temperature 37.0    Collection site RADIAL    Drawn by [id number]    Sample type ARTERIAL    Allens test (pass/fail) PASS PASS  Glucose, capillary     Status: Abnormal   Collection Time: 12/30/14  8:12 AM  Result Value Ref Range   Glucose-Capillary 113 (H) 70 - 99 mg/dL   Comment 1 Documented in Chart    Comment 2 Notify RN   Glucose, capillary     Status: Abnormal   Collection Time: 12/30/14  9:09 AM  Result Value Ref Range   Glucose-Capillary 134 (H) 70 - 99 mg/dL  Glucose, capillary     Status: Abnormal   Collection Time: 12/30/14 11:50 AM  Result Value Ref Range   Glucose-Capillary 110 (H) 70 - 99 mg/dL   Comment 1 Documented in Chart    Comment 2 Notify RN   Glucose, capillary     Status: Abnormal   Collection Time: 12/30/14  5:01 PM  Result Value Ref Range   Glucose-Capillary 132 (H) 70 - 99 mg/dL   Comment 1 Documented in Chart    Comment 2 Notify RN   Glucose, capillary     Status: Abnormal   Collection Time: 12/30/14  7:36 PM  Result Value Ref Range   Glucose-Capillary 130 (H) 70 - 99 mg/dL  Glucose, capillary     Status: Abnormal   Collection Time: 12/30/14 11:52 PM  Result Value Ref Range   Glucose-Capillary 123 (H) 70 - 99 mg/dL  Glucose, capillary     Status: Abnormal   Collection  Time: 12/31/14  4:23 AM  Result Value Ref Range   Glucose-Capillary 105 (H) 70 - 99 mg/dL  Basic metabolic panel     Status: Abnormal   Collection Time: 12/31/14  4:35 AM  Result Value Ref Range    Sodium 143 135 - 145 mmol/[name]    Comment: Please note change in reference range.   Potassium 3.6 3.5 - 5.1 mmol/[name]    Comment: Please note change in reference range.   Chloride 99 96 - 112 mEq/[name]   CO2 38 (H) 19 - 32 mmol/[name]   Glucose, Bld 128 (H) 70 - 99 mg/dL   BUN 31 (H) 6 - 23 mg/dL   Creatinine, Ser 0.30 ([name]) 0.50 - 1.10 mg/dL   Calcium 8.4 8.4 - 10.5 mg/dL   GFR calc non Af Amer >90 >90 mL/min   GFR calc Af Amer >90 >90 mL/min    Comment: (NOTE) The eGFR has been calculated using the CKD EPI equation. This calculation has not been validated in all clinical situations. eGFR's persistently <90 mL/min signify possible Chronic Kidney Disease.    Anion gap 6 5 - 15  CBC with Differential     Status: Abnormal   Collection Time: 12/31/14  4:35 AM  Result Value Ref Range   WBC 2.6 ([name]) 4.0 - 10.5 K/uL   RBC 3.38 ([name]) 3.87 - 5.11 MIL/uL   Hemoglobin 9.8 ([name]) 12.0 - 15.0 g/dL   HCT 31.3 ([name]) 36.0 - 46.0 %   MCV 92.6 78.0 - 100.0 fL   MCH 29.0 26.0 - 34.0 pg   MCHC 31.3 30.0 - 36.0 g/dL   RDW 16.4 (H) 11.5 - 15.5 %   Platelets 130 ([name]) 150 - 400 K/uL   Neutrophils Relative % 78 (H) 43 - 77 %   Neutro Abs 2.1 1.7 - 7.7 K/uL   Lymphocytes Relative 13 12 - 46 %   Lymphs Abs 0.3 ([name]) 0.7 - 4.0 K/uL   Monocytes Relative 9 3 - 12 %   Monocytes Absolute 0.2 0.1 - 1.0 K/uL   Eosinophils Relative 0 0 - 5 %   Eosinophils Absolute 0.0 0.0 - 0.7 K/uL   Basophils Relative 0 0 - 1 %   Basophils Absolute 0.0 0.0 - 0.1 K/uL  Blood gas, arterial     Status: Abnormal   Collection Time: 12/31/14  6:00 AM  Result Value Ref Range   FIO2 35.00 %   Delivery systems VENTILATOR    Mode PRESSURE REGULATED VOLUME CONTROL    VT 500 mL   Rate 8.0 resp/min   Peep/cpap 5.0 cm H20   pH, Arterial 7.374 7.350 - 7.450   pCO2 arterial 67.3 (HH) 35.0 - 45.0 mmHg    Comment: CRITICAL RESULT CALLED TO, READ BACK BY AND VERIFIED WITH: WAGONER,R. RN AT 3845 12/31/14 ANDERSON,S RRT    pO2, Arterial 84.1 80.0 - 100.0  mmHg   Bicarbonate 38.4 (H) 20.0 - 24.0 mEq/[name]   TCO2 35.2 0 - 100 mmol/[name]   Acid-Base Excess 12.7 (H) 0.0 - 2.0 mmol/[name]   O2 Saturation 95.9 %   Patient temperature 37.0    Collection site LEFT RADIAL    Drawn by [id number]    Sample type ARTERIAL    Allens test (pass/fail) PASS PASS    ABGS  Recent Labs  12/31/14 0600  PHART 7.374  PO2ART 84.1  TCO2 35.2  HCO3 38.4*   CULTURES Recent Results (from the past 240 hour(s))  Culture, blood (routine x 2)  Status: None   Collection Time: 12/24/14  3:30 PM  Result Value Ref Range Status   Specimen Description BLOOD RIGHT WRIST DRAWN BY RN [phone]  Final   Special Requests BOTTLES DRAWN AEROBIC AND ANAEROBIC 6CC  Final   Culture NO GROWTH 6 DAYS  Final   Report Status 12/30/2014 FINAL  Final  Culture, blood (routine x 2)     Status: None   Collection Time: 12/24/14  3:30 PM  Result Value Ref Range Status   Specimen Description BLOOD LEFT HAND DRAWN BY RN [phone]  Final   Special Requests BOTTLES DRAWN AEROBIC AND ANAEROBIC 6CC   Final   Culture   Final    STAPHYLOCOCCUS SPECIES (COAGULASE NEGATIVE) Note: THE SIGNIFICANCE OF ISOLATING THIS ORGANISM FROM A SINGLE VENIPUNCTURE CANNOT BE PREDICTED WITHOUT FURTHER CLINICAL AND CULTURE CORRELATION. SUSCEPTIBILITIES AVAILABLE ONLY ON REQUEST. Note: Gram Stain Report Called to,Read Back By and Verified With: PHILLIPS C. AT 1610 ON 12/25/14 BY BAUGHAM M. Performed at [facility] Performed at [facility]    Report Status 12/27/2014 FINAL  Final  Urine culture     Status: None   Collection Time: 12/24/14  3:40 PM  Result Value Ref Range Status   Specimen Description URINE, CATHETERIZED  Final   Special Requests NONE  Final   [place] Count NO GROWTH Performed at [facility]   Final   Culture NO GROWTH Performed at [facility]   Final   Report Status 12/25/2014 FINAL  Final  MRSA PCR Screening     Status: Abnormal   Collection Time: 12/24/14  9:20 PM   Result Value Ref Range Status   MRSA by PCR POSITIVE (A) NEGATIVE Final    Comment:        The GeneXpert MRSA Assay (FDA approved for NASAL specimens only), is one component of a comprehensive MRSA colonization surveillance program. It is not intended to diagnose MRSA infection nor to guide [place] [address] BY Mikel Cella K    Studies/Results: Dg Chest Port 1 View  12/31/2014   CLINICAL DATA:  Intubation.  EXAM: PORTABLE CHEST - 1 VIEW  COMPARISON:  12/29/2014.  FINDINGS: Endotracheal tube and NG tube in stable position. Right PICC line in stable position . Persistent cardiomegaly pulmonary venous congestion interstitial prominence with small bilateral pleural effusions consistent with congestive heart failure. Superimposed pneumonitis cannot be excluded. No pneumothorax.  IMPRESSION: 1. Lines and tubes in stable position. 2. Persistent changes of congestive heart failure or from interstitial edema and bilateral small pleural effusions. Superimposed pneumonia cannot be excluded.   Electronically Signed   By: Marcello Moores  Register   On: 12/31/2014 07:22    Medications:  Prior to Admission:  Prescriptions prior to admission  Medication Sig Dispense Refill Last Dose  . acetaminophen (TYLENOL) 325 MG tablet Take 2 tablets (650 mg total) by mouth every 6 (six) hours as needed for mild pain (or Fever >/= 101).   unknown  . albuterol (PROVENTIL HFA;VENTOLIN HFA) 108 (90 BASE) MCG/ACT inhaler Inhale 2 puffs into the lungs every 4 (four) hours as needed. Shortness of breath 1 Inhaler 12 unknown  . albuterol (PROVENTIL) (2.5 MG/3ML) 0.083% nebulizer solution Take 2.5 mg by nebulization 4 (four) times daily.   12/24/2014 at 1200  . ALPRAZolam (XANAX) 1 MG tablet Take 1 mg by mouth 4 (four) times daily.    12/24/2014 at 1400  . Amino Acids-Protein Hydrolys (FEEDING SUPPLEMENT, PRO-STAT SUGAR FREE 64,) LIQD Take  30 mLs by mouth 2 (two) times daily with a meal.   12/24/2014 at [place]   . aspirin 325 MG tablet Take 1 tablet (325 mg total) by mouth daily. 30 tablet 12 12/24/2014 at [place]  . atorvastatin (LIPITOR) 40 MG tablet Take 40 mg by mouth daily.   12/24/2014 at [place]  . baclofen (LIORESAL) 10 MG tablet Take 5 mg by mouth 3 (three) times daily.   12/24/2014 at 1400  . cefTRIAXone (ROCEPHIN) 1 G injection Inject 1 g into the muscle once. Last administered on 12/23/2014 at 22:00 IM to right hip   12/23/2014  . cholecalciferol (VITAMIN D) 1000 UNITS tablet Take 1,000 Units by mouth daily.   12/24/2014 at [place]  . clopidogrel (PLAVIX) 75 MG tablet Take 1 tablet (75 mg total) by mouth daily with breakfast.   12/24/2014 at [place]  . Cranberry 450 MG CAPS Take 1 capsule by mouth daily.   12/24/2014 at 900a  . guaiFENesin (MUCINEX) 600 MG 12 hr tablet Take 600 mg by mouth 2 (two) times daily.   12/24/2014 at 800a  . haloperidol (HALDOL) 0.5 MG tablet Take 0.5 mg by mouth 2 (two) times daily.    12/24/2014 at 900a  . levothyroxine (SYNTHROID, LEVOTHROID) 88 MCG tablet Take 1 tablet (88 mcg total) by mouth daily before breakfast. 30 tablet 12 12/24/2014 at 600a  . loperamide (IMODIUM) 2 MG capsule Take 1 capsule (2 mg total) by mouth as needed for diarrhea or loose stools. 30 capsule 0 unknown  . loratadine (CLARITIN) 10 MG tablet Take 10 mg by mouth daily.   12/24/2014 at [place]  . nitroGLYCERIN (NITROSTAT) 0.4 MG SL tablet Place 1 tablet (0.4 mg total) under the tongue every 5 (five) minutes as needed for chest pain. 25 tablet 12 unknown  . pantoprazole (PROTONIX) 40 MG tablet Take 1 tablet (40 mg total) by mouth daily. 30 tablet 12 12/24/2014 at 600a  . polyethylene glycol (MIRALAX / GLYCOLAX) packet Take 17 g by mouth daily.   12/24/2014 at 900a  . potassium chloride SA (K-DUR,KLOR-CON) 20 MEQ tablet Take 10 mEq by mouth daily.    12/24/2014 at [place]  . predniSONE (DELTASONE) 5 MG tablet Take 5 mg by mouth daily with breakfast.   12/24/2014 at [place]  . ranolazine (RANEXA) 500 MG 12 hr tablet Take 1 tablet (500 mg total)  by mouth 2 (two) times daily. 60 tablet 3 12/24/2014 at [place]  . saccharomyces boulardii (FLORASTOR) 250 MG capsule Take 250 mg by mouth 2 (two) times daily.   12/24/2014 at 800a  . sertraline (ZOLOFT) 100 MG tablet Take 1 tablet (100 mg total) by mouth daily.   12/24/2014 at 900a  . temazepam (RESTORIL) 7.5 MG capsule Take 7.5 mg by mouth at bedtime as needed for sleep.   unknown  . traMADol (ULTRAM) 50 MG tablet Take 50 mg by mouth every 6 (six) hours as needed for moderate pain.   unknown  . furosemide (LASIX) 20 MG tablet Take 20 mg by mouth daily.   Taking  . trolamine salicylate (ASPERCREME) 10 % cream Apply topically as needed for muscle pain. (Patient not taking: Reported on 12/24/2014) 85 g 0 Taking   Scheduled: . albuterol  2.5 mg Nebulization QID  . antiseptic oral rinse  7 mL Mouth Rinse QID  . chlorhexidine  15 mL Mouth Rinse BID  . enoxaparin (LOVENOX) injection  40 mg Subcutaneous Q24H  . feeding supplement (PRO-STAT SUGAR FREE 64)  60 mL Oral TID WC  .  feeding supplement (VITAL HIGH PROTEIN)  1,000 mL Per Tube Q24H  . furosemide  40 mg Intravenous Q12H  . insulin aspart  0-20 Units Subcutaneous 6 times per day  . methylPREDNISolone (SOLU-MEDROL) injection  40 mg Intravenous Q6H  . pantoprazole (PROTONIX) IV  40 mg Intravenous Q24H  . sodium chloride  10-40 mL Intracatheter Q12H  . sodium chloride       Continuous: . sodium chloride 10 mL/hr at 12/31/14 0400  . fentaNYL infusion INTRAVENOUS 300 mcg/hr (12/31/14 0400)  . propofol 50 mcg/kg/min (12/31/14 0436)   VWA:QLRJPV chloride, acetaminophen, fentaNYL, midazolam, ondansetron (ZOFRAN) IV, sodium chloride  Assesment: She was admitted with acute on chronic respiratory failure. She was septic on admission and it was thought that she had a urinary tract infection but her urine shows no growth. She may have pneumonia but is difficult to interpret her chest x-ray because of body habitus. She had what appeared to be some volume  overload. She was dehydrated on admission and had volume resuscitation. She was 4 [name] negative yesterday. She's become very agitated when we try to cut her sedation down so we may simply have to pull the endotracheal tube with the understanding that she may have to be reintubated Active Problems:   Morbid obesity   Cerebral palsy   Hypertension   Sepsis   Acute and chronic respiratory failure with hypercapnia   Encephalopathy, metabolic   UTI (urinary tract infection)    Plan: Attempt weaning again today    LOS: 7 days   [name] 12/31/2014, 7:56 AM

## 2014-12-31 NOTE — Care Management Utilization Note (Signed)
UR completed 

## 2014-12-31 NOTE — Progress Notes (Signed)
NUTRITION FOLLOW UP  Intervention:   -RD to follow for diet advancement -Supplement diet as appropriate  Nutrition Dx:   Inadequate oral intake related to inability to eat as evidenced by NPO; ongoing  Goal:   Pt will meet >90% of estimated nutritional needs  Monitor:   Diet advancement, PO intake, labs, weight changes, I/O's  Assessment:   Pt has UTI, encephalopathy and is septic. Her hx includes COPD, Pneumonia and Cerebral palsy.   Pt extubated this AM. Nutritional needs adjusted due to change in status.  Noted pt with steady wt gain since admission, likely due to fluid overload. Expect wt loss, as pt is receiving IV lasix for diuresis.  Pt remains NPO. Will follow for diet advancement and tolerance and supplement diet as appropriate.  Labs reviewed. CO2: 38, BUN/Creat: 31/0.30, Glucose: 128, CBGS: 105-123.   Height: Ht Readings from Last 1 Encounters:  12/31/14 5\' 6"  (1.676 m)    Weight Status:   Wt Readings from Last 1 Encounters:  12/31/14 220 lb 7.4 oz (100 kg)   12/28/14 218 lb 0.6 oz (98.9 kg)   12/25/14 216 lb 7.9 oz (98.2 kg)   Re-estimated needs:  Kcal: 1700-1900 Protein: 76-86 grams Fluid: 1.7-1.9 L  Skin: Intact  Diet Order: Diet NPO time specified   Intake/Output Summary (Last 24 hours) at 12/31/14 1109 Last data filed at 12/31/14 1040  Gross per 24 hour  Intake 2201.14 ml  Output   6950 ml  Net -4748.86 ml    Last BM: 12/27/14   Labs:   Recent Labs Lab 12/29/14 0527 12/30/14 0512 12/31/14 0435  NA 135 142 143  K 4.1 3.9 3.6  CL 100 106 99  CO2 30 31 38*  BUN 18 26* 31*  CREATININE <0.30* 0.32* 0.30*  CALCIUM 7.9* 8.5 8.4  GLUCOSE 142* 157* 128*    CBG (last 3)   Recent Labs  12/30/14 2352 12/31/14 0423 12/31/14 0812  GLUCAP 123* 105* 119*    Scheduled Meds: . albuterol  2.5 mg Nebulization QID  . antiseptic oral rinse  7 mL Mouth Rinse QID  . chlorhexidine  15 mL Mouth Rinse BID  . enoxaparin (LOVENOX)  injection  40 mg Subcutaneous Q24H  . feeding supplement (PRO-STAT SUGAR FREE 64)  60 mL Oral TID WC  . feeding supplement (VITAL HIGH PROTEIN)  1,000 mL Per Tube Q24H  . furosemide  40 mg Intravenous Q12H  . insulin aspart  0-20 Units Subcutaneous 6 times per day  . methylPREDNISolone (SOLU-MEDROL) injection  40 mg Intravenous Q6H  . pantoprazole (PROTONIX) IV  40 mg Intravenous Q24H  . sodium chloride  10-40 mL Intracatheter Q12H  . sodium chloride        Continuous Infusions: . sodium chloride 10 mL/hr at 12/31/14 0400  . fentaNYL infusion INTRAVENOUS Stopped (12/31/14 0700)  . propofol 25 mcg/kg/min (12/31/14 2778)    [name] A. Jimmye Norman, RD, LDN, CDE Pager: [phone] After hours Pager: [phone]

## 2014-12-31 NOTE — Procedures (Signed)
Extubation Procedure Note RT performed ABG on patient while on CPAP/PS 5/5 40%, results within normal range. RT received orders to extubate. RT extubated to patient to 55% FIO2 venturi mask. RN at bedside. HR 109, RR 27 and SATs 91%. RT will continue to monitor.Patient unable to follow commands; therefore, no weaning parameters where done Patient Details:   Name: [name] DOB: December 03, 1941 MRN: [id number]   Airway Documentation:  Airway 7.5 mm (Active)  Secured at (cm) 21 cm 12/31/2014  7:53 AM  Measured From Lips 12/31/2014  7:53 AM  [place] 12/31/2014  7:53 AM  Secured By [employer] 12/31/2014  7:53 AM  Tube Holder Repositioned Yes 12/31/2014  7:53 AM  Cuff Pressure (cm H2O) 22 cm H2O 12/30/2014  7:02 PM  Site Condition Dry 12/31/2014  7:53 AM    Evaluation  O2 sats: currently acceptable Complications: No apparent complications Patient did tolerate procedure well. Bilateral Breath Sounds: Diminished Suctioning: Oral Yes  Lana Fish 12/31/2014, 9:24 AM

## 2014-12-31 NOTE — Clinical Social Work Note (Signed)
CSW spoke with Jackelyn Poling at [facility] and provided update on patient's status.  Debbie indicated that patient's bed remained on a bed hold by her family.   Ambrose Pancoast, [place]

## 2015-01-01 LAB — GLUCOSE, CAPILLARY
GLUCOSE-CAPILLARY: 132 mg/dL — AB (ref 70–99)
GLUCOSE-CAPILLARY: 88 mg/dL (ref 70–99)
GLUCOSE-CAPILLARY: 168 mg/dL — AB (ref 70–99)
Glucose-Capillary: 119 mg/dL — ABNORMAL HIGH (ref 70–99)
Glucose-Capillary: 112 mg/dL — ABNORMAL HIGH (ref 70–99)

## 2015-01-01 LAB — BASIC METABOLIC PANEL
ANION GAP: 7 (ref 5–15)
BUN: 22 mg/dL (ref 6–23)
CALCIUM: 8.4 mg/dL (ref 8.4–10.5)
CO2: 47 mmol/L (ref 19–32)
Chloride: 89 mEq/L — ABNORMAL LOW (ref 96–112)
Creatinine, Ser: <0.3 mg/dL — ABNORMAL LOW (ref 0.50–1.10)
Glucose, Bld: 115 mg/dL — ABNORMAL HIGH (ref 70–99)
Potassium: 2.5 mmol/L — CL (ref 3.5–5.1)
SODIUM: 143 mmol/L (ref 135–145)

## 2015-01-01 MED ORDER — METHYLPREDNISOLONE SODIUM SUCC 40 MG IJ SOLR
40.0000 mg | Freq: Two times a day (BID) | INTRAMUSCULAR | Status: DC
  Administered 2015-01-01 – 2015-01-03 (×4): 40 mg via INTRAVENOUS
  Filled 2015-01-01 (×4): qty 1

## 2015-01-01 MED ORDER — POTASSIUM CHLORIDE 10 MEQ/100ML IV SOLN
10.0000 meq | INTRAVENOUS | Status: AC
  Administered 2015-01-01 (×6): 10 meq via INTRAVENOUS
  Filled 2015-01-01 (×6): qty 100

## 2015-01-01 MED ORDER — FUROSEMIDE 10 MG/ML IJ SOLN
40.0000 mg | Freq: Every day | INTRAMUSCULAR | Status: DC
  Administered 2015-01-01 – 2015-01-10 (×10): 40 mg via INTRAVENOUS
  Filled 2015-01-01 (×10): qty 4

## 2015-01-01 NOTE — Progress Notes (Signed)
Subjective: She was able to be extubated yesterday. She has continued to be somewhat agitated but better. She has no new complaints.  Objective: Vital signs in last 24 hours: Temp:  [98.2 F (36.8 C)-100.1 F (37.8 C)] 98.2 F (36.8 C) (01/13 0400) Pulse Rate:  [37-106] 70 (01/13 0500) Resp:  [18-37] 28 (01/13 0500) BP: (84-168)/(49-108) 119/73 mmHg (01/13 0500) SpO2:  [83 %-100 %] 99 % (01/13 0712) FiO2 (%):  [40 %-55 %] 55 % (01/12 1006) Weight:  [94.1 kg (207 lb 7.3 oz)] 94.1 kg (207 lb 7.3 oz) (01/13 0500) Weight change: -5.9 kg (-13 lb 0.1 oz) Last BM Date: 12/27/14  Intake/Output from previous day: 01/12 0701 - 01/13 0700 In: 759.7 [P.O.:420; I.V.:234.7; NG/GT:105] Out: 5800 [Urine:5800]  PHYSICAL EXAM General appearance: alert and mild distress Resp: rhonchi bilaterally Cardio: regular rate and rhythm, S1, S2 normal, no murmur, click, rub or gallop GI: soft, non-tender; bowel sounds normal; no masses,  no organomegaly Extremities: extremities normal, atraumatic, no cyanosis or edema  Lab Results:  Results for orders placed or performed during the hospital encounter of 12/24/14 (from the past 48 hour(s))  Glucose, capillary     Status: Abnormal   Collection Time: 12/30/14  8:12 AM  Result Value Ref Range   Glucose-Capillary 113 (H) 70 - 99 mg/dL   Comment 1 Documented in Chart    Comment 2 Notify RN   Glucose, capillary     Status: Abnormal   Collection Time: 12/30/14  9:09 AM  Result Value Ref Range   Glucose-Capillary 134 (H) 70 - 99 mg/dL  Glucose, capillary     Status: Abnormal   Collection Time: 12/30/14 11:50 AM  Result Value Ref Range   Glucose-Capillary 110 (H) 70 - 99 mg/dL   Comment 1 Documented in Chart    Comment 2 Notify RN   Glucose, capillary     Status: Abnormal   Collection Time: 12/30/14  5:01 PM  Result Value Ref Range   Glucose-Capillary 132 (H) 70 - 99 mg/dL   Comment 1 Documented in Chart    Comment 2 Notify RN   Glucose, capillary      Status: Abnormal   Collection Time: 12/30/14  7:36 PM  Result Value Ref Range   Glucose-Capillary 130 (H) 70 - 99 mg/dL  Glucose, capillary     Status: Abnormal   Collection Time: 12/30/14 11:52 PM  Result Value Ref Range   Glucose-Capillary 123 (H) 70 - 99 mg/dL  Glucose, capillary     Status: Abnormal   Collection Time: 12/31/14  4:23 AM  Result Value Ref Range   Glucose-Capillary 105 (H) 70 - 99 mg/dL  Basic metabolic panel     Status: Abnormal   Collection Time: 12/31/14  4:35 AM  Result Value Ref Range   Sodium 143 135 - 145 mmol/L    Comment: Please note change in reference range.   Potassium 3.6 3.5 - 5.1 mmol/L    Comment: Please note change in reference range.   Chloride 99 96 - 112 mEq/L   CO2 38 (H) 19 - 32 mmol/L   Glucose, Bld 128 (H) 70 - 99 mg/dL   BUN 31 (H) 6 - 23 mg/dL   Creatinine, Ser 0.30 (L) 0.50 - 1.10 mg/dL   Calcium 8.4 8.4 - 10.5 mg/dL   GFR calc non Af Amer >90 >90 mL/min   GFR calc Af Amer >90 >90 mL/min    Comment: (NOTE) The eGFR has been calculated using the  CKD EPI equation. This calculation has not been validated in all clinical situations. eGFR's persistently <90 mL/min signify possible Chronic Kidney Disease.    Anion gap 6 5 - 15  CBC with Differential     Status: Abnormal   Collection Time: 12/31/14  4:35 AM  Result Value Ref Range   WBC 2.6 (L) 4.0 - 10.5 K/uL   RBC 3.38 (L) 3.87 - 5.11 MIL/uL   Hemoglobin 9.8 (L) 12.0 - 15.0 g/dL   HCT 31.3 (L) 36.0 - 46.0 %   MCV 92.6 78.0 - 100.0 fL   MCH 29.0 26.0 - 34.0 pg   MCHC 31.3 30.0 - 36.0 g/dL   RDW 16.4 (H) 11.5 - 15.5 %   Platelets 130 (L) 150 - 400 K/uL   Neutrophils Relative % 78 (H) 43 - 77 %   Neutro Abs 2.1 1.7 - 7.7 K/uL   Lymphocytes Relative 13 12 - 46 %   Lymphs Abs 0.3 (L) 0.7 - 4.0 K/uL   Monocytes Relative 9 3 - 12 %   Monocytes Absolute 0.2 0.1 - 1.0 K/uL   Eosinophils Relative 0 0 - 5 %   Eosinophils Absolute 0.0 0.0 - 0.7 K/uL   Basophils Relative 0 0 - 1 %    Basophils Absolute 0.0 0.0 - 0.1 K/uL  Blood gas, arterial     Status: Abnormal   Collection Time: 12/31/14  6:00 AM  Result Value Ref Range   FIO2 35.00 %   Delivery systems VENTILATOR    Mode PRESSURE REGULATED VOLUME CONTROL    VT 500 mL   Rate 8.0 resp/min   Peep/cpap 5.0 cm H20   pH, Arterial 7.374 7.350 - 7.450   pCO2 arterial 67.3 (HH) 35.0 - 45.0 mmHg    Comment: CRITICAL RESULT CALLED TO, READ BACK BY AND VERIFIED WITH: WAGONER,R. RN AT 1610 12/31/14 ANDERSON,S RRT    pO2, Arterial 84.1 80.0 - 100.0 mmHg   Bicarbonate 38.4 (H) 20.0 - 24.0 mEq/L   TCO2 35.2 0 - 100 mmol/L   Acid-Base Excess 12.7 (H) 0.0 - 2.0 mmol/L   O2 Saturation 95.9 %   Patient temperature 37.0    Collection site LEFT RADIAL    Drawn by [id number]    Sample type ARTERIAL    Allens test (pass/fail) PASS PASS  Glucose, capillary     Status: Abnormal   Collection Time: 12/31/14  8:12 AM  Result Value Ref Range   Glucose-Capillary 119 (H) 70 - 99 mg/dL   Comment 1 Documented in Chart    Comment 2 Notify RN   Blood gas, arterial     Status: Abnormal   Collection Time: 12/31/14  8:30 AM  Result Value Ref Range   FIO2 40.00 %   Delivery systems VENTILATOR    Mode CONTINUOUS POSITIVE AIRWAY PRESSURE    Peep/cpap 5.0 cm H20   Pressure support 5.0 cm H20   pH, Arterial 7.436 7.350 - 7.450   pCO2 arterial 57.4 (HH) 35.0 - 45.0 mmHg    Comment: CRITICAL RESULT CALLED TO, READ BACK BY AND VERIFIED WITH: M.MCDANIELS,RN AT 9604 01/01/15, BY L.BROADNAX, RRT     pO2, Arterial 75.5 (L) 80.0 - 100.0 mmHg   Bicarbonate 38.0 (H) 20.0 - 24.0 mEq/L   TCO2 33.8 0 - 100 mmol/L   Acid-Base Excess 13.0 (H) 0.0 - 2.0 mmol/L   O2 Saturation 95.2 %   Patient temperature 37.0    Collection site LEFT RADIAL    Drawn by  [id number]    Sample type ARTERIAL    Allens test (pass/fail) PASS PASS  Glucose, capillary     Status: Abnormal   Collection Time: 12/31/14 11:42 AM  Result Value Ref Range   Glucose-Capillary 122 (H) 70 - 99  mg/dL   Comment 1 Documented in Chart    Comment 2 Notify RN   Glucose, capillary     Status: None   Collection Time: 12/31/14  5:02 PM  Result Value Ref Range   Glucose-Capillary 95 70 - 99 mg/dL   Comment 1 Documented in Chart    Comment 2 Notify RN   Glucose, capillary     Status: Abnormal   Collection Time: 12/31/14  7:58 PM  Result Value Ref Range   Glucose-Capillary 102 (H) 70 - 99 mg/dL  Glucose, capillary     Status: Abnormal   Collection Time: 12/31/14 11:59 PM  Result Value Ref Range   Glucose-Capillary 127 (H) 70 - 99 mg/dL  Glucose, capillary     Status: Abnormal   Collection Time: 01/01/15  4:52 AM  Result Value Ref Range   Glucose-Capillary 132 (H) 70 - 99 mg/dL    ABGS  Recent Labs  12/31/14 0830  PHART 7.436  PO2ART 75.5*  TCO2 33.8  HCO3 38.0*   CULTURES Recent Results (from the past 240 hour(s))  Culture, blood (routine x 2)     Status: None   Collection Time: 12/24/14  3:30 PM  Result Value Ref Range Status   Specimen Description BLOOD RIGHT WRIST DRAWN BY RN [phone]  Final   Special Requests BOTTLES DRAWN AEROBIC AND ANAEROBIC 6CC  Final   Culture NO GROWTH 6 DAYS  Final   Report Status 12/30/2014 FINAL  Final  Culture, blood (routine x 2)     Status: None   Collection Time: 12/24/14  3:30 PM  Result Value Ref Range Status   Specimen Description BLOOD LEFT HAND DRAWN BY RN [phone]  Final   Special Requests BOTTLES DRAWN AEROBIC AND ANAEROBIC 6CC   Final   Culture   Final    STAPHYLOCOCCUS SPECIES (COAGULASE NEGATIVE) Note: THE SIGNIFICANCE OF ISOLATING THIS ORGANISM FROM A SINGLE VENIPUNCTURE CANNOT BE PREDICTED WITHOUT FURTHER CLINICAL AND CULTURE CORRELATION. SUSCEPTIBILITIES AVAILABLE ONLY ON REQUEST. Note: Gram Stain Report Called to,Read Back By and Verified With: PHILLIPS C. AT 1610 ON 12/25/14 BY BAUGHAM M. Performed at [facility] Performed at [facility]    Report Status 12/27/2014 FINAL  Final  Urine culture     Status:  None   Collection Time: 12/24/14  3:40 PM  Result Value Ref Range Status   Specimen Description URINE, CATHETERIZED  Final   Special Requests NONE  Final   Colony Count NO GROWTH Performed at [facility]   Final   Culture NO GROWTH Performed at [facility]   Final   Report Status 12/25/2014 FINAL  Final  MRSA PCR Screening     Status: Abnormal   Collection Time: 12/24/14  9:20 PM  Result Value Ref Range Status   MRSA by PCR POSITIVE (A) NEGATIVE Final    Comment:        The GeneXpert MRSA Assay (FDA approved for NASAL specimens only), is one component of a comprehensive MRSA colonization surveillance program. It is not intended to diagnose MRSA infection nor to guide or [place] [address] BY Mikel Cella K    Studies/Results: Dg Chest Port 1 View  12/31/2014  CLINICAL DATA:  Intubation.  EXAM: PORTABLE CHEST - 1 VIEW  COMPARISON:  12/29/2014.  FINDINGS: Endotracheal tube and NG tube in stable position. Right PICC line in stable position . Persistent cardiomegaly pulmonary venous congestion interstitial prominence with small bilateral pleural effusions consistent with congestive heart failure. Superimposed pneumonitis cannot be excluded. No pneumothorax.  IMPRESSION: 1. Lines and tubes in stable position. 2. Persistent changes of congestive heart failure or from interstitial edema and bilateral small pleural effusions. Superimposed pneumonia cannot be excluded.   Electronically Signed   By: Marcello Moores  Register   On: 12/31/2014 07:22    Medications:  Prior to Admission:  Prescriptions prior to admission  Medication Sig Dispense Refill Last Dose  . acetaminophen (TYLENOL) 325 MG tablet Take 2 tablets (650 mg total) by mouth every 6 (six) hours as needed for mild pain (or Fever >/= 101).   unknown  . albuterol (PROVENTIL HFA;VENTOLIN HFA) 108 (90 BASE) MCG/ACT inhaler Inhale 2 puffs into the lungs every 4 (four) hours as needed. Shortness of  breath 1 Inhaler 12 unknown  . albuterol (PROVENTIL) (2.5 MG/3ML) 0.083% nebulizer solution Take 2.5 mg by nebulization 4 (four) times daily.   12/24/2014 at 1200  . ALPRAZolam (XANAX) 1 MG tablet Take 1 mg by mouth 4 (four) times daily.    12/24/2014 at 1400  . Amino Acids-Protein Hydrolys (FEEDING SUPPLEMENT, PRO-STAT SUGAR FREE 64,) LIQD Take 30 mLs by mouth 2 (two) times daily with a meal.   12/24/2014 at 800a  . aspirin 325 MG tablet Take 1 tablet (325 mg total) by mouth daily. 30 tablet 12 12/24/2014 at [place]  . atorvastatin (LIPITOR) 40 MG tablet Take 40 mg by mouth daily.   12/24/2014 at [place]  . baclofen (LIORESAL) 10 MG tablet Take 5 mg by mouth 3 (three) times daily.   12/24/2014 at 1400  . cefTRIAXone (ROCEPHIN) 1 G injection Inject 1 g into the muscle once. Last administered on 12/23/2014 at 22:00 IM to right hip   12/23/2014  . cholecalciferol (VITAMIN D) 1000 UNITS tablet Take 1,000 Units by mouth daily.   12/24/2014 at [place]  . clopidogrel (PLAVIX) 75 MG tablet Take 1 tablet (75 mg total) by mouth daily with breakfast.   12/24/2014 at [place]  . Cranberry 450 MG CAPS Take 1 capsule by mouth daily.   12/24/2014 at 900a  . guaiFENesin (MUCINEX) 600 MG 12 hr tablet Take 600 mg by mouth 2 (two) times daily.   12/24/2014 at 800a  . haloperidol (HALDOL) 0.5 MG tablet Take 0.5 mg by mouth 2 (two) times daily.    12/24/2014 at 900a  . levothyroxine (SYNTHROID, LEVOTHROID) 88 MCG tablet Take 1 tablet (88 mcg total) by mouth daily before breakfast. 30 tablet 12 12/24/2014 at 600a  . loperamide (IMODIUM) 2 MG capsule Take 1 capsule (2 mg total) by mouth as needed for diarrhea or loose stools. 30 capsule 0 unknown  . loratadine (CLARITIN) 10 MG tablet Take 10 mg by mouth daily.   12/24/2014 at [place]  . nitroGLYCERIN (NITROSTAT) 0.4 MG SL tablet Place 1 tablet (0.4 mg total) under the tongue every 5 (five) minutes as needed for chest pain. 25 tablet 12 unknown  . pantoprazole (PROTONIX) 40 MG tablet Take 1 tablet (40 mg total) by  mouth daily. 30 tablet 12 12/24/2014 at 600a  . polyethylene glycol (MIRALAX / GLYCOLAX) packet Take 17 g by mouth daily.   12/24/2014 at 900a  . potassium chloride SA (K-DUR,KLOR-CON) 20 MEQ tablet Take 10  mEq by mouth daily.    12/24/2014 at [place]  . predniSONE (DELTASONE) 5 MG tablet Take 5 mg by mouth daily with breakfast.   12/24/2014 at [place]  . ranolazine (RANEXA) 500 MG 12 hr tablet Take 1 tablet (500 mg total) by mouth 2 (two) times daily. 60 tablet 3 12/24/2014 at [place]  . saccharomyces boulardii (FLORASTOR) 250 MG capsule Take 250 mg by mouth 2 (two) times daily.   12/24/2014 at 800a  . sertraline (ZOLOFT) 100 MG tablet Take 1 tablet (100 mg total) by mouth daily.   12/24/2014 at 900a  . temazepam (RESTORIL) 7.5 MG capsule Take 7.5 mg by mouth at bedtime as needed for sleep.   unknown  . traMADol (ULTRAM) 50 MG tablet Take 50 mg by mouth every 6 (six) hours as needed for moderate pain.   unknown  . furosemide (LASIX) 20 MG tablet Take 20 mg by mouth daily.   Taking  . trolamine salicylate (ASPERCREME) 10 % cream Apply topically as needed for muscle pain. (Patient not taking: Reported on 12/24/2014) 85 g 0 Taking   Scheduled: . albuterol  2.5 mg Nebulization QID  . antiseptic oral rinse  7 mL Mouth Rinse QID  . chlorhexidine  15 mL Mouth Rinse BID  . enoxaparin (LOVENOX) injection  40 mg Subcutaneous Q24H  . feeding supplement (PRO-STAT SUGAR FREE 64)  60 mL Oral TID WC  . furosemide  40 mg Intravenous Daily  . haloperidol  0.5 mg Oral BID  . insulin aspart  0-20 Units Subcutaneous 6 times per day  . methylPREDNISolone (SOLU-MEDROL) injection  40 mg Intravenous Q12H  . pantoprazole (PROTONIX) IV  40 mg Intravenous Q24H  . sodium chloride  10-40 mL Intracatheter Q12H   Continuous:  ZOX:WRUEAV chloride, acetaminophen, albuterol, ALPRAZolam, fentaNYL, ondansetron (ZOFRAN) IV, sodium chloride  Assesment: She was admitted with acute on chronic respiratory failure with sepsis. She is better. She had  some volume overload from resuscitation from sepsis and that has improved. She required intubation and mechanical ventilation but was able to be extubated yesterday. She has had metabolic encephalopathy and that has improved. At baseline she has pretty severe cerebral palsy with choreoathetoid movements and that has continued. She was thought to have a urinary tract infection but her urine has no growth. Active Problems:   Morbid obesity   Cerebral palsy   Hypertension   Sepsis   Acute and chronic respiratory failure with hypercapnia   Encephalopathy, metabolic   UTI (urinary tract infection)    Plan: Continue treatments. Advance her diet. Reduce her Lasix. Physical therapy consultation.    LOS: 8 days   Mary Secord L 01/01/2015, 7:52 AM

## 2015-01-01 NOTE — Progress Notes (Signed)
CRITICAL VALUE ALERT  Critical value received:  Potassium 2.5 CO2 47  Date of notification:  01/01/15  Time of notification:  0840  Critical value read back:Yes.    Nurse who received alert:  N.Leoda Smithhart,RN  MD notified (1st page):  Luan Pulling  Time of first page:  [phone]  MD notified (2nd page):  Time of second page:  Responding MD:  Luan Pulling  Time MD responded:  [phone]

## 2015-01-01 NOTE — Progress Notes (Signed)
PT Cancellation Note  Patient Details Name: [name] MRN: [id number] DOB: Jan 04, 1941   Cancelled Treatment:    Reason Eval/Treat Not Completed: PT screened, no needs identified, will sign off  Received PT order and chart reviewed.  Per chart, pt is a LTC resident at [facility] and is W/C or bed bound.  Per patient she requires assist x2 or mechanical lift to transfer from bed -> W/C.  Per patient she has not been receiving PT services at [facility] recently.  LE assessment completed in bed, with active ROM in (B) LE noted.  Recommend full PT assessment once return to [facility] to assess for deficits in PLOF.    [name], DPT [phone]   01/01/2015, 4:38 PM

## 2015-01-02 LAB — BASIC METABOLIC PANEL
Anion gap: 8 (ref 5–15)
BUN: 18 mg/dL (ref 6–23)
CO2: 47 mmol/L (ref 19–32)
Calcium: 8.5 mg/dL (ref 8.4–10.5)
Chloride: 87 mEq/L — ABNORMAL LOW (ref 96–112)
Glucose, Bld: 99 mg/dL (ref 70–99)
POTASSIUM: 2.7 mmol/L — AB (ref 3.5–5.1)
SODIUM: 142 mmol/L (ref 135–145)

## 2015-01-02 LAB — CBC WITH DIFFERENTIAL/PLATELET
Basophils Absolute: 0 10*3/uL (ref 0.0–0.1)
Basophils Relative: 0 % (ref 0–1)
EOS ABS: 0 10*3/uL (ref 0.0–0.7)
EOS PCT: 0 % (ref 0–5)
HEMATOCRIT: 44.8 % (ref 36.0–46.0)
Hemoglobin: 13.2 g/dL (ref 12.0–15.0)
Lymphocytes Relative: 15 % (ref 12–46)
Lymphs Abs: 0.8 10*3/uL (ref 0.7–4.0)
MCH: 26.7 pg (ref 26.0–34.0)
MCHC: 29.5 g/dL — AB (ref 30.0–36.0)
MCV: 90.5 fL (ref 78.0–100.0)
Monocytes Absolute: 0.6 10*3/uL (ref 0.1–1.0)
Monocytes Relative: 11 % (ref 3–12)
NEUTROS ABS: 3.9 10*3/uL (ref 1.7–7.7)
Neutrophils Relative %: 74 % (ref 43–77)
Platelets: 167 10*3/uL (ref 150–400)
RBC: 4.95 MIL/uL (ref 3.87–5.11)
RDW: 15.9 % — AB (ref 11.5–15.5)
WBC: 5.2 10*3/uL (ref 4.0–10.5)

## 2015-01-02 LAB — GLUCOSE, CAPILLARY
GLUCOSE-CAPILLARY: 101 mg/dL — AB (ref 70–99)
GLUCOSE-CAPILLARY: 110 mg/dL — AB (ref 70–99)
Glucose-Capillary: 100 mg/dL — ABNORMAL HIGH (ref 70–99)
Glucose-Capillary: 109 mg/dL — ABNORMAL HIGH (ref 70–99)
Glucose-Capillary: 128 mg/dL — ABNORMAL HIGH (ref 70–99)
Glucose-Capillary: 141 mg/dL — ABNORMAL HIGH (ref 70–99)

## 2015-01-02 MED ORDER — POTASSIUM CHLORIDE CRYS ER 20 MEQ PO TBCR
40.0000 meq | EXTENDED_RELEASE_TABLET | Freq: Once | ORAL | Status: AC
  Administered 2015-01-02: 40 meq via ORAL
  Filled 2015-01-02: qty 2

## 2015-01-02 MED ORDER — PANTOPRAZOLE SODIUM 40 MG PO TBEC
40.0000 mg | DELAYED_RELEASE_TABLET | Freq: Every day | ORAL | Status: DC
  Administered 2015-01-02 – 2015-01-05 (×4): 40 mg via ORAL
  Filled 2015-01-02 (×4): qty 1

## 2015-01-02 MED ORDER — GLUCERNA SHAKE PO LIQD
237.0000 mL | Freq: Two times a day (BID) | ORAL | Status: DC
  Administered 2015-01-02 – 2015-01-05 (×5): 237 mL via ORAL

## 2015-01-02 MED ORDER — POTASSIUM CHLORIDE CRYS ER 20 MEQ PO TBCR
20.0000 meq | EXTENDED_RELEASE_TABLET | Freq: Two times a day (BID) | ORAL | Status: DC
  Administered 2015-01-02 (×2): 20 meq via ORAL
  Filled 2015-01-02 (×2): qty 1

## 2015-01-02 NOTE — Progress Notes (Signed)
Postassium critical at 2.7 Serum CO2 critical at 47 Dr. Luan Pulling notified

## 2015-01-02 NOTE — Progress Notes (Signed)
The patient is receiving Protonix by the intravenous route.  Based on criteria approved by the Pharmacy and [place], the medication is being converted to the equivalent oral dose form.  These criteria include: -No Active GI bleeding -Able to tolerate diet of full liquids (or better) or tube feeding OR able to tolerate other medications by the oral or enteral route  If you have any questions about this conversion, please contact the Pharmacy Department (ext [phone]).  Thank you.  Biagio Borg, [facility] 01/02/2015 10:32 AM

## 2015-01-02 NOTE — Clinical Social Work Note (Signed)
CSW met with patient. Patient was alert and slightly confused. Patient spoke about a song she wrote but could not remember the lyrics. CSW discussed with patient her family's discharge plan for her to return to [facility]. Patient agreed to return to [facility] upon discharge.    Ambrose Pancoast, [place]

## 2015-01-02 NOTE — Progress Notes (Signed)
Subjective: She says she feels okay. She is somewhat confused. She does not appear to be in any distress.  Objective: Vital signs in last 24 hours: Temp:  [98 F (36.7 C)-98.8 F (37.1 C)] 98.3 F (36.8 C) (01/14 0400) Pulse Rate:  [45-104] 78 (01/14 0600) Resp:  [17-31] 21 (01/14 0600) BP: (99-138)/(49-100) 115/60 mmHg (01/14 0600) SpO2:  [9 %-98 %] 96 % (01/14 0649) Weight:  [93.3 kg (205 lb 11 oz)] 93.3 kg (205 lb 11 oz) (01/14 0500) Weight change: -0.8 kg (-1 lb 12.2 oz) Last BM Date: 12/27/14  Intake/Output from previous day: 01/13 0701 - 01/14 0700 In: 850 [I.V.:250; IV Piggyback:600] Out: 3025 [Urine:3025]  PHYSICAL EXAM General appearance: alert, cooperative and mild distress Resp: rhonchi bilaterally Cardio: regular rate and rhythm, S1, S2 normal, no murmur, click, rub or gallop GI: soft, non-tender; bowel sounds normal; no masses,  no organomegaly Extremities: extremities normal, atraumatic, no cyanosis or edema  Lab Results:  Results for orders placed or performed during the hospital encounter of 12/24/14 (from the past 48 hour(s))  Glucose, capillary     Status: Abnormal   Collection Time: 12/31/14  8:12 AM  Result Value Ref Range   Glucose-Capillary 119 (H) 70 - 99 mg/dL   Comment 1 Documented in Chart    Comment 2 Notify RN   Blood gas, arterial     Status: Abnormal   Collection Time: 12/31/14  8:30 AM  Result Value Ref Range   FIO2 40.00 %   Delivery systems VENTILATOR    Mode CONTINUOUS POSITIVE AIRWAY PRESSURE    Peep/cpap 5.0 cm H20   Pressure support 5.0 cm H20   pH, Arterial 7.436 7.350 - 7.450   pCO2 arterial 57.4 (HH) 35.0 - 45.0 mmHg    Comment: CRITICAL RESULT CALLED TO, READ BACK BY AND VERIFIED WITH: M.MCDANIELS,RN AT 7628 01/01/15, BY [name].BROADNAX, RRT     pO2, Arterial 75.5 ([name]) 80.0 - 100.0 mmHg   Bicarbonate 38.0 (H) 20.0 - 24.0 mEq/[name]   TCO2 33.8 0 - 100 mmol/[name]   Acid-Base Excess 13.0 (H) 0.0 - 2.0 mmol/[name]   O2 Saturation 95.2 %   Patient temperature 37.0    Collection site LEFT RADIAL    Drawn by [id number]    Sample type ARTERIAL    Allens test (pass/fail) PASS PASS  Glucose, capillary     Status: Abnormal   Collection Time: 12/31/14 11:42 AM  Result Value Ref Range   Glucose-Capillary 122 (H) 70 - 99 mg/dL   Comment 1 Documented in Chart    Comment 2 Notify RN   Glucose, capillary     Status: None   Collection Time: 12/31/14  5:02 PM  Result Value Ref Range   Glucose-Capillary 95 70 - 99 mg/dL   Comment 1 Documented in Chart    Comment 2 Notify RN   Glucose, capillary     Status: Abnormal   Collection Time: 12/31/14  7:58 PM  Result Value Ref Range   Glucose-Capillary 102 (H) 70 - 99 mg/dL  Glucose, capillary     Status: Abnormal   Collection Time: 12/31/14 11:59 PM  Result Value Ref Range   Glucose-Capillary 127 (H) 70 - 99 mg/dL  Glucose, capillary     Status: Abnormal   Collection Time: 01/01/15  4:52 AM  Result Value Ref Range   Glucose-Capillary 132 (H) 70 - 99 mg/dL  Basic metabolic panel     Status: Abnormal   Collection Time: 01/01/15  8:01 AM  Result Value Ref Range   Sodium 143 135 - 145 mmol/[name]    Comment: Please note change in reference range.   Potassium 2.5 (LL) 3.5 - 5.1 mmol/[name]    Comment: CRITICAL RESULT CALLED TO, READ BACK BY AND VERIFIED WITH: ROWE,N AT 8:40AM ON 01/01/15 BY FESTERMAN,C Please note change in reference range.    Chloride 89 ([name]) 96 - 112 mEq/[name]    Comment: DELTA CHECK NOTED   CO2 47 (HH) 19 - 32 mmol/[name]    Comment: CRITICAL RESULT CALLED TO, READ BACK BY AND VERIFIED WITH: ROWE,N AT 8:40AM ON 01/01/15 BY FESTERMAN,C    Glucose, Bld 115 (H) 70 - 99 mg/dL   BUN 22 6 - 23 mg/dL   Creatinine, Ser <0.30 ([name]) 0.50 - 1.10 mg/dL   Calcium 8.4 8.4 - 10.5 mg/dL   GFR calc non Af Amer NOT CALCULATED >90 mL/min   GFR calc Af Amer NOT CALCULATED >90 mL/min    Comment: (NOTE) The eGFR has been calculated using the CKD EPI equation. This calculation has not been validated in  all clinical situations. eGFR's persistently <90 mL/min signify possible Chronic Kidney Disease.    Anion gap 7 5 - 15  Glucose, capillary     Status: Abnormal   Collection Time: 01/01/15  8:11 AM  Result Value Ref Range   Glucose-Capillary 119 (H) 70 - 99 mg/dL  Glucose, capillary     Status: Abnormal   Collection Time: 01/01/15 11:17 AM  Result Value Ref Range   Glucose-Capillary 112 (H) 70 - 99 mg/dL   Comment 1 Notify RN   Glucose, capillary     Status: None   Collection Time: 01/01/15  4:10 PM  Result Value Ref Range   Glucose-Capillary 88 70 - 99 mg/dL   Comment 1 Notify RN   Glucose, capillary     Status: Abnormal   Collection Time: 01/01/15  8:01 PM  Result Value Ref Range   Glucose-Capillary 168 (H) 70 - 99 mg/dL   Comment 1 Notify RN   Glucose, capillary     Status: Abnormal   Collection Time: 01/02/15 12:10 AM  Result Value Ref Range   Glucose-Capillary 110 (H) 70 - 99 mg/dL   Comment 1 Notify RN   Glucose, capillary     Status: Abnormal   Collection Time: 01/02/15  4:19 AM  Result Value Ref Range   Glucose-Capillary 100 (H) 70 - 99 mg/dL   Comment 1 Notify RN   Basic metabolic panel     Status: Abnormal   Collection Time: 01/02/15  6:03 AM  Result Value Ref Range   Sodium 142 135 - 145 mmol/[name]    Comment: Please note change in reference range.   Potassium 2.7 (LL) 3.5 - 5.1 mmol/[name]    Comment: CRITICAL RESULT CALLED TO, READ BACK BY AND VERIFIED WITH: WAGONER,R AT 6:55AM ON 01/02/15 BY FESTERMAN,C Please note change in reference range.    Chloride 87 ([name]) 96 - 112 mEq/[name]   CO2 47 (HH) 19 - 32 mmol/[name]    Comment: CRITICAL RESULT CALLED TO, READ BACK BY AND VERIFIED WITH: WAGONER,R AT 6:55AM ON 01/02/15 BY FESTERMAN,C    Glucose, Bld 99 70 - 99 mg/dL   BUN 18 6 - 23 mg/dL   Creatinine, Ser <0.30 ([name]) 0.50 - 1.10 mg/dL   Calcium 8.5 8.4 - 10.5 mg/dL   GFR calc non Af Amer NOT CALCULATED >90 mL/min   GFR calc Af Amer NOT CALCULATED >90 mL/min  Comment:  (NOTE) The eGFR has been calculated using the CKD EPI equation. This calculation has not been validated in all clinical situations. eGFR's persistently <90 mL/min signify possible Chronic Kidney Disease.    Anion gap 8 5 - 15  CBC with Differential     Status: Abnormal   Collection Time: 01/02/15  6:03 AM  Result Value Ref Range   WBC 5.2 4.0 - 10.5 K/uL   RBC 4.95 3.87 - 5.11 MIL/uL   Hemoglobin 13.2 12.0 - 15.0 g/dL   HCT 44.8 36.0 - 46.0 %   MCV 90.5 78.0 - 100.0 fL   MCH 26.7 26.0 - 34.0 pg   MCHC 29.5 ([name]) 30.0 - 36.0 g/dL   RDW 15.9 (H) 11.5 - 15.5 %   Platelets 167 150 - 400 K/uL   Neutrophils Relative % 74 43 - 77 %   Neutro Abs 3.9 1.7 - 7.7 K/uL   Lymphocytes Relative 15 12 - 46 %   Lymphs Abs 0.8 0.7 - 4.0 K/uL   Monocytes Relative 11 3 - 12 %   Monocytes Absolute 0.6 0.1 - 1.0 K/uL   Eosinophils Relative 0 0 - 5 %   Eosinophils Absolute 0.0 0.0 - 0.7 K/uL   Basophils Relative 0 0 - 1 %   Basophils Absolute 0.0 0.0 - 0.1 K/uL  Glucose, capillary     Status: Abnormal   Collection Time: 01/02/15  7:37 AM  Result Value Ref Range   Glucose-Capillary 109 (H) 70 - 99 mg/dL   Comment 1 Documented in Chart    Comment 2 Notify RN     ABGS  Recent Labs  12/31/14 0830  PHART 7.436  PO2ART 75.5*  TCO2 33.8  HCO3 38.0*   CULTURES Recent Results (from the past 240 hour(s))  Culture, blood (routine x 2)     Status: None   Collection Time: 12/24/14  3:30 PM  Result Value Ref Range Status   Specimen Description BLOOD RIGHT WRIST DRAWN BY RN [phone]  Final   Special Requests BOTTLES DRAWN AEROBIC AND ANAEROBIC 6CC  Final   Culture NO GROWTH 6 DAYS  Final   Report Status 12/30/2014 FINAL  Final  Culture, blood (routine x 2)     Status: None   Collection Time: 12/24/14  3:30 PM  Result Value Ref Range Status   Specimen Description BLOOD LEFT HAND DRAWN BY RN [phone]  Final   Special Requests BOTTLES DRAWN AEROBIC AND ANAEROBIC 6CC   Final   Culture   Final     STAPHYLOCOCCUS SPECIES (COAGULASE NEGATIVE) Note: THE SIGNIFICANCE OF ISOLATING THIS ORGANISM FROM A SINGLE VENIPUNCTURE CANNOT BE PREDICTED WITHOUT FURTHER CLINICAL AND CULTURE CORRELATION. SUSCEPTIBILITIES AVAILABLE ONLY ON REQUEST. Note: Gram Stain Report Called to,Read Back By and Verified With: PHILLIPS C. AT 1610 ON 12/25/14 BY BAUGHAM M. Performed at [facility] Performed at [facility]    Report Status 12/27/2014 FINAL  Final  Urine culture     Status: None   Collection Time: 12/24/14  3:40 PM  Result Value Ref Range Status   Specimen Description URINE, CATHETERIZED  Final   Special Requests NONE  Final   Colony Count NO GROWTH Performed at [facility]   Final   Culture NO GROWTH Performed at [facility]   Final   Report Status 12/25/2014 FINAL  Final  MRSA PCR Screening     Status: Abnormal   Collection Time: 12/24/14  9:20 PM  Result Value Ref  Range Status   MRSA by PCR POSITIVE (A) NEGATIVE Final    Comment:        The GeneXpert MRSA Assay (FDA approved for NASAL specimens only), is one component of a comprehensive MRSA colonization surveillance program. It is not intended to diagnose MRSA infection nor to guide or monitor [place] [address] BY FORSYTH K    Studies/Results: No results found.  Medications:  Prior to Admission:  Prescriptions prior to admission  Medication Sig Dispense Refill Last Dose  . acetaminophen (TYLENOL) 325 MG tablet Take 2 tablets (650 mg total) by mouth every 6 (six) hours as needed for mild pain (or Fever >/= 101).   unknown  . albuterol (PROVENTIL HFA;VENTOLIN HFA) 108 (90 BASE) MCG/ACT inhaler Inhale 2 puffs into the lungs every 4 (four) hours as needed. Shortness of breath 1 Inhaler 12 unknown  . albuterol (PROVENTIL) (2.5 MG/3ML) 0.083% nebulizer solution Take 2.5 mg by nebulization 4 (four) times daily.   12/24/2014 at 1200  . ALPRAZolam (XANAX) 1 MG tablet Take 1 mg by mouth  4 (four) times daily.    12/24/2014 at 1400  . Amino Acids-Protein Hydrolys (FEEDING SUPPLEMENT, PRO-STAT SUGAR FREE 64,) LIQD Take 30 mLs by mouth 2 (two) times daily with a meal.   12/24/2014 at 800a  . aspirin 325 MG tablet Take 1 tablet (325 mg total) by mouth daily. 30 tablet 12 12/24/2014 at [place]  . atorvastatin (LIPITOR) 40 MG tablet Take 40 mg by mouth daily.   12/24/2014 at [place]  . baclofen (LIORESAL) 10 MG tablet Take 5 mg by mouth 3 (three) times daily.   12/24/2014 at 1400  . cefTRIAXone (ROCEPHIN) 1 G injection Inject 1 g into the muscle once. Last administered on 12/23/2014 at 22:00 IM to right hip   12/23/2014  . cholecalciferol (VITAMIN D) 1000 UNITS tablet Take 1,000 Units by mouth daily.   12/24/2014 at [place]  . clopidogrel (PLAVIX) 75 MG tablet Take 1 tablet (75 mg total) by mouth daily with breakfast.   12/24/2014 at [place]  . Cranberry 450 MG CAPS Take 1 capsule by mouth daily.   12/24/2014 at 900a  . guaiFENesin (MUCINEX) 600 MG 12 hr tablet Take 600 mg by mouth 2 (two) times daily.   12/24/2014 at 800a  . haloperidol (HALDOL) 0.5 MG tablet Take 0.5 mg by mouth 2 (two) times daily.    12/24/2014 at 900a  . levothyroxine (SYNTHROID, LEVOTHROID) 88 MCG tablet Take 1 tablet (88 mcg total) by mouth daily before breakfast. 30 tablet 12 12/24/2014 at 600a  . loperamide (IMODIUM) 2 MG capsule Take 1 capsule (2 mg total) by mouth as needed for diarrhea or loose stools. 30 capsule 0 unknown  . loratadine (CLARITIN) 10 MG tablet Take 10 mg by mouth daily.   12/24/2014 at [place]  . nitroGLYCERIN (NITROSTAT) 0.4 MG SL tablet Place 1 tablet (0.4 mg total) under the tongue every 5 (five) minutes as needed for chest pain. 25 tablet 12 unknown  . pantoprazole (PROTONIX) 40 MG tablet Take 1 tablet (40 mg total) by mouth daily. 30 tablet 12 12/24/2014 at 600a  . polyethylene glycol (MIRALAX / GLYCOLAX) packet Take 17 g by mouth daily.   12/24/2014 at 900a  . potassium chloride SA (K-DUR,KLOR-CON) 20 MEQ tablet Take 10 mEq by mouth  daily.    12/24/2014 at [place]  . predniSONE (DELTASONE) 5 MG tablet Take 5 mg by mouth daily with breakfast.   12/24/2014 at [place]  .  ranolazine (RANEXA) 500 MG 12 hr tablet Take 1 tablet (500 mg total) by mouth 2 (two) times daily. 60 tablet 3 12/24/2014 at [place]  . saccharomyces boulardii (FLORASTOR) 250 MG capsule Take 250 mg by mouth 2 (two) times daily.   12/24/2014 at 800a  . sertraline (ZOLOFT) 100 MG tablet Take 1 tablet (100 mg total) by mouth daily.   12/24/2014 at 900a  . temazepam (RESTORIL) 7.5 MG capsule Take 7.5 mg by mouth at bedtime as needed for sleep.   unknown  . traMADol (ULTRAM) 50 MG tablet Take 50 mg by mouth every 6 (six) hours as needed for moderate pain.   unknown  . furosemide (LASIX) 20 MG tablet Take 20 mg by mouth daily.   Taking  . trolamine salicylate (ASPERCREME) 10 % cream Apply topically as needed for muscle pain. (Patient not taking: Reported on 12/24/2014) 85 g 0 Taking   Scheduled: . albuterol  2.5 mg Nebulization QID  . enoxaparin (LOVENOX) injection  40 mg Subcutaneous Q24H  . furosemide  40 mg Intravenous Daily  . haloperidol  0.5 mg Oral BID  . insulin aspart  0-20 Units Subcutaneous 6 times per day  . methylPREDNISolone (SOLU-MEDROL) injection  40 mg Intravenous Q12H  . pantoprazole (PROTONIX) IV  40 mg Intravenous Q24H  . potassium chloride  20 mEq Oral BID  . potassium chloride  40 mEq Oral Once  . sodium chloride  10-40 mL Intracatheter Q12H   Continuous:  ZOX:WRUEAV chloride, acetaminophen, albuterol, ALPRAZolam, fentaNYL, ondansetron (ZOFRAN) IV, sodium chloride  Assesment: She was admitted with acute on chronic respiratory failure with hypercapnia and hypoxia. She required ventilator support but was able to be extubated. At baseline she has cerebral palsy and that sometimes makes evaluation difficult. She has choreoathetoid movements. She has metabolic encephalopathy and was very agitated and confused but that has improved. She is still confused and  slightly disoriented. She is hypokalemic and that is going to need to be replaced. Active Problems:   Morbid obesity   Cerebral palsy   Hypertension   Sepsis   Acute and chronic respiratory failure with hypercapnia   Encephalopathy, metabolic   UTI (urinary tract infection)    Plan: She will have potassium replacement. She will continue with her current treatment otherwise. Transfer from ICU today.    LOS: 9 days   [name] 01/02/2015, 7:43 AM

## 2015-01-02 NOTE — Progress Notes (Addendum)
NUTRITION FOLLOW UP  Intervention:   Glucerna Shake po BID, each supplement provides 220 kcal and 10 grams of protein  Nutrition Dx:   Inadequate oral intake related to decreased appetite, confusionas evidenced by 25% meal completion; ongoing  Goal:   Pt will meet >90% of estimated nutritional needs; unmet  Monitor:   PO/supplement intake, labs, weight changes, I/O's  Assessment:   Pt has UTI, encephalopathy and is septic. Her hx includes COPD, Pneumonia and Cerebral palsy.   Pt was transferred from ICU to floor today. She has been advanced to a dysphagia 1, carb modified diet. Due to confusion, she was unable to provide hx. Noted 25% of meal completion.  Noted a 15# wt loss x 2 days, due to diuresis.  Labs reviewed. K: 2.7 (on supplement), Cl: 87, CO2: 47, Creat: <0.30, CBGS: 100-128.   Height: Ht Readings from Last 1 Encounters:  12/31/14 5\' 6"  (1.676 m)    Weight Status:   Wt Readings from Last 1 Encounters:  01/02/15 205 lb 11 oz (93.3 kg)   12/31/14 220 lb 7.4 oz (100 kg)   12/28/14 218 lb 0.6 oz (98.9 kg)   12/25/14 216 lb 7.9 oz (98.2 kg)   Re-estimated needs:  Kcal: 1600-1800 Protein: 74-84 grams Fluid: 1.6-1.8 L  Skin: Intact  Diet Order: DIET - DYS 1, Carb Modified   Intake/Output Summary (Last 24 hours) at 01/02/15 1214 Last data filed at 01/02/15 0900  Gross per 24 hour  Intake    970 ml  Output   1575 ml  Net   -605 ml    Last BM: 01/01/15   Labs:   Recent Labs Lab 12/31/14 0435 01/01/15 0801 01/02/15 0603  NA 143 143 142  K 3.6 2.5* 2.7*  CL 99 89* 87*  CO2 38* 47* 47*  BUN 31* 22 18  CREATININE 0.30* <0.30* <0.30*  CALCIUM 8.4 8.4 8.5  GLUCOSE 128* 115* 99    CBG (last 3)   Recent Labs  01/02/15 0419 01/02/15 0737 01/02/15 1135  GLUCAP 100* 109* 128*    Scheduled Meds: . albuterol  2.5 mg Nebulization QID  . enoxaparin (LOVENOX) injection  40 mg Subcutaneous Q24H  . furosemide  40 mg Intravenous Daily  .  haloperidol  0.5 mg Oral BID  . insulin aspart  0-20 Units Subcutaneous 6 times per day  . methylPREDNISolone (SOLU-MEDROL) injection  40 mg Intravenous Q12H  . pantoprazole  40 mg Oral Daily  . potassium chloride  20 mEq Oral BID  . sodium chloride  10-40 mL Intracatheter Q12H    Continuous Infusions:    Grace Haggart A. Jimmye Norman, RD, LDN, CDE Pager: [phone] After hours Pager: [phone]

## 2015-01-02 NOTE — Progress Notes (Signed)
Report called to C.Knight,RN. Patient transferred to 301 via bed with NT.

## 2015-01-03 LAB — BASIC METABOLIC PANEL
Anion gap: 9 (ref 5–15)
BUN: 18 mg/dL (ref 6–23)
CO2: 47 mmol/L — AB (ref 19–32)
Calcium: 8.8 mg/dL (ref 8.4–10.5)
Chloride: 91 mEq/L — ABNORMAL LOW (ref 96–112)
Glucose, Bld: 90 mg/dL (ref 70–99)
POTASSIUM: 3 mmol/L — AB (ref 3.5–5.1)
Sodium: 147 mmol/L — ABNORMAL HIGH (ref 135–145)

## 2015-01-03 LAB — GLUCOSE, CAPILLARY
GLUCOSE-CAPILLARY: 111 mg/dL — AB (ref 70–99)
GLUCOSE-CAPILLARY: 134 mg/dL — AB (ref 70–99)
Glucose-Capillary: 101 mg/dL — ABNORMAL HIGH (ref 70–99)
Glucose-Capillary: 101 mg/dL — ABNORMAL HIGH (ref 70–99)
Glucose-Capillary: 135 mg/dL — ABNORMAL HIGH (ref 70–99)
Glucose-Capillary: 147 mg/dL — ABNORMAL HIGH (ref 70–99)

## 2015-01-03 MED ORDER — POTASSIUM CHLORIDE CRYS ER 20 MEQ PO TBCR
40.0000 meq | EXTENDED_RELEASE_TABLET | Freq: Two times a day (BID) | ORAL | Status: DC
  Administered 2015-01-03 – 2015-01-05 (×5): 40 meq via ORAL
  Filled 2015-01-03 (×5): qty 2

## 2015-01-03 MED ORDER — PREDNISONE 20 MG PO TABS
40.0000 mg | ORAL_TABLET | Freq: Every day | ORAL | Status: DC
  Administered 2015-01-03 – 2015-01-05 (×3): 40 mg via ORAL
  Filled 2015-01-03 (×3): qty 2

## 2015-01-03 MED ORDER — POTASSIUM CHLORIDE CRYS ER 20 MEQ PO TBCR
40.0000 meq | EXTENDED_RELEASE_TABLET | Freq: Once | ORAL | Status: AC
  Administered 2015-01-03: 40 meq via ORAL
  Filled 2015-01-03: qty 2

## 2015-01-03 NOTE — Evaluation (Signed)
Clinical/Bedside Swallow Evaluation Patient Details  Name: [name] MRN: [id number] Date of Birth: 30-Apr-1941  Today's Date: 01/03/2015 Time: 0233-4356 SLP Time Calculation (min) (ACUTE ONLY): 21 min  Past Medical History:  Past Medical History  Diagnosis Date  . Cerebral palsy   . Asthma   . Seasonal allergies   . Thyroid disease   . HTN (hypertension)   . Anxiety   . Arthritis   . Breast cancer     Right breast, infiltrating ductal.  . Anginal pain   . Osteoporosis 05/08/2012  . Osteoporosis 05/08/2012  . Stroke   . Angina at rest   . Chronic steroid use   . Pneumonia 09/2013  . Dysphagia   . Hypopotassemia   . Respiratory failure   . Generalized weakness   . Urinary retention   . Dysphagia   . Acute MI   . Hypopotassemia   . Sepsis   . Neurogenic bladder   . Reflux   . Obesity    Past Surgical History:  Past Surgical History  Procedure Laterality Date  . Breast lumpectomy    . Radical abdominal hysterectomy    . Dental extraction    . Right hand surgery     HPI:  Pt is a 74 y.o. female with history of cerebral palsy- wheelchair bound or bedbound. Presented to ED 1/5 with fever, decreased responsiveness and likely UTI, encephalopathy, and sepsis. Pt also has hx of PNA and dysphagia- MBS 03/18/14 revealed mild-mod dysphagia with silent flash penetration; recommendation at that time was dysphagia 2 diet/ thin liquids. Pt was intubated on vent support 1/5, extubated 1/12. CXR revealed bibasilar atelectasis 1/6. Pt placed on dysphagia 1/ thin liquid diet on 1/12, bedside swallow eval ordered to consider advancement to dysphagia 2.    Assessment / Plan / Recommendation Clinical Impression  The pt demonstrated no overt s/s of aspiration at bedside- swallow appeared timely with adequate hyolaryngeal excursion. The pt continues to be confused and had difficulties following 1-step commands for oral motor exam along with difficulties following cues to take small bites/  sips. The pt appears to be at mild-moderate risk of aspiration currently, with risk factors including decreased respiratory status, recent prolonged intubation, and decreased cognitive status, along with hx of dysphagia with recommendations for dysphagia 2/ thin liquids from MBS in 2015. However, pt demonstrated no overt difficulties with dysphagia 2 consistency at bedside. Recommend upgrading diet to dysphagia 2, continue with thin liquids via straw, meds whole with liquid or whole in puree if difficulties arise. Provide full supervision for assistance with feeding and to cue pt to take small bites/ sips. Additionally pt demonstrated difficulties maintaining upright posture and may need some repositioning during meals. Speech will continue to follow at least x1 for diet tolerance check.    Aspiration Risk  Mild- Moderate    Diet Recommendation Dysphagia 2 (Fine chop);Thin liquid   Liquid Administration via: Straw Medication Administration: Whole meds with liquid Supervision: Full supervision/cueing for compensatory strategies Compensations: Slow rate;Small sips/bites Postural Changes and/or Swallow Maneuvers: Seated upright 90 degrees    Other  Recommendations Oral Care Recommendations: Oral care BID   Follow Up Recommendations  Skilled Nursing facility    Frequency and Duration min 1 x/week  1 week   Pertinent Vitals/Pain n/a    SLP Swallow Goals     Swallow Study Prior Functional Status       General HPI: Pt is a 74 y.o. female with history of cerebral palsy- wheelchair bound  or bedbound. Presented to ED 1/5 with fever, decreased responsiveness and likely UTI, encephalopathy, and sepsis. Pt also has hx of PNA and dysphagia- MBS 03/18/14 revealed mild-mod dysphagia with silent flash penetration; recommendation at that time was dysphagia 2 diet/ thin liquids. Pt was intubated on vent support 1/5, extubated 1/12. CXR revealed bibasilar atelectasis 1/6. Pt placed on dysphagia 1/ thin  liquid diet on 1/12, bedside swallow eval ordered to consider advancement to dysphagia 2.  Type of Study: Bedside swallow evaluation Previous Swallow Assessment: MBS 02/2014- mild-mod oropharygneal dysphagia- flash penetration of thin liquids; recommended dysphagia 2/ thin Diet Prior to this Study: Dysphagia 1 (puree);Thin liquids Temperature Spikes Noted: No Respiratory Status: Nasal cannula History of Recent Intubation: Yes Length of Intubations (days): 6 days Date extubated: 12/31/14 Behavior/Cognition: Alert;Cooperative;Confused;Requires cueing Oral Cavity - Dentition: Edentulous Self-Feeding Abilities: Needs assist Patient Positioning: Upright in bed Baseline Vocal Quality: Clear Volitional Cough: Cognitively unable to elicit Volitional Swallow: Unable to elicit    Oral/Motor/Sensory Function Overall Oral Motor/Sensory Function: Other (comment) (difficult to assess- difficulties following commands)   Ice Chips Ice chips: Not tested   Thin Liquid Thin Liquid: Within functional limits Presentation: Straw    Nectar Thick Nectar Thick Liquid: Not tested   Honey Thick Honey Thick Liquid: Not tested   Puree Puree: Within functional limits Presentation: Spoon   Solid   GO    Solid: Within functional limits Presentation: Spoon (dysphagia 2 consistency)       Gilmore Laroche, Amy K, MA, CCC-SLP 01/03/2015,7:07 PM

## 2015-01-03 NOTE — Progress Notes (Signed)
Subjective: She is overall about the same. She is still mildly confused.  Objective: Vital signs in last 24 hours: Temp:  [97.6 F (36.4 C)-98.7 F (37.1 C)] 98.7 F (37.1 C) (01/15 0653) Pulse Rate:  [45-128] 84 (01/15 0653) Resp:  [20-25] 20 (01/15 0653) BP: (102-129)/(45-98) 122/69 mmHg (01/15 0653) SpO2:  [85 %-99 %] 92 % (01/15 0809) Weight:  [86.9 kg (191 lb 9.3 oz)] 86.9 kg (191 lb 9.3 oz) (01/15 0653) Weight change: -6.4 kg (-14 lb 1.8 oz) Last BM Date: 01/01/15  Intake/Output from previous day: 01/14 0701 - 01/15 0700 In: 360 [P.O.:360] Out: 650 [Urine:650]  PHYSICAL EXAM General appearance: alert, mild distress and Mildly confused Resp: rhonchi bilaterally Cardio: regular rate and rhythm, S1, S2 normal, no murmur, click, rub or gallop GI: soft, non-tender; bowel sounds normal; no masses,  no organomegaly Extremities: extremities normal, atraumatic, no cyanosis or edema  Lab Results:  Results for orders placed or performed during the hospital encounter of 12/24/14 (from the past 48 hour(s))  Glucose, capillary     Status: Abnormal   Collection Time: 01/01/15 11:17 AM  Result Value Ref Range   Glucose-Capillary 112 (H) 70 - 99 mg/dL   Comment 1 Notify RN   Glucose, capillary     Status: None   Collection Time: 01/01/15  4:10 PM  Result Value Ref Range   Glucose-Capillary 88 70 - 99 mg/dL   Comment 1 Notify RN   Glucose, capillary     Status: Abnormal   Collection Time: 01/01/15  8:01 PM  Result Value Ref Range   Glucose-Capillary 168 (H) 70 - 99 mg/dL   Comment 1 Notify RN   Glucose, capillary     Status: Abnormal   Collection Time: 01/02/15 12:10 AM  Result Value Ref Range   Glucose-Capillary 110 (H) 70 - 99 mg/dL   Comment 1 Notify RN   Glucose, capillary     Status: Abnormal   Collection Time: 01/02/15  4:19 AM  Result Value Ref Range   Glucose-Capillary 100 (H) 70 - 99 mg/dL   Comment 1 Notify RN   Basic metabolic panel     Status: Abnormal    Collection Time: 01/02/15  6:03 AM  Result Value Ref Range   Sodium 142 135 - 145 mmol/L    Comment: Please note change in reference range.   Potassium 2.7 (LL) 3.5 - 5.1 mmol/L    Comment: CRITICAL RESULT CALLED TO, READ BACK BY AND VERIFIED WITH: WAGONER,R AT 6:55AM ON 01/02/15 BY FESTERMAN,C Please note change in reference range.    Chloride 87 (L) 96 - 112 mEq/L   CO2 47 (HH) 19 - 32 mmol/L    Comment: CRITICAL RESULT CALLED TO, READ BACK BY AND VERIFIED WITH: WAGONER,R AT 6:55AM ON 01/02/15 BY FESTERMAN,C    Glucose, Bld 99 70 - 99 mg/dL   BUN 18 6 - 23 mg/dL   Creatinine, Ser <0.12 (L) 0.50 - 1.10 mg/dL   Calcium 8.5 8.4 - 03.7 mg/dL   GFR calc non Af Amer NOT CALCULATED >90 mL/min   GFR calc Af Amer NOT CALCULATED >90 mL/min    Comment: (NOTE) The eGFR has been calculated using the CKD EPI equation. This calculation has not been validated in all clinical situations. eGFR's persistently <90 mL/min signify possible Chronic Kidney Disease.    Anion gap 8 5 - 15  CBC with Differential     Status: Abnormal   Collection Time: 01/02/15  6:03 AM  Result  Value Ref Range   WBC 5.2 4.0 - 10.5 K/uL   RBC 4.95 3.87 - 5.11 MIL/uL   Hemoglobin 13.2 12.0 - 15.0 g/dL   HCT 44.8 36.0 - 46.0 %   MCV 90.5 78.0 - 100.0 fL   MCH 26.7 26.0 - 34.0 pg   MCHC 29.5 (L) 30.0 - 36.0 g/dL   RDW 15.9 (H) 11.5 - 15.5 %   Platelets 167 150 - 400 K/uL   Neutrophils Relative % 74 43 - 77 %   Neutro Abs 3.9 1.7 - 7.7 K/uL   Lymphocytes Relative 15 12 - 46 %   Lymphs Abs 0.8 0.7 - 4.0 K/uL   Monocytes Relative 11 3 - 12 %   Monocytes Absolute 0.6 0.1 - 1.0 K/uL   Eosinophils Relative 0 0 - 5 %   Eosinophils Absolute 0.0 0.0 - 0.7 K/uL   Basophils Relative 0 0 - 1 %   Basophils Absolute 0.0 0.0 - 0.1 K/uL  Glucose, capillary     Status: Abnormal   Collection Time: 01/02/15  7:37 AM  Result Value Ref Range   Glucose-Capillary 109 (H) 70 - 99 mg/dL   Comment 1 Documented in Chart    Comment 2  Notify RN   Glucose, capillary     Status: Abnormal   Collection Time: 01/02/15 11:35 AM  Result Value Ref Range   Glucose-Capillary 128 (H) 70 - 99 mg/dL   Comment 1 Documented in Chart    Comment 2 Notify RN   Glucose, capillary     Status: Abnormal   Collection Time: 01/02/15  4:50 PM  Result Value Ref Range   Glucose-Capillary 101 (H) 70 - 99 mg/dL   Comment 1 Documented in Chart    Comment 2 Notify RN   Glucose, capillary     Status: Abnormal   Collection Time: 01/02/15  7:59 PM  Result Value Ref Range   Glucose-Capillary 141 (H) 70 - 99 mg/dL   Comment 1 Notify RN   Glucose, capillary     Status: Abnormal   Collection Time: 01/03/15 12:09 AM  Result Value Ref Range   Glucose-Capillary 147 (H) 70 - 99 mg/dL   Comment 1 Notify RN   Glucose, capillary     Status: Abnormal   Collection Time: 01/03/15  4:42 AM  Result Value Ref Range   Glucose-Capillary 101 (H) 70 - 99 mg/dL  Basic metabolic panel     Status: Abnormal   Collection Time: 01/03/15  5:26 AM  Result Value Ref Range   Sodium 147 (H) 135 - 145 mmol/L    Comment: Please note change in reference range.   Potassium 3.0 (L) 3.5 - 5.1 mmol/L    Comment: Please note change in reference range.   Chloride 91 (L) 96 - 112 mEq/L   CO2 47 (HH) 19 - 32 mmol/L    Comment: CRITICAL RESULT CALLED TO, READ BACK BY AND VERIFIED WITH: FORTE,L AT 7:20AM ON 01/03/15 BY FESTERMAN,C    Glucose, Bld 90 70 - 99 mg/dL   BUN 18 6 - 23 mg/dL   Creatinine, Ser <0.30 (L) 0.50 - 1.10 mg/dL   Calcium 8.8 8.4 - 10.5 mg/dL   GFR calc non Af Amer NOT CALCULATED >90 mL/min   GFR calc Af Amer NOT CALCULATED >90 mL/min    Comment: (NOTE) The eGFR has been calculated using the CKD EPI equation. This calculation has not been validated in all clinical situations. eGFR's persistently <90 mL/min signify possible  Chronic Kidney Disease.    Anion gap 9 5 - 15  Glucose, capillary     Status: Abnormal   Collection Time: 01/03/15  8:21 AM  Result  Value Ref Range   Glucose-Capillary 111 (H) 70 - 99 mg/dL    ABGS No results for input(s): PHART, PO2ART, TCO2, HCO3 in the last 72 hours.  Invalid input(s): PCO2 CULTURES Recent Results (from the past 240 hour(s))  Culture, blood (routine x 2)     Status: None   Collection Time: 12/24/14  3:30 PM  Result Value Ref Range Status   Specimen Description BLOOD RIGHT WRIST DRAWN BY RN [id number]  Final   Special Requests BOTTLES DRAWN AEROBIC AND ANAEROBIC 6CC  Final   Culture NO GROWTH 6 DAYS  Final   Report Status 12/30/2014 FINAL  Final  Culture, blood (routine x 2)     Status: None   Collection Time: 12/24/14  3:30 PM  Result Value Ref Range Status   Specimen Description BLOOD LEFT HAND DRAWN BY RN [id number]  Final   Special Requests BOTTLES DRAWN AEROBIC AND ANAEROBIC 6CC   Final   Culture   Final    STAPHYLOCOCCUS SPECIES (COAGULASE NEGATIVE) Note: THE SIGNIFICANCE OF ISOLATING THIS ORGANISM FROM A SINGLE VENIPUNCTURE CANNOT BE PREDICTED WITHOUT FURTHER CLINICAL AND CULTURE CORRELATION. SUSCEPTIBILITIES AVAILABLE ONLY ON REQUEST. Note: Gram Stain Report Called to,Read Back By and Verified With: PHILLIPS C. AT 1610 ON 12/25/14 BY BAUGHAM M. Performed at [facility] Performed at [facility]    Report Status 12/27/2014 FINAL  Final  Urine culture     Status: None   Collection Time: 12/24/14  3:40 PM  Result Value Ref Range Status   Specimen Description URINE, CATHETERIZED  Final   Special Requests NONE  Final   Colony Count NO GROWTH Performed at [facility]   Final   Culture NO GROWTH Performed at [facility]   Final   Report Status 12/25/2014 FINAL  Final  MRSA PCR Screening     Status: Abnormal   Collection Time: 12/24/14  9:20 PM  Result Value Ref Range Status   MRSA by PCR POSITIVE (A) NEGATIVE Final    Comment:        The GeneXpert MRSA Assay (FDA approved for NASAL specimens only), is one component of a comprehensive MRSA  colonization surveillance program. It is not intended to diagnose MRSA infection nor to guide or [place] [address] BY FORSYTH K    Studies/Results: No results found.  Medications:  Prior to Admission:  Prescriptions prior to admission  Medication Sig Dispense Refill Last Dose  . acetaminophen (TYLENOL) 325 MG tablet Take 2 tablets (650 mg total) by mouth every 6 (six) hours as needed for mild pain (or Fever >/= 101).   unknown  . albuterol (PROVENTIL HFA;VENTOLIN HFA) 108 (90 BASE) MCG/ACT inhaler Inhale 2 puffs into the lungs every 4 (four) hours as needed. Shortness of breath 1 Inhaler 12 unknown  . albuterol (PROVENTIL) (2.5 MG/3ML) 0.083% nebulizer solution Take 2.5 mg by nebulization 4 (four) times daily.   12/24/2014 at 1200  . ALPRAZolam (XANAX) 1 MG tablet Take 1 mg by mouth 4 (four) times daily.    12/24/2014 at 1400  . Amino Acids-Protein Hydrolys (FEEDING SUPPLEMENT, PRO-STAT SUGAR FREE 64,) LIQD Take 30 mLs by mouth 2 (two) times daily with a meal.   12/24/2014 at 800a  . aspirin 325 MG tablet Take 1 tablet (325  mg total) by mouth daily. 30 tablet 12 12/24/2014 at [place]  . atorvastatin (LIPITOR) 40 MG tablet Take 40 mg by mouth daily.   12/24/2014 at [place]  . baclofen (LIORESAL) 10 MG tablet Take 5 mg by mouth 3 (three) times daily.   12/24/2014 at 1400  . cefTRIAXone (ROCEPHIN) 1 G injection Inject 1 g into the muscle once. Last administered on 12/23/2014 at 22:00 IM to right hip   12/23/2014  . cholecalciferol (VITAMIN D) 1000 UNITS tablet Take 1,000 Units by mouth daily.   12/24/2014 at [place]  . clopidogrel (PLAVIX) 75 MG tablet Take 1 tablet (75 mg total) by mouth daily with breakfast.   12/24/2014 at [place]  . Cranberry 450 MG CAPS Take 1 capsule by mouth daily.   12/24/2014 at 900a  . guaiFENesin (MUCINEX) 600 MG 12 hr tablet Take 600 mg by mouth 2 (two) times daily.   12/24/2014 at 800a  . haloperidol (HALDOL) 0.5 MG tablet Take 0.5 mg by mouth 2 (two) times daily.     12/24/2014 at 900a  . levothyroxine (SYNTHROID, LEVOTHROID) 88 MCG tablet Take 1 tablet (88 mcg total) by mouth daily before breakfast. 30 tablet 12 12/24/2014 at 600a  . loperamide (IMODIUM) 2 MG capsule Take 1 capsule (2 mg total) by mouth as needed for diarrhea or loose stools. 30 capsule 0 unknown  . loratadine (CLARITIN) 10 MG tablet Take 10 mg by mouth daily.   12/24/2014 at [place]  . nitroGLYCERIN (NITROSTAT) 0.4 MG SL tablet Place 1 tablet (0.4 mg total) under the tongue every 5 (five) minutes as needed for chest pain. 25 tablet 12 unknown  . pantoprazole (PROTONIX) 40 MG tablet Take 1 tablet (40 mg total) by mouth daily. 30 tablet 12 12/24/2014 at 600a  . polyethylene glycol (MIRALAX / GLYCOLAX) packet Take 17 g by mouth daily.   12/24/2014 at 900a  . potassium chloride SA (K-DUR,KLOR-CON) 20 MEQ tablet Take 10 mEq by mouth daily.    12/24/2014 at [place]  . predniSONE (DELTASONE) 5 MG tablet Take 5 mg by mouth daily with breakfast.   12/24/2014 at [place]  . ranolazine (RANEXA) 500 MG 12 hr tablet Take 1 tablet (500 mg total) by mouth 2 (two) times daily. 60 tablet 3 12/24/2014 at [place]  . saccharomyces boulardii (FLORASTOR) 250 MG capsule Take 250 mg by mouth 2 (two) times daily.   12/24/2014 at 800a  . sertraline (ZOLOFT) 100 MG tablet Take 1 tablet (100 mg total) by mouth daily.   12/24/2014 at 900a  . temazepam (RESTORIL) 7.5 MG capsule Take 7.5 mg by mouth at bedtime as needed for sleep.   unknown  . traMADol (ULTRAM) 50 MG tablet Take 50 mg by mouth every 6 (six) hours as needed for moderate pain.   unknown  . furosemide (LASIX) 20 MG tablet Take 20 mg by mouth daily.   Taking  . trolamine salicylate (ASPERCREME) 10 % cream Apply topically as needed for muscle pain. (Patient not taking: Reported on 12/24/2014) 85 g 0 Taking   Scheduled: . albuterol  2.5 mg Nebulization QID  . enoxaparin (LOVENOX) injection  40 mg Subcutaneous Q24H  . feeding supplement (GLUCERNA SHAKE)  237 mL Oral BID BM  . furosemide  40  mg Intravenous Daily  . haloperidol  0.5 mg Oral BID  . insulin aspart  0-20 Units Subcutaneous 6 times per day  . methylPREDNISolone (SOLU-MEDROL) injection  40 mg Intravenous Q12H  . pantoprazole  40 mg Oral Daily  .  potassium chloride  20 mEq Oral BID  . sodium chloride  10-40 mL Intracatheter Q12H   Continuous:  TJQ:[id number] chloride, acetaminophen, albuterol, ALPRAZolam, fentaNYL, ondansetron (ZOFRAN) IV, sodium chloride  Assesment: She was admitted with acute on chronic respiratory failure requiring intubation and mechanical ventilation. She appeared to be septic. She was thought to have urinary tract infection but her urine had no growth. She has been able to be extubated as transferred to the floor and is improving. Her steroids will be switched to prednisone she will continue other treatments and I anticipate that she should be ready to return to the skilled care facility tomorrow Active Problems:   Morbid obesity   Cerebral palsy   Hypertension   Sepsis   Acute and chronic respiratory failure with hypercapnia   Encephalopathy, metabolic   UTI (urinary tract infection)    Plan: Possibly return to skilled care facility tomorrow    LOS: 10 days   Ayyub Krall L 01/03/2015, 8:51 AM

## 2015-01-03 NOTE — Clinical Social Work Note (Signed)
CSW spoke with Jackelyn Poling at [facility] and she states that they have filled pt's bed and no other beds are available. CSW notified pt's sister, Everline and she is disappointed, but understanding. Discussed new placement and she agreed to bed search in [place]. Referral sent.   Erick Blinks, [place]

## 2015-01-03 NOTE — Progress Notes (Signed)
CRITICAL VALUE ALERT  Critical value received:  CO2 47  Date of notification:  01/03/2015   Time of notification:  07:20  Critical value read back:Yes.    Nurse who received alert:  Jovita Kussmaul   MD notified (1st page):  Luan Pulling  Time of first page:  07:35  MD notified (2nd page):  Time of second page:  Responding MD:  Luan Pulling  Time MD responded:  07:36

## 2015-01-03 NOTE — Clinical Social Work Placement (Signed)
Clinical Social Work Department CLINICAL SOCIAL WORK PLACEMENT NOTE 01/03/2015  Patient:  [name], [name]  Account Number:  [id number] Admit date:  12/24/2014  Clinical Social Worker:  Benay Pike, LCSW  Date/time:  01/03/2015 04:50 PM  Clinical Social Work is seeking post-discharge placement for this patient at the following level of care:   [place]   (*CSW will update this form in Epic as items are completed)   01/03/2015  Patient/family provided with [place] Department of Clinical Social Work's list of facilities offering this level of care within the geographic area requested by the patient (or if unable, by the patient's family).  01/03/2015  Patient/family informed of their freedom to choose among providers that offer the needed level of care, that participate in Medicare, Medicaid or managed care program needed by the patient, have an available bed and are willing to accept the patient.  01/03/2015  Patient/family informed of [facility]' ownership interest in [facility], as well as of the fact that they are under no obligation to receive care at this facility.  PASARR submitted to EDS on  PASARR number received on   FL2 transmitted to all facilities in geographic area requested by pt/family on  01/03/2015 FL2 transmitted to all facilities within larger geographic area on   Patient informed that his/her managed care company has contracts with or will negotiate with  certain facilities, including the following:     Patient/family informed of bed offers received:   Patient chooses bed at  Physician recommends and patient chooses bed at    Patient to be transferred to  on   Patient to be transferred to facility by  Patient and family notified of transfer on  Name of family member notified:    The following physician request were entered in Epic:   Additional Comments: Pt has existing pasarr.  Benay Pike, [place]

## 2015-01-03 NOTE — Clinical Social Work Note (Signed)
CSW advised Debbie at [facility] that patient may be discharged tomorrow.  Jackelyn Poling stated that she would check with the hospital in the morning to identify patient was going to be discharged.  Ambrose Pancoast, [place]

## 2015-01-04 LAB — GLUCOSE, CAPILLARY
Glucose-Capillary: 111 mg/dL — ABNORMAL HIGH (ref 70–99)
Glucose-Capillary: 114 mg/dL — ABNORMAL HIGH (ref 70–99)
Glucose-Capillary: 120 mg/dL — ABNORMAL HIGH (ref 70–99)
Glucose-Capillary: 125 mg/dL — ABNORMAL HIGH (ref 70–99)
Glucose-Capillary: 145 mg/dL — ABNORMAL HIGH (ref 70–99)

## 2015-01-04 MED ORDER — GLUCERNA SHAKE PO LIQD: 237.0000 mL | Freq: Two times a day (BID) | ORAL | Status: AC

## 2015-01-04 MED ORDER — FUROSEMIDE 20 MG PO TABS: 40.0000 mg | ORAL_TABLET | Freq: Every day | ORAL | Status: AC

## 2015-01-04 MED ORDER — CEFUROXIME AXETIL 500 MG PO TABS: 500.0000 mg | ORAL_TABLET | Freq: Two times a day (BID) | ORAL | Status: DC

## 2015-01-04 MED ORDER — INSULIN ASPART 100 UNIT/ML ~~LOC~~ SOLN: 0.0000 [IU] | Freq: Three times a day (TID) | SUBCUTANEOUS | Status: DC

## 2015-01-04 MED ORDER — POTASSIUM CHLORIDE CRYS ER 20 MEQ PO TBCR: 40.0000 meq | EXTENDED_RELEASE_TABLET | Freq: Two times a day (BID) | ORAL | Status: AC

## 2015-01-04 MED ORDER — PREDNISONE 20 MG PO TABS: 40.0000 mg | ORAL_TABLET | Freq: Every day | ORAL | Status: DC

## 2015-01-04 NOTE — Progress Notes (Addendum)
MD called and informed that patient has rectal temp of 100.3 and is clammy, orders given to cancel discharge. [facility] called and informed that patient would not be discharged today.

## 2015-01-04 NOTE — Progress Notes (Signed)
She is doing better and has a bed available at the skilled care facility so she will be discharged today

## 2015-01-04 NOTE — Discharge Summary (Addendum)
Physician Discharge Summary  Patient ID: [name] MRN: [id number] DOB/AGE: Nov 08, 1941 74 y.o. 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     albuterol 108 (90 BASE) MCG/ACT inhaler  Commonly known as:  PROVENTIL HFA;VENTOLIN HFA  Inhale 2 puffs into the lungs every 4 (four) hours as needed. Shortness of breath     albuterol (2.5 MG/3ML) 0.083% nebulizer solution  Commonly known as:  PROVENTIL  Take 2.5 mg by nebulization 4 (four) times daily.     ALPRAZolam 1 MG tablet  Commonly known as:  XANAX  Take 1 mg by mouth 4 (four) times daily.     aspirin 325 MG tablet  Take 1 tablet (325 mg total) by mouth daily.     atorvastatin 40 MG tablet  Commonly known as:  LIPITOR  Take 40 mg by mouth daily.     baclofen 10 MG tablet  Commonly known as:  LIORESAL  Take 5 mg by mouth 3 (three) times daily.     cefUROXime 500 MG tablet  Commonly known as:  CEFTIN  Take 1 tablet (500 mg total) by mouth 2 (two) times daily with a meal.     cholecalciferol 1000 UNITS tablet  Commonly known as:  VITAMIN D  Take 1,000  Units by mouth daily.     clopidogrel 75 MG tablet  Commonly known as:  PLAVIX  Take 1 tablet (75 mg total) by mouth daily with breakfast.     Cranberry 450 MG Caps  Take 1 capsule by mouth daily.     feeding supplement (GLUCERNA SHAKE) Liqd  Take 237 mLs by mouth 2 (two) times daily between meals.     furosemide 20 MG tablet  Commonly known as:  LASIX  Take 2 tablets (40 mg total) by mouth daily.     guaiFENesin 600 MG 12 hr tablet  Commonly known as:  MUCINEX  Take 600 mg by mouth 2 (two) times daily.     haloperidol 0.5 MG tablet  Commonly known as:  HALDOL  Take 0.5 mg by mouth 2 (two) times daily.     insulin aspart 100 UNIT/ML injection  Commonly known as:  novoLOG  Inject 0-20 Units into the skin 3 (three) times daily with meals.     levothyroxine 88 MCG tablet  Commonly known as:  SYNTHROID, LEVOTHROID  Take 1 tablet (88 mcg total) by mouth daily before breakfast.     loperamide 2 MG capsule  Commonly known as:  IMODIUM  Take 1 capsule (2 mg  total) by mouth as needed for diarrhea or loose stools.     loratadine 10 MG tablet  Commonly known as:  CLARITIN  Take 10 mg by mouth daily.     nitroGLYCERIN 0.4 MG SL tablet  Commonly known as:  NITROSTAT  Place 1 tablet (0.4 mg total) under the tongue every 5 (five) minutes as needed for chest pain.     pantoprazole 40 MG tablet  Commonly known as:  PROTONIX  Take 1 tablet (40 mg total) by mouth daily.     polyethylene glycol packet  Commonly known as:  MIRALAX / GLYCOLAX  Take 17 g by mouth daily.     potassium chloride SA 20 MEQ tablet  Commonly known as:  K-DUR,KLOR-CON  Take 2 tablets (40 mEq total) by mouth 2 (two) times daily.     predniSONE 20 MG tablet  Commonly known as:  DELTASONE  Take 2 tablets (40 mg total) by mouth daily with breakfast.     ranolazine 500 MG 12 hr tablet  Commonly known as:  RANEXA  Take 1 tablet (500 mg total) by mouth 2 (two) times daily.     saccharomyces boulardii 250  MG capsule  Commonly known as:  FLORASTOR  Take 250 mg by mouth 2 (two) times daily.     sertraline 100 MG tablet  Commonly known as:  ZOLOFT  Take 1 tablet (100 mg total) by mouth daily.     temazepam 7.5 MG capsule  Commonly known as:  RESTORIL  Take 7.5 mg by mouth at bedtime as needed for sleep.     traMADol 50 MG tablet  Commonly known as:  ULTRAM  Take 50 mg by mouth every 6 (six) hours as needed for moderate pain.        Discharged Condition: Improved    Consults: None  Significant Diagnostic Studies: Ct Chest Wo Contrast  12/24/2014   CLINICAL DATA:  Fever, hypoxia  EXAM: CT CHEST WITHOUT CONTRAST  TECHNIQUE: Multidetector CT imaging of the chest was performed following the standard protocol without IV contrast.  COMPARISON:  Plain film from earlier in the same day  FINDINGS: The lungs are with well aerated bilaterally with evidence of vascular congestion and some patchy ground-glass changes likely related to parenchymal edema. Mild lower lobe atelectatic changes are seen. No sizable effusion is noted. The hilar and mediastinal structures show no significant lymphadenopathy. The pulmonary artery is mildly prominent consistent with the vascular congestion.  Scanning into the upper abdomen reveals no acute abnormality. Bony structures show degenerative change of the thoracic spine. Changes consistent with a vertebral hemangioma are noted at T8. At the inferior aspect of L1 on the last image there is a lucency within the L1 vertebral body. This is stable from a prior exam from 03/13/2014 and likely represent a second hemangioma.  IMPRESSION: Vertebral hemangiomas.  Mild vascular congestion and bibasilar atelectatic changes similar to that seen on the prior plain film examination.   Electronically Signed   By: Inez Catalina M.D.   On: 12/24/2014 18:06   Nm Myocar Multi W/spect W/wall Motion / Ef  12/18/2014   CLINICAL DATA:  Patient is a 74 yo with CP Test to evaluate, r/o ischemia.   EXAM: MYOCARDIAL IMAGING WITH SPECT (REST AND PHARMACOLOGIC-STRESS)  GATED LEFT VENTRICULAR WALL MOTION STUDY  LEFT VENTRICULAR EJECTION FRACTION  TECHNIQUE: Standard myocardial SPECT imaging was performed after resting intravenous injection of 10 mCi Tc-83m sestamibi. Subsequently, intravenous infusion of Lexiscan was performed under the  supervision of the Cardiology staff. At peak effect of the drug, 30 mCi Tc-22m sestamibi was injected intravenously and standard myocardial SPECT imaging was performed. Quantitative gated imaging was also performed to evaluate left ventricular wall motion, and estimate left ventricular ejection fraction.  COMPARISON:  None.  FINDINGS: Stress data: Baseline EKG SR 89 bpm With infusion of lexiscan there were no ST changes to suggest ischemia  Nuclear data: Review of the raw data shows extensive soft tissue surrounding heart (diaphragm, breast, arm).  In the initial stress images there was thinning with decreased tracer activity in the inferolateral wall (base,mid), inferior wall (base), inferoseptal wall (base) and apex.  In the recovery images there was no significant change  On gating LVEF was calculated at 72% with normal wall thickening.  IMPRESSION: Lexiscan Sestamibi: Electrically negative for ischemia MIBI scan with inferior, inferolateral, inferoseptal walls consistent with probable soft tissue attenuation (diaphragm); cannot completely exclude subendocardial scar No ischemia. LVEF 72% Overall low risk scan.   Electronically Signed   By: Dorris Carnes M.D.   On: 12/18/2014 15:47   Dg Chest Port 1 View  12/31/2014   CLINICAL DATA:  Intubation.  EXAM: PORTABLE CHEST - 1 VIEW  COMPARISON:  12/29/2014.  FINDINGS: Endotracheal tube and NG tube in stable position. Right PICC line in stable position . Persistent cardiomegaly pulmonary venous congestion interstitial prominence with small bilateral pleural effusions consistent with congestive heart failure. Superimposed pneumonitis  cannot be excluded. No pneumothorax.  IMPRESSION: 1. Lines and tubes in stable position. 2. Persistent changes of congestive heart failure or from interstitial edema and bilateral small pleural effusions. Superimposed pneumonia cannot be excluded.   Electronically Signed   By: Marcello Moores  Register   On: 12/31/2014 07:22   Dg Chest Port 1 View  12/29/2014   CLINICAL DATA:  74 year old female with respiratory failure.  EXAM: PORTABLE CHEST - 1 VIEW  COMPARISON:  Chest x-ray 12/28/2014.  FINDINGS: An endotracheal tube is in place with tip 5.0 cm above the carina. A nasogastric tube is seen extending into the stomach, however, the tip of the nasogastric tube extends below the lower margin of the image. Lung volumes are low. Bibasilar opacities compatible with areas of subsegmental atelectasis and small bilateral pleural effusions. There is cephalization of the pulmonary vasculature and slight indistinctness of the interstitial markings suggestive of mild pulmonary edema. Mild to moderate cardiomegaly. The patient is rotated to the right on today's exam, resulting in distortion of the mediastinal contours and reduced diagnostic sensitivity and specificity for mediastinal pathology.  IMPRESSION: 1. Support apparatus, as above. 2. The appearance of the chest again suggests underlying congestive heart failure, as above.   Electronically Signed   By: Vinnie Langton M.D.   On: 12/29/2014 09:21   Dg Chest Port 1 View  12/28/2014   CLINICAL DATA:  OG-tube placement  EXAM: PORTABLE CHEST - 1 VIEW  COMPARISON:  12/28/2014 at 0725 hr  FINDINGS: Cardiomegaly with pulmonary vascular congestion and suspected mild interstitial edema. Mild patchy bilateral lower lobe opacities, likely atelectasis. Possible small left pleural effusion. No pneumothorax.  Endotracheal tube terminates 1.5 cm above the carina.  Enteric tube terminates in the gastric cardia.  IMPRESSION: Cardiomegaly with suspected mild interstitial edema and possible  small left pleural effusion.  Endotracheal tube terminates 1.5 cm above the carina.  Enteric tube terminates in the gastric cardia.   Electronically Signed   By: Julian Hy M.D.   On: 12/28/2014 14:54   Dg Chest [facility]  12/28/2014   CLINICAL DATA:  Respiratory distress. Ventilated patient. Subsequent encounter.  EXAM: PORTABLE CHEST - 1 VIEW  COMPARISON:  Chest radiograph performed 12/27/2014  FINDINGS: The patient's endotracheal tube is seen ending 2 cm above the carina. A right PICC is noted likely extending to the inferior cavoatrial junction. This could be retracted 4 cm, as deemed clinically appropriate.  The lungs are hypoexpanded. Vascular crowding and vascular congestion are noted. Left basilar airspace opacification may reflect atelectasis or possibly pneumonia. A small left pleural effusion is suspected. No pneumothorax is seen.  The cardiomediastinal silhouette is borderline normal in size. No acute osseous abnormalities are identified.  IMPRESSION: 1. Right PICC appears to extend to the inferior cavoatrial junction. This could be retracted 4 cm, as deemed clinically appropriate. 2. Lungs hypoexpanded. Vascular congestion noted. Left basilar airspace opacification may reflect atelectasis or possibly pneumonia. Small left pleural effusion suspected.  These results were called by telephone at the time of interpretation on 12/28/2014 at 8:39 am to Nursing at the [facility] ICU, who verbally acknowledged these results.   Electronically Signed   By: Garald Balding M.D.   On: 12/28/2014 08:39   Dg Chest Port 1 View  12/27/2014   CLINICAL DATA:  Respiratory failure.  EXAM: PORTABLE CHEST - 1 VIEW  COMPARISON:  12/26/2014.  FINDINGS: Endotracheal tube and right PICC line in stable position. Mediastinal and hilar structures unremarkable. Mild persistent bibasilar atelectasis and/or infiltrates. Stable cardiomegaly. Small left pleural effusion cannot be excluded. No pneumothorax. No acute osseus  abnormality .  IMPRESSION: 1. Lines and tubes in stable position. 2. Mild persistent bibasilar atelectasis and/or infiltrates.   Electronically Signed   By: Marcello Moores  Register   On: 12/27/2014 07:46   Dg Chest Port 1 View  12/26/2014   CLINICAL DATA:  Respiratory failure  EXAM: PORTABLE CHEST - 1 VIEW  COMPARISON:  12/25/2014  FINDINGS: Cardiac shadow is stable. An endotracheal tube is again seen approximately 3.5 cm above the carina. A right jugular line it has been removed in the interval. A right-sided PICC line is now noted with the tip at the cavoatrial junction. No focal infiltrate is seen. Very minimal right basilar atelectasis is noted.  IMPRESSION: Stable right basilar atelectasis.  Interval removal of right jugular central line.  New right-sided PICC line in satisfactory position.   Electronically Signed   By: Inez Catalina M.D.   On: 12/26/2014 16:05   Dg Chest Port 1 View  12/25/2014   CLINICAL DATA:  UTI, respiratory failure, fever, intubation  EXAM: PORTABLE CHEST - 1 VIEW  COMPARISON:  Portable exam 1405 hr compared to 0525 hr  FINDINGS: Tip of endotracheal tube 13 mm from carina.  RIGHT jugular central venous catheter tip projects over RIGHT atrium, consider withdrawal 4 cm for positioning at cavoatrial junction.  Enlargement of cardiac silhouette with pulmonary vascular congestion.  Bibasilar atelectasis and slightly increased mild perihilar edema.  No gross pleural effusion or pneumothorax.  IMPRESSION: Tip of RIGHT jugular central venous catheter projects over RIGHT atrium, recommend withdrawal 4 cm.  Increased pulmonary edema.  Bibasilar atelectasis.  Tip of ET tube has advanced and is now 1.3 cm above carina, consider withdrawal 1-2 cm.  Findings called to [facility] RN in ICU on 12/25/2014 at 1430 hr.   Electronically Signed   By: Lavonia Dana M.D.   On: 12/25/2014 14:31   Dg Chest Port 1 View  12/25/2014   CLINICAL DATA:  Respiratory failure. Ventilated patient. Morning portable.  EXAM: PORTABLE  CHEST - 1 VIEW  COMPARISON:  12/24/2014  FINDINGS: Endotracheal tube tip measures 4.8 cm above the carina. Right central venous catheter projects over the cavoatrial junction. No pneumothorax. Cardiac enlargement. Patchy infiltration or atelectasis in the lung bases. Mild vascular congestion. No definite edema. No blunting of costophrenic angles.  IMPRESSION: Appliances appear in satisfactory position. Cardiac enlargement with mild vascular congestion. Atelectasis in the lung bases. No significant changes since yesterday.   Electronically Signed   By: Lucienne Capers M.D.   On: 12/25/2014 06:06   Dg Chest Port 1 View  12/24/2014   CLINICAL DATA:  Shortness of breath and poor oxygenation.  EXAM: PORTABLE CHEST - 1 VIEW  COMPARISON:  11/12/2014  FINDINGS: The heart is enlarged but stable. There is central vascular congestion and pulmonary edema with small bilateral pleural effusions consistent with CHF.  IMPRESSION: CHF.   Electronically Signed   By: Kalman Jewels M.D.   On: 12/24/2014 15:48   Dg Chest Port 1v Same Day  12/24/2014   CLINICAL DATA:  Endotracheal tube and central line placement. Initial encounter.  EXAM: PORTABLE CHEST - 1 VIEW SAME DAY  COMPARISON:  Chest radiograph performed earlier today at 3:38 p.m., and CT of the chest performed earlier today at 6:02 p.m.  FINDINGS: The patient endotracheal tube is seen ending 2-3 cm above the carina. The right IJ line is noted crossing the midline and extending into the left brachiocephalic vein. This could be retracted 10 cm and readvanced.  The lungs are hypoexpanded. Vascular crowding and vascular congestion noted. Bibasilar airspace opacities could reflect mild interstitial edema. No definite pleural effusion or pneumothorax is seen.  The cardiomediastinal silhouette is borderline enlarged. No acute osseous abnormalities are seen.  IMPRESSION: 1. Endotracheal tube seen ending 2-3 cm above the carina. 2. Right IJ line noted crossing the midline and  extending into the left brachiocephalic vein. This could be retracted 10 cm and readvanced. 3. Lungs hypoexpanded. Vascular congestion and borderline cardiomegaly noted. Bibasilar airspace opacities could reflect mild interstitial edema.  These results were called by telephone at the time of interpretation on 12/24/2014 at 8:12 pm to Dr. Tanna Furry, who verbally acknowledged these results.   Electronically Signed   By: Garald Balding M.D.   On: 12/24/2014 20:12    Lab Results: Basic Metabolic Panel:  Recent Labs  01/02/15 0603 01/03/15 0526  NA 142 147*  K 2.7* 3.0*  CL 87* 91*  CO2 47* 47*  GLUCOSE 99 90  BUN 18 18  CREATININE <0.30* <0.30*  CALCIUM 8.5 8.8   Liver Function Tests: No results for input(s): AST, ALT, ALKPHOS, BILITOT, PROT, ALBUMIN in the last 72 hours.   CBC:  Recent Labs  01/02/15 0603  WBC 5.2  NEUTROABS 3.9  HGB 13.2  HCT 44.8  MCV 90.5  PLT 167    No results found for this or any previous visit (from the past 240 hour(s)).   Hospital Course: This is a 74 year old who lives at a skilled care facility and was brought to the emergency department because of increasing problems with shortness of breath and altered mental status. She was treated in the emergency department and eventually required intubation and mechanical ventilation. She was very agitated this impacted her ability to get her off of the ventilator. She had volume resuscitation was placed on IV antibiotics and appeared to be somewhat volume overloaded and was treated with Lasix. This improved her situation she was able to be extubated  and then was later transferred out of the ICU. She was septic but did not have any positive blood or urine cultures. She may have had healthcare associated pneumonia but it is difficult to be certain because she has chronic changes on her chest x-ray. She was getting ready for discharge when she developed fever and had blood cultures and urine culture done. Blood  culture showed a contaminated with a coagulase-negative staph and her urine culture grew yeast. She developed increasing problems with respiratory distress and had to be reintubated and placed on mechanical ventilation again. She came off the ventilator much more quickly this time and she will has was started on Diflucan for the yeast. She received about 4 more days of IV antibiotics for potential pneumonia. She developed diarrhea and had C. difficile colitis. She was treated for that with Flagyl. By the time of discharge she was back at baseline.  she improved and was ready for discharge to skilled care facility. Foley catheter was discontinued and PICC line was discontinued She will continue on a larger dose of Lasix. She will need sliding scale insulin by facility protocol and she is on prednisone so she should be on a resistant scale by the facility protocol. She should have Accu-Cheks before meals and at bedtime. She should have PT and OT and speech as needed. At the time of her first discharge it was planned for her to take a short course of oral antibiotics but that has been discontinued  Discharge Exam: Blood pressure 117/68, pulse 88, temperature 97.5 F (36.4 C), temperature source Oral, resp. rate 20, height 5\' 6"  (1.676 m), weight 87 kg (191 lb 12.8 oz), SpO2 93 %. She is awake and alert still mildly confused. Her chest is clear. Heart is regular  Disposition: To skilled care facility. She will be on sliding scale as mentioned and she will have prednisone 40 mg daily 3 days then 30 mg daily 3 days then 20 mg daily 3 days and then she will remain on 10 mg daily which is a chronic medication for her. She should be on a carbohydrate modified low-salt diet. She will be on oxygen at 2 L with increase in liter flow as needed to keep O2 saturation greater than 92% she will need Flagyl 500 mg by mouth 3 times a day for 10 more days and she will need Diflucan 100 mg by mouth daily for 10 more days       Discharge Instructions    Discharge to SNF when bed available    Complete by:  As directed              Signed: Mariaisabel Bodiford L   01/04/2015, 10:01 AM

## 2015-01-04 NOTE — Progress Notes (Signed)
Report called to [facility] LPN at [facility].

## 2015-01-05 ENCOUNTER — Inpatient Hospital Stay (HOSPITAL_COMMUNITY): Payer: Medicare Other

## 2015-01-05 LAB — CBC WITH DIFFERENTIAL/PLATELET
Basophils Absolute: 0 10*3/uL (ref 0.0–0.1)
Basophils Relative: 0 % (ref 0–1)
EOS ABS: 0.1 10*3/uL (ref 0.0–0.7)
Eosinophils Relative: 1 % (ref 0–5)
HCT: 44.7 % (ref 36.0–46.0)
Hemoglobin: 12.6 g/dL (ref 12.0–15.0)
LYMPHS ABS: 0.8 10*3/uL (ref 0.7–4.0)
Lymphocytes Relative: 12 % (ref 12–46)
MCH: 26.5 pg (ref 26.0–34.0)
MCHC: 28.2 g/dL — ABNORMAL LOW (ref 30.0–36.0)
MCV: 93.9 fL (ref 78.0–100.0)
MONO ABS: 0.2 10*3/uL (ref 0.1–1.0)
Monocytes Relative: 3 % (ref 3–12)
NEUTROS PCT: 84 % — AB (ref 43–77)
Neutro Abs: 5.7 10*3/uL (ref 1.7–7.7)
Platelets: 175 10*3/uL (ref 150–400)
RBC: 4.76 MIL/uL (ref 3.87–5.11)
RDW: 16.2 % — AB (ref 11.5–15.5)
WBC: 6.7 10*3/uL (ref 4.0–10.5)

## 2015-01-05 LAB — COMPREHENSIVE METABOLIC PANEL
ALK PHOS: 45 U/L (ref 39–117)
ALT: 17 U/L (ref 0–35)
AST: 16 U/L (ref 0–37)
Albumin: 3.8 g/dL (ref 3.5–5.2)
Anion gap: 8 (ref 5–15)
BILIRUBIN TOTAL: 0.9 mg/dL (ref 0.3–1.2)
BUN: 23 mg/dL (ref 6–23)
CO2: 36 mmol/L — ABNORMAL HIGH (ref 19–32)
CREATININE: 0.39 mg/dL — AB (ref 0.50–1.10)
Calcium: 9.1 mg/dL (ref 8.4–10.5)
Chloride: 99 mEq/L (ref 96–112)
GFR calc Af Amer: >90 mL/min (ref >90)
GFR calc non Af Amer: >90 mL/min (ref >90)
Glucose, Bld: 165 mg/dL — ABNORMAL HIGH (ref 70–99)
Potassium: 4.1 mmol/L (ref 3.5–5.1)
Sodium: 143 mmol/L (ref 135–145)
TOTAL PROTEIN: 6.3 g/dL (ref 6.0–8.3)

## 2015-01-05 LAB — BASIC METABOLIC PANEL
ANION GAP: 6 (ref 5–15)
Anion gap: 7 (ref 5–15)
BUN: 26 mg/dL — ABNORMAL HIGH (ref 6–23)
BUN: 23 mg/dL (ref 6–23)
CALCIUM: 8.4 mg/dL (ref 8.4–10.5)
CO2: 37 mmol/L — ABNORMAL HIGH (ref 19–32)
CO2: 40 mmol/L — AB (ref 19–32)
CREATININE: 0.39 mg/dL — AB (ref 0.50–1.10)
Calcium: 8.6 mg/dL (ref 8.4–10.5)
Chloride: 100 mEq/L (ref 96–112)
Chloride: 101 mEq/L (ref 96–112)
Creatinine, Ser: 0.45 mg/dL — ABNORMAL LOW (ref 0.50–1.10)
GFR calc Af Amer: >90 mL/min (ref >90)
GFR calc Af Amer: >90 mL/min (ref >90)
GFR calc non Af Amer: >90 mL/min (ref >90)
GFR calc non Af Amer: >90 mL/min (ref >90)
GLUCOSE: 100 mg/dL — AB (ref 70–99)
Glucose, Bld: 194 mg/dL — ABNORMAL HIGH (ref 70–99)
POTASSIUM: 3.9 mmol/L (ref 3.5–5.1)
Potassium: 6.4 mmol/L (ref 3.5–5.1)
Sodium: 144 mmol/L (ref 135–145)
Sodium: 147 mmol/L — ABNORMAL HIGH (ref 135–145)

## 2015-01-05 LAB — BLOOD GAS, ARTERIAL
ACID-BASE EXCESS: 12.4 mmol/L — AB (ref 0.0–2.0)
Acid-Base Excess: 9.3 mmol/L — ABNORMAL HIGH (ref 0.0–2.0)
BICARBONATE: 37.6 meq/L — AB (ref 20.0–24.0)
BICARBONATE: 37.9 meq/L — AB (ref 20.0–24.0)
Drawn by: 234301
FIO2: 100 %
FIO2: 100 %
O2 Saturation: 98.2 %
O2 Saturation: 99.4 %
PEEP/CPAP: 5 cmH2O
PH ART: 7.178 — AB (ref 7.350–7.450)
PH ART: 7.388 (ref 7.350–7.450)
Patient temperature: 37
RATE: 8 resp/min
TCO2: 35.5 mmol/L (ref 0–100)
TCO2: 33.4 mmol/L (ref 0–100)
VT: 500 mL
pCO2 arterial: 106 mmHg (ref 35.0–45.0)
pCO2 arterial: 64.3 mmHg (ref 35.0–45.0)
pO2, Arterial: 164 mmHg — ABNORMAL HIGH (ref 80.0–100.0)
pO2, Arterial: 217 mmHg — ABNORMAL HIGH (ref 80.0–100.0)

## 2015-01-05 LAB — PROTIME-INR
INR: 0.97 (ref 0.00–1.49)
Prothrombin Time: 13 seconds (ref 11.6–15.2)

## 2015-01-05 LAB — GLUCOSE, CAPILLARY
GLUCOSE-CAPILLARY: 124 mg/dL — AB (ref 70–99)
GLUCOSE-CAPILLARY: 95 mg/dL (ref 70–99)
GLUCOSE-CAPILLARY: 99 mg/dL (ref 70–99)
Glucose-Capillary: 97 mg/dL (ref 70–99)
Glucose-Capillary: 146 mg/dL — ABNORMAL HIGH (ref 70–99)
Glucose-Capillary: 193 mg/dL — ABNORMAL HIGH (ref 70–99)
Glucose-Capillary: 118 mg/dL — ABNORMAL HIGH (ref 70–99)
Glucose-Capillary: 178 mg/dL — ABNORMAL HIGH (ref 70–99)

## 2015-01-05 LAB — LACTIC ACID, PLASMA: Lactic Acid, Venous: 1.7 mmol/L (ref 0.5–2.2)

## 2015-01-05 LAB — APTT: APTT: 21 s — AB (ref 24–37)

## 2015-01-05 LAB — CBC
HCT: 50.8 % — ABNORMAL HIGH (ref 36.0–46.0)
HEMOGLOBIN: 14.3 g/dL (ref 12.0–15.0)
MCH: 27 pg (ref 26.0–34.0)
MCHC: 28.1 g/dL — ABNORMAL LOW (ref 30.0–36.0)
MCV: 95.8 fL (ref 78.0–100.0)
Platelets: 239 10*3/uL (ref 150–400)
RBC: 5.3 MIL/uL — ABNORMAL HIGH (ref 3.87–5.11)
RDW: 16.3 % — AB (ref 11.5–15.5)
WBC: 9.8 10*3/uL (ref 4.0–10.5)

## 2015-01-05 LAB — PHOSPHORUS: Phosphorus: 7.5 mg/dL — ABNORMAL HIGH (ref 2.3–4.6)

## 2015-01-05 LAB — MAGNESIUM: Magnesium: 2.6 mg/dL — ABNORMAL HIGH (ref 1.5–2.5)

## 2015-01-05 LAB — BRAIN NATRIURETIC PEPTIDE: B Natriuretic Peptide: 112 pg/mL — ABNORMAL HIGH (ref 0.0–100.0)

## 2015-01-05 LAB — TROPONIN I: Troponin I: <0.03 ng/mL (ref <0.031)

## 2015-01-05 MED ORDER — DEXTROSE 50 % IV SOLN
1.0000 | Freq: Once | INTRAVENOUS | Status: AC
  Administered 2015-01-05: 50 mL via INTRAVENOUS

## 2015-01-05 MED ORDER — PROPOFOL 10 MG/ML IV EMUL
5.0000 ug/kg/min | INTRAVENOUS | Status: DC
  Administered 2015-01-05: 5 ug/kg/min via INTRAVENOUS
  Administered 2015-01-06 – 2015-01-07 (×3): 10 ug/kg/min via INTRAVENOUS
  Filled 2015-01-05 (×4): qty 100

## 2015-01-05 MED ORDER — SODIUM POLYSTYRENE SULFONATE 15 GM/60ML PO SUSP
30.0000 g | Freq: Once | ORAL | Status: DC
  Filled 2015-01-05: qty 120

## 2015-01-05 MED ORDER — CHLORHEXIDINE GLUCONATE 0.12 % MT SOLN
15.0000 mL | Freq: Two times a day (BID) | OROMUCOSAL | Status: DC
  Administered 2015-01-05 – 2015-01-07 (×5): 15 mL via OROMUCOSAL
  Filled 2015-01-05 (×5): qty 15

## 2015-01-05 MED ORDER — CETYLPYRIDINIUM CHLORIDE 0.05 % MT LIQD
7.0000 mL | Freq: Four times a day (QID) | OROMUCOSAL | Status: DC
  Administered 2015-01-06 – 2015-01-07 (×9): 7 mL via OROMUCOSAL

## 2015-01-05 MED ORDER — SODIUM CHLORIDE 0.9 % IV SOLN
INTRAVENOUS | Status: DC
  Administered 2015-01-05 – 2015-01-10 (×6): via INTRAVENOUS

## 2015-01-05 MED ORDER — DEXTROSE 50 % IV SOLN
INTRAVENOUS | Status: AC
  Filled 2015-01-05: qty 50

## 2015-01-05 MED ORDER — FUROSEMIDE 10 MG/ML IJ SOLN
80.0000 mg | Freq: Once | INTRAMUSCULAR | Status: AC
  Administered 2015-01-05: 80 mg via INTRAVENOUS
  Filled 2015-01-05: qty 8

## 2015-01-05 MED ORDER — INSULIN ASPART 100 UNIT/ML ~~LOC~~ SOLN: 10.0000 [IU] | Freq: Once | SUBCUTANEOUS | Status: DC

## 2015-01-05 MED ORDER — SODIUM POLYSTYRENE SULFONATE 15 GM/60ML PO SUSP
30.0000 g | Freq: Once | ORAL | Status: AC
  Administered 2015-01-05: 30 g via RECTAL

## 2015-01-05 MED ORDER — INSULIN ASPART 100 UNIT/ML IV SOLN
10.0000 [IU] | Freq: Once | INTRAVENOUS | Status: AC
  Administered 2015-01-05: 10 [IU] via INTRAVENOUS

## 2015-01-05 NOTE — Progress Notes (Signed)
Subjective:  I was called to the patient who developed acute on chronic respiratory failure and intubated transferred to ICU. Patient became briefly unresponsive while on the floor and O2 dropped to 88%. EKG showed tachycardia. Patient her oxygen was increased and transferred to ICU. In ICU patient developed stopped breathing and code was called. ER physician camand intubated the patient. Currently patient is on Vent. Objective: Vital signs in last 24 hours: Temp:  [99.3 F (37.4 C)-100.2 F (37.9 C)] 100.2 F (37.9 C) (01/17 1353) Pulse Rate:  [74-118] 103 (01/17 1426) Resp:  [20-21] 20 (01/17 1426) BP: (110-130)/(50-86) 110/64 mmHg (01/17 1403) SpO2:  [88 %-97 %] 97 % (01/17 1426) FiO2 (%):  [100 %] 100 % (01/17 1447) Weight:  [85.2 kg (187 lb 13.3 oz)] 85.2 kg (187 lb 13.3 oz) (01/17 0528) Weight change:  Last BM Date: 01/04/15  Intake/Output from previous day: 01/16 0701 - 01/17 0700 In: 360 [P.O.:360] Out: 1450 [Urine:1450]  PHYSICAL EXAM General appearance: patient is sedated and unresponsvie to verbal communicatio Resp: diminished breath sounds bibasilar and transmitted sounds Cardio: regular rate and rhythm GI: soft, non-tender; bowel sounds normal; no masses,  no organomegaly Extremities: extremities normal, atraumatic, no cyanosis or edema  Lab Results:  Results for orders placed or performed during the hospital encounter of 12/24/14 (from the past 48 hour(s))  Glucose, capillary     Status: Abnormal   Collection Time: 01/03/15  5:14 PM  Result Value Ref Range   Glucose-Capillary 135 (H) 70 - 99 mg/dL   Comment 1 Notify RN    Comment 2 Documented in Chart   Glucose, capillary     Status: Abnormal   Collection Time: 01/03/15  8:26 PM  Result Value Ref Range   Glucose-Capillary 101 (H) 70 - 99 mg/dL   Comment 1 Documented in Chart    Comment 2 Notify RN   Glucose, capillary     Status: Abnormal   Collection Time: 01/04/15 12:31 AM  Result Value Ref Range   Glucose-Capillary 120 (H) 70 - 99 mg/dL   Comment 1 Documented in Chart    Comment 2 Notify RN   Glucose, capillary     Status: Abnormal   Collection Time: 01/04/15  8:12 AM  Result Value Ref Range   Glucose-Capillary 114 (H) 70 - 99 mg/dL   Comment 1 Documented in Chart    Comment 2 Notify RN   Glucose, capillary     Status: Abnormal   Collection Time: 01/04/15 12:08 PM  Result Value Ref Range   Glucose-Capillary 145 (H) 70 - 99 mg/dL   Comment 1 Documented in Chart    Comment 2 Notify RN   Glucose, capillary     Status: Abnormal   Collection Time: 01/04/15  4:40 PM  Result Value Ref Range   Glucose-Capillary 111 (H) 70 - 99 mg/dL   Comment 1 Documented in Chart    Comment 2 Notify RN   Glucose, capillary     Status: Abnormal   Collection Time: 01/04/15  8:23 PM  Result Value Ref Range   Glucose-Capillary 125 (H) 70 - 99 mg/dL   Comment 1 Documented in Chart    Comment 2 Notify RN   Glucose, capillary     Status: None   Collection Time: 01/05/15 12:11 AM  Result Value Ref Range   Glucose-Capillary 95 70 - 99 mg/dL   Comment 1 Notify RN    Comment 2 Documented in Chart   Glucose, capillary  Status: Abnormal   Collection Time: 01/05/15  4:12 AM  Result Value Ref Range   Glucose-Capillary 124 (H) 70 - 99 mg/dL   Comment 1 Documented in Chart    Comment 2 Notify RN   Glucose, capillary     Status: None   Collection Time: 01/05/15  7:24 AM  Result Value Ref Range   Glucose-Capillary 97 70 - 99 mg/dL   Comment 1 Documented in Chart    Comment 2 Notify RN   CBC with Differential     Status: Abnormal   Collection Time: 01/05/15 10:27 AM  Result Value Ref Range   WBC 6.7 4.0 - 10.5 K/uL   RBC 4.76 3.87 - 5.11 MIL/uL   Hemoglobin 12.6 12.0 - 15.0 g/dL   HCT 44.7 36.0 - 46.0 %   MCV 93.9 78.0 - 100.0 fL   MCH 26.5 26.0 - 34.0 pg   MCHC 28.2 (L) 30.0 - 36.0 g/dL   RDW 16.2 (H) 11.5 - 15.5 %   Platelets 175 150 - 400 K/uL   Neutrophils Relative % 84 (H) 43 - 77 %    Neutro Abs 5.7 1.7 - 7.7 K/uL   Lymphocytes Relative 12 12 - 46 %   Lymphs Abs 0.8 0.7 - 4.0 K/uL   Monocytes Relative 3 3 - 12 %   Monocytes Absolute 0.2 0.1 - 1.0 K/uL   Eosinophils Relative 1 0 - 5 %   Eosinophils Absolute 0.1 0.0 - 0.7 K/uL   Basophils Relative 0 0 - 1 %   Basophils Absolute 0.0 0.0 - 0.1 K/uL  Comprehensive metabolic panel     Status: Abnormal   Collection Time: 01/05/15 10:27 AM  Result Value Ref Range   Sodium 143 135 - 145 mmol/L    Comment: Please note change in reference range.   Potassium 4.1 3.5 - 5.1 mmol/L    Comment: Please note change in reference range.   Chloride 99 96 - 112 mEq/L   CO2 36 (H) 19 - 32 mmol/L   Glucose, Bld 165 (H) 70 - 99 mg/dL   BUN 23 6 - 23 mg/dL   Creatinine, Ser 0.39 (L) 0.50 - 1.10 mg/dL   Calcium 9.1 8.4 - 10.5 mg/dL   Total Protein 6.3 6.0 - 8.3 g/dL   Albumin 3.8 3.5 - 5.2 g/dL   AST 16 0 - 37 U/L   ALT 17 0 - 35 U/L   Alkaline Phosphatase 45 39 - 117 U/L   Total Bilirubin 0.9 0.3 - 1.2 mg/dL   GFR calc non Af Amer >90 >90 mL/min   GFR calc Af Amer >90 >90 mL/min    Comment: (NOTE) The eGFR has been calculated using the CKD EPI equation. This calculation has not been validated in all clinical situations. eGFR's persistently <90 mL/min signify possible Chronic Kidney Disease.    Anion gap 8 5 - 15  Glucose, capillary     Status: Abnormal   Collection Time: 01/05/15 11:36 AM  Result Value Ref Range   Glucose-Capillary 146 (H) 70 - 99 mg/dL   Comment 1 Documented in Chart    Comment 2 Notify RN   Blood gas, arterial     Status: Abnormal   Collection Time: 01/05/15  2:08 PM  Result Value Ref Range   FIO2 100.00 %   Delivery systems OXYGEN MASK    pH, Arterial 7.178 (LL) 7.350 - 7.450    Comment: CRITICAL RESULT CALLED TO, READ BACK BY AND VERIFIED WITH:  MISTY MCDANIELS,RN AT 1415 BY VANESSA LAWSON,RRT,RCP ON 01/06/2016    pCO2 arterial 106.0 (HH) 35.0 - 45.0 mmHg    Comment: CRITICAL RESULT CALLED TO, READ  BACK BY AND VERIFIED WITH: MISTY MCDANIELS,RN AT 1415 BY VANESSA LAWSON,RRT,RCP ON 01/05/2015    pO2, Arterial 164.0 (H) 80.0 - 100.0 mmHg   Bicarbonate 37.6 (H) 20.0 - 24.0 mEq/L   TCO2 35.5 0 - 100 mmol/L   Acid-Base Excess 9.3 (H) 0.0 - 2.0 mmol/L   O2 Saturation 98.2 %   Patient temperature 37.0    Collection site LEFT RADIAL    Drawn by COLLECTED BY RT    Sample type ARTERIAL    Allens test (pass/fail) PASS PASS  Protime-INR     Status: None   Collection Time: 01/05/15  2:45 PM  Result Value Ref Range   Prothrombin Time 13.0 11.6 - 15.2 seconds   INR 0.97 0.00 - 1.49  CBC     Status: Abnormal   Collection Time: 01/05/15  2:45 PM  Result Value Ref Range   WBC 9.8 4.0 - 10.5 K/uL   RBC 5.30 (H) 3.87 - 5.11 MIL/uL   Hemoglobin 14.3 12.0 - 15.0 g/dL   HCT 50.8 (H) 36.0 - 46.0 %   MCV 95.8 78.0 - 100.0 fL   MCH 27.0 26.0 - 34.0 pg   MCHC 28.1 (L) 30.0 - 36.0 g/dL   RDW 16.3 (H) 11.5 - 15.5 %   Platelets 239 150 - 400 K/uL    Comment: DELTA CHECK NOTED  APTT     Status: Abnormal   Collection Time: 01/05/15  2:45 PM  Result Value Ref Range   aPTT 21 (L) 24 - 37 seconds    ABGS  Recent Labs  01/05/15 1408  PHART 7.178*  PO2ART 164.0*  TCO2 35.5  HCO3 37.6*   CULTURES No results found for this or any previous visit (from the past 240 hour(s)). Studies/Results: Dg Chest Portable 1 View  01/05/2015   CLINICAL DATA:  Intubation, history asthma, cerebral palsy, hypertension, MI  EXAM: PORTABLE CHEST - 1 VIEW  COMPARISON:  Portable exam 1448 hr compared to 12/31/2014  FINDINGS: Tip of endotracheal tube projects 2.6 cm above carina.  Rotated to the LEFT.  Enlargement of cardiac silhouette with vascular congestion.  Bibasilar atelectasis, cannot exclude consolidation in LEFT lower lobe.  Upper lungs clear.  Tip of RIGHT arm PICC line projects over SVC.  IMPRESSION: Satisfactory endotracheal tube position.  Bibasilar atelectasis, cannot exclude consolidation in LEFT lower lobe.    Electronically Signed   By: Lavonia Dana M.D.   On: 01/05/2015 15:06    Medications: I have reviewed the patient's current medications.  Assesment:   Active Problems:   Morbid obesity   Cerebral palsy   Hypertension   Sepsis   Acute and chronic respiratory failure with hypercapnia   Encephalopathy, metabolic   UTI (urinary tract infection) Ventilatory dependent respiratory failure R/O Sepsis R/O stroke   Plan: Will continue ventilatory support Labs-sepsis panel CT SCan of the had Will start on kaxylate suppository Repeat BMP Discussed patient's condition with her son    LOS: 12 days   Vadis Slabach 01/05/2015, 3:23 PM

## 2015-01-05 NOTE — Consult Note (Signed)
[facility]  Department of Emergency Medicine   Code Blue CONSULT NOTE  Chief Complaint: Cardiac arrest/unresponsive   Level V Caveat: Unresponsive  History of present illness: I was contacted by the hospital for a CODE BLUE cardiac arrest upstairs and presented to the patient's bedside.   Called to bedside for respiratory arrest. Patient admitted 10 days ago for UTI and sepsis. Was doing well until this morning she became less responsive and was placed on BiPAP. CO2 retention on ABG.  Sinus tachycardia on arrival, patient is not responsive. No spontaneous movement.  ROS: Unable to obtain, Level V caveat  Scheduled Meds: . albuterol  2.5 mg Nebulization QID  . enoxaparin (LOVENOX) injection  40 mg Subcutaneous Q24H  . feeding supplement (GLUCERNA SHAKE)  237 mL Oral BID BM  . furosemide  40 mg Intravenous Daily  . haloperidol  0.5 mg Oral BID  . insulin aspart  0-20 Units Subcutaneous 6 times per day  . pantoprazole  40 mg Oral Daily  . potassium chloride  40 mEq Oral BID  . predniSONE  40 mg Oral Q breakfast  . sodium chloride  10-40 mL Intracatheter Q12H   Continuous Infusions:  PRN Meds:.sodium chloride, acetaminophen, albuterol, ALPRAZolam, fentaNYL, ondansetron (ZOFRAN) IV, sodium chloride Past Medical History  Diagnosis Date  . Cerebral palsy   . Asthma   . Seasonal allergies   . Thyroid disease   . HTN (hypertension)   . Anxiety   . Arthritis   . Breast cancer     Right breast, infiltrating ductal.  . Anginal pain   . Osteoporosis 05/08/2012  . Osteoporosis 05/08/2012  . Stroke   . Angina at rest   . Chronic steroid use   . Pneumonia 09/2013  . Dysphagia   . Hypopotassemia   . Respiratory failure   . Generalized weakness   . Urinary retention   . Dysphagia   . Acute MI   . Hypopotassemia   . Sepsis   . Neurogenic bladder   . Reflux   . Obesity    Past Surgical History  Procedure Laterality Date  . Breast lumpectomy    . Radical  abdominal hysterectomy    . Dental extraction    . Right hand surgery     History   Social History  . Marital Status: Widowed    Spouse Name: N/A    Number of Children: N/A  . Years of Education: college   Occupational History  . disabled    Social History Main Topics  . Smoking status: Never Smoker   . Smokeless tobacco: Never Used  . Alcohol Use: No  . Drug Use: No  . Sexual Activity: No   Other Topics Concern  . Not on file   Social History Narrative   Allergies  Allergen Reactions  . Latex     Provided via MAR  . Naproxen Other (See Comments)    Unknown-Provided via MAR    Last set of Vital Signs (not current) Filed Vitals:   01/05/15 1426  BP:   Pulse: 103  Temp:   Resp: 20      Physical Exam  Gen: unresponsive Cardiovascular: tachycardic Resp: apneic. Breath sounds equal but decreased bilaterally with bagging  Abd: nondistended  Neuro: GCS 3, unresponsive to pain  HEENT: No blood in posterior pharynx, gag reflex absent  Neck: No crepitus  Musculoskeletal: No deformity  Skin: warm  Procedures  INTUBATION Performed by: Ezequiel Essex Required items: required blood products,  implants, devices, and special equipment available Patient identity confirmed: provided demographic data and hospital-assigned identification number Time out: Immediately prior to procedure a "time out" was called to verify the correct patient, procedure, equipment, support staff and site/side marked as required. Indications: respiratory arrest Intubation method: video Preoxygenation: BVM Sedatives: etomidate Paralytic: succinylcholine Tube Size: 7.5 cuffed Post-procedure assessment: chest rise and ETCO2 monitor Breath sounds: equal and absent over the epigastrium Tube secured by Respiratory Therapy Patient tolerated the procedure well with no immediate complications.  CRITICAL CARE Performed by: Ezequiel Essex Total critical care time: 40 Critical care time was  exclusive of separately billable procedures and treating other patients. Critical care was necessary to treat or prevent imminent or life-threatening deterioration. Critical care was time spent personally by me on the following activities: development of treatment plan with patient and/or surrogate as well as nursing, discussions with consultants, evaluation of patient's response to treatment, examination of patient, obtaining history from patient or surrogate, ordering and performing treatments and interventions, ordering and review of laboratory studies, ordering and review of radiographic studies, pulse oximetry and re-evaluation of patient's condition.   Medical Decision making  Respiratory arrest from likely CO2 narcosis, possible CHF.  Assessment and Plan  Patient intubated without incident. Chest x-ray shows adequate ET tube placement. EKG sinus tachycardia without acute ST changes. Interstitial edema on chest x-ray. IV Lasix dose given.  Blood pressure 056 systolic. Heart rate 100. Discussed with Dr. Legrand Rams who assumes care of patient

## 2015-01-05 NOTE — Progress Notes (Signed)
Subjective: After I saw her yesterday she developed rectal temperature of 100.3 and became clammy. She was kept in the hospital and discharge was canceled. She is still having some low-grade fever.  Objective: Vital signs in last 24 hours: Temp:  [99.3 F (37.4 C)-100.3 F (37.9 C)] 99.3 F (37.4 C) (01/17 0528) Pulse Rate:  [74-112] 75 (01/17 0528) Resp:  [20-21] 21 (01/17 0528) BP: (102-130)/(50-86) 120/50 mmHg (01/17 0528) SpO2:  [90 %-97 %] 96 % (01/17 0753) Weight:  [85.2 kg (187 lb 13.3 oz)] 85.2 kg (187 lb 13.3 oz) (01/17 0528) Weight change:  Last BM Date: 01/04/15  Intake/Output from previous day: 01/16 0701 - 01/17 0700 In: 360 [P.O.:360] Out: 1450 [Urine:1450]  PHYSICAL EXAM General appearance: alert, cooperative and Confused Resp: clear to auscultation bilaterally Cardio: regular rate and rhythm, S1, S2 normal, no murmur, click, rub or gallop GI: soft, non-tender; bowel sounds normal; no masses,  no organomegaly Extremities: extremities normal, atraumatic, no cyanosis or edema  Lab Results:  Results for orders placed or performed during the hospital encounter of 12/24/14 (from the past 48 hour(s))  Glucose, capillary     Status: Abnormal   Collection Time: 01/03/15 11:15 AM  Result Value Ref Range   Glucose-Capillary 134 (H) 70 - 99 mg/dL  Glucose, capillary     Status: Abnormal   Collection Time: 01/03/15  5:14 PM  Result Value Ref Range   Glucose-Capillary 135 (H) 70 - 99 mg/dL   Comment 1 Notify RN    Comment 2 Documented in Chart   Glucose, capillary     Status: Abnormal   Collection Time: 01/03/15  8:26 PM  Result Value Ref Range   Glucose-Capillary 101 (H) 70 - 99 mg/dL   Comment 1 Documented in Chart    Comment 2 Notify RN   Glucose, capillary     Status: Abnormal   Collection Time: 01/04/15 12:31 AM  Result Value Ref Range   Glucose-Capillary 120 (H) 70 - 99 mg/dL   Comment 1 Documented in Chart    Comment 2 Notify RN   Glucose, capillary      Status: Abnormal   Collection Time: 01/04/15  8:12 AM  Result Value Ref Range   Glucose-Capillary 114 (H) 70 - 99 mg/dL   Comment 1 Documented in Chart    Comment 2 Notify RN   Glucose, capillary     Status: Abnormal   Collection Time: 01/04/15 12:08 PM  Result Value Ref Range   Glucose-Capillary 145 (H) 70 - 99 mg/dL   Comment 1 Documented in Chart    Comment 2 Notify RN   Glucose, capillary     Status: Abnormal   Collection Time: 01/04/15  4:40 PM  Result Value Ref Range   Glucose-Capillary 111 (H) 70 - 99 mg/dL   Comment 1 Documented in Chart    Comment 2 Notify RN   Glucose, capillary     Status: Abnormal   Collection Time: 01/04/15  8:23 PM  Result Value Ref Range   Glucose-Capillary 125 (H) 70 - 99 mg/dL   Comment 1 Documented in Chart    Comment 2 Notify RN   Glucose, capillary     Status: None   Collection Time: 01/05/15 12:11 AM  Result Value Ref Range   Glucose-Capillary 95 70 - 99 mg/dL   Comment 1 Notify RN    Comment 2 Documented in Chart   Glucose, capillary     Status: Abnormal   Collection Time: 01/05/15  4:12 AM  Result Value Ref Range   Glucose-Capillary 124 (H) 70 - 99 mg/dL   Comment 1 Documented in Chart    Comment 2 Notify RN   Glucose, capillary     Status: None   Collection Time: 01/05/15  7:24 AM  Result Value Ref Range   Glucose-Capillary 97 70 - 99 mg/dL   Comment 1 Documented in Chart    Comment 2 Notify RN     ABGS No results for input(s): PHART, PO2ART, TCO2, HCO3 in the last 72 hours.  Invalid input(s): PCO2 CULTURES No results found for this or any previous visit (from the past 240 hour(s)). Studies/Results: No results found.  Medications:  Prior to Admission:  Prescriptions prior to admission  Medication Sig Dispense Refill Last Dose  . acetaminophen (TYLENOL) 325 MG tablet Take 2 tablets (650 mg total) by mouth every 6 (six) hours as needed for mild pain (or Fever >/= 101).   unknown  . albuterol (PROVENTIL HFA;VENTOLIN HFA)  108 (90 BASE) MCG/ACT inhaler Inhale 2 puffs into the lungs every 4 (four) hours as needed. Shortness of breath 1 Inhaler 12 unknown  . albuterol (PROVENTIL) (2.5 MG/3ML) 0.083% nebulizer solution Take 2.5 mg by nebulization 4 (four) times daily.   12/24/2014 at 1200  . ALPRAZolam (XANAX) 1 MG tablet Take 1 mg by mouth 4 (four) times daily.    12/24/2014 at 1400  . Amino Acids-Protein Hydrolys (FEEDING SUPPLEMENT, PRO-STAT SUGAR FREE 64,) LIQD Take 30 mLs by mouth 2 (two) times daily with a meal.   12/24/2014 at 800a  . aspirin 325 MG tablet Take 1 tablet (325 mg total) by mouth daily. 30 tablet 12 12/24/2014 at [place]  . atorvastatin (LIPITOR) 40 MG tablet Take 40 mg by mouth daily.   12/24/2014 at [place]  . baclofen (LIORESAL) 10 MG tablet Take 5 mg by mouth 3 (three) times daily.   12/24/2014 at 1400  . cefTRIAXone (ROCEPHIN) 1 G injection Inject 1 g into the muscle once. Last administered on 12/23/2014 at 22:00 IM to right hip   12/23/2014  . cholecalciferol (VITAMIN D) 1000 UNITS tablet Take 1,000 Units by mouth daily.   12/24/2014 at [place]  . clopidogrel (PLAVIX) 75 MG tablet Take 1 tablet (75 mg total) by mouth daily with breakfast.   12/24/2014 at [place]  . Cranberry 450 MG CAPS Take 1 capsule by mouth daily.   12/24/2014 at 900a  . guaiFENesin (MUCINEX) 600 MG 12 hr tablet Take 600 mg by mouth 2 (two) times daily.   12/24/2014 at 800a  . haloperidol (HALDOL) 0.5 MG tablet Take 0.5 mg by mouth 2 (two) times daily.    12/24/2014 at 900a  . levothyroxine (SYNTHROID, LEVOTHROID) 88 MCG tablet Take 1 tablet (88 mcg total) by mouth daily before breakfast. 30 tablet 12 12/24/2014 at 600a  . loperamide (IMODIUM) 2 MG capsule Take 1 capsule (2 mg total) by mouth as needed for diarrhea or loose stools. 30 capsule 0 unknown  . loratadine (CLARITIN) 10 MG tablet Take 10 mg by mouth daily.   12/24/2014 at [place]  . nitroGLYCERIN (NITROSTAT) 0.4 MG SL tablet Place 1 tablet (0.4 mg total) under the tongue every 5 (five) minutes as needed for  chest pain. 25 tablet 12 unknown  . pantoprazole (PROTONIX) 40 MG tablet Take 1 tablet (40 mg total) by mouth daily. 30 tablet 12 12/24/2014 at 600a  . polyethylene glycol (MIRALAX / GLYCOLAX) packet Take 17 g by mouth daily.   12/24/2014  at [place]  . potassium chloride SA (K-DUR,KLOR-CON) 20 MEQ tablet Take 10 mEq by mouth daily.    12/24/2014 at [place]  . predniSONE (DELTASONE) 5 MG tablet Take 5 mg by mouth daily with breakfast.   12/24/2014 at [place]  . ranolazine (RANEXA) 500 MG 12 hr tablet Take 1 tablet (500 mg total) by mouth 2 (two) times daily. 60 tablet 3 12/24/2014 at [place]  . saccharomyces boulardii (FLORASTOR) 250 MG capsule Take 250 mg by mouth 2 (two) times daily.   12/24/2014 at 800a  . sertraline (ZOLOFT) 100 MG tablet Take 1 tablet (100 mg total) by mouth daily.   12/24/2014 at 900a  . temazepam (RESTORIL) 7.5 MG capsule Take 7.5 mg by mouth at bedtime as needed for sleep.   unknown  . traMADol (ULTRAM) 50 MG tablet Take 50 mg by mouth every 6 (six) hours as needed for moderate pain.   unknown  . trolamine salicylate (ASPERCREME) 10 % cream Apply topically as needed for muscle pain. (Patient not taking: Reported on 12/24/2014) 85 g 0 Taking  . [DISCONTINUED] furosemide (LASIX) 20 MG tablet Take 20 mg by mouth daily.   Taking   Scheduled: . albuterol  2.5 mg Nebulization QID  . enoxaparin (LOVENOX) injection  40 mg Subcutaneous Q24H  . feeding supplement (GLUCERNA SHAKE)  237 mL Oral BID BM  . furosemide  40 mg Intravenous Daily  . haloperidol  0.5 mg Oral BID  . insulin aspart  0-20 Units Subcutaneous 6 times per day  . pantoprazole  40 mg Oral Daily  . potassium chloride  40 mEq Oral BID  . predniSONE  40 mg Oral Q breakfast  . sodium chloride  10-40 mL Intracatheter Q12H   Continuous:  GOT:LXBWIO chloride, acetaminophen, albuterol, ALPRAZolam, fentaNYL, ondansetron (ZOFRAN) IV, sodium chloride  Assesment: She was admitted with acute on chronic respiratory failure requiring ventilator  support. She had significant metabolic encephalopathy. She was septic and a definite source of infection was not found. She has low-grade fever now and although she's being set up to go back to a nursing home I'm concerned that if we send her to the nursing home as she is she is likely to be a very early return. Active Problems:   Morbid obesity   Cerebral palsy   Hypertension   Sepsis   Acute and chronic respiratory failure with hypercapnia   Encephalopathy, metabolic   UTI (urinary tract infection)    Plan: She will have laboratory work including blood cultures and urine culture and continue other treatments    LOS: 12 days   Darianny Momon L 01/05/2015, 10:03 AM

## 2015-01-05 NOTE — Progress Notes (Signed)
Pt noted to be unresponsive by staff on 300 around 1400; RN went to assess pt who was unresponsive; O2 was 88% on 6L [place]. Placed pt on NRB and sats came up to mid 90's. EKG was done as well as ABG. MD notified. Orders received and all carried out except stat head CT as pt was too unstable. Pt transferred to ICU and placed on bipap and soon became apneic. Pt was intubated at this time. MD came to see pt at bedside and said to take to CT when pt more stable.  Orders also received for oral kayexalate. Called MD to get orders for OG tube placement. Placed OG tube. 2 RN's verified placement by auscultation and gastric secretions were able to be aspirated; however CXR could not verify placement so MD had to be notified to change order route from oral route to rectal. Orders received and carried out. Continuing to monitor.

## 2015-01-05 NOTE — Progress Notes (Signed)
Assisted patient to ICU, report given to [facility], RN.  Telemetry returned to 59F.

## 2015-01-05 NOTE — Progress Notes (Signed)
Staff called to patient's room, patient had become unresponsive while being cleaned up. Rapid response called,02 sat was 88% and patient continued to be difficult to respond. EKG performed, oxygen increased and MD called. Orders given for ABG, head CT, bipap and transfer to ICU. Report given to [facility] and patient transferred.

## 2015-01-06 LAB — URINE CULTURE
Colony Count: >=100000
SPECIAL REQUESTS: NORMAL

## 2015-01-06 LAB — BLOOD GAS, ARTERIAL
Acid-Base Excess: 13.3 mmol/L — ABNORMAL HIGH (ref 0.0–2.0)
Bicarbonate: 38.2 mEq/L — ABNORMAL HIGH (ref 20.0–24.0)
Drawn by: 10555
FIO2: 0.4 %
MECHVT: 500 mL
O2 Saturation: 96.1 %
PATIENT TEMPERATURE: 37
PEEP: 5 cmH2O
RATE: 8 resp/min
TCO2: 33.9 mmol/L (ref 0–100)
pCO2 arterial: 55.7 mmHg — ABNORMAL HIGH (ref 35.0–45.0)
pH, Arterial: 7.451 — ABNORMAL HIGH (ref 7.350–7.450)
pO2, Arterial: 84.8 mmHg (ref 80.0–100.0)

## 2015-01-06 LAB — GLUCOSE, CAPILLARY
GLUCOSE-CAPILLARY: 98 mg/dL (ref 70–99)
GLUCOSE-CAPILLARY: 113 mg/dL — AB (ref 70–99)
Glucose-Capillary: 129 mg/dL — ABNORMAL HIGH (ref 70–99)
Glucose-Capillary: 118 mg/dL — ABNORMAL HIGH (ref 70–99)
Glucose-Capillary: 125 mg/dL — ABNORMAL HIGH (ref 70–99)

## 2015-01-06 MED ORDER — VANCOMYCIN HCL 10 G IV SOLR
1250.0000 mg | INTRAVENOUS | Status: DC
  Administered 2015-01-07 – 2015-01-09 (×3): 1250 mg via INTRAVENOUS
  Filled 2015-01-06 (×3): qty 1250

## 2015-01-06 MED ORDER — VANCOMYCIN HCL 10 G IV SOLR
1500.0000 mg | Freq: Once | INTRAVENOUS | Status: AC
  Administered 2015-01-06: 1500 mg via INTRAVENOUS
  Filled 2015-01-06: qty 1500

## 2015-01-06 MED ORDER — METHYLPREDNISOLONE SODIUM SUCC 40 MG IJ SOLR
40.0000 mg | Freq: Four times a day (QID) | INTRAMUSCULAR | Status: DC
  Administered 2015-01-06 – 2015-01-10 (×18): 40 mg via INTRAVENOUS
  Filled 2015-01-06 (×17): qty 1

## 2015-01-06 MED ORDER — HALOPERIDOL LACTATE 5 MG/ML IJ SOLN
1.0000 mg | Freq: Two times a day (BID) | INTRAMUSCULAR | Status: DC
  Administered 2015-01-06 – 2015-01-10 (×9): 1 mg via INTRAVENOUS
  Filled 2015-01-06 (×10): qty 1

## 2015-01-06 MED ORDER — LORAZEPAM 2 MG/ML IJ SOLN
1.0000 mg | INTRAMUSCULAR | Status: DC | PRN
Start: 2015-01-06 — End: 2015-01-10
  Administered 2015-01-06 – 2015-01-09 (×4): 1 mg via INTRAVENOUS
  Filled 2015-01-06 (×4): qty 1

## 2015-01-06 MED ORDER — PIPERACILLIN-TAZOBACTAM 3.375 G IVPB
3.3750 g | Freq: Three times a day (TID) | INTRAVENOUS | Status: DC
Start: 2015-01-06 — End: 2015-01-09
  Administered 2015-01-06 – 2015-01-09 (×10): 3.375 g via INTRAVENOUS
  Filled 2015-01-06 (×10): qty 50

## 2015-01-06 MED ORDER — PANTOPRAZOLE SODIUM 40 MG IV SOLR
40.0000 mg | INTRAVENOUS | Status: DC
  Administered 2015-01-06 – 2015-01-10 (×5): 40 mg via INTRAVENOUS
  Filled 2015-01-06 (×5): qty 40

## 2015-01-06 NOTE — Progress Notes (Signed)
SLP Cancellation Note  Patient Details Name: [name] MRN: [id number] DOB: [birth year]   Cancelled treatment:       Reason Eval/Treat Not Completed: Medical issues which prohibited therapy; Pt transferred to higher level of care and required intubation in ICU. Reconsult SLP as needs arise.  Thank you,  Genene Churn, [place]    Craig 01/06/2015, 4:36 PM

## 2015-01-06 NOTE — Care Management Utilization Note (Signed)
UR completed 

## 2015-01-06 NOTE — Progress Notes (Signed)
INITIAL NUTRITION ASSESSMENT  DOCUMENTATION CODES Per approved criteria  -Severe Malnutrition in the context of acute illness- Obesity Unspecified   Pt meets criteria for severe MALNUTRITION in the context of acute illness as evidenced by </=50% energy intake for >/= 5 days and >2% wt loss in 1 week.   INTERVENTION: Initiate Vital High Protein@ 20 ml/hr via OGT and increase by 10 ml every 4 hours to goal rate of 45 ml/hr.   Tube feeding regimen provides 1080 kcal , 94 grams of protein, and 903 ml of H2O.    NUTRITION DIAGNOSIS: Inadequate oral intake related to decline in respiratory status pt re-intubated  as evidenced by NPO.   Goal: Enteral nutrition to provide 60-70% of estimated calorie needs (22-25 kcals/kg ideal body weight) and 100% of estimated protein needs, based on ASPEN guidelines for hypocaloric, high protein feeding in critically ill obese individuals  Monitor:  Nutrition support measures, respiratory status, weights and labs  Reason for Assessment: change in status-(pt re-intubated)  74 y.o. female   ASSESSMENT: Patient re-intubated on ventilator support 1/17 @ 1445. Pt has UTI, encephalopathy and is septic. Her weight has decreased 13% in 12 days. MV: 4.1 L/min Pneumonia and Cerebral palsy. Temp (24hrs), Avg:98.7 F (37.1 C), Min:97.6 F (36.4 C), Max:100.3 F (37.9 C)  Propofol: 5.1 ml/hr   Labs: phosphorus 7.5 high, mag 2.6  Height: Ht Readings from Last 1 Encounters:  01/05/15 5\' 6"  (1.676 m)    Weight: Wt Readings from Last 1 Encounters:  01/06/15 187 lb 6.3 oz (85 kg)  12/25/14 admit weight 216.8#   Ideal Body Weight: 130# (59 kg)  % Ideal Body Weight: 144%  Wt Readings from Last 10 Encounters:  01/06/15 187 lb 6.3 oz (85 kg)  11/12/14 224 lb (101.606 kg)  06/15/14 224 lb 6.9 oz (101.8 kg)  05/29/14 220 lb (99.791 kg)  03/01/14 214 lb (97.07 kg)  01/22/14 204 lb (92.534 kg)  01/02/14 221 lb 1.9 oz (100.3 kg)  08/09/13 224 lb 13.9 oz  (102 kg)  08/06/13 235 lb (106.595 kg)  06/13/13 234 lb 11.2 oz (106.459 kg)    Usual Body Weight: 215-225#  % Usual Body Weight: 83%  BMI:  Body mass index is 30.26 kg/(m^2). obesity class I  Estimated Nutritional Needs: Kcal: 1301 Underfeeding goal:1032 kcal Protein: 106-118 gr Fluid: >1300 ml daily  Skin: no issues noted  Diet Order: Diet NPO time specified  EDUCATION NEEDS: -No education needs identified at this time   Intake/Output Summary (Last 24 hours) at 01/06/15 1641 Last data filed at 01/06/15 1458  Gross per 24 hour  Intake 1462.75 ml  Output   2975 ml  Net -1512.25 ml    Last BM: 01/06/15 soft, medium stool  Labs:   Recent Labs Lab 01/05/15 1027 01/05/15 1445 01/05/15 2012  NA 143 144 147*  K 4.1 6.4* 3.9  CL 99 100 101  CO2 36* 37* 40*  BUN 23 26* 23  CREATININE 0.39* 0.45* 0.39*  CALCIUM 9.1 8.6 8.4  MG  --  2.6*  --   PHOS  --  7.5*  --   GLUCOSE 165* 194* 100*    CBG (last 3)   Recent Labs  01/06/15 0517 01/06/15 0808 01/06/15 1151  GLUCAP 113* 129* 118*    Scheduled Meds: . albuterol  2.5 mg Nebulization QID  . antiseptic oral rinse  7 mL Mouth Rinse QID  . chlorhexidine  15 mL Mouth Rinse BID  . enoxaparin (LOVENOX) injection  40 mg Subcutaneous Q24H  . furosemide  40 mg Intravenous Daily  . haloperidol lactate  1 mg Intravenous Q12H  . insulin aspart  0-20 Units Subcutaneous 6 times per day  . methylPREDNISolone (SOLU-MEDROL) injection  40 mg Intravenous Q6H  . pantoprazole (PROTONIX) IV  40 mg Intravenous Q24H  . piperacillin-tazobactam (ZOSYN)  IV  3.375 g Intravenous Q8H  . sodium chloride  10-40 mL Intracatheter Q12H  . [START ON 01/07/2015] vancomycin  1,250 mg Intravenous Q24H    Continuous Infusions: . sodium chloride 100 mL/hr at 01/06/15 1500  . propofol 10 mcg/kg/min (01/06/15 1550)    Past Medical History  Diagnosis Date  . Cerebral palsy   . Asthma   . Seasonal allergies   . Thyroid disease   .  HTN (hypertension)   . Anxiety   . Arthritis   . Breast cancer     Right breast, infiltrating ductal.  . Anginal pain   . Osteoporosis 05/08/2012  . Osteoporosis 05/08/2012  . Stroke   . Angina at rest   . Chronic steroid use   . Pneumonia 09/2013  . Dysphagia   . Hypopotassemia   . Respiratory failure   . Generalized weakness   . Urinary retention   . Dysphagia   . Acute MI   . Hypopotassemia   . Sepsis   . Neurogenic bladder   . Reflux   . Obesity     Past Surgical History  Procedure Laterality Date  . Breast lumpectomy    . Radical abdominal hysterectomy    . Dental extraction    . Right hand surgery      Colman Cater MS,RD,CSG,LDN Office: #[phone] Pager: [phone]

## 2015-01-06 NOTE — Progress Notes (Signed)
CRITICAL VALUE ALERT  Critical value received:  Blood cultures   Date of notification:  01/06/15  Time of notification:  0625  Critical value read back: Yes  Nurse who received alert:  Gurney Maxin, RN  MD notified (1st page):  Sticky note on chart  Time of first page: .  MD notified (2nd page):Marland Kitchen  Time of second page:.  Responding MD: .  Time MD responded:  .

## 2015-01-06 NOTE — Progress Notes (Signed)
[place] for Vancomycin & Zosyn Indication: urosepsis  Allergies  Allergen Reactions  . Latex     Provided via MAR  . Naproxen Other (See Comments)    Unknown-Provided via MAR   Patient Measurements: Height: 5\' 6"  (167.6 cm) Weight: 187 lb 6.3 oz (85 kg) IBW/kg (Calculated) : 59.3  Vital Signs: Temp: 97.6 F (36.4 C) (01/18 0400) Temp Source: Axillary (01/18 0400) BP: 85/55 mmHg (01/18 0630) Pulse Rate: 77 (01/18 0630) Intake/Output from previous day: 01/17 0701 - 01/18 0700 In: 1819.8 [P.O.:357; I.V.:1462.8] Out: 1875 [BJYNW:2956] Intake/Output from this shift:    Labs:  Recent Labs  01/05/15 1027 01/05/15 1445 01/05/15 2012  WBC 6.7 9.8  --   HGB 12.6 14.3  --   PLT 175 239  --   CREATININE 0.39* 0.45* 0.39*   Estimated Creatinine Clearance: 68.8 mL/min (by C-G formula based on Cr of 0.39). No results for input(s): VANCOTROUGH, VANCOPEAK, VANCORANDOM, GENTTROUGH, GENTPEAK, GENTRANDOM, TOBRATROUGH, TOBRAPEAK, TOBRARND, AMIKACINPEAK, AMIKACINTROU, AMIKACIN in the last 72 hours.   Microbiology: Recent Results (from the past 720 hour(s))  Culture, blood (routine x 2)     Status: None   Collection Time: 12/24/14  3:30 PM  Result Value Ref Range Status   Specimen Description BLOOD RIGHT WRIST DRAWN BY RN [phone]  Final   Special Requests BOTTLES DRAWN AEROBIC AND ANAEROBIC 6CC  Final   Culture NO GROWTH 6 DAYS  Final   Report Status 12/30/2014 FINAL  Final  Culture, blood (routine x 2)     Status: None   Collection Time: 12/24/14  3:30 PM  Result Value Ref Range Status   Specimen Description BLOOD LEFT HAND DRAWN BY RN [phone]  Final   Special Requests BOTTLES DRAWN AEROBIC AND ANAEROBIC 6CC   Final   Culture   Final    STAPHYLOCOCCUS SPECIES (COAGULASE NEGATIVE) Note: THE SIGNIFICANCE OF ISOLATING THIS ORGANISM FROM A SINGLE VENIPUNCTURE CANNOT BE PREDICTED WITHOUT FURTHER CLINICAL AND CULTURE CORRELATION. SUSCEPTIBILITIES  AVAILABLE ONLY ON REQUEST. Note: Gram Stain Report Called to,Read Back By and Verified With: PHILLIPS C. AT 1610 ON 12/25/14 BY BAUGHAM M. Performed at [facility] Performed at [facility]    Report Status 12/27/2014 FINAL  Final  Urine culture     Status: None   Collection Time: 12/24/14  3:40 PM  Result Value Ref Range Status   Specimen Description URINE, CATHETERIZED  Final   Special Requests NONE  Final   Colony Count NO GROWTH Performed at [facility]   Final   Culture NO GROWTH Performed at [facility]   Final   Report Status 12/25/2014 FINAL  Final  MRSA PCR Screening     Status: Abnormal   Collection Time: 12/24/14  9:20 PM  Result Value Ref Range Status   MRSA by PCR POSITIVE (A) NEGATIVE Final    Comment:        The GeneXpert MRSA Assay (FDA approved for NASAL specimens only), is one component of a comprehensive MRSA colonization surveillance program. It is not intended to diagnose MRSA infection nor to guide [place] [address] BY FORSYTH K   Culture, blood (routine x 2)     Status: None (Preliminary result)   Collection Time: 01/05/15 10:16 AM  Result Value Ref Range Status   Specimen Description LEFT ANTECUBITAL  Final   Special Requests BOTTLES DRAWN AEROBIC AND ANAEROBIC  Final   Culture   Final  GRAM POSITIVE COCCI IN CLUSTERS Gram Stain Report Called to,Read Back By and Verified With: KEITH A. AT 0625A ON [id number] BY THOMPSON S.    Report Status PENDING  Incomplete    Anti-infectives    Start     Dose/Rate Route Frequency Ordered Stop   01/07/15 1000  vancomycin (VANCOCIN) 1,250 mg in sodium chloride 0.9 % 250 mL IVPB     1,250 mg166.7 mL/hr over 90 Minutes Intravenous Every 24 hours 01/06/15 0855     01/06/15 1000  piperacillin-tazobactam (ZOSYN) IVPB 3.375 g     3.375 g12.5 mL/hr over 240 Minutes Intravenous Every 8 hours 01/06/15 0852     01/06/15 1000  vancomycin (VANCOCIN) 1,500  mg in sodium chloride 0.9 % 500 mL IVPB     1,500 mg250 mL/hr over 120 Minutes Intravenous  Once 01/06/15 0852     01/04/15 0000  cefUROXime (CEFTIN) 500 MG tablet     500 mg Oral 2 times daily with meals 01/04/15 1001     12/29/14 0000  vancomycin (VANCOCIN) 1,250 mg in sodium chloride 0.9 % 250 mL IVPB  Status:  Discontinued     1,250 mg166.7 mL/hr over 90 Minutes Intravenous Every 24 hours 12/28/14 1249 12/30/14 0923   12/25/14 0000  vancomycin (VANCOCIN) 1,250 mg in sodium chloride 0.9 % 250 mL IVPB  Status:  Discontinued     1,250 mg166.7 mL/hr over 90 Minutes Intravenous Every 12 hours 12/24/14 2017 12/28/14 1249   12/24/14 2200  ceFEPIme (MAXIPIME) 1 g in dextrose 5 % 50 mL IVPB  Status:  Discontinued     1 g100 mL/hr over 30 Minutes Intravenous 3 times per day 12/24/14 2017 12/30/14 0923   12/24/14 1615  vancomycin (VANCOCIN) IVPB 1000 mg/200 mL premix     1,000 mg200 mL/hr over 60 Minutes Intravenous  Once 12/24/14 1606 12/24/14 1750   12/24/14 1530  ceFEPIme (MAXIPIME) 2 g in dextrose 5 % 50 mL IVPB     2 g100 mL/hr over 30 Minutes Intravenous  Once 12/24/14 1516 12/24/14 1635     Assessment: 74 yo F with hx cerebral palsy who is wheelchair bound & resides at SNF.  She received IM dose of Rocephin at NH prior to admit, however continued to decline.  She was brought to ED with lethargy & fever.  Pt has been hospitalized since 12/24/14.   She is currently afebrile with normal WBC.   She has hx of Acinetobacter in urine that was resistant to cephalosporins (05/2014).   Renal function stable since admission.  Likely over-estimation of CrCl using population kinetics due to low muscle mass in wheelchair bound, elderly female.  Pt was on Vancomycin and Cefepime earlier this admission (previous notes reviewed).    Vancomycin 1/5>>1/10, 1/18 >> Zosyn 1/18 >> Cefepime 1/5>>1/11  Goal of Therapy:  Vancomycin trough level 15-20 mcg/ml  Plan:  Zosyn 3.375gm IV q8h, each dose over 4  hrs Vancomycin 1500mg  IV now x 1 then 1250mg  IV q24h Vancomycin trough at steady state  Monitor renal function, progress and cx data   Hart Robinsons A 01/06/2015,8:56 AM

## 2015-01-06 NOTE — Progress Notes (Signed)
Subjective: The events of yesterday are noted. She has positive blood cultures with what is probably Staphylococcus. The full identification is pending. She had to be reintubated.  Objective: Vital signs in last 24 hours: Temp:  [97.6 F (36.4 C)-100.2 F (37.9 C)] 97.6 F (36.4 C) (01/18 0400) Pulse Rate:  [38-118] 77 (01/18 0630) Resp:  [8-32] 11 (01/18 0630) BP: (72-126)/(34-100) 85/55 mmHg (01/18 0630) SpO2:  [88 %-100 %] 95 % (01/18 0709) FiO2 (%):  [40 %-100 %] 40 % (01/18 0709) Weight:  [85 kg (187 lb 6.3 oz)] 85 kg (187 lb 6.3 oz) (01/18 0500) Weight change: -2 kg (-4 lb 6.5 oz) Last BM Date: 01/05/15  Intake/Output from previous day: 01/17 0701 - 01/18 0700 In: 1819.8 [P.O.:357; I.V.:1462.8] Out: 1875 [Urine:1875]  PHYSICAL EXAM General appearance: Intubated, sedated on mechanical ventilation Resp: rhonchi bilaterally Cardio: regular rate and rhythm, S1, S2 normal, no murmur, click, rub or gallop GI: soft, non-tender; bowel sounds normal; no masses,  no organomegaly Extremities: extremities normal, atraumatic, no cyanosis or edema  Lab Results:  Results for orders placed or performed during the hospital encounter of 12/24/14 (from the past 48 hour(s))  Glucose, capillary     Status: Abnormal   Collection Time: 01/04/15  8:12 AM  Result Value Ref Range   Glucose-Capillary 114 (H) 70 - 99 mg/dL   Comment 1 Documented in Chart    Comment 2 Notify RN   Glucose, capillary     Status: Abnormal   Collection Time: 01/04/15 12:08 PM  Result Value Ref Range   Glucose-Capillary 145 (H) 70 - 99 mg/dL   Comment 1 Documented in Chart    Comment 2 Notify RN   Glucose, capillary     Status: Abnormal   Collection Time: 01/04/15  4:40 PM  Result Value Ref Range   Glucose-Capillary 111 (H) 70 - 99 mg/dL   Comment 1 Documented in Chart    Comment 2 Notify RN   Glucose, capillary     Status: Abnormal   Collection Time: 01/04/15  8:23 PM  Result Value Ref Range    Glucose-Capillary 125 (H) 70 - 99 mg/dL   Comment 1 Documented in Chart    Comment 2 Notify RN   Glucose, capillary     Status: None   Collection Time: 01/05/15 12:11 AM  Result Value Ref Range   Glucose-Capillary 95 70 - 99 mg/dL   Comment 1 Notify RN    Comment 2 Documented in Chart   Glucose, capillary     Status: Abnormal   Collection Time: 01/05/15  4:12 AM  Result Value Ref Range   Glucose-Capillary 124 (H) 70 - 99 mg/dL   Comment 1 Documented in Chart    Comment 2 Notify RN   Glucose, capillary     Status: None   Collection Time: 01/05/15  7:24 AM  Result Value Ref Range   Glucose-Capillary 97 70 - 99 mg/dL   Comment 1 Documented in Chart    Comment 2 Notify RN   Culture, blood (routine x 2)     Status: None (Preliminary result)   Collection Time: 01/05/15 10:16 AM  Result Value Ref Range   Specimen Description LEFT ANTECUBITAL    Special Requests BOTTLES DRAWN AEROBIC AND ANAEROBIC    Culture      GRAM POSITIVE COCCI IN CLUSTERS Gram Stain Report Called to,Read Back By and Verified With: KEITH A. AT 0625A ON [id number] BY THOMPSON S.    Report Status PENDING  CBC with Differential     Status: Abnormal   Collection Time: 01/05/15 10:27 AM  Result Value Ref Range   WBC 6.7 4.0 - 10.5 K/uL   RBC 4.76 3.87 - 5.11 MIL/uL   Hemoglobin 12.6 12.0 - 15.0 g/dL   HCT 44.7 36.0 - 46.0 %   MCV 93.9 78.0 - 100.0 fL   MCH 26.5 26.0 - 34.0 pg   MCHC 28.2 (L) 30.0 - 36.0 g/dL   RDW 16.2 (H) 11.5 - 15.5 %   Platelets 175 150 - 400 K/uL   Neutrophils Relative % 84 (H) 43 - 77 %   Neutro Abs 5.7 1.7 - 7.7 K/uL   Lymphocytes Relative 12 12 - 46 %   Lymphs Abs 0.8 0.7 - 4.0 K/uL   Monocytes Relative 3 3 - 12 %   Monocytes Absolute 0.2 0.1 - 1.0 K/uL   Eosinophils Relative 1 0 - 5 %   Eosinophils Absolute 0.1 0.0 - 0.7 K/uL   Basophils Relative 0 0 - 1 %   Basophils Absolute 0.0 0.0 - 0.1 K/uL  Comprehensive metabolic panel     Status: Abnormal   Collection Time: 01/05/15 10:27 AM   Result Value Ref Range   Sodium 143 135 - 145 mmol/L    Comment: Please note change in reference range.   Potassium 4.1 3.5 - 5.1 mmol/L    Comment: Please note change in reference range.   Chloride 99 96 - 112 mEq/L   CO2 36 (H) 19 - 32 mmol/L   Glucose, Bld 165 (H) 70 - 99 mg/dL   BUN 23 6 - 23 mg/dL   Creatinine, Ser 0.39 (L) 0.50 - 1.10 mg/dL   Calcium 9.1 8.4 - 10.5 mg/dL   Total Protein 6.3 6.0 - 8.3 g/dL   Albumin 3.8 3.5 - 5.2 g/dL   AST 16 0 - 37 U/L   ALT 17 0 - 35 U/L   Alkaline Phosphatase 45 39 - 117 U/L   Total Bilirubin 0.9 0.3 - 1.2 mg/dL   GFR calc non Af Amer >90 >90 mL/min   GFR calc Af Amer >90 >90 mL/min    Comment: (NOTE) The eGFR has been calculated using the CKD EPI equation. This calculation has not been validated in all clinical situations. eGFR's persistently <90 mL/min signify possible Chronic Kidney Disease.    Anion gap 8 5 - 15  Glucose, capillary     Status: Abnormal   Collection Time: 01/05/15 11:36 AM  Result Value Ref Range   Glucose-Capillary 146 (H) 70 - 99 mg/dL   Comment 1 Documented in Chart    Comment 2 Notify RN   Glucose, capillary     Status: Abnormal   Collection Time: 01/05/15  1:58 PM  Result Value Ref Range   Glucose-Capillary 193 (H) 70 - 99 mg/dL  Blood gas, arterial     Status: Abnormal   Collection Time: 01/05/15  2:08 PM  Result Value Ref Range   FIO2 100.00 %   Delivery systems OXYGEN MASK    pH, Arterial 7.178 (LL) 7.350 - 7.450    Comment: CRITICAL RESULT CALLED TO, READ BACK BY AND VERIFIED WITH: MISTY MCDANIELS,RN AT 1415 BY VANESSA LAWSON,RRT,RCP ON 01/06/2016    pCO2 arterial 106.0 (HH) 35.0 - 45.0 mmHg    Comment: CRITICAL RESULT CALLED TO, READ BACK BY AND VERIFIED WITH: MISTY MCDANIELS,RN AT 1415 BY VANESSA LAWSON,RRT,RCP ON 01/05/2015    pO2, Arterial 164.0 (H) 80.0 - 100.0 mmHg  Bicarbonate 37.6 (H) 20.0 - 24.0 mEq/L   TCO2 35.5 0 - 100 mmol/L   Acid-Base Excess 9.3 (H) 0.0 - 2.0 mmol/L   O2  Saturation 98.2 %   Patient temperature 37.0    Collection site LEFT RADIAL    Drawn by COLLECTED BY RT    Sample type ARTERIAL    Allens test (pass/fail) PASS PASS  Protime-INR     Status: None   Collection Time: 01/05/15  2:45 PM  Result Value Ref Range   Prothrombin Time 13.0 11.6 - 15.2 seconds   INR 0.97 0.00 - 1.49  Phosphorus     Status: Abnormal   Collection Time: 01/05/15  2:45 PM  Result Value Ref Range   Phosphorus 7.5 (H) 2.3 - 4.6 mg/dL  Magnesium     Status: Abnormal   Collection Time: 01/05/15  2:45 PM  Result Value Ref Range   Magnesium 2.6 (H) 1.5 - 2.5 mg/dL  CBC     Status: Abnormal   Collection Time: 01/05/15  2:45 PM  Result Value Ref Range   WBC 9.8 4.0 - 10.5 K/uL   RBC 5.30 (H) 3.87 - 5.11 MIL/uL   Hemoglobin 14.3 12.0 - 15.0 g/dL   HCT 50.8 (H) 36.0 - 46.0 %   MCV 95.8 78.0 - 100.0 fL   MCH 27.0 26.0 - 34.0 pg   MCHC 28.1 (L) 30.0 - 36.0 g/dL   RDW 16.3 (H) 11.5 - 15.5 %   Platelets 239 150 - 400 K/uL    Comment: DELTA CHECK NOTED  Basic metabolic panel     Status: Abnormal   Collection Time: 01/05/15  2:45 PM  Result Value Ref Range   Sodium 144 135 - 145 mmol/L    Comment: Please note change in reference range.   Potassium 6.4 (HH) 3.5 - 5.1 mmol/L    Comment: DELTA CHECK NOTED CRITICAL RESULT CALLED TO, READ BACK BY AND VERIFIED WITH: HEATH S. AT 1527 ON [id number] BY THOMPSON S. Please note change in reference range.    Chloride 100 96 - 112 mEq/L   CO2 37 (H) 19 - 32 mmol/L   Glucose, Bld 194 (H) 70 - 99 mg/dL   BUN 26 (H) 6 - 23 mg/dL   Creatinine, Ser 0.45 (L) 0.50 - 1.10 mg/dL   Calcium 8.6 8.4 - 10.5 mg/dL   GFR calc non Af Amer >90 >90 mL/min   GFR calc Af Amer >90 >90 mL/min    Comment: (NOTE) The eGFR has been calculated using the CKD EPI equation. This calculation has not been validated in all clinical situations. eGFR's persistently <90 mL/min signify possible Chronic Kidney Disease.    Anion gap 7 5 - 15  APTT     Status:  Abnormal   Collection Time: 01/05/15  2:45 PM  Result Value Ref Range   aPTT 21 (L) 24 - 37 seconds  Troponin I     Status: None   Collection Time: 01/05/15  2:45 PM  Result Value Ref Range   Troponin I <0.03 <0.031 ng/mL    Comment:        NO INDICATION OF MYOCARDIAL INJURY. Please note change in reference range.   Brain natriuretic peptide     Status: Abnormal   Collection Time: 01/05/15  2:45 PM  Result Value Ref Range   B Natriuretic Peptide 112.0 (H) 0.0 - 100.0 pg/mL    Comment: Please note change in reference range.  Lactic acid, plasma  Status: None   Collection Time: 01/05/15  2:53 PM  Result Value Ref Range   Lactic Acid, Venous 1.7 0.5 - 2.2 mmol/L  Glucose, capillary     Status: Abnormal   Collection Time: 01/05/15  4:16 PM  Result Value Ref Range   Glucose-Capillary 118 (H) 70 - 99 mg/dL   Comment 1 Documented in Chart    Comment 2 Notify RN   Blood gas, arterial ([facility] & [facility] ONLY)     Status: Abnormal   Collection Time: 01/05/15  4:35 PM  Result Value Ref Range   FIO2 100.00 %   Delivery systems VENTILATOR    Mode PRESSURE REGULATED VOLUME CONTROL    VT 500 mL   Rate 8 resp/min   Peep/cpap 5.0 cm H20   pH, Arterial 7.388 7.350 - 7.450   pCO2 arterial 64.3 (HH) 35.0 - 45.0 mmHg    Comment: CRITICAL RESULT CALLED TO, READ BACK BY AND VERIFIED WITH: MCDANIELS,M.RN AT 1650 01/05/15 BY BROADNAX,L.RRT    pO2, Arterial 217.0 (H) 80.0 - 100.0 mmHg   Bicarbonate 37.9 (H) 20.0 - 24.0 mEq/L   TCO2 33.4 0 - 100 mmol/L   Acid-Base Excess 12.4 (H) 0.0 - 2.0 mmol/L   O2 Saturation 99.4 %   Collection site LEFT BRACHIAL    Drawn by [id number]    Sample type ARTERIAL   Glucose, capillary     Status: Abnormal   Collection Time: 01/05/15  5:47 PM  Result Value Ref Range   Glucose-Capillary 178 (H) 70 - 99 mg/dL   Comment 1 Documented in Chart    Comment 2 Notify RN   Glucose, capillary     Status: None   Collection Time: 01/05/15  7:50 PM  Result Value Ref Range    Glucose-Capillary 99 70 - 99 mg/dL  Basic metabolic panel     Status: Abnormal   Collection Time: 01/05/15  8:12 PM  Result Value Ref Range   Sodium 147 (H) 135 - 145 mmol/L    Comment: Please note change in reference range.   Potassium 3.9 3.5 - 5.1 mmol/L    Comment: Please note change in reference range. DELTA CHECK NOTED    Chloride 101 96 - 112 mEq/L   CO2 40 (HH) 19 - 32 mmol/L    Comment: REPEATED TO VERIFY CRITICAL RESULT CALLED TO, READ BACK BY AND VERIFIED WITH: HAMLET,S AT 2055 ON 01/05/15 BY MOSLEYJ    Glucose, Bld 100 (H) 70 - 99 mg/dL   BUN 23 6 - 23 mg/dL   Creatinine, Ser 0.39 (L) 0.50 - 1.10 mg/dL   Calcium 8.4 8.4 - 10.5 mg/dL   GFR calc non Af Amer >90 >90 mL/min   GFR calc Af Amer >90 >90 mL/min    Comment: (NOTE) The eGFR has been calculated using the CKD EPI equation. This calculation has not been validated in all clinical situations. eGFR's persistently <90 mL/min signify possible Chronic Kidney Disease.    Anion gap 6 5 - 15  Glucose, capillary     Status: None   Collection Time: 01/06/15  1:00 AM  Result Value Ref Range   Glucose-Capillary 98 70 - 99 mg/dL  Blood gas, arterial     Status: Abnormal   Collection Time: 01/06/15  5:00 AM  Result Value Ref Range   FIO2 0.40 %   Delivery systems VENTILATOR    Mode PRESSURE REGULATED VOLUME CONTROL    VT 500 mL   Rate 8.0 resp/min   Peep/cpap  5.0 cm H20   pH, Arterial 7.451 (H) 7.350 - 7.450   pCO2 arterial 55.7 (H) 35.0 - 45.0 mmHg   pO2, Arterial 84.8 80.0 - 100.0 mmHg   Bicarbonate 38.2 (H) 20.0 - 24.0 mEq/L   TCO2 33.9 0 - 100 mmol/L   Acid-Base Excess 13.3 (H) 0.0 - 2.0 mmol/L   O2 Saturation 96.1 %   Patient temperature 37.0    Collection site LEFT BRACHIAL    Drawn by [id number]    Sample type ARTERIAL DRAW    Allens test (pass/fail) PASS PASS  Glucose, capillary     Status: Abnormal   Collection Time: 01/06/15  5:17 AM  Result Value Ref Range   Glucose-Capillary 113 (H) 70 - 99 mg/dL     ABGS  Recent Labs  01/06/15 0500  PHART 7.451*  PO2ART 84.8  TCO2 33.9  HCO3 38.2*   CULTURES Recent Results (from the past 240 hour(s))  Culture, blood (routine x 2)     Status: None (Preliminary result)   Collection Time: 01/05/15 10:16 AM  Result Value Ref Range Status   Specimen Description LEFT ANTECUBITAL  Final   Special Requests BOTTLES DRAWN AEROBIC AND ANAEROBIC  Final   Culture   Final    GRAM POSITIVE COCCI IN CLUSTERS Gram Stain Report Called to,Read Back By and Verified With: KEITH A. AT 0625A ON [id number] BY THOMPSON S.    Report Status PENDING  Incomplete   Studies/Results: Ct Head Wo Contrast  01/06/2015   CLINICAL DATA:  Question stroke.  Altered level of consciousness.  EXAM: CT HEAD WITHOUT CONTRAST  TECHNIQUE: Contiguous axial images were obtained from the base of the skull through the vertex without intravenous contrast.  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 This causes chronic compression of the cervicomedullary junction.  Partial opacification a left mastoid air cells which may be related to intubation. There is mild inflammatory mucosal thickening in the left maxillary sinus.  Orbits: No acute abnormality.  Brain: No evidence of acute infarction, hemorrhage, hydrocephalus, or mass lesion/mass effect. Sub lentiform fluid collections, right larger than left, dilated perivascular spaces based on previous MRI imaging.  IMPRESSION: 1. No acute intracranial finding. 2. 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 Although this finding is chronic, suggest cervical spine precautions.   Electronically Signed   By: Jorje Guild M.D.   On: 01/06/2015 00:44   Dg Chest Port 1 View  01/05/2015   CLINICAL DATA:  Ventilator dependent respiratory failure. Orogastric tube placement.  EXAM: PORTABLE CHEST - 1  VIEW 1736 hr:  COMPARISON:  Portable chest x-ray earlier same date 1448 hr and dating back to 12/28/2014.  FINDINGS: Endotracheal tube tip remains in satisfactory position approximately 3 cm above carina. Right arm PICC tip difficult to visualize but the catheter can be followed to the level of the upper SVC. OG tube tip to the left of midline, though its exact location is difficult to determine as the left hemidiaphragm is silhouetted by the dense left lower lobe consolidation. Left upper quadrant bowel gas is present below the OG tube, and it is unclear if this is the gastric bubble or if this is the splenic flexure of the colon.  Cardiac silhouette enlarged but stable. Pulmonary venous hypertension with perhaps minimal to mild interstitial pulmonary edema, worse than earlier in the day. Stable dense consolidation in the left lower lobe. Linear atelectasis at  the right lung base, unchanged.  IMPRESSION: 1. OG tube tip to the left of midline, though its exact position is difficult to determine as the diaphragm is not visible due to the dense left lower lobe atelectasis and/or pneumonia. A followup image of the upper abdomen may be helpful to determine if the bowel gas present in the left upper quadrant is the gastric air bubble or is gas in the splenic flexure the colon. 2. Developing pulmonary venous hypertension and perhaps minimal to mild interstitial pulmonary edema, query fluid overload. 3. Stable dense left lower lobe atelectasis and/or pneumonia. Stable linear atelectasis at the right lung base.   Electronically Signed   By: Evangeline Dakin M.D.   On: 01/05/2015 17:54   Dg Chest Portable 1 View  01/05/2015   CLINICAL DATA:  Intubation, history asthma, cerebral palsy, hypertension, MI  EXAM: PORTABLE CHEST - 1 VIEW  COMPARISON:  Portable exam 1448 hr compared to 12/31/2014  FINDINGS: Tip of endotracheal tube projects 2.6 cm above carina.  Rotated to the LEFT.  Enlargement of cardiac silhouette with  vascular congestion.  Bibasilar atelectasis, cannot exclude consolidation in LEFT lower lobe.  Upper lungs clear.  Tip of RIGHT arm PICC line projects over SVC.  IMPRESSION: Satisfactory endotracheal tube position.  Bibasilar atelectasis, cannot exclude consolidation in LEFT lower lobe.   Electronically Signed   By: Lavonia Dana M.D.   On: 01/05/2015 15:06    Medications:  Prior to Admission:  Prescriptions prior to admission  Medication Sig Dispense Refill Last Dose  . acetaminophen (TYLENOL) 325 MG tablet Take 2 tablets (650 mg total) by mouth every 6 (six) hours as needed for mild pain (or Fever >/= 101).   unknown  . albuterol (PROVENTIL HFA;VENTOLIN HFA) 108 (90 BASE) MCG/ACT inhaler Inhale 2 puffs into the lungs every 4 (four) hours as needed. Shortness of breath 1 Inhaler 12 unknown  . albuterol (PROVENTIL) (2.5 MG/3ML) 0.083% nebulizer solution Take 2.5 mg by nebulization 4 (four) times daily.   12/24/2014 at 1200  . ALPRAZolam (XANAX) 1 MG tablet Take 1 mg by mouth 4 (four) times daily.    12/24/2014 at 1400  . Amino Acids-Protein Hydrolys (FEEDING SUPPLEMENT, PRO-STAT SUGAR FREE 64,) LIQD Take 30 mLs by mouth 2 (two) times daily with a meal.   12/24/2014 at 800a  . aspirin 325 MG tablet Take 1 tablet (325 mg total) by mouth daily. 30 tablet 12 12/24/2014 at [place]  . atorvastatin (LIPITOR) 40 MG tablet Take 40 mg by mouth daily.   12/24/2014 at [place]  . baclofen (LIORESAL) 10 MG tablet Take 5 mg by mouth 3 (three) times daily.   12/24/2014 at 1400  . cefTRIAXone (ROCEPHIN) 1 G injection Inject 1 g into the muscle once. Last administered on 12/23/2014 at 22:00 IM to right hip   12/23/2014  . cholecalciferol (VITAMIN D) 1000 UNITS tablet Take 1,000 Units by mouth daily.   12/24/2014 at [place]  . clopidogrel (PLAVIX) 75 MG tablet Take 1 tablet (75 mg total) by mouth daily with breakfast.   12/24/2014 at [place]  . Cranberry 450 MG CAPS Take 1 capsule by mouth daily.   12/24/2014 at 900a  . guaiFENesin (MUCINEX) 600 MG  12 hr tablet Take 600 mg by mouth 2 (two) times daily.   12/24/2014 at 800a  . haloperidol (HALDOL) 0.5 MG tablet Take 0.5 mg by mouth 2 (two) times daily.    12/24/2014 at 900a  . levothyroxine (SYNTHROID, LEVOTHROID) 88 MCG tablet Take  1 tablet (88 mcg total) by mouth daily before breakfast. 30 tablet 12 12/24/2014 at [place]  . loperamide (IMODIUM) 2 MG capsule Take 1 capsule (2 mg total) by mouth as needed for diarrhea or loose stools. 30 capsule 0 unknown  . loratadine (CLARITIN) 10 MG tablet Take 10 mg by mouth daily.   12/24/2014 at [place]  . nitroGLYCERIN (NITROSTAT) 0.4 MG SL tablet Place 1 tablet (0.4 mg total) under the tongue every 5 (five) minutes as needed for chest pain. 25 tablet 12 unknown  . pantoprazole (PROTONIX) 40 MG tablet Take 1 tablet (40 mg total) by mouth daily. 30 tablet 12 12/24/2014 at 600a  . polyethylene glycol (MIRALAX / GLYCOLAX) packet Take 17 g by mouth daily.   12/24/2014 at 900a  . potassium chloride SA (K-DUR,KLOR-CON) 20 MEQ tablet Take 10 mEq by mouth daily.    12/24/2014 at [place]  . predniSONE (DELTASONE) 5 MG tablet Take 5 mg by mouth daily with breakfast.   12/24/2014 at [place]  . ranolazine (RANEXA) 500 MG 12 hr tablet Take 1 tablet (500 mg total) by mouth 2 (two) times daily. 60 tablet 3 12/24/2014 at [place]  . saccharomyces boulardii (FLORASTOR) 250 MG capsule Take 250 mg by mouth 2 (two) times daily.   12/24/2014 at 800a  . sertraline (ZOLOFT) 100 MG tablet Take 1 tablet (100 mg total) by mouth daily.   12/24/2014 at 900a  . temazepam (RESTORIL) 7.5 MG capsule Take 7.5 mg by mouth at bedtime as needed for sleep.   unknown  . traMADol (ULTRAM) 50 MG tablet Take 50 mg by mouth every 6 (six) hours as needed for moderate pain.   unknown  . trolamine salicylate (ASPERCREME) 10 % cream Apply topically as needed for muscle pain. (Patient not taking: Reported on 12/24/2014) 85 g 0 Taking  . [DISCONTINUED] furosemide (LASIX) 20 MG tablet Take 20 mg by mouth daily.   Taking   Scheduled: .  albuterol  2.5 mg Nebulization QID  . antiseptic oral rinse  7 mL Mouth Rinse QID  . chlorhexidine  15 mL Mouth Rinse BID  . enoxaparin (LOVENOX) injection  40 mg Subcutaneous Q24H  . feeding supplement (GLUCERNA SHAKE)  237 mL Oral BID BM  . furosemide  40 mg Intravenous Daily  . haloperidol  0.5 mg Oral BID  . insulin aspart  0-20 Units Subcutaneous 6 times per day  . pantoprazole  40 mg Oral Daily  . potassium chloride  40 mEq Oral BID  . predniSONE  40 mg Oral Q breakfast  . sodium chloride  10-40 mL Intracatheter Q12H   Continuous: . sodium chloride 100 mL/hr at 01/06/15 0600  . propofol 15 mcg/kg/min (01/06/15 0752)   WUX:[id number], albuterol, ALPRAZolam, fentaNYL, ondansetron (ZOFRAN) IV, sodium chloride  Assesment: She was admitted with acute on chronic respiratory failure. She was thought to have urinary tract infection and there was a question of pneumonia and of pulmonary edema. She has been treated for all of the above and had improved and was being set up to go to the nursing home but she developed some fever so her nursing home discharge was canceled. Yesterday when I saw her in the morning she looked pretty good but then developed acute respiratory distress and ended up having to be intubated and placed back on mechanical ventilation. She has positive blood cultures for what is presumably Staphylococcus. Active Problems:   Morbid obesity   Cerebral palsy   Hypertension   Sepsis   Acute and  chronic respiratory failure with hypercapnia   Encephalopathy, metabolic   UTI (urinary tract infection)    Plan: Continue current treatments. She is going to stay on the ventilator today and make an attempt at getting her off tomorrow    LOS: 13 days   Irean Kendricks L 01/06/2015, 8:02 AM

## 2015-01-07 ENCOUNTER — Inpatient Hospital Stay (HOSPITAL_COMMUNITY): Payer: Medicare Other

## 2015-01-07 LAB — BLOOD GAS, ARTERIAL
Acid-Base Excess: 10.3 mmol/L — ABNORMAL HIGH (ref 0.0–2.0)
Bicarbonate: 34 mEq/L — ABNORMAL HIGH (ref 20.0–24.0)
FIO2: 40 %
Mode: POSITIVE
O2 SAT: 98.2 %
PATIENT TEMPERATURE: 37
PEEP: 5 cmH2O
PO2 ART: 112 mmHg — AB (ref 80.0–100.0)
Pressure support: 5 cmH2O
TCO2: 30.1 mmol/L (ref 0–100)
pCO2 arterial: 42.6 mmHg (ref 35.0–45.0)
pH, Arterial: 7.513 — ABNORMAL HIGH (ref 7.350–7.450)

## 2015-01-07 LAB — GLUCOSE, CAPILLARY
GLUCOSE-CAPILLARY: 113 mg/dL — AB (ref 70–99)
GLUCOSE-CAPILLARY: 105 mg/dL — AB (ref 70–99)
GLUCOSE-CAPILLARY: 109 mg/dL — AB (ref 70–99)
GLUCOSE-CAPILLARY: 123 mg/dL — AB (ref 70–99)
Glucose-Capillary: 109 mg/dL — ABNORMAL HIGH (ref 70–99)
Glucose-Capillary: 113 mg/dL — ABNORMAL HIGH (ref 70–99)
Glucose-Capillary: 124 mg/dL — ABNORMAL HIGH (ref 70–99)

## 2015-01-07 LAB — CULTURE, BLOOD (ROUTINE X 2)

## 2015-01-07 LAB — CREATININE, SERUM
Creatinine, Ser: 0.38 mg/dL — ABNORMAL LOW (ref 0.50–1.10)
GFR calc Af Amer: >90 mL/min (ref >90)
GFR calc non Af Amer: >90 mL/min (ref >90)

## 2015-01-07 LAB — TRIGLYCERIDES: Triglycerides: 175 mg/dL — ABNORMAL HIGH (ref <150)

## 2015-01-07 MED ORDER — VITAL HIGH PROTEIN PO LIQD
1000.0000 mL | ORAL | Status: DC
  Filled 2015-01-07 (×2): qty 1000

## 2015-01-07 MED ORDER — FLUCONAZOLE IN SODIUM CHLORIDE 200-0.9 MG/100ML-% IV SOLN
200.0000 mg | Freq: Once | INTRAVENOUS | Status: AC
  Administered 2015-01-07: 200 mg via INTRAVENOUS
  Filled 2015-01-07: qty 100

## 2015-01-07 MED ORDER — INSULIN ASPART 100 UNIT/ML ~~LOC~~ SOLN
0.0000 [IU] | Freq: Three times a day (TID) | SUBCUTANEOUS | Status: DC
  Administered 2015-01-08: 3 [IU] via SUBCUTANEOUS
  Administered 2015-01-08: 4 [IU] via SUBCUTANEOUS
  Administered 2015-01-08 – 2015-01-10 (×5): 3 [IU] via SUBCUTANEOUS

## 2015-01-07 MED ORDER — FLUCONAZOLE 100MG IVPB
100.0000 mg | INTRAVENOUS | Status: DC
  Administered 2015-01-08 – 2015-01-09 (×2): 100 mg via INTRAVENOUS
  Filled 2015-01-07 (×5): qty 50

## 2015-01-07 NOTE — Clinical Documentation Improvement (Signed)
Please specify diagnosis related to below supporting documentation if appropriate.    Possible Clinical Conditions? Severe Malnutrition   Severe Protein Calorie Malnutrition Other Condition Cannot clinically determine  Supporting Information: Per 01/07/15 MD progress note = She does have malnutrition and if she's not able to be extubated today will need to be on tube feedings.  Per 01/06/15 RD assessment note =  Pt meets criteria for severe MALNUTRITION in the context of acute illness as evidenced by </=50% energy intake for >/= 5 days and >2% wt loss in 1 week.    Thank You, Serena Colonel ,RN Clinical Documentation Specialist:  [facility] [facility] Information Management

## 2015-01-07 NOTE — Procedures (Signed)
Extubation Procedure Note  Patient Details:   Name: [name] DOB: 22-Oct-1941 MRN: [id number]   Airway Documentation:  Airway 7.5 mm (Active)  Secured at (cm) 23 cm 01/07/2015 11:13 AM  Measured From Lips 01/07/2015 11:13 AM  [place] 01/07/2015 11:13 AM  Secured By [employer] 01/07/2015 11:13 AM  Tube Holder Repositioned Yes 01/07/2015 11:13 AM  Cuff Pressure (cm H2O) 24 cm H2O 01/06/2015 11:51 PM  Site Condition Dry 01/07/2015 11:13 AM    Evaluation  O2 sats: stable throughout Complications: No apparent complications Patient did tolerate procedure well. Bilateral Breath Sounds: Diminished Suctioning: Airway Yes  Thelma Comp [facility] 01/07/2015, 3:37 PM

## 2015-01-07 NOTE — Progress Notes (Signed)
Subjective: She is still intubated and on the ventilator. She looks more comfortable. No new problems have been noted.  Objective: Vital signs in last 24 hours: Temp:  [98.5 F (36.9 C)-100.3 F (37.9 C)] 98.5 F (36.9 C) (01/19 0400) Pulse Rate:  [31-126] 72 (01/19 0600) Resp:  [8-28] 19 (01/19 0600) BP: (79-126)/(42-114) 119/62 mmHg (01/19 0600) SpO2:  [91 %-100 %] 97 % (01/19 0600) FiO2 (%):  [40 %] 40 % (01/19 0435) Weight:  [87.6 kg (193 lb 2 oz)] 87.6 kg (193 lb 2 oz) (01/19 0500) Weight change: 2.6 kg (5 lb 11.7 oz) Last BM Date: 01/06/15  Intake/Output from previous day: 01/18 0701 - 01/19 0700 In: 2529.4 [I.V.:2379.4; IV Piggyback:150] Out: 1400 [Urine:1400]  PHYSICAL EXAM General appearance: alert, mild distress and Intubated and on the ventilator Resp: clear to auscultation bilaterally Cardio: regular rate and rhythm, S1, S2 normal, no murmur, click, rub or gallop GI: soft, non-tender; bowel sounds normal; no masses,  no organomegaly Extremities: extremities normal, atraumatic, no cyanosis or edema  Lab Results:  Results for orders placed or performed during the hospital encounter of 12/24/14 (from the past 48 hour(s))  Culture, blood (routine x 2)     Status: None (Preliminary result)   Collection Time: 01/05/15 10:16 AM  Result Value Ref Range   Specimen Description LEFT ANTECUBITAL    Special Requests BOTTLES DRAWN AEROBIC AND ANAEROBIC    Culture      GRAM POSITIVE COCCI IN CLUSTERS        BLOOD CULTURE RECEIVED NO GROWTH TO DATE CULTURE WILL BE HELD FOR 5 DAYS BEFORE ISSUING A FINAL NEGATIVE REPORT Note: Gram Stain Report Called to,Read Back By and Verified With: KEITH A 11/04/15 0625 BY THOMPSON S Performed at [facility] Performed at [facility]    Report Status PENDING   CBC with Differential     Status: Abnormal   Collection Time: 01/05/15 10:27 AM  Result Value Ref Range   WBC 6.7 4.0 - 10.5 K/uL   RBC 4.76 3.87 - 5.11 MIL/uL    Hemoglobin 12.6 12.0 - 15.0 g/dL   HCT 44.7 36.0 - 46.0 %   MCV 93.9 78.0 - 100.0 fL   MCH 26.5 26.0 - 34.0 pg   MCHC 28.2 (L) 30.0 - 36.0 g/dL   RDW 16.2 (H) 11.5 - 15.5 %   Platelets 175 150 - 400 K/uL   Neutrophils Relative % 84 (H) 43 - 77 %   Neutro Abs 5.7 1.7 - 7.7 K/uL   Lymphocytes Relative 12 12 - 46 %   Lymphs Abs 0.8 0.7 - 4.0 K/uL   Monocytes Relative 3 3 - 12 %   Monocytes Absolute 0.2 0.1 - 1.0 K/uL   Eosinophils Relative 1 0 - 5 %   Eosinophils Absolute 0.1 0.0 - 0.7 K/uL   Basophils Relative 0 0 - 1 %   Basophils Absolute 0.0 0.0 - 0.1 K/uL  Comprehensive metabolic panel     Status: Abnormal   Collection Time: 01/05/15 10:27 AM  Result Value Ref Range   Sodium 143 135 - 145 mmol/L    Comment: Please note change in reference range.   Potassium 4.1 3.5 - 5.1 mmol/L    Comment: Please note change in reference range.   Chloride 99 96 - 112 mEq/L   CO2 36 (H) 19 - 32 mmol/L   Glucose, Bld 165 (H) 70 - 99 mg/dL   BUN 23 6 - 23 mg/dL  Creatinine, Ser 0.39 (L) 0.50 - 1.10 mg/dL   Calcium 9.1 8.4 - 10.5 mg/dL   Total Protein 6.3 6.0 - 8.3 g/dL   Albumin 3.8 3.5 - 5.2 g/dL   AST 16 0 - 37 U/L   ALT 17 0 - 35 U/L   Alkaline Phosphatase 45 39 - 117 U/L   Total Bilirubin 0.9 0.3 - 1.2 mg/dL   GFR calc non Af Amer >90 >90 mL/min   GFR calc Af Amer >90 >90 mL/min    Comment: (NOTE) The eGFR has been calculated using the CKD EPI equation. This calculation has not been validated in all clinical situations. eGFR's persistently <90 mL/min signify possible Chronic Kidney Disease.    Anion gap 8 5 - 15  Culture, blood (routine x 2)     Status: None (Preliminary result)   Collection Time: 01/05/15 10:27 AM  Result Value Ref Range   Specimen Description PORTA CATH    Special Requests BOTTLES DRAWN AEROBIC AND ANAEROBIC    Culture NO GROWTH 1 DAY    Report Status PENDING   Urine culture     Status: None   Collection Time: 01/05/15 11:19 AM  Result Value Ref Range    Specimen Description URINE, CLEAN CATCH    Special Requests Normal    Colony Count      >=100,000 COLONIES/ML Performed at [facility]    Culture YEAST Performed at [facility]     Report Status 01/06/2015 FINAL   Glucose, capillary     Status: Abnormal   Collection Time: 01/05/15 11:36 AM  Result Value Ref Range   Glucose-Capillary 146 (H) 70 - 99 mg/dL   Comment 1 Documented in Chart    Comment 2 Notify RN   Glucose, capillary     Status: Abnormal   Collection Time: 01/05/15  1:58 PM  Result Value Ref Range   Glucose-Capillary 193 (H) 70 - 99 mg/dL  Blood gas, arterial     Status: Abnormal   Collection Time: 01/05/15  2:08 PM  Result Value Ref Range   FIO2 100.00 %   Delivery systems OXYGEN MASK    pH, Arterial 7.178 (LL) 7.350 - 7.450    Comment: CRITICAL RESULT CALLED TO, READ BACK BY AND VERIFIED WITH: MISTY MCDANIELS,RN AT 1415 BY VANESSA LAWSON,RRT,RCP ON 01/06/2016    pCO2 arterial 106.0 (HH) 35.0 - 45.0 mmHg    Comment: CRITICAL RESULT CALLED TO, READ BACK BY AND VERIFIED WITH: MISTY MCDANIELS,RN AT 1415 BY VANESSA LAWSON,RRT,RCP ON 01/05/2015    pO2, Arterial 164.0 (H) 80.0 - 100.0 mmHg   Bicarbonate 37.6 (H) 20.0 - 24.0 mEq/L   TCO2 35.5 0 - 100 mmol/L   Acid-Base Excess 9.3 (H) 0.0 - 2.0 mmol/L   O2 Saturation 98.2 %   Patient temperature 37.0    Collection site LEFT RADIAL    Drawn by COLLECTED BY RT    Sample type ARTERIAL    Allens test (pass/fail) PASS PASS  Protime-INR     Status: None   Collection Time: 01/05/15  2:45 PM  Result Value Ref Range   Prothrombin Time 13.0 11.6 - 15.2 seconds   INR 0.97 0.00 - 1.49  Phosphorus     Status: Abnormal   Collection Time: 01/05/15  2:45 PM  Result Value Ref Range   Phosphorus 7.5 (H) 2.3 - 4.6 mg/dL  Magnesium     Status: Abnormal   Collection Time: 01/05/15  2:45 PM  Result Value Ref Range  Magnesium 2.6 (H) 1.5 - 2.5 mg/dL  CBC     Status: Abnormal   Collection Time: 01/05/15  2:45 PM   Result Value Ref Range   WBC 9.8 4.0 - 10.5 K/uL   RBC 5.30 (H) 3.87 - 5.11 MIL/uL   Hemoglobin 14.3 12.0 - 15.0 g/dL   HCT 50.8 (H) 36.0 - 46.0 %   MCV 95.8 78.0 - 100.0 fL   MCH 27.0 26.0 - 34.0 pg   MCHC 28.1 (L) 30.0 - 36.0 g/dL   RDW 16.3 (H) 11.5 - 15.5 %   Platelets 239 150 - 400 K/uL    Comment: DELTA CHECK NOTED  Basic metabolic panel     Status: Abnormal   Collection Time: 01/05/15  2:45 PM  Result Value Ref Range   Sodium 144 135 - 145 mmol/L    Comment: Please note change in reference range.   Potassium 6.4 (HH) 3.5 - 5.1 mmol/L    Comment: DELTA CHECK NOTED CRITICAL RESULT CALLED TO, READ BACK BY AND VERIFIED WITH: HEATH S. AT 1527 ON [id number] BY THOMPSON S. Please note change in reference range.    Chloride 100 96 - 112 mEq/L   CO2 37 (H) 19 - 32 mmol/L   Glucose, Bld 194 (H) 70 - 99 mg/dL   BUN 26 (H) 6 - 23 mg/dL   Creatinine, Ser 0.45 (L) 0.50 - 1.10 mg/dL   Calcium 8.6 8.4 - 10.5 mg/dL   GFR calc non Af Amer >90 >90 mL/min   GFR calc Af Amer >90 >90 mL/min    Comment: (NOTE) The eGFR has been calculated using the CKD EPI equation. This calculation has not been validated in all clinical situations. eGFR's persistently <90 mL/min signify possible Chronic Kidney Disease.    Anion gap 7 5 - 15  APTT     Status: Abnormal   Collection Time: 01/05/15  2:45 PM  Result Value Ref Range   aPTT 21 (L) 24 - 37 seconds  Troponin I     Status: None   Collection Time: 01/05/15  2:45 PM  Result Value Ref Range   Troponin I <0.03 <0.031 ng/mL    Comment:        NO INDICATION OF MYOCARDIAL INJURY. Please note change in reference range.   Brain natriuretic peptide     Status: Abnormal   Collection Time: 01/05/15  2:45 PM  Result Value Ref Range   B Natriuretic Peptide 112.0 (H) 0.0 - 100.0 pg/mL    Comment: Please note change in reference range.  Lactic acid, plasma     Status: None   Collection Time: 01/05/15  2:53 PM  Result Value Ref Range   Lactic Acid,  Venous 1.7 0.5 - 2.2 mmol/L  Glucose, capillary     Status: Abnormal   Collection Time: 01/05/15  4:16 PM  Result Value Ref Range   Glucose-Capillary 118 (H) 70 - 99 mg/dL   Comment 1 Documented in Chart    Comment 2 Notify RN   Blood gas, arterial ([facility] & [facility] ONLY)     Status: Abnormal   Collection Time: 01/05/15  4:35 PM  Result Value Ref Range   FIO2 100.00 %   Delivery systems VENTILATOR    Mode PRESSURE REGULATED VOLUME CONTROL    VT 500 mL   Rate 8 resp/min   Peep/cpap 5.0 cm H20   pH, Arterial 7.388 7.350 - 7.450   pCO2 arterial 64.3 (HH) 35.0 - 45.0 mmHg  Comment: CRITICAL RESULT CALLED TO, READ BACK BY AND VERIFIED WITH: MCDANIELS,M.RN AT 1650 01/05/15 BY BROADNAX,L.RRT    pO2, Arterial 217.0 (H) 80.0 - 100.0 mmHg   Bicarbonate 37.9 (H) 20.0 - 24.0 mEq/L   TCO2 33.4 0 - 100 mmol/L   Acid-Base Excess 12.4 (H) 0.0 - 2.0 mmol/L   O2 Saturation 99.4 %   Collection site LEFT BRACHIAL    Drawn by [id number]    Sample type ARTERIAL   Glucose, capillary     Status: Abnormal   Collection Time: 01/05/15  5:47 PM  Result Value Ref Range   Glucose-Capillary 178 (H) 70 - 99 mg/dL   Comment 1 Documented in Chart    Comment 2 Notify RN   Glucose, capillary     Status: None   Collection Time: 01/05/15  7:50 PM  Result Value Ref Range   Glucose-Capillary 99 70 - 99 mg/dL  Basic metabolic panel     Status: Abnormal   Collection Time: 01/05/15  8:12 PM  Result Value Ref Range   Sodium 147 (H) 135 - 145 mmol/L    Comment: Please note change in reference range.   Potassium 3.9 3.5 - 5.1 mmol/L    Comment: Please note change in reference range. DELTA CHECK NOTED    Chloride 101 96 - 112 mEq/L   CO2 40 (HH) 19 - 32 mmol/L    Comment: REPEATED TO VERIFY CRITICAL RESULT CALLED TO, READ BACK BY AND VERIFIED WITH: HAMLET,S AT 2055 ON 01/05/15 BY MOSLEYJ    Glucose, Bld 100 (H) 70 - 99 mg/dL   BUN 23 6 - 23 mg/dL   Creatinine, Ser 0.39 (L) 0.50 - 1.10 mg/dL   Calcium 8.4 8.4 - 10.5  mg/dL   GFR calc non Af Amer >90 >90 mL/min   GFR calc Af Amer >90 >90 mL/min    Comment: (NOTE) The eGFR has been calculated using the CKD EPI equation. This calculation has not been validated in all clinical situations. eGFR's persistently <90 mL/min signify possible Chronic Kidney Disease.    Anion gap 6 5 - 15  Glucose, capillary     Status: None   Collection Time: 01/06/15  1:00 AM  Result Value Ref Range   Glucose-Capillary 98 70 - 99 mg/dL  Blood gas, arterial     Status: Abnormal   Collection Time: 01/06/15  5:00 AM  Result Value Ref Range   FIO2 0.40 %   Delivery systems VENTILATOR    Mode PRESSURE REGULATED VOLUME CONTROL    VT 500 mL   Rate 8.0 resp/min   Peep/cpap 5.0 cm H20   pH, Arterial 7.451 (H) 7.350 - 7.450   pCO2 arterial 55.7 (H) 35.0 - 45.0 mmHg   pO2, Arterial 84.8 80.0 - 100.0 mmHg   Bicarbonate 38.2 (H) 20.0 - 24.0 mEq/L   TCO2 33.9 0 - 100 mmol/L   Acid-Base Excess 13.3 (H) 0.0 - 2.0 mmol/L   O2 Saturation 96.1 %   Patient temperature 37.0    Collection site LEFT BRACHIAL    Drawn by [id number]    Sample type ARTERIAL DRAW    Allens test (pass/fail) PASS PASS  Glucose, capillary     Status: Abnormal   Collection Time: 01/06/15  5:17 AM  Result Value Ref Range   Glucose-Capillary 113 (H) 70 - 99 mg/dL  Glucose, capillary     Status: Abnormal   Collection Time: 01/06/15  8:08 AM  Result Value Ref Range   Glucose-Capillary 129 (  H) 70 - 99 mg/dL   Comment 1 Notify RN   Glucose, capillary     Status: Abnormal   Collection Time: 01/06/15 11:51 AM  Result Value Ref Range   Glucose-Capillary 118 (H) 70 - 99 mg/dL   Comment 1 Notify RN   Glucose, capillary     Status: Abnormal   Collection Time: 01/06/15  4:46 PM  Result Value Ref Range   Glucose-Capillary 125 (H) 70 - 99 mg/dL   Comment 1 Notify RN   Glucose, capillary     Status: Abnormal   Collection Time: 01/07/15 12:31 AM  Result Value Ref Range   Glucose-Capillary 113 (H) 70 - 99 mg/dL    Comment 1 Notify RN   Glucose, capillary     Status: Abnormal   Collection Time: 01/07/15  3:50 AM  Result Value Ref Range   Glucose-Capillary 105 (H) 70 - 99 mg/dL   Comment 1 Notify RN   Creatinine, serum     Status: Abnormal   Collection Time: 01/07/15  5:41 AM  Result Value Ref Range   Creatinine, Ser 0.38 (L) 0.50 - 1.10 mg/dL   GFR calc non Af Amer >90 >90 mL/min   GFR calc Af Amer >90 >90 mL/min    Comment: (NOTE) The eGFR has been calculated using the CKD EPI equation. This calculation has not been validated in all clinical situations. eGFR's persistently <90 mL/min signify possible Chronic Kidney Disease.   Triglycerides     Status: Abnormal   Collection Time: 01/07/15  5:41 AM  Result Value Ref Range   Triglycerides 175 (H) <150 mg/dL    ABGS  Recent Labs  01/06/15 0500  PHART 7.451*  PO2ART 84.8  TCO2 33.9  HCO3 38.2*   CULTURES Recent Results (from the past 240 hour(s))  Culture, blood (routine x 2)     Status: None (Preliminary result)   Collection Time: 01/05/15 10:16 AM  Result Value Ref Range Status   Specimen Description LEFT ANTECUBITAL  Final   Special Requests BOTTLES DRAWN AEROBIC AND ANAEROBIC  Final   Culture   Final    GRAM POSITIVE COCCI IN CLUSTERS        BLOOD CULTURE RECEIVED NO GROWTH TO DATE CULTURE WILL BE HELD FOR 5 DAYS BEFORE ISSUING A FINAL NEGATIVE REPORT Note: Gram Stain Report Called to,Read Back By and Verified With: KEITH A 11/04/15 0625 BY THOMPSON S Performed at [facility] Performed at [facility]    Report Status PENDING  Incomplete  Culture, blood (routine x 2)     Status: None (Preliminary result)   Collection Time: 01/05/15 10:27 AM  Result Value Ref Range Status   Specimen Description PORTA CATH  Final   Special Requests BOTTLES DRAWN AEROBIC AND ANAEROBIC  Final   Culture NO GROWTH 1 DAY  Final   Report Status PENDING  Incomplete  Urine culture     Status: None   Collection Time: 01/05/15 11:19 AM   Result Value Ref Range Status   Specimen Description URINE, CLEAN CATCH  Final   Special Requests Normal  Final   Colony Count   Final    >=100,000 COLONIES/ML Performed at [place] Performed at [facility]   Final   Report Status 01/06/2015 FINAL  Final   Studies/Results: Ct Head Wo Contrast  01/06/2015   CLINICAL DATA:  Question stroke.  Altered level of consciousness.  EXAM: CT HEAD WITHOUT CONTRAST  TECHNIQUE:  Contiguous axial images were obtained from the base of the skull through the vertex without intravenous contrast.  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 This causes chronic compression of the cervicomedullary junction.  Partial opacification a left mastoid air cells which may be related to intubation. There is mild inflammatory mucosal thickening in the left maxillary sinus.  Orbits: No acute abnormality.  Brain: No evidence of acute infarction, hemorrhage, hydrocephalus, or mass lesion/mass effect. Sub lentiform fluid collections, right larger than left, dilated perivascular spaces based on previous MRI imaging.  IMPRESSION: 1. No acute intracranial finding. 2. 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 Although this finding is chronic, suggest cervical spine precautions.   Electronically Signed   By: Jorje Guild M.D.   On: 01/06/2015 00:44   Dg Chest Port 1 View  01/05/2015   CLINICAL DATA:  Ventilator dependent respiratory failure. Orogastric tube placement.  EXAM: PORTABLE CHEST - 1 VIEW 1736 hr:  COMPARISON:  Portable chest x-ray earlier same date 1448 hr and dating back to 12/28/2014.  FINDINGS: Endotracheal tube tip remains in satisfactory position approximately 3 cm above carina. Right arm PICC tip difficult to visualize but the catheter can be followed to the level of the  upper SVC. OG tube tip to the left of midline, though its exact location is difficult to determine as the left hemidiaphragm is silhouetted by the dense left lower lobe consolidation. Left upper quadrant bowel gas is present below the OG tube, and it is unclear if this is the gastric bubble or if this is the splenic flexure of the colon.  Cardiac silhouette enlarged but stable. Pulmonary venous hypertension with perhaps minimal to mild interstitial pulmonary edema, worse than earlier in the day. Stable dense consolidation in the left lower lobe. Linear atelectasis at the right lung base, unchanged.  IMPRESSION: 1. OG tube tip to the left of midline, though its exact position is difficult to determine as the diaphragm is not visible due to the dense left lower lobe atelectasis and/or pneumonia. A followup image of the upper abdomen may be helpful to determine if the bowel gas present in the left upper quadrant is the gastric air bubble or is gas in the splenic flexure the colon. 2. Developing pulmonary venous hypertension and perhaps minimal to mild interstitial pulmonary edema, query fluid overload. 3. Stable dense left lower lobe atelectasis and/or pneumonia. Stable linear atelectasis at the right lung base.   Electronically Signed   By: Evangeline Dakin M.D.   On: 01/05/2015 17:54   Dg Chest Portable 1 View  01/05/2015   CLINICAL DATA:  Intubation, history asthma, cerebral palsy, hypertension, MI  EXAM: PORTABLE CHEST - 1 VIEW  COMPARISON:  Portable exam 1448 hr compared to 12/31/2014  FINDINGS: Tip of endotracheal tube projects 2.6 cm above carina.  Rotated to the LEFT.  Enlargement of cardiac silhouette with vascular congestion.  Bibasilar atelectasis, cannot exclude consolidation in LEFT lower lobe.  Upper lungs clear.  Tip of RIGHT arm PICC line projects over SVC.  IMPRESSION: Satisfactory endotracheal tube position.  Bibasilar atelectasis, cannot exclude consolidation in LEFT lower lobe.   Electronically  Signed   By: Lavonia Dana M.D.   On: 01/05/2015 15:06    Medications:  Prior to Admission:  Prescriptions prior to admission  Medication Sig Dispense Refill Last Dose  . acetaminophen (TYLENOL) 325 MG tablet Take 2 tablets (650 mg total)  by mouth every 6 (six) hours as needed for mild pain (or Fever >/= 101).   unknown  . albuterol (PROVENTIL HFA;VENTOLIN HFA) 108 (90 BASE) MCG/ACT inhaler Inhale 2 puffs into the lungs every 4 (four) hours as needed. Shortness of breath 1 Inhaler 12 unknown  . albuterol (PROVENTIL) (2.5 MG/3ML) 0.083% nebulizer solution Take 2.5 mg by nebulization 4 (four) times daily.   12/24/2014 at 1200  . ALPRAZolam (XANAX) 1 MG tablet Take 1 mg by mouth 4 (four) times daily.    12/24/2014 at 1400  . Amino Acids-Protein Hydrolys (FEEDING SUPPLEMENT, PRO-STAT SUGAR FREE 64,) LIQD Take 30 mLs by mouth 2 (two) times daily with a meal.   12/24/2014 at 800a  . aspirin 325 MG tablet Take 1 tablet (325 mg total) by mouth daily. 30 tablet 12 12/24/2014 at [place]  . atorvastatin (LIPITOR) 40 MG tablet Take 40 mg by mouth daily.   12/24/2014 at [place]  . baclofen (LIORESAL) 10 MG tablet Take 5 mg by mouth 3 (three) times daily.   12/24/2014 at 1400  . cefTRIAXone (ROCEPHIN) 1 G injection Inject 1 g into the muscle once. Last administered on 12/23/2014 at 22:00 IM to right hip   12/23/2014  . cholecalciferol (VITAMIN D) 1000 UNITS tablet Take 1,000 Units by mouth daily.   12/24/2014 at [place]  . clopidogrel (PLAVIX) 75 MG tablet Take 1 tablet (75 mg total) by mouth daily with breakfast.   12/24/2014 at [place]  . Cranberry 450 MG CAPS Take 1 capsule by mouth daily.   12/24/2014 at 900a  . guaiFENesin (MUCINEX) 600 MG 12 hr tablet Take 600 mg by mouth 2 (two) times daily.   12/24/2014 at 800a  . haloperidol (HALDOL) 0.5 MG tablet Take 0.5 mg by mouth 2 (two) times daily.    12/24/2014 at 900a  . levothyroxine (SYNTHROID, LEVOTHROID) 88 MCG tablet Take 1 tablet (88 mcg total) by mouth daily before breakfast. 30 tablet 12  12/24/2014 at 600a  . loperamide (IMODIUM) 2 MG capsule Take 1 capsule (2 mg total) by mouth as needed for diarrhea or loose stools. 30 capsule 0 unknown  . loratadine (CLARITIN) 10 MG tablet Take 10 mg by mouth daily.   12/24/2014 at [place]  . nitroGLYCERIN (NITROSTAT) 0.4 MG SL tablet Place 1 tablet (0.4 mg total) under the tongue every 5 (five) minutes as needed for chest pain. 25 tablet 12 unknown  . pantoprazole (PROTONIX) 40 MG tablet Take 1 tablet (40 mg total) by mouth daily. 30 tablet 12 12/24/2014 at 600a  . polyethylene glycol (MIRALAX / GLYCOLAX) packet Take 17 g by mouth daily.   12/24/2014 at 900a  . potassium chloride SA (K-DUR,KLOR-CON) 20 MEQ tablet Take 10 mEq by mouth daily.    12/24/2014 at [place]  . predniSONE (DELTASONE) 5 MG tablet Take 5 mg by mouth daily with breakfast.   12/24/2014 at [place]  . ranolazine (RANEXA) 500 MG 12 hr tablet Take 1 tablet (500 mg total) by mouth 2 (two) times daily. 60 tablet 3 12/24/2014 at [place]  . saccharomyces boulardii (FLORASTOR) 250 MG capsule Take 250 mg by mouth 2 (two) times daily.   12/24/2014 at 800a  . sertraline (ZOLOFT) 100 MG tablet Take 1 tablet (100 mg total) by mouth daily.   12/24/2014 at 900a  . temazepam (RESTORIL) 7.5 MG capsule Take 7.5 mg by mouth at bedtime as needed for sleep.   unknown  . traMADol (ULTRAM) 50 MG tablet Take 50 mg by mouth every 6 (  six) hours as needed for moderate pain.   unknown  . trolamine salicylate (ASPERCREME) 10 % cream Apply topically as needed for muscle pain. (Patient not taking: Reported on 12/24/2014) 85 g 0 Taking  . [DISCONTINUED] furosemide (LASIX) 20 MG tablet Take 20 mg by mouth daily.   Taking   Scheduled: . albuterol  2.5 mg Nebulization QID  . antiseptic oral rinse  7 mL Mouth Rinse QID  . chlorhexidine  15 mL Mouth Rinse BID  . enoxaparin (LOVENOX) injection  40 mg Subcutaneous Q24H  . furosemide  40 mg Intravenous Daily  . haloperidol lactate  1 mg Intravenous Q12H  . insulin aspart  0-20 Units  Subcutaneous 6 times per day  . methylPREDNISolone (SOLU-MEDROL) injection  40 mg Intravenous Q6H  . pantoprazole (PROTONIX) IV  40 mg Intravenous Q24H  . piperacillin-tazobactam (ZOSYN)  IV  3.375 g Intravenous Q8H  . sodium chloride  10-40 mL Intracatheter Q12H  . vancomycin  1,250 mg Intravenous Q24H   Continuous: . sodium chloride 100 mL/hr at 01/07/15 0501  . propofol 10 mcg/kg/min (01/07/15 0430)   HWY:[id number], albuterol, fentaNYL, LORazepam, ondansetron (ZOFRAN) IV, sodium chloride  Assesment: She has acute on chronic respiratory failure and this is her second episode of ventilator dependent respiratory failure this admission. She has a positive blood culture but it's only one of 2. Her urine culture is growing greater than 100,000 colonies we don't have an identification yet. She generally seems better. She does have malnutrition and if she's not able to be extubated today will need to be on tube feedings Active Problems:   Morbid obesity   Cerebral palsy   Hypertension   Sepsis   Acute and chronic respiratory failure with hypercapnia   Encephalopathy, metabolic   UTI (urinary tract infection)    Plan: Attempt weaning and extubation. If she's not able to do this she will need tube feedings.    LOS: 14 days   Eliyah Bazzi L 01/07/2015, 8:07 AM

## 2015-01-08 ENCOUNTER — Inpatient Hospital Stay (HOSPITAL_COMMUNITY): Payer: Medicare Other

## 2015-01-08 DIAGNOSIS — I2510 Atherosclerotic heart disease of native coronary artery without angina pectoris: Secondary | ICD-10-CM | POA: Diagnosis present

## 2015-01-08 DIAGNOSIS — J449 Chronic obstructive pulmonary disease, unspecified: Secondary | ICD-10-CM | POA: Diagnosis present

## 2015-01-08 DIAGNOSIS — E43 Unspecified severe protein-calorie malnutrition: Secondary | ICD-10-CM | POA: Diagnosis present

## 2015-01-08 DIAGNOSIS — E876 Hypokalemia: Secondary | ICD-10-CM | POA: Diagnosis not present

## 2015-01-08 DIAGNOSIS — B3749 Other urogenital candidiasis: Secondary | ICD-10-CM | POA: Diagnosis not present

## 2015-01-08 LAB — CBC WITH DIFFERENTIAL/PLATELET
Basophils Absolute: 0 10*3/uL (ref 0.0–0.1)
Basophils Relative: 0 % (ref 0–1)
EOS PCT: 0 % (ref 0–5)
Eosinophils Absolute: 0 10*3/uL (ref 0.0–0.7)
HCT: 36.1 % (ref 36.0–46.0)
Hemoglobin: 10.7 g/dL — ABNORMAL LOW (ref 12.0–15.0)
LYMPHS ABS: 0.6 10*3/uL — AB (ref 0.7–4.0)
Lymphocytes Relative: 13 % (ref 12–46)
MCH: 27.2 pg (ref 26.0–34.0)
MCHC: 29.6 g/dL — AB (ref 30.0–36.0)
MCV: 91.6 fL (ref 78.0–100.0)
Monocytes Absolute: 0.1 10*3/uL (ref 0.1–1.0)
Monocytes Relative: 2 % — ABNORMAL LOW (ref 3–12)
Neutro Abs: 3.8 10*3/uL (ref 1.7–7.7)
Neutrophils Relative %: 85 % — ABNORMAL HIGH (ref 43–77)
PLATELETS: 122 10*3/uL — AB (ref 150–400)
RBC: 3.94 MIL/uL (ref 3.87–5.11)
RDW: 15.8 % — AB (ref 11.5–15.5)
WBC: 4.4 10*3/uL (ref 4.0–10.5)

## 2015-01-08 LAB — BASIC METABOLIC PANEL
ANION GAP: 8 (ref 5–15)
BUN: 22 mg/dL (ref 6–23)
CO2: 33 mmol/L — ABNORMAL HIGH (ref 19–32)
Calcium: 8.4 mg/dL (ref 8.4–10.5)
Chloride: 102 mEq/L (ref 96–112)
Creatinine, Ser: 0.31 mg/dL — ABNORMAL LOW (ref 0.50–1.10)
GFR calc Af Amer: >90 mL/min (ref >90)
GFR calc non Af Amer: >90 mL/min (ref >90)
Glucose, Bld: 141 mg/dL — ABNORMAL HIGH (ref 70–99)
POTASSIUM: 2.6 mmol/L — AB (ref 3.5–5.1)
SODIUM: 143 mmol/L (ref 135–145)

## 2015-01-08 LAB — TRIGLYCERIDES: TRIGLYCERIDES: 140 mg/dL (ref <150)

## 2015-01-08 LAB — GLUCOSE, CAPILLARY
GLUCOSE-CAPILLARY: 130 mg/dL — AB (ref 70–99)
GLUCOSE-CAPILLARY: 154 mg/dL — AB (ref 70–99)
Glucose-Capillary: 142 mg/dL — ABNORMAL HIGH (ref 70–99)
Glucose-Capillary: 147 mg/dL — ABNORMAL HIGH (ref 70–99)

## 2015-01-08 LAB — CLOSTRIDIUM DIFFICILE BY PCR

## 2015-01-08 MED ORDER — CETYLPYRIDINIUM CHLORIDE 0.05 % MT LIQD
7.0000 mL | Freq: Two times a day (BID) | OROMUCOSAL | Status: DC
  Administered 2015-01-08 – 2015-01-10 (×5): 7 mL via OROMUCOSAL

## 2015-01-08 MED ORDER — POTASSIUM CHLORIDE CRYS ER 20 MEQ PO TBCR
40.0000 meq | EXTENDED_RELEASE_TABLET | ORAL | Status: AC
  Administered 2015-01-08 (×2): 40 meq via ORAL
  Filled 2015-01-08 (×2): qty 2

## 2015-01-08 NOTE — Progress Notes (Signed)
Potassium is 2.6 this morning. MD notified

## 2015-01-08 NOTE — Progress Notes (Signed)
Subjective: She was able to be extubated yesterday. She is calm and responsive. She is now on nasal cannula with good oxygenation. Her urine is growing greater than 100,000 yeast. She is now on Diflucan. The positive blood culture is  for coagulase-negative staph. Objective: Vital signs in last 24 hours: Temp:  [97.4 F (36.3 C)-99.8 F (37.7 C)] 97.4 F (36.3 C) (01/20 0400) Pulse Rate:  [35-212] 44 (01/20 0500) Resp:  [11-30] 15 (01/20 0500) BP: (83-122)/(33-106) 115/59 mmHg (01/20 0500) SpO2:  [71 %-100 %] 97 % (01/20 0500) FiO2 (%):  [40 %] 40 % (01/19 1509) Weight:  [88.9 kg (195 lb 15.8 oz)] 88.9 kg (195 lb 15.8 oz) (01/20 0500) Weight change: 1.3 kg (2 lb 13.9 oz) Last BM Date: 01/07/15  Intake/Output from previous day: 01/19 0701 - 01/20 0700 In: 2698.3 [I.V.:2198.3; IV Piggyback:500] Out: 2550 [Urine:2550]  PHYSICAL EXAM General appearance: alert, cooperative and She still has some choreoathetoid movements when she is awake Resp: rhonchi bilaterally Cardio: regular rate and rhythm, S1, S2 normal, no murmur, click, rub or gallop GI: soft, non-tender; bowel sounds normal; no masses,  no organomegaly Extremities: extremities normal, atraumatic, no cyanosis or edema  Lab Results:  Results for orders placed or performed during the hospital encounter of 12/24/14 (from the past 48 hour(s))  Glucose, capillary     Status: Abnormal   Collection Time: 01/06/15  8:08 AM  Result Value Ref Range   Glucose-Capillary 129 (H) 70 - 99 mg/dL   Comment 1 Notify RN   Glucose, capillary     Status: Abnormal   Collection Time: 01/06/15 11:51 AM  Result Value Ref Range   Glucose-Capillary 118 (H) 70 - 99 mg/dL   Comment 1 Notify RN   Glucose, capillary     Status: Abnormal   Collection Time: 01/06/15  4:46 PM  Result Value Ref Range   Glucose-Capillary 125 (H) 70 - 99 mg/dL   Comment 1 Notify RN   Glucose, capillary     Status: Abnormal   Collection Time: 01/06/15  7:59 PM  Result  Value Ref Range   Glucose-Capillary 113 (H) 70 - 99 mg/dL   Comment 1 Documented in Chart   Glucose, capillary     Status: Abnormal   Collection Time: 01/07/15 12:31 AM  Result Value Ref Range   Glucose-Capillary 113 (H) 70 - 99 mg/dL   Comment 1 Notify RN   Glucose, capillary     Status: Abnormal   Collection Time: 01/07/15  3:50 AM  Result Value Ref Range   Glucose-Capillary 105 (H) 70 - 99 mg/dL   Comment 1 Notify RN   Creatinine, serum     Status: Abnormal   Collection Time: 01/07/15  5:41 AM  Result Value Ref Range   Creatinine, Ser 0.38 ([name]) 0.50 - 1.10 mg/dL   GFR calc non Af Amer >90 >90 mL/min   GFR calc Af Amer >90 >90 mL/min    Comment: (NOTE) The eGFR has been calculated using the CKD EPI equation. This calculation has not been validated in all clinical situations. eGFR's persistently <90 mL/min signify possible Chronic Kidney Disease.   Triglycerides     Status: Abnormal   Collection Time: 01/07/15  5:41 AM  Result Value Ref Range   Triglycerides 175 (H) <150 mg/dL  Glucose, capillary     Status: Abnormal   Collection Time: 01/07/15  7:30 AM  Result Value Ref Range   Glucose-Capillary 109 (H) 70 - 99 mg/dL   Comment   1 Notify RN   Glucose, capillary     Status: Abnormal   Collection Time: 01/07/15 11:22 AM  Result Value Ref Range   Glucose-Capillary 124 (H) 70 - 99 mg/dL   Comment 1 Notify RN   Blood gas, arterial     Status: Abnormal   Collection Time: 01/07/15  3:06 PM  Result Value Ref Range   FIO2 40.00 %   Delivery systems VENTILATOR    Mode CONTINUOUS POSITIVE AIRWAY PRESSURE    Peep/cpap 5.0 cm H20   Pressure support 5.0 cm H20   pH, Arterial 7.513 (H) 7.350 - 7.450   pCO2 arterial 42.6 35.0 - 45.0 mmHg   pO2, Arterial 112.0 (H) 80.0 - 100.0 mmHg   Bicarbonate 34.0 (H) 20.0 - 24.0 mEq/[name]   TCO2 30.1 0 - 100 mmol/[name]   Acid-Base Excess 10.3 (H) 0.0 - 2.0 mmol/[name]   O2 Saturation 98.2 %   Patient temperature 37.0    Collection site LEFT RADIAL     Drawn by COLLECTED BY RT    Sample type ARTERIAL    Allens test (pass/fail) PASS PASS  Glucose, capillary     Status: Abnormal   Collection Time: 01/07/15  4:05 PM  Result Value Ref Range   Glucose-Capillary 109 (H) 70 - 99 mg/dL   Comment 1 Notify RN   Glucose, capillary     Status: Abnormal   Collection Time: 01/07/15  9:22 PM  Result Value Ref Range   Glucose-Capillary 123 (H) 70 - 99 mg/dL   Comment 1 Notify RN   Basic metabolic panel     Status: Abnormal   Collection Time: 01/08/15  4:32 AM  Result Value Ref Range   Sodium 143 135 - 145 mmol/[name]    Comment: Please note change in reference range.   Potassium 2.6 (LL) 3.5 - 5.1 mmol/[name]    Comment: CRITICAL RESULT CALLED TO, READ BACK BY AND VERIFIED WITH: WAGONER,R AT 01/08/15 BY FESTERMAN,C Please note change in reference range.    Chloride 102 96 - 112 mEq/[name]   CO2 33 (H) 19 - 32 mmol/[name]   Glucose, Bld 141 (H) 70 - 99 mg/dL   BUN 22 6 - 23 mg/dL   Creatinine, Ser 0.31 ([name]) 0.50 - 1.10 mg/dL   Calcium 8.4 8.4 - 10.5 mg/dL   GFR calc non Af Amer >90 >90 mL/min   GFR calc Af Amer >90 >90 mL/min    Comment: (NOTE) The eGFR has been calculated using the CKD EPI equation. This calculation has not been validated in all clinical situations. eGFR's persistently <90 mL/min signify possible Chronic Kidney Disease.    Anion gap 8 5 - 15  CBC with Differential     Status: Abnormal   Collection Time: 01/08/15  4:32 AM  Result Value Ref Range   WBC 4.4 4.0 - 10.5 K/uL   RBC 3.94 3.87 - 5.11 MIL/uL   Hemoglobin 10.7 ([name]) 12.0 - 15.0 g/dL   HCT 36.1 36.0 - 46.0 %   MCV 91.6 78.0 - 100.0 fL   MCH 27.2 26.0 - 34.0 pg   MCHC 29.6 ([name]) 30.0 - 36.0 g/dL   RDW 15.8 (H) 11.5 - 15.5 %   Platelets 122 ([name]) 150 - 400 K/uL   Neutrophils Relative % 85 (H) 43 - 77 %   Neutro Abs 3.8 1.7 - 7.7 K/uL   Lymphocytes Relative 13 12 - 46 %   Lymphs Abs 0.6 ([name]) 0.7 - 4.0 K/uL   Monocytes   Relative 2 ([name]) 3 - 12 %   Monocytes Absolute 0.1 0.1 - 1.0 K/uL    Eosinophils Relative 0 0 - 5 %   Eosinophils Absolute 0.0 0.0 - 0.7 K/uL   Basophils Relative 0 0 - 1 %   Basophils Absolute 0.0 0.0 - 0.1 K/uL  Triglycerides     Status: None   Collection Time: 01/08/15  4:32 AM  Result Value Ref Range   Triglycerides 140 <150 mg/dL    ABGS  Recent Labs  01/07/15 1506  PHART 7.513*  PO2ART 112.0*  TCO2 30.1  HCO3 34.0*   CULTURES Recent Results (from the past 240 hour(s))  Culture, blood (routine x 2)     Status: None   Collection Time: 01/05/15 10:16 AM  Result Value Ref Range Status   Specimen Description LEFT ANTECUBITAL  Final   Special Requests BOTTLES DRAWN AEROBIC AND ANAEROBIC  Final   Culture   Final    STAPHYLOCOCCUS SPECIES (COAGULASE NEGATIVE) Note: THE SIGNIFICANCE OF ISOLATING THIS ORGANISM FROM A SINGLE VENIPUNCTURE CANNOT BE PREDICTED WITHOUT FURTHER CLINICAL AND CULTURE CORRELATION. SUSCEPTIBILITIES AVAILABLE ONLY ON REQUEST. Note: Gram Stain Report Called to,Read Back By and Verified With: KEITH A 11/04/15 0625 BY THOMPSON S Performed at [facility] Performed at [facility]    Report Status 01/07/2015 FINAL  Final  Culture, blood (routine x 2)     Status: None (Preliminary result)   Collection Time: 01/05/15 10:27 AM  Result Value Ref Range Status   Specimen Description PORTA CATH  Final   Special Requests BOTTLES DRAWN AEROBIC AND ANAEROBIC  Final   Culture NO GROWTH 2 DAYS  Final   Report Status PENDING  Incomplete  Urine culture     Status: None   Collection Time: 01/05/15 11:19 AM  Result Value Ref Range Status   Specimen Description URINE, CLEAN CATCH  Final   Special Requests Normal  Final   Colony Count   Final    >=100,000 COLONIES/ML Performed at [facility]    Culture YEAST Performed at [facility]   Final   Report Status 01/06/2015 FINAL  Final   Studies/Results: No results found.  Medications:  Prior to Admission:  Prescriptions prior to admission   Medication Sig Dispense Refill Last Dose  . acetaminophen (TYLENOL) 325 MG tablet Take 2 tablets (650 mg total) by mouth every 6 (six) hours as needed for mild pain (or Fever >/= 101).   unknown  . albuterol (PROVENTIL HFA;VENTOLIN HFA) 108 (90 BASE) MCG/ACT inhaler Inhale 2 puffs into the lungs every 4 (four) hours as needed. Shortness of breath 1 Inhaler 12 unknown  . albuterol (PROVENTIL) (2.5 MG/3ML) 0.083% nebulizer solution Take 2.5 mg by nebulization 4 (four) times daily.   12/24/2014 at 1200  . ALPRAZolam (XANAX) 1 MG tablet Take 1 mg by mouth 4 (four) times daily.    12/24/2014 at 1400  . Amino Acids-Protein Hydrolys (FEEDING SUPPLEMENT, PRO-STAT SUGAR FREE 64,) LIQD Take 30 mLs by mouth 2 (two) times daily with a meal.   12/24/2014 at 800a  . aspirin 325 MG tablet Take 1 tablet (325 mg total) by mouth daily. 30 tablet 12 12/24/2014 at 900a  . atorvastatin (LIPITOR) 40 MG tablet Take 40 mg by mouth daily.   12/24/2014 at 1000a  . baclofen (LIORESAL) 10 MG tablet Take 5 mg by mouth 3 (three) times daily.   12/24/2014 at 1400  . cefTRIAXone (ROCEPHIN) 1 G injection Inject 1 g into   the muscle once. Last administered on 12/23/2014 at 22:00 IM to right hip   12/23/2014  . cholecalciferol (VITAMIN D) 1000 UNITS tablet Take 1,000 Units by mouth daily.   12/24/2014 at 900a  . clopidogrel (PLAVIX) 75 MG tablet Take 1 tablet (75 mg total) by mouth daily with breakfast.   12/24/2014 at 900a  . Cranberry 450 MG CAPS Take 1 capsule by mouth daily.   12/24/2014 at 900a  . guaiFENesin (MUCINEX) 600 MG 12 hr tablet Take 600 mg by mouth 2 (two) times daily.   12/24/2014 at 800a  . haloperidol (HALDOL) 0.5 MG tablet Take 0.5 mg by mouth 2 (two) times daily.    12/24/2014 at 900a  . levothyroxine (SYNTHROID, LEVOTHROID) 88 MCG tablet Take 1 tablet (88 mcg total) by mouth daily before breakfast. 30 tablet 12 12/24/2014 at 600a  . loperamide (IMODIUM) 2 MG capsule Take 1 capsule (2 mg total) by mouth as needed for diarrhea or loose  stools. 30 capsule 0 unknown  . loratadine (CLARITIN) 10 MG tablet Take 10 mg by mouth daily.   12/24/2014 at 900a  . nitroGLYCERIN (NITROSTAT) 0.4 MG SL tablet Place 1 tablet (0.4 mg total) under the tongue every 5 (five) minutes as needed for chest pain. 25 tablet 12 unknown  . pantoprazole (PROTONIX) 40 MG tablet Take 1 tablet (40 mg total) by mouth daily. 30 tablet 12 12/24/2014 at 600a  . polyethylene glycol (MIRALAX / GLYCOLAX) packet Take 17 g by mouth daily.   12/24/2014 at 900a  . potassium chloride SA (K-DUR,KLOR-CON) 20 MEQ tablet Take 10 mEq by mouth daily.    12/24/2014 at 900a  . predniSONE (DELTASONE) 5 MG tablet Take 5 mg by mouth daily with breakfast.   12/24/2014 at 900a  . ranolazine (RANEXA) 500 MG 12 hr tablet Take 1 tablet (500 mg total) by mouth 2 (two) times daily. 60 tablet 3 12/24/2014 at 1000a  . saccharomyces boulardii (FLORASTOR) 250 MG capsule Take 250 mg by mouth 2 (two) times daily.   12/24/2014 at 800a  . sertraline (ZOLOFT) 100 MG tablet Take 1 tablet (100 mg total) by mouth daily.   12/24/2014 at 900a  . temazepam (RESTORIL) 7.5 MG capsule Take 7.5 mg by mouth at bedtime as needed for sleep.   unknown  . traMADol (ULTRAM) 50 MG tablet Take 50 mg by mouth every 6 (six) hours as needed for moderate pain.   unknown  . trolamine salicylate (ASPERCREME) 10 % cream Apply topically as needed for muscle pain. (Patient not taking: Reported on 12/24/2014) 85 g 0 Taking  . [DISCONTINUED] furosemide (LASIX) 20 MG tablet Take 20 mg by mouth daily.   Taking   Scheduled: . albuterol  2.5 mg Nebulization QID  . antiseptic oral rinse  7 mL Mouth Rinse BID  . enoxaparin (LOVENOX) injection  40 mg Subcutaneous Q24H  . fluconazole (DIFLUCAN) IV  100 mg Intravenous Q24H  . furosemide  40 mg Intravenous Daily  . haloperidol lactate  1 mg Intravenous Q12H  . insulin aspart  0-20 Units Subcutaneous TID WC  . methylPREDNISolone (SOLU-MEDROL) injection  40 mg Intravenous Q6H  . pantoprazole (PROTONIX)  IV  40 mg Intravenous Q24H  . piperacillin-tazobactam (ZOSYN)  IV  3.375 g Intravenous Q8H  . potassium chloride  40 mEq Oral Q4H  . sodium chloride  10-40 mL Intracatheter Q12H  . vancomycin  1,250 mg Intravenous Q24H   Continuous: . sodium chloride 100 mL/hr at 01/08/15 0300   PRN:acetaminophen, albuterol,   fentaNYL, LORazepam, ondansetron (ZOFRAN) IV, sodium chloride  Assesment: She was admitted with acute on chronic respiratory failure. She was septic. It was thought at the time that she had a urinary tract infection but her original urine culture was negative. She had some problem with volume overload and that has improved. This was from a combination of fluid resuscitation from sepsis and protein calorie malnutrition. At baseline she has COPD and has cerebral palsy. She has cardiac disease with a previous myocardial infarction. She was severely encephalopathic on admission but that has improved. She was being readied for transfer to skilled care facility but had low-grade fever. Blood cultures and urine culture were done which are discussed above. She then developed acute respiratory distress and was transferred to the ICU and was subsequently intubated and placed on mechanical ventilation again. She was able to be extubated yesterday. Active Problems:   Morbid obesity   Cerebral palsy   Hypertension   Sepsis   Acute and chronic respiratory failure with hypercapnia   Encephalopathy, metabolic   UTI (urinary tract infection)   Protein-calorie malnutrition, severe    Plan: I think she can be transferred from ICU. She is hypokalemic and needs replacement. She needs nutritional support.    LOS: 15 days   [name] 01/08/2015, 7:41 AM

## 2015-01-09 LAB — GLUCOSE, CAPILLARY
GLUCOSE-CAPILLARY: 132 mg/dL — AB (ref 70–99)
GLUCOSE-CAPILLARY: 125 mg/dL — AB (ref 70–99)
GLUCOSE-CAPILLARY: 126 mg/dL — AB (ref 70–99)
GLUCOSE-CAPILLARY: 146 mg/dL — AB (ref 70–99)
Glucose-Capillary: 176 mg/dL — ABNORMAL HIGH (ref 70–99)

## 2015-01-09 LAB — BASIC METABOLIC PANEL
ANION GAP: 7 (ref 5–15)
BUN: 15 mg/dL (ref 6–23)
CO2: 31 mmol/L (ref 19–32)
CREATININE: 0.36 mg/dL — AB (ref 0.50–1.10)
Calcium: 7.7 mg/dL — ABNORMAL LOW (ref 8.4–10.5)
Chloride: 101 mEq/L (ref 96–112)
GFR calc Af Amer: >90 mL/min (ref >90)
GFR calc non Af Amer: >90 mL/min (ref >90)
Glucose, Bld: 127 mg/dL — ABNORMAL HIGH (ref 70–99)
POTASSIUM: 2.6 mmol/L — AB (ref 3.5–5.1)
SODIUM: 139 mmol/L (ref 135–145)

## 2015-01-09 MED ORDER — METRONIDAZOLE 500 MG PO TABS
500.0000 mg | ORAL_TABLET | Freq: Three times a day (TID) | ORAL | Status: DC
  Administered 2015-01-09 – 2015-01-10 (×4): 500 mg via ORAL
  Filled 2015-01-09 (×4): qty 1

## 2015-01-09 MED ORDER — GLUCERNA SHAKE PO LIQD
237.0000 mL | Freq: Two times a day (BID) | ORAL | Status: DC
  Administered 2015-01-09 – 2015-01-10 (×3): 237 mL via ORAL

## 2015-01-09 MED ORDER — POTASSIUM CHLORIDE CRYS ER 20 MEQ PO TBCR
40.0000 meq | EXTENDED_RELEASE_TABLET | Freq: Four times a day (QID) | ORAL | Status: DC
  Administered 2015-01-09 – 2015-01-10 (×5): 40 meq via ORAL
  Filled 2015-01-09 (×5): qty 2

## 2015-01-09 NOTE — Progress Notes (Signed)
CRITICAL VALUE ALERT  Critical value received:  Potassium 2.6  Date of notification:  01/09/15  Time of notification:  818am  Critical value read back:: by night nurse.  Nurse who received alert: Night shift nurse  MD notified (1st page): Luan Pulling  Time of first page:  0818am  MD notified (2nd page):  Time of second page:  Responding MD: Dr Luan Pulling  Time MD responded:  [id number] in person

## 2015-01-09 NOTE — Progress Notes (Signed)
Subjective: She says she feels okay. She still mildly confused. No new complaints but she has become positive for C. difficile  Objective: Vital signs in last 24 hours: Temp:  [98.1 F (36.7 C)-98.5 F (36.9 C)] 98.2 F (36.8 C) (01/21 8768) Pulse Rate:  [69-75] 69 (01/21 0633) Resp:  [20-24] 22 (01/21 0633) BP: (97-124)/(51-84) 124/84 mmHg (01/21 0633) SpO2:  [94 %-97 %] 97 % (01/21 0712) Weight:  [85.8 kg (189 lb 2.5 oz)] 85.8 kg (189 lb 2.5 oz) (01/21 1157) Weight change: -3.1 kg (-6 lb 13.4 oz) Last BM Date: 01/08/15  Intake/Output from previous day: 01/20 0701 - 01/21 0700 In: 240 [P.O.:240] Out: 2250 [Urine:2250]  PHYSICAL EXAM General appearance: alert, cooperative, mild distress and morbidly obese Resp: rhonchi bilaterally Cardio: regular rate and rhythm, S1, S2 normal, no murmur, click, rub or gallop GI: soft, non-tender; bowel sounds normal; no masses,  no organomegaly Extremities: extremities normal, atraumatic, no cyanosis or edema  Lab Results:  Results for orders placed or performed during the hospital encounter of 12/24/14 (from the past 48 hour(s))  Glucose, capillary     Status: Abnormal   Collection Time: 01/07/15 11:22 AM  Result Value Ref Range   Glucose-Capillary 124 (H) 70 - 99 mg/dL   Comment 1 Notify RN   Blood gas, arterial     Status: Abnormal   Collection Time: 01/07/15  3:06 PM  Result Value Ref Range   FIO2 40.00 %   Delivery systems VENTILATOR    Mode CONTINUOUS POSITIVE AIRWAY PRESSURE    Peep/cpap 5.0 cm H20   Pressure support 5.0 cm H20   pH, Arterial 7.513 (H) 7.350 - 7.450   pCO2 arterial 42.6 35.0 - 45.0 mmHg   pO2, Arterial 112.0 (H) 80.0 - 100.0 mmHg   Bicarbonate 34.0 (H) 20.0 - 24.0 mEq/L   TCO2 30.1 0 - 100 mmol/L   Acid-Base Excess 10.3 (H) 0.0 - 2.0 mmol/L   O2 Saturation 98.2 %   Patient temperature 37.0    Collection site LEFT RADIAL    Drawn by COLLECTED BY RT    Sample type ARTERIAL    Allens test (pass/fail) PASS  PASS  Glucose, capillary     Status: Abnormal   Collection Time: 01/07/15  4:05 PM  Result Value Ref Range   Glucose-Capillary 109 (H) 70 - 99 mg/dL   Comment 1 Notify RN   Glucose, capillary     Status: Abnormal   Collection Time: 01/07/15  9:22 PM  Result Value Ref Range   Glucose-Capillary 123 (H) 70 - 99 mg/dL   Comment 1 Notify RN   Basic metabolic panel     Status: Abnormal   Collection Time: 01/08/15  4:32 AM  Result Value Ref Range   Sodium 143 135 - 145 mmol/L    Comment: Please note change in reference range.   Potassium 2.6 (LL) 3.5 - 5.1 mmol/L    Comment: CRITICAL RESULT CALLED TO, READ BACK BY AND VERIFIED WITH: Lynwood Dawley AT 01/08/15 BY FESTERMAN,C Please note change in reference range.    Chloride 102 96 - 112 mEq/L   CO2 33 (H) 19 - 32 mmol/L   Glucose, Bld 141 (H) 70 - 99 mg/dL   BUN 22 6 - 23 mg/dL   Creatinine, Ser 0.31 (L) 0.50 - 1.10 mg/dL   Calcium 8.4 8.4 - 10.5 mg/dL   GFR calc non Af Amer >90 >90 mL/min   GFR calc Af Amer >90 >90 mL/min  Comment: (NOTE) The eGFR has been calculated using the CKD EPI equation. This calculation has not been validated in all clinical situations. eGFR's persistently <90 mL/min signify possible Chronic Kidney Disease.    Anion gap 8 5 - 15  CBC with Differential     Status: Abnormal   Collection Time: 01/08/15  4:32 AM  Result Value Ref Range   WBC 4.4 4.0 - 10.5 K/uL   RBC 3.94 3.87 - 5.11 MIL/uL   Hemoglobin 10.7 (L) 12.0 - 15.0 g/dL   HCT 36.1 36.0 - 46.0 %   MCV 91.6 78.0 - 100.0 fL   MCH 27.2 26.0 - 34.0 pg   MCHC 29.6 (L) 30.0 - 36.0 g/dL   RDW 15.8 (H) 11.5 - 15.5 %   Platelets 122 (L) 150 - 400 K/uL   Neutrophils Relative % 85 (H) 43 - 77 %   Neutro Abs 3.8 1.7 - 7.7 K/uL   Lymphocytes Relative 13 12 - 46 %   Lymphs Abs 0.6 (L) 0.7 - 4.0 K/uL   Monocytes Relative 2 (L) 3 - 12 %   Monocytes Absolute 0.1 0.1 - 1.0 K/uL   Eosinophils Relative 0 0 - 5 %   Eosinophils Absolute 0.0 0.0 - 0.7 K/uL    Basophils Relative 0 0 - 1 %   Basophils Absolute 0.0 0.0 - 0.1 K/uL  Triglycerides     Status: None   Collection Time: 01/08/15  4:32 AM  Result Value Ref Range   Triglycerides 140 <150 mg/dL  Glucose, capillary     Status: Abnormal   Collection Time: 01/08/15  7:57 AM  Result Value Ref Range   Glucose-Capillary 130 (H) 70 - 99 mg/dL   Comment 1 Documented in Chart    Comment 2 Notify RN   Glucose, capillary     Status: Abnormal   Collection Time: 01/08/15 11:28 AM  Result Value Ref Range   Glucose-Capillary 142 (H) 70 - 99 mg/dL  Glucose, capillary     Status: Abnormal   Collection Time: 01/08/15  5:19 PM  Result Value Ref Range   Glucose-Capillary 154 (H) 70 - 99 mg/dL   Comment 1 Notify RN    Comment 2 Documented in Chart   Clostridium Difficile by PCR     Status: Abnormal   Collection Time: 01/08/15  9:00 PM  Result Value Ref Range   C difficile by pcr (A) NEGATIVE    CRITICAL RESULT CALLED TO, READ BACK BY AND VERIFIED WITH:    Comment: STURDIVANT,D AT 2330 ON 01/08/2015 BY ISLEY,B  Glucose, capillary     Status: Abnormal   Collection Time: 01/08/15  9:22 PM  Result Value Ref Range   Glucose-Capillary 147 (H) 70 - 99 mg/dL   Comment 1 Notify RN    Comment 2 Documented in Chart   Basic metabolic panel     Status: Abnormal   Collection Time: 01/09/15  6:13 AM  Result Value Ref Range   Sodium 139 135 - 145 mmol/L    Comment: Please note change in reference range.   Potassium 2.6 (LL) 3.5 - 5.1 mmol/L    Comment: CRITICAL RESULT CALLED TO, READ BACK BY AND VERIFIED WITH: STURDIVANT,K AT 7:10AM ON 01/09/15 BY FESTERMAN,C Please note change in reference range.    Chloride 101 96 - 112 mEq/L   CO2 31 19 - 32 mmol/L   Glucose, Bld 127 (H) 70 - 99 mg/dL   BUN 15 6 - 23 mg/dL   Creatinine,  Ser 0.36 (L) 0.50 - 1.10 mg/dL   Calcium 7.7 (L) 8.4 - 10.5 mg/dL   GFR calc non Af Amer >90 >90 mL/min   GFR calc Af Amer >90 >90 mL/min    Comment: (NOTE) The eGFR has been  calculated using the CKD EPI equation. This calculation has not been validated in all clinical situations. eGFR's persistently <90 mL/min signify possible Chronic Kidney Disease.    Anion gap 7 5 - 15  Glucose, capillary     Status: Abnormal   Collection Time: 01/09/15  7:27 AM  Result Value Ref Range   Glucose-Capillary 132 (H) 70 - 99 mg/dL   Comment 1 Documented in Chart    Comment 2 Notify RN     ABGS  Recent Labs  01/07/15 1506  PHART 7.513*  PO2ART 112.0*  TCO2 30.1  HCO3 34.0*   CULTURES Recent Results (from the past 240 hour(s))  Culture, blood (routine x 2)     Status: None   Collection Time: 01/05/15 10:16 AM  Result Value Ref Range Status   Specimen Description LEFT ANTECUBITAL  Final   Special Requests BOTTLES DRAWN AEROBIC AND ANAEROBIC  Final   Culture   Final    STAPHYLOCOCCUS SPECIES (COAGULASE NEGATIVE) Note: THE SIGNIFICANCE OF ISOLATING THIS ORGANISM FROM A SINGLE VENIPUNCTURE CANNOT BE PREDICTED WITHOUT FURTHER CLINICAL AND CULTURE CORRELATION. SUSCEPTIBILITIES AVAILABLE ONLY ON REQUEST. Note: Gram Stain Report Called to,Read Back By and Verified With: KEITH A 11/04/15 0625 BY THOMPSON S Performed at [facility] Performed at [facility]    Report Status 01/07/2015 FINAL  Final  Culture, blood (routine x 2)     Status: None (Preliminary result)   Collection Time: 01/05/15 10:27 AM  Result Value Ref Range Status   Specimen Description BLOOD PORTA CATH DRAWN BY RN  Final   Special Requests BOTTLES DRAWN AEROBIC AND ANAEROBIC 6CC  Final   Culture NO GROWTH 3 DAYS  Final   Report Status PENDING  Incomplete  Urine culture     Status: None   Collection Time: 01/05/15 11:19 AM  Result Value Ref Range Status   Specimen Description URINE, CLEAN CATCH  Final   Special Requests Normal  Final   Colony Count   Final    >=100,000 COLONIES/ML Performed at [place] Performed at [facility]   Final    Report Status 01/06/2015 FINAL  Final  Clostridium Difficile by PCR     Status: Abnormal   Collection Time: 01/08/15  9:00 PM  Result Value Ref Range Status   C difficile by pcr (A) NEGATIVE Final    CRITICAL RESULT CALLED TO, READ BACK BY AND VERIFIED WITH:    Comment: STURDIVANT,D AT 2330 ON 01/08/2015 BY ISLEY,B   Studies/Results: Dg Chest Port 1 View  01/08/2015   CLINICAL DATA:  Short of breath  EXAM: PORTABLE CHEST - 1 VIEW  COMPARISON:  01/05/2015  FINDINGS: Stable cardiomegaly. Endotracheal and NG tubes removed. Stable right PICC. Stable bibasilar atelectasis versus airspace disease. Stable vascular congestion.  IMPRESSION: Extubated.  Stable bibasilar atelectasis versus airspace disease and vascular congestion.   Electronically Signed   By: Maryclare Bean M.D.   On: 01/08/2015 07:44    Medications:  Prior to Admission:  Prescriptions prior to admission  Medication Sig Dispense Refill Last Dose  . acetaminophen (TYLENOL) 325 MG tablet Take 2 tablets (650 mg total) by mouth every 6 (six) hours as needed for mild  pain (or Fever >/= 101).   unknown  . albuterol (PROVENTIL HFA;VENTOLIN HFA) 108 (90 BASE) MCG/ACT inhaler Inhale 2 puffs into the lungs every 4 (four) hours as needed. Shortness of breath 1 Inhaler 12 unknown  . albuterol (PROVENTIL) (2.5 MG/3ML) 0.083% nebulizer solution Take 2.5 mg by nebulization 4 (four) times daily.   12/24/2014 at 1200  . ALPRAZolam (XANAX) 1 MG tablet Take 1 mg by mouth 4 (four) times daily.    12/24/2014 at 1400  . Amino Acids-Protein Hydrolys (FEEDING SUPPLEMENT, PRO-STAT SUGAR FREE 64,) LIQD Take 30 mLs by mouth 2 (two) times daily with a meal.   12/24/2014 at 800a  . aspirin 325 MG tablet Take 1 tablet (325 mg total) by mouth daily. 30 tablet 12 12/24/2014 at [place]  . atorvastatin (LIPITOR) 40 MG tablet Take 40 mg by mouth daily.   12/24/2014 at [place]  . baclofen (LIORESAL) 10 MG tablet Take 5 mg by mouth 3 (three) times daily.   12/24/2014 at 1400  . cefTRIAXone  (ROCEPHIN) 1 G injection Inject 1 g into the muscle once. Last administered on 12/23/2014 at 22:00 IM to right hip   12/23/2014  . cholecalciferol (VITAMIN D) 1000 UNITS tablet Take 1,000 Units by mouth daily.   12/24/2014 at [place]  . clopidogrel (PLAVIX) 75 MG tablet Take 1 tablet (75 mg total) by mouth daily with breakfast.   12/24/2014 at [place]  . Cranberry 450 MG CAPS Take 1 capsule by mouth daily.   12/24/2014 at 900a  . guaiFENesin (MUCINEX) 600 MG 12 hr tablet Take 600 mg by mouth 2 (two) times daily.   12/24/2014 at 800a  . haloperidol (HALDOL) 0.5 MG tablet Take 0.5 mg by mouth 2 (two) times daily.    12/24/2014 at 900a  . levothyroxine (SYNTHROID, LEVOTHROID) 88 MCG tablet Take 1 tablet (88 mcg total) by mouth daily before breakfast. 30 tablet 12 12/24/2014 at 600a  . loperamide (IMODIUM) 2 MG capsule Take 1 capsule (2 mg total) by mouth as needed for diarrhea or loose stools. 30 capsule 0 unknown  . loratadine (CLARITIN) 10 MG tablet Take 10 mg by mouth daily.   12/24/2014 at [place]  . nitroGLYCERIN (NITROSTAT) 0.4 MG SL tablet Place 1 tablet (0.4 mg total) under the tongue every 5 (five) minutes as needed for chest pain. 25 tablet 12 unknown  . pantoprazole (PROTONIX) 40 MG tablet Take 1 tablet (40 mg total) by mouth daily. 30 tablet 12 12/24/2014 at 600a  . polyethylene glycol (MIRALAX / GLYCOLAX) packet Take 17 g by mouth daily.   12/24/2014 at 900a  . potassium chloride SA (K-DUR,KLOR-CON) 20 MEQ tablet Take 10 mEq by mouth daily.    12/24/2014 at [place]  . predniSONE (DELTASONE) 5 MG tablet Take 5 mg by mouth daily with breakfast.   12/24/2014 at [place]  . ranolazine (RANEXA) 500 MG 12 hr tablet Take 1 tablet (500 mg total) by mouth 2 (two) times daily. 60 tablet 3 12/24/2014 at [place]  . saccharomyces boulardii (FLORASTOR) 250 MG capsule Take 250 mg by mouth 2 (two) times daily.   12/24/2014 at 800a  . sertraline (ZOLOFT) 100 MG tablet Take 1 tablet (100 mg total) by mouth daily.   12/24/2014 at 900a  . temazepam  (RESTORIL) 7.5 MG capsule Take 7.5 mg by mouth at bedtime as needed for sleep.   unknown  . traMADol (ULTRAM) 50 MG tablet Take 50 mg by mouth every 6 (six) hours as needed for moderate pain.  unknown  . trolamine salicylate (ASPERCREME) 10 % cream Apply topically as needed for muscle pain. (Patient not taking: Reported on 12/24/2014) 85 g 0 Taking  . [DISCONTINUED] furosemide (LASIX) 20 MG tablet Take 20 mg by mouth daily.   Taking   Scheduled: . albuterol  2.5 mg Nebulization QID  . antiseptic oral rinse  7 mL Mouth Rinse BID  . enoxaparin (LOVENOX) injection  40 mg Subcutaneous Q24H  . fluconazole (DIFLUCAN) IV  100 mg Intravenous Q24H  . furosemide  40 mg Intravenous Daily  . haloperidol lactate  1 mg Intravenous Q12H  . insulin aspart  0-20 Units Subcutaneous TID WC  . methylPREDNISolone (SOLU-MEDROL) injection  40 mg Intravenous Q6H  . metroNIDAZOLE  500 mg Oral 3 times per day  . pantoprazole (PROTONIX) IV  40 mg Intravenous Q24H  . piperacillin-tazobactam (ZOSYN)  IV  3.375 g Intravenous Q8H  . potassium chloride  40 mEq Oral QID  . sodium chloride  10-40 mL Intracatheter Q12H  . vancomycin  1,250 mg Intravenous Q24H   Continuous: . sodium chloride 100 mL/hr at 01/08/15 0300   ELY:HTMBPJPETKKOE, albuterol, fentaNYL, LORazepam, ondansetron (ZOFRAN) IV, sodium chloride  Assesment: She was admitted with acute on chronic respiratory failure requiring intubation and mechanical ventilation. She had improved from it was being readied for discharge to a skilled care facility but again developed respiratory distress and was intubated and placed on mechanical ventilation. She was septic on admission and I think she was septic again. This time it appears that she had a Candida UTI. This is being treated. She is also being treated for healthcare associated pneumonia. She has now developed C. difficile colitis she is hypokalemic Active Problems:   Morbid obesity   Cerebral palsy    Hypertension   Chronic respiratory failure with hypoxia   Sepsis   Acute and chronic respiratory failure with hypercapnia   Encephalopathy, metabolic   UTI (urinary tract infection)   Protein-calorie malnutrition, severe   Candida UTI   COPD (chronic obstructive pulmonary disease)   Coronary atherosclerosis of native coronary artery   Hypokalemia    Plan: I don't think she is quite ready for discharge but maybe tomorrow    LOS: 16 days   Devonda Pequignot L 01/09/2015, 8:57 AM

## 2015-01-09 NOTE — Clinical Social Work Note (Signed)
CSW spoke with Jackelyn Poling at [facility]. Debbie indicated that the facility now had a bed opening for patient.  CSW advised that patient had tested positive for C.Diff. CSW advised that patient would likely be discharged tomorrow.   CSW spoke with [name]. CSW advised that patient had a bed available at [facility].  Ms. Benjamine Mola accepted the bed offer from [facility].  CSW advised that patient could likely be discharged tomorrow.    Ambrose Pancoast, [place]

## 2015-01-09 NOTE — Progress Notes (Signed)
Speech Language Pathology Treatment: Dysphagia  Patient Details Name: [name] MRN: [id number] DOB: [birth year] Today's Date: 01/09/2015 Time: 2023-3435 SLP Time Calculation (min) (ACUTE ONLY): 34 min  Assessment / Plan / Recommendation Clinical Impression  [name] was seen up in bed for swallow treat following extubation (yesterday). Her diet at [facility] PTA was mechanical soft and thin liquids. She expressed a desire to have the same here. She stated that she does not want to have pureed foods. [name] tolerated mech soft textures without incident when presented slowly and in small bites. She will need her meats cut up. Discussed with RN, will change diet to D3/mech soft. It is anticipated that she will return to [facility] tomorrow. No further SLP services indicated.    HPI HPI: Pt is a 74 y.o. female with history of cerebral palsy- wheelchair bound or bedbound. Presented to ED 1/5 with fever, decreased responsiveness and likely UTI, encephalopathy, and sepsis. Pt also has hx of PNA and dysphagia- MBS 03/18/14 revealed mild-mod dysphagia with silent flash penetration; recommendation at that time was dysphagia 2 diet/ thin liquids. Pt was intubated on vent support 1/5, extubated 1/12. CXR revealed bibasilar atelectasis 1/6. Pt placed on dysphagia 1/ thin liquid diet on 1/12, bedside swallow eval ordered to consider advancement to dysphagia 2.    Pertinent Vitals Pain Assessment: No/denies pain  SLP Plan  Continue with current plan of care    Recommendations Diet recommendations: Dysphagia 3 (mechanical soft);Thin liquid Liquids provided via: Straw Medication Administration: Whole meds with liquid Supervision: Full supervision/cueing for compensatory strategies Compensations: Slow rate;Small sips/bites Postural Changes and/or Swallow Maneuvers: Seated upright 90 degrees              Plan: Continue with current plan of care    Thank you,  Genene Churn, [place]       [place] 01/09/2015, 4:59 PM

## 2015-01-09 NOTE — Progress Notes (Signed)
NUTRITION FOLLOW UP  Intervention:   -Glucerna Shake po BID, each supplement provides 220 kcal and 10 grams of protein  Nutrition Dx:   Inadequate oral intake related now to decreased appetite as evidenced by PO: 50%.   Goal:   Pt will meet >90% of estimated nutritional needs  Monitor:   PO/supplement intake, labs, weight changes, I/O's  Assessment:   Pt has UTI, encephalopathy and is septic. Her hx includes COPD, Pneumonia and Cerebral palsy.   Pt was extubated on 01/07/15 and has been transferred to floor out of ICU. Nutritional needs re-estimated due to change in status.  She has been advanced to a carb modified diet. Noted SLP recommend a dysphagia 2 diet with thin liquids prior to extubation.  Spoke with pt who reports fair appetite. She reports she is tolerating current diet well. She estimated she ate about half of her breakfast today. This is confirmed by intake records. Noted that intake was poor prior to re-intubation, due to pt being on a pureed diet, which is not typical for her PTA.  Noted significant decline in wt since admission, likely due to diuresis. Pt continues on IV lasix and wt has been stable since latest extubation.  Per MD notes, possible d/c tomorrow back to SNF.  Labs reviewed. K: 2.6 (on supplement), Creat: 0.36, Calcium: 7.7, Mg: 2.6, Phos: 7.5, Glucose: 127, CBGS: 132-154.   Nutrition Focused Physical Exam:  Subcutaneous Fat:  Orbital Region: WDL Upper Arm Region: WDL Thoracic and Lumbar Region: WDL  Muscle:  Temple Region: WDL Clavicle Bone Region: WDL Clavicle and Acromion Bone Region: WDL Scapular Bone Region: WDL Dorsal Hand: WDL Patellar Region: WDL Anterior Thigh Region: WDL Posterior Calf Region: mild depletion  Edema: none present  Height: Ht Readings from Last 1 Encounters:  01/06/15 5\' 6"  (1.676 m)    Weight Status:   Wt Readings from Last 1 Encounters:  01/09/15 189 lb 2.5 oz (85.8 kg)   01/06/15 187 lb 6.3 oz (85 kg)    01/02/15 205 lb 11 oz (93.3 kg)   12/31/14 220 lb 7.4 oz (100 kg)   12/28/14 218 lb 0.6 oz (98.9 kg)   12/25/14 216 lb 7.9 oz (98.2 kg)    Re-estimated needs:  Kcal: 1600-1800 Protein: 72-82 grams Fluid: 1.6-1.8 L  Skin: Intact  Diet Order: Diet Carb Modified   Intake/Output Summary (Last 24 hours) at 01/09/15 1019 Last data filed at 01/09/15 0800  Gross per 24 hour  Intake    480 ml  Output   2250 ml  Net  -1770 ml    Last BM: 01/08/15   Labs:   Recent Labs Lab 01/05/15 1445 01/05/15 2012 01/07/15 0541 01/08/15 0432 01/09/15 0613  NA 144 147*  --  143 139  K 6.4* 3.9  --  2.6* 2.6*  CL 100 101  --  102 101  CO2 37* 40*  --  33* 31  BUN 26* 23  --  22 15  CREATININE 0.45* 0.39* 0.38* 0.31* 0.36*  CALCIUM 8.6 8.4  --  8.4 7.7*  MG 2.6*  --   --   --   --   PHOS 7.5*  --   --   --   --   GLUCOSE 194* 100*  --  141* 127*    CBG (last 3)   Recent Labs  01/08/15 1719 01/08/15 2122 01/09/15 0727  GLUCAP 154* 147* 132*    Scheduled Meds: . albuterol  2.5 mg Nebulization QID  .  antiseptic oral rinse  7 mL Mouth Rinse BID  . enoxaparin (LOVENOX) injection  40 mg Subcutaneous Q24H  . fluconazole (DIFLUCAN) IV  100 mg Intravenous Q24H  . furosemide  40 mg Intravenous Daily  . haloperidol lactate  1 mg Intravenous Q12H  . insulin aspart  0-20 Units Subcutaneous TID WC  . methylPREDNISolone (SOLU-MEDROL) injection  40 mg Intravenous Q6H  . metroNIDAZOLE  500 mg Oral 3 times per day  . pantoprazole (PROTONIX) IV  40 mg Intravenous Q24H  . potassium chloride  40 mEq Oral QID  . sodium chloride  10-40 mL Intracatheter Q12H    Continuous Infusions: . sodium chloride 100 mL/hr at 01/08/15 0300    Ashtian Villacis A. Jimmye Norman, RD, LDN, CDE Pager: [phone] After hours Pager: [phone]

## 2015-01-10 LAB — CULTURE, BLOOD (ROUTINE X 2): CULTURE: NO GROWTH

## 2015-01-10 LAB — BASIC METABOLIC PANEL
Anion gap: 6 (ref 5–15)
BUN: 15 mg/dL (ref 6–23)
CO2: 33 mmol/L — AB (ref 19–32)
Calcium: 8.3 mg/dL — ABNORMAL LOW (ref 8.4–10.5)
Chloride: 103 mEq/L (ref 96–112)
Creatinine, Ser: 0.33 mg/dL — ABNORMAL LOW (ref 0.50–1.10)
GFR calc Af Amer: >90 mL/min (ref >90)
Glucose, Bld: 139 mg/dL — ABNORMAL HIGH (ref 70–99)
Potassium: 4.5 mmol/L (ref 3.5–5.1)
Sodium: 142 mmol/L (ref 135–145)

## 2015-01-10 LAB — GLUCOSE, CAPILLARY
Glucose-Capillary: 140 mg/dL — ABNORMAL HIGH (ref 70–99)
Glucose-Capillary: 185 mg/dL — ABNORMAL HIGH (ref 70–99)

## 2015-01-10 MED ORDER — PANTOPRAZOLE SODIUM 40 MG PO TBEC: 40.0000 mg | DELAYED_RELEASE_TABLET | Freq: Every day | ORAL | Status: DC

## 2015-01-10 MED ORDER — METRONIDAZOLE 500 MG PO TABS: 500.0000 mg | ORAL_TABLET | Freq: Three times a day (TID) | ORAL | Status: DC

## 2015-01-10 MED ORDER — ALPRAZOLAM 1 MG PO TABS
1.0000 mg | ORAL_TABLET | Freq: Once | ORAL | Status: AC
  Administered 2015-01-10: 1 mg via ORAL
  Filled 2015-01-10: qty 1

## 2015-01-10 MED ORDER — FLUCONAZOLE 100 MG PO TABS: 100.0000 mg | ORAL_TABLET | Freq: Every day | ORAL | Status: DC

## 2015-01-10 NOTE — Progress Notes (Signed)
PHARMACIST - PHYSICIAN COMMUNICATION DR:   Luan Pulling CONCERNING: Antibiotic IV to Oral Route Change Policy  RECOMMENDATION: This patient is receiving Diflucan by the intravenous route.  Based on criteria approved by the Pharmacy and Therapeutics Committee, the antibiotic(s) is/are being converted to the equivalent oral dose form(s).   DESCRIPTION: These criteria include:  Patient being treated for a respiratory tract infection, urinary tract infection, cellulitis or clostridium difficile associated diarrhea if on metronidazole  The patient is not neutropenic and does not exhibit a GI malabsorption state  The patient is eating (either orally or via tube) and/or has been taking other orally administered medications for a least 24 hours  The patient is improving clinically and has a Tmax < 100.5  If you have questions about this conversion, please contact the Pharmacy Department  [x]   [phone] )  [facility] []   [phone] )  [facility]  []   [phone] )  [facility] []   [phone] )  [facility]  The patient is receiving Protonix by the intravenous route.  Based on criteria approved by the Pharmacy and [place], the medication is being converted to the equivalent oral dose form.  These criteria include: -No Active GI bleeding -Able to tolerate diet of full liquids (or better) or tube feeding OR able to tolerate other medications by the oral or enteral route  If you have any questions about this conversion, please contact the Pharmacy Department (ext [phone]).  Thank you.  Biagio Borg, [facility] 01/10/2015 10:35 AM    Netta Cedars, PharmD, BCPS 01/10/2015@10 :35 AM

## 2015-01-10 NOTE — Clinical Social Work Note (Signed)
CSW facilitated discharge.  CSW notified facility. CSW notified patient's sister, Ruby Cola, and advised that she was being discharged and transported by [facility]. CSW contacted [facility].    CSW signing off.   Ambrose Pancoast, [place]

## 2015-01-10 NOTE — Progress Notes (Signed)
Patient with orders to be discharged to [facility]. Prior to discharge, PICC line was removed per protocol and Foley was removed. Patient has voided. Report called to Claiborne Billings, Nurse. Discharge packet sent with patient. Patient stable. Patient transported via EMS.

## 2015-01-10 NOTE — Progress Notes (Signed)
Patient requested something for her nerves. IV ativan ordered, pt PICC line removed per order. Patient is pending discharge. Dr. Luan Pulling notified. New order for one time dose of Xanax 1mg  PO.

## 2015-01-15 ENCOUNTER — Encounter: Payer: Self-pay | Admitting: Cardiovascular Disease

## 2015-01-15 ENCOUNTER — Ambulatory Visit: Payer: Medicare Other | Admitting: Cardiovascular Disease

## 2015-01-16 ENCOUNTER — Encounter (HOSPITAL_COMMUNITY): Payer: Self-pay

## 2015-01-30 ENCOUNTER — Other Ambulatory Visit (HOSPITAL_COMMUNITY): Payer: Self-pay

## 2015-01-30 DIAGNOSIS — M810 Age-related osteoporosis without current pathological fracture: Secondary | ICD-10-CM

## 2015-01-30 DIAGNOSIS — C50911 Malignant neoplasm of unspecified site of right female breast: Secondary | ICD-10-CM

## 2015-01-31 ENCOUNTER — Encounter (HOSPITAL_BASED_OUTPATIENT_CLINIC_OR_DEPARTMENT_OTHER): Payer: No Typology Code available for payment source

## 2015-01-31 ENCOUNTER — Encounter (HOSPITAL_COMMUNITY): Payer: No Typology Code available for payment source | Attending: Oncology | Admitting: Oncology

## 2015-01-31 ENCOUNTER — Encounter (HOSPITAL_COMMUNITY): Payer: Self-pay | Admitting: Oncology

## 2015-01-31 ENCOUNTER — Encounter (HOSPITAL_COMMUNITY): Payer: No Typology Code available for payment source

## 2015-01-31 VITALS — BP 102/48 | Temp 98.1°F | Resp 18

## 2015-01-31 DIAGNOSIS — Z853 Personal history of malignant neoplasm of breast: Secondary | ICD-10-CM

## 2015-01-31 DIAGNOSIS — M810 Age-related osteoporosis without current pathological fracture: Secondary | ICD-10-CM | POA: Insufficient documentation

## 2015-01-31 DIAGNOSIS — C50911 Malignant neoplasm of unspecified site of right female breast: Secondary | ICD-10-CM | POA: Diagnosis not present

## 2015-01-31 LAB — COMPREHENSIVE METABOLIC PANEL
ALT: 22 U/L (ref 0–35)
AST: 21 U/L (ref 0–37)
Albumin: 3.3 g/dL — ABNORMAL LOW (ref 3.5–5.2)
Alkaline Phosphatase: 54 U/L (ref 39–117)
Anion gap: 8 (ref 5–15)
BUN: 14 mg/dL (ref 6–23)
CALCIUM: 8.6 mg/dL (ref 8.4–10.5)
CHLORIDE: 96 mmol/L (ref 96–112)
CO2: 36 mmol/L — ABNORMAL HIGH (ref 19–32)
CREATININE: 0.4 mg/dL — AB (ref 0.50–1.10)
GFR calc Af Amer: >90 mL/min (ref >90)
GLUCOSE: 140 mg/dL — AB (ref 70–99)
POTASSIUM: 3.8 mmol/L (ref 3.5–5.1)
SODIUM: 140 mmol/L (ref 135–145)
Total Bilirubin: 0.5 mg/dL (ref 0.3–1.2)
Total Protein: 5.6 g/dL — ABNORMAL LOW (ref 6.0–8.3)

## 2015-01-31 LAB — CBC WITH DIFFERENTIAL/PLATELET
BASOS PCT: 1 % (ref 0–1)
Basophils Absolute: 0 10*3/uL (ref 0.0–0.1)
EOS ABS: 0 10*3/uL (ref 0.0–0.7)
Eosinophils Relative: 0 % (ref 0–5)
HCT: 43.1 % (ref 36.0–46.0)
Hemoglobin: 13 g/dL (ref 12.0–15.0)
Lymphocytes Relative: 15 % (ref 12–46)
Lymphs Abs: 1 10*3/uL (ref 0.7–4.0)
MCH: 26.9 pg (ref 26.0–34.0)
MCHC: 30.2 g/dL (ref 30.0–36.0)
MCV: 89 fL (ref 78.0–100.0)
MONOS PCT: 6 % (ref 3–12)
Monocytes Absolute: 0.4 10*3/uL (ref 0.1–1.0)
NEUTROS ABS: 4.9 10*3/uL (ref 1.7–7.7)
NEUTROS PCT: 78 % — AB (ref 43–77)
PLATELETS: 223 10*3/uL (ref 150–400)
RBC: 4.84 MIL/uL (ref 3.87–5.11)
RDW: 16.9 % — ABNORMAL HIGH (ref 11.5–15.5)
WBC: 6.3 10*3/uL (ref 4.0–10.5)

## 2015-01-31 MED ORDER — DENOSUMAB 60 MG/ML ~~LOC~~ SOLN
60.0000 mg | Freq: Once | SUBCUTANEOUS | Status: AC
  Administered 2015-01-31: 60 mg via SUBCUTANEOUS
  Filled 2015-01-31: qty 1

## 2015-01-31 NOTE — Assessment & Plan Note (Signed)
On Prolia every 6 months.  Labs to be performed by [facility] every 6 months: CBC diff, CMET.

## 2015-01-31 NOTE — Progress Notes (Signed)
Labs for cbcd,cmp,vd25

## 2015-01-31 NOTE — Progress Notes (Signed)
[name] [place] [address]  Infiltrating ductal carcinoma of right breast  Osteoporosis  CURRENT THERAPY: Surveillance per NCCN guidelines  INTERVAL HISTORY: [name] 74 y.o. 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 Estrogen receptor was 100%, progesterone receptor was 90%, HER-2/neu was 0, and her Ki-67 marker was low at 9%, HER-2 was negative as mentioned.  She had her surgery on 03/20/2007 as far as the sentinel nodes were concerned. She was unable to take radiation due to her cerebral palsy and uncontrolled movements.  Started Aromasin on 04/12/2007 and completed 5 + years of therapy.  It was discontinued in 2015.    I personally reviewed and went over laboratory results with the patient.  The results are noted within this dictation.  I personally reviewed and went over radiographic studies with the patient.  The results are noted within this dictation.    Chart reviewed.  [name] was admitted to the hospital in Jan 2016 with acute respiratory failure secondary to sepsis requiring mechanical ventilation.  She is now back to her baseline she reports.   She notes some non-oncologic issues including bilateral knee pain that is likely arthritic in nature.  She does have Tramadol ordered at the nursing facility and she just needs to ask for the medication.    Oncologically, she denies any complaints and ROS questioning is negative.   Past Medical History  Diagnosis Date  . Cerebral palsy   . Asthma   . Seasonal allergies   . Thyroid disease   . HTN (hypertension)   . Anxiety   . Arthritis   . Breast cancer     Right breast, infiltrating ductal.  . Anginal pain   . Osteoporosis 05/08/2012  . Osteoporosis 05/08/2012  . Stroke   . Angina at rest   . Chronic steroid  use   . Pneumonia 09/2013  . Dysphagia   . Hypopotassemia   . Respiratory failure   . Generalized weakness   . Urinary retention   . Dysphagia   . Acute MI   . Hypopotassemia   . Sepsis   . Neurogenic bladder   . Reflux   . Obesity   . Staph infection January 2016  . Clostridium difficile infection January 2016    has CARPAL TUNNEL SYNDROME, BILATERAL; COMPLETE RUPTURE OF ROTATOR CUFF; CLOSED FRACTURE OF DISTAL END OF ULNA; DEGENERATIVE JOINT DISEASE, RIGHT KNEE; BURSITIS, KNEE; Hemarthrosis involving knee joint; Sprain of left knee; Arthritis of knee, right; Infiltrating ductal carcinoma of right breast; Sciatica; DDD (degenerative disc disease), lumbar; Osteoporosis; Wheezes; Morbid obesity; Cerebral palsy; Thrombocytopenia; Asthma; Hypertension; Chronic steroid use; Leg edema, left; Chronic respiratory failure with hypoxia; Hypothyroidism; Asthma with acute exacerbation; Pneumonia; Anxiety state, unspecified; NSTEMI (non-ST elevated myocardial infarction); Sepsis; Influenza A; HCAP (healthcare-associated pneumonia); Acute and chronic respiratory failure with hypercapnia; Encephalopathy, metabolic; Respiratory failure; Intertriginous skin ulcer, limited to breakdown of skin; UTI (urinary tract infection); Protein-calorie malnutrition, severe; Candida UTI; COPD (chronic obstructive pulmonary disease); Coronary atherosclerosis of native coronary artery; and Hypokalemia on her problem list.     is allergic to latex and naproxen.  [name] had no medications administered during this visit.  Past Surgical History  Procedure Laterality Date  . Breast lumpectomy    . Radical abdominal hysterectomy    .  Dental extraction    . Right hand surgery      Denies any headaches, dizziness, double vision, fevers, chills, night sweats, nausea, vomiting, diarrhea, constipation, chest pain, heart palpitations, shortness of breath, blood in stool, black tarry stool, urinary pain, urinary burning,  urinary frequency, hematuria.   PHYSICAL EXAMINATION  ECOG PERFORMANCE STATUS: 3 - Symptomatic, >50% confined to bed  Filed Vitals:   01/31/15 1149  BP: 102/48  Temp: 98.1 F (36.7 C)  Resp: 18    GENERAL:alert, no distress, well nourished, well developed, comfortable, cooperative, obese and mentally handicapped SKIN: skin color, texture, turgor are normal, no rashes or significant lesions HEAD: Normocephalic, No masses, lesions, tenderness or abnormalities EYES: normal, PERRLA, EOMI, Conjunctiva are pink and non-injected EARS: External ears normal OROPHARYNX:lips, buccal mucosa, and tongue normal and mucous membranes are moist  NECK: supple, no adenopathy, thyroid normal size, non-tender, without nodularity, no stridor, non-tender, trachea midline LYMPH:  no palpable lymphadenopathy BREAST:not examined LUNGS: clear to auscultation and percussion HEART: regular rate & rhythm, no murmurs, no gallops, S1 normal and S2 normal ABDOMEN:abdomen soft, non-tender, obese, normal bowel sounds and no masses or organomegaly BACK: Back symmetric, no curvature., No CVA tenderness EXTREMITIES:less then 2 second capillary refill, no joint deformities, effusion, or inflammation, no edema, no skin discoloration, no clubbing, no cyanosis  NEURO: alert & oriented x 3 with fluent speech, no focal motor/sensory deficits, in wheelchair   LABORATORY DATA: CBC    Component Value Date/Time   WBC 6.3 01/31/2015 1140   RBC 4.84 01/31/2015 1140   HGB 13.0 01/31/2015 1140   HCT 43.1 01/31/2015 1140   PLT 223 01/31/2015 1140   MCV 89.0 01/31/2015 1140   MCH 26.9 01/31/2015 1140   MCHC 30.2 01/31/2015 1140   RDW 16.9* 01/31/2015 1140   LYMPHSABS 1.0 01/31/2015 1140   MONOABS 0.4 01/31/2015 1140   EOSABS 0.0 01/31/2015 1140   BASOSABS 0.0 01/31/2015 1140      Chemistry      Component Value Date/Time   NA 140 01/31/2015 1140   K 3.8 01/31/2015 1140   CL 96 01/31/2015 1140   CO2 36* 01/31/2015  1140   BUN 14 01/31/2015 1140   CREATININE 0.40* 01/31/2015 1140      Component Value Date/Time   CALCIUM 8.6 01/31/2015 1140   ALKPHOS 54 01/31/2015 1140   AST 21 01/31/2015 1140   ALT 22 01/31/2015 1140   BILITOT 0.5 01/31/2015 1140     Labs performed on 01/17/2015 from [facility]: WBC 6.1 HGB 13.6 PLT 129,000 Ca: 9.3  RADIOGRAPHIC STUDIES:  Ct Head Wo Contrast  01/06/2015   CLINICAL DATA:  Question stroke.  Altered level of consciousness.  EXAM: CT HEAD WITHOUT CONTRAST  TECHNIQUE: Contiguous axial images were obtained from the base of the skull through the vertex without intravenous contrast.  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 This causes chronic compression of the cervicomedullary junction.  Partial opacification a left mastoid air cells which may be related to intubation. There is mild inflammatory mucosal thickening in the left maxillary sinus.  Orbits: No acute abnormality.  Brain: No evidence of acute infarction, hemorrhage, hydrocephalus, or mass lesion/mass effect. Sub lentiform fluid collections, right larger than left, dilated perivascular spaces based on previous MRI imaging.  IMPRESSION: 1. No acute intracranial finding. 2. 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 Although this finding is chronic, suggest cervical spine  precautions.   Electronically Signed   By: Jorje Guild M.D.   On: 01/06/2015 00:44   Dg Chest Port 1 View  01/08/2015   CLINICAL DATA:  Short of breath  EXAM: PORTABLE CHEST - 1 VIEW  COMPARISON:  01/05/2015  FINDINGS: Stable cardiomegaly. Endotracheal and NG tubes removed. Stable right PICC. Stable bibasilar atelectasis versus airspace disease. Stable vascular congestion.  IMPRESSION: Extubated.  Stable bibasilar atelectasis versus airspace disease and vascular congestion.   Electronically  Signed   By: Maryclare Bean M.D.   On: 01/08/2015 07:44   Dg Chest Port 1 View  01/05/2015   CLINICAL DATA:  Ventilator dependent respiratory failure. Orogastric tube placement.  EXAM: PORTABLE CHEST - 1 VIEW 1736 hr:  COMPARISON:  Portable chest x-ray earlier same date 1448 hr and dating back to 12/28/2014.  FINDINGS: Endotracheal tube tip remains in satisfactory position approximately 3 cm above carina. Right arm PICC tip difficult to visualize but the catheter can be followed to the level of the upper SVC. OG tube tip to the left of midline, though its exact location is difficult to determine as the left hemidiaphragm is silhouetted by the dense left lower lobe consolidation. Left upper quadrant bowel gas is present below the OG tube, and it is unclear if this is the gastric bubble or if this is the splenic flexure of the colon.  Cardiac silhouette enlarged but stable. Pulmonary venous hypertension with perhaps minimal to mild interstitial pulmonary edema, worse than earlier in the day. Stable dense consolidation in the left lower lobe. Linear atelectasis at the right lung base, unchanged.  IMPRESSION: 1. OG tube tip to the left of midline, though its exact position is difficult to determine as the diaphragm is not visible due to the dense left lower lobe atelectasis and/or pneumonia. A followup image of the upper abdomen may be helpful to determine if the bowel gas present in the left upper quadrant is the gastric air bubble or is gas in the splenic flexure the colon. 2. Developing pulmonary venous hypertension and perhaps minimal to mild interstitial pulmonary edema, query fluid overload. 3. Stable dense left lower lobe atelectasis and/or pneumonia. Stable linear atelectasis at the right lung base.   Electronically Signed   By: Evangeline Dakin M.D.   On: 01/05/2015 17:54   Dg Chest Portable 1 View  01/05/2015   CLINICAL DATA:  Intubation, history asthma, cerebral palsy, hypertension, MI  EXAM: PORTABLE CHEST  - 1 VIEW  COMPARISON:  Portable exam 1448 hr compared to 12/31/2014  FINDINGS: Tip of endotracheal tube projects 2.6 cm above carina.  Rotated to the LEFT.  Enlargement of cardiac silhouette with vascular congestion.  Bibasilar atelectasis, cannot exclude consolidation in LEFT lower lobe.  Upper lungs clear.  Tip of RIGHT arm PICC line projects over SVC.  IMPRESSION: Satisfactory endotracheal tube position.  Bibasilar atelectasis, cannot exclude consolidation in LEFT lower lobe.   Electronically Signed   By: Lavonia Dana M.D.   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 Estrogen receptor was 100%, progesterone receptor was 90%, HER-2/neu was 0, and her Ki-67 marker was low at 9%, HER-2 was negative as mentioned.  She had her surgery on 03/20/2007 as far as the sentinel nodes were concerned. She was unable to take radiation due  to her cerebral palsy and uncontrolled movements.  Started Aromasin on 04/12/2007 and completed 5 + years of therapy.  It was discontinued in 2015.  Labs in 12 months: CBC diff, CMET.  Return in 12 months for follow-up.   THERAPY PLAN:  NCCN guidelines recommends the following surveillance for invasive breast cancer:  A. History and Physical exam every 4-6 months for 5 years and then every 12 months.  B. Mammography every 12 months  C. Women on Tamoxifen: annual gynecologic assessment every 12 months if uterus is present.  D. Women on aromatase inhibitor or who experience ovarian failure secondary to treatment should have monitoring of bone health with a bone mineral density determination at baseline and periodically thereafter.  E. Assess and encourage adherence to adjuvant endocrine therapy.  F. Evidence suggests that active lifestyle and achieving and maintaining  an ideal body weight (20-25 BMI) may lead to optimal breast cancer outcomes.   All questions were answered. The patient knows to call the clinic with any problems, questions or concerns. We can certainly see the patient much sooner if necessary.  Patient and plan discussed with Dr. Ancil Linsey and she is in agreement with the aforementioned.   This note is electronically signed by: Robynn Pane 01/31/2015 12:21 PM

## 2015-01-31 NOTE — Progress Notes (Signed)
[name] presents today for injection per MD orders. Prolia administered SQ in right Abdomen. Administration without incident. Patient tolerated well.

## 2015-01-31 NOTE — Assessment & Plan Note (Addendum)
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 Estrogen receptor was 100%, progesterone receptor was 90%, HER-2/neu was 0, and her Ki-67 marker was low at 9%, HER-2 was negative as mentioned.  She had her surgery on 03/20/2007 as far as the sentinel nodes were concerned. She was unable to take radiation due to her cerebral palsy and uncontrolled movements.  Started Aromasin on 04/12/2007 and completed 5 + years of therapy.  It was discontinued in 2015.  Labs in 12 months: CBC diff, CMET.  Return in 12 months for follow-up.

## 2015-01-31 NOTE — Patient Instructions (Signed)
[place] at [facility]  Discharge Instructions:  Prolia injection today. Labs in 6 months and 12 months: CBC diff, CMET.  Labs are to be performed at [facility] and faxed to [facility] at [phone]. Prolia injection every 6 months Return in 12 months for follow-up appointment. _______________________________________________________________  Thank you for choosing [facility] at [facility] to provide your oncology and hematology care.  To afford each patient quality time with our providers, please arrive at least 15 minutes before your scheduled appointment.  You need to re-schedule your appointment if you arrive 10 or more minutes late.  We strive to give you quality time with our providers, and arriving late affects you and other patients whose appointments are after yours.  Also, if you no show three or more times for appointments you may be dismissed from the clinic.  Again, thank you for choosing [facility] hope is that these requests will allow you access to exceptional care and in a timely manner. _______________________________________________________________  If you have questions after your visit, please contact our office at [phone] between the hours of 8:30 a.m. and 5:00 p.m. Voicemails left after 4:30 p.m. will not be returned until the following business day. _______________________________________________________________  For prescription refill requests, have your pharmacy contact our office. _______________________________________________________________  Recommendations made by the consultant and any test results will be sent to your referring physician. _______________________________________________________________

## 2015-02-01 LAB — VITAMIN D 25 HYDROXY (VIT D DEFICIENCY, FRACTURES): Vit D, 25-Hydroxy: 37.5 ng/mL (ref 30.0–100.0)

## 2015-02-05 ENCOUNTER — Ambulatory Visit (INDEPENDENT_AMBULATORY_CARE_PROVIDER_SITE_OTHER): Payer: Medicare Other | Admitting: Cardiovascular Disease

## 2015-02-05 ENCOUNTER — Encounter: Payer: Self-pay | Admitting: Cardiovascular Disease

## 2015-02-05 VITALS — BP 112/64 | HR 137 | Ht 64.0 in | Wt 214.0 lb

## 2015-02-05 DIAGNOSIS — R079 Chest pain, unspecified: Secondary | ICD-10-CM

## 2015-02-05 DIAGNOSIS — Z136 Encounter for screening for cardiovascular disorders: Secondary | ICD-10-CM

## 2015-02-05 DIAGNOSIS — I639 Cerebral infarction, unspecified: Secondary | ICD-10-CM

## 2015-02-05 DIAGNOSIS — R000 Tachycardia, unspecified: Secondary | ICD-10-CM

## 2015-02-05 DIAGNOSIS — I252 Old myocardial infarction: Secondary | ICD-10-CM

## 2015-02-05 MED ORDER — METOPROLOL TARTRATE 25 MG PO TABS: 12.5000 mg | ORAL_TABLET | Freq: Two times a day (BID) | ORAL | Status: AC

## 2015-02-05 NOTE — Patient Instructions (Signed)
Your physician recommends that you schedule a follow-up appointment in: 2 weeks with Jory Sims, NP.  Your physician has recommended you make the following change in your medication:    Lopressor 12.5 mg Two times daily  Thank you for choosing [facility]!

## 2015-02-05 NOTE — Progress Notes (Signed)
Patient ID: [name], female   DOB: 05-May-1941, 74 y.o.   MRN: [id number]      SUBJECTIVE: The patient returns for routine follow up. She has a history of chronic respiratory failure/COPD and is on oxygen, hypertension, cerebral palsy, NSTEMI, and CVA. She was hospitalized in 1/16 for acute on chronic respiratory failure requiring mechanical ventilation and was diagnosed with healthcare associated pneumonia, candida UTI, and clostridium difficile colitis as per the medical records.  At her last visit on 11/28/14, I initiated Ranexa 500 mg bid and this has alleviated her chest pain. She has only taken 1 SL nitroglycerin in the last few months. Nuclear stress test on 12/18/14 showed no evidence of ischemia with soft tissue attenuation artifact noted, LVEF 72%. She currently denies chest pain and palpitations as well as shortness of breath. She was noted to be tachycardic when the nurse took her vital signs today. An ECG was performed which demonstrated sinus tachycardia with occasional PVC's, HR 111 bpm. Hgb 13 and K 3.8 on 2/12.    Review of Systems: As per "subjective", otherwise negative.  Allergies  Allergen Reactions  . Latex     Provided via MAR  . Naproxen Other (See Comments)    Unknown-Provided via MAR    Current Outpatient Prescriptions  Medication Sig Dispense Refill  . acetaminophen (TYLENOL) 325 MG tablet Take 2 tablets (650 mg total) by mouth every 6 (six) hours as needed for mild pain (or Fever >/= 101).    Marland Kitchen albuterol (PROVENTIL HFA;VENTOLIN HFA) 108 (90 BASE) MCG/ACT inhaler Inhale 2 puffs into the lungs every 4 (four) hours as needed. Shortness of breath 1 Inhaler 12  . albuterol (PROVENTIL) (2.5 MG/3ML) 0.083% nebulizer solution Take 2.5 mg by nebulization 4 (four) times daily.    Marland Kitchen ALPRAZolam (XANAX) 1 MG tablet Take 1 mg by mouth 4 (four) times daily.     Marland Kitchen aspirin 325 MG tablet Take 1 tablet (325 mg total) by mouth daily. 30 tablet 12  . atorvastatin  (LIPITOR) 40 MG tablet Take 40 mg by mouth daily.    . baclofen (LIORESAL) 10 MG tablet Take 5 mg by mouth 3 (three) times daily.    . cholecalciferol (VITAMIN D) 1000 UNITS tablet Take 1,000 Units by mouth daily.    . clopidogrel (PLAVIX) 75 MG tablet Take 1 tablet (75 mg total) by mouth daily with breakfast.    . Cranberry 450 MG CAPS Take 1 capsule by mouth daily.    . feeding supplement, GLUCERNA SHAKE, (GLUCERNA SHAKE) LIQD Take 237 mLs by mouth 2 (two) times daily between meals. 60 Can 0  . furosemide (LASIX) 20 MG tablet Take 2 tablets (40 mg total) by mouth daily. 30 tablet 12  . guaiFENesin (MUCINEX) 600 MG 12 hr tablet Take 600 mg by mouth 2 (two) times daily.    . haloperidol (HALDOL) 0.5 MG tablet Take 0.5 mg by mouth 2 (two) times daily.     . insulin aspart (NOVOLOG) 100 UNIT/ML injection Inject 0-20 Units into the skin 3 (three) times daily with meals. 10 mL 11  . levothyroxine (SYNTHROID, LEVOTHROID) 88 MCG tablet Take 1 tablet (88 mcg total) by mouth daily before breakfast. 30 tablet 12  . loperamide (IMODIUM) 2 MG capsule Take 1 capsule (2 mg total) by mouth as needed for diarrhea or loose stools. 30 capsule 0  . loratadine (CLARITIN) 10 MG tablet Take 10 mg by mouth daily.    . nitroGLYCERIN (NITROSTAT) 0.4 MG SL tablet  Place 1 tablet (0.4 mg total) under the tongue every 5 (five) minutes as needed for chest pain. 25 tablet 12  . pantoprazole (PROTONIX) 40 MG tablet Take 1 tablet (40 mg total) by mouth daily. 30 tablet 12  . polyethylene glycol (MIRALAX / GLYCOLAX) packet Take 17 g by mouth daily.    . potassium chloride SA (K-DUR,KLOR-CON) 20 MEQ tablet Take 2 tablets (40 mEq total) by mouth 2 (two) times daily. 60 tablet 12  . predniSONE (DELTASONE) 20 MG tablet Take 2 tablets (40 mg total) by mouth daily with breakfast.    . ranolazine (RANEXA) 500 MG 12 hr tablet Take 1 tablet (500 mg total) by mouth 2 (two) times daily. 60 tablet 3  . saccharomyces boulardii (FLORASTOR) 250  MG capsule Take 250 mg by mouth 2 (two) times daily.    . sertraline (ZOLOFT) 100 MG tablet Take 1 tablet (100 mg total) by mouth daily.    . temazepam (RESTORIL) 7.5 MG capsule Take 7.5 mg by mouth at bedtime as needed for sleep.    . traMADol (ULTRAM) 50 MG tablet Take 50 mg by mouth every 6 (six) hours as needed for moderate pain.    . metoprolol tartrate (LOPRESSOR) 25 MG tablet Take 0.5 tablets (12.5 mg total) by mouth 2 (two) times daily. 180 tablet 3   No current facility-administered medications for this visit.    Past Medical History  Diagnosis Date  . Cerebral palsy   . Asthma   . Seasonal allergies   . Thyroid disease   . HTN (hypertension)   . Anxiety   . Arthritis   . Breast cancer     Right breast, infiltrating ductal.  . Anginal pain   . Osteoporosis 05/08/2012  . Osteoporosis 05/08/2012  . Stroke   . Angina at rest   . Chronic steroid use   . Pneumonia 09/2013  . Dysphagia   . Hypopotassemia   . Respiratory failure   . Generalized weakness   . Urinary retention   . Dysphagia   . Acute MI   . Hypopotassemia   . Sepsis   . Neurogenic bladder   . Reflux   . Obesity   . Staph infection January 2016  . Clostridium difficile infection January 2016    Past Surgical History  Procedure Laterality Date  . Breast lumpectomy    . Radical abdominal hysterectomy    . Dental extraction    . Right hand surgery      History   Social History  . Marital Status: Widowed    Spouse Name: N/A  . Number of Children: N/A  . Years of Education: college   Occupational History  . disabled    Social History Main Topics  . Smoking status: Never Smoker   . Smokeless tobacco: Never Used  . Alcohol Use: No  . Drug Use: No  . Sexual Activity: No   Other Topics Concern  . Not on file   Social History Narrative     Filed Vitals:   02/05/15 1326  BP: 112/64  Pulse: 137  Height: 5\' 4"  (1.626 m)  Weight: 214 lb (97.07 kg)  SpO2: 97%    PHYSICAL  EXAM General: NAD HEENT: Tardive dyskinesia. Neck: No JVD, no thyromegaly. Lungs: Diminished sounds b/l with no rales or wheezes. CV: Nondisplaced PMI.Tachycardic, regular rhythm, normal S1/S2, no S3/S4, no murmur. No pretibial or periankle edema.  Abdomen: Soft, nontender, no distention.  Neurologic: Alert and oriented.  Psych: Normal affect.  Skin: Normal. Musculoskeletal: No gross deformities.  ECG: Most recent ECG reviewed above.    ASSESSMENT AND PLAN: 1. Chest pain in the context of prior NSTEMI which occurred in the setting of acute respiratory failure secondary to influenza A and a healthcare associated pneumonia: Nuclear MPI study results from 12/18/14 noted above. Given symptom reduction, I will continue Ranexa 500 mg bid.  2. Tachycardia: I will start metoprolol 12.5 mg bid and have her f/u in the next few weeks. She is asymptomatic from this standpoint but given her h/o NSTEMI, initiating a very low-dose beta blocker would be reasonable. If she were to develop wheezing, I would switch to a nondihydropyridine calcium channel blocker.  Dispo: f/u within next few weeks.  Time spent: 40 minutes, of which >50% reviewing details of prior hospitalization with patient and making management decisions.   Kate Sable, M.D., F.A.C.C.

## 2015-02-09 ENCOUNTER — Inpatient Hospital Stay (HOSPITAL_COMMUNITY): Payer: Medicare Other

## 2015-02-09 ENCOUNTER — Encounter (HOSPITAL_COMMUNITY): Payer: Medicare Other

## 2015-02-09 ENCOUNTER — Emergency Department (HOSPITAL_COMMUNITY): Payer: Medicare Other

## 2015-02-09 ENCOUNTER — Inpatient Hospital Stay (HOSPITAL_COMMUNITY)
Admission: EM | Admit: 2015-02-09 | Discharge: 2015-02-14 | DRG: 871 | Disposition: A | Discharge status: Skilled Nursing Facility | Payer: Medicare Other | Attending: Pulmonary Disease | Admitting: Pulmonary Disease

## 2015-02-09 ENCOUNTER — Encounter (HOSPITAL_COMMUNITY): Payer: Self-pay | Admitting: Emergency Medicine

## 2015-02-09 DIAGNOSIS — J9622 Acute and chronic respiratory failure with hypercapnia: Secondary | ICD-10-CM | POA: Diagnosis present

## 2015-02-09 DIAGNOSIS — N390 Urinary tract infection, site not specified: Secondary | ICD-10-CM | POA: Diagnosis present

## 2015-02-09 DIAGNOSIS — Z853 Personal history of malignant neoplasm of breast: Secondary | ICD-10-CM

## 2015-02-09 DIAGNOSIS — F419 Anxiety disorder, unspecified: Secondary | ICD-10-CM | POA: Diagnosis present

## 2015-02-09 DIAGNOSIS — Z8673 Personal history of transient ischemic attack (TIA), and cerebral infarction without residual deficits: Secondary | ICD-10-CM

## 2015-02-09 DIAGNOSIS — Z833 Family history of diabetes mellitus: Secondary | ICD-10-CM | POA: Diagnosis not present

## 2015-02-09 DIAGNOSIS — Z7982 Long term (current) use of aspirin: Secondary | ICD-10-CM

## 2015-02-09 DIAGNOSIS — Z7952 Long term (current) use of systemic steroids: Secondary | ICD-10-CM | POA: Diagnosis not present

## 2015-02-09 DIAGNOSIS — J690 Pneumonitis due to inhalation of food and vomit: Secondary | ICD-10-CM | POA: Diagnosis present

## 2015-02-09 DIAGNOSIS — A047 Enterocolitis due to Clostridium difficile: Secondary | ICD-10-CM | POA: Diagnosis present

## 2015-02-09 DIAGNOSIS — K219 Gastro-esophageal reflux disease without esophagitis: Secondary | ICD-10-CM | POA: Diagnosis present

## 2015-02-09 DIAGNOSIS — R0602 Shortness of breath: Secondary | ICD-10-CM | POA: Diagnosis present

## 2015-02-09 DIAGNOSIS — Z794 Long term (current) use of insulin: Secondary | ICD-10-CM | POA: Diagnosis not present

## 2015-02-09 DIAGNOSIS — M810 Age-related osteoporosis without current pathological fracture: Secondary | ICD-10-CM | POA: Diagnosis present

## 2015-02-09 DIAGNOSIS — E876 Hypokalemia: Secondary | ICD-10-CM | POA: Diagnosis present

## 2015-02-09 DIAGNOSIS — Z825 Family history of asthma and other chronic lower respiratory diseases: Secondary | ICD-10-CM | POA: Diagnosis not present

## 2015-02-09 DIAGNOSIS — B9620 Unspecified Escherichia coli [E. coli] as the cause of diseases classified elsewhere: Secondary | ICD-10-CM | POA: Diagnosis present

## 2015-02-09 DIAGNOSIS — E43 Unspecified severe protein-calorie malnutrition: Secondary | ICD-10-CM | POA: Diagnosis present

## 2015-02-09 DIAGNOSIS — J9620 Acute and chronic respiratory failure, unspecified whether with hypoxia or hypercapnia: Secondary | ICD-10-CM | POA: Diagnosis present

## 2015-02-09 DIAGNOSIS — G9341 Metabolic encephalopathy: Secondary | ICD-10-CM | POA: Diagnosis present

## 2015-02-09 DIAGNOSIS — G801 Spastic diplegic cerebral palsy: Secondary | ICD-10-CM | POA: Diagnosis present

## 2015-02-09 DIAGNOSIS — A419 Sepsis, unspecified organism: Principal | ICD-10-CM | POA: Diagnosis present

## 2015-02-09 DIAGNOSIS — I2510 Atherosclerotic heart disease of native coronary artery without angina pectoris: Secondary | ICD-10-CM | POA: Diagnosis present

## 2015-02-09 DIAGNOSIS — J189 Pneumonia, unspecified organism: Secondary | ICD-10-CM

## 2015-02-09 DIAGNOSIS — M1990 Unspecified osteoarthritis, unspecified site: Secondary | ICD-10-CM | POA: Diagnosis present

## 2015-02-09 DIAGNOSIS — L98491 Non-pressure chronic ulcer of skin of other sites limited to breakdown of skin: Secondary | ICD-10-CM

## 2015-02-09 DIAGNOSIS — J9690 Respiratory failure, unspecified, unspecified whether with hypoxia or hypercapnia: Secondary | ICD-10-CM

## 2015-02-09 DIAGNOSIS — J449 Chronic obstructive pulmonary disease, unspecified: Secondary | ICD-10-CM | POA: Diagnosis present

## 2015-02-09 DIAGNOSIS — E032 Hypothyroidism due to medicaments and other exogenous substances: Secondary | ICD-10-CM

## 2015-02-09 DIAGNOSIS — J9621 Acute and chronic respiratory failure with hypoxia: Secondary | ICD-10-CM | POA: Diagnosis not present

## 2015-02-09 DIAGNOSIS — I5033 Acute on chronic diastolic (congestive) heart failure: Secondary | ICD-10-CM | POA: Diagnosis present

## 2015-02-09 DIAGNOSIS — Z9981 Dependence on supplemental oxygen: Secondary | ICD-10-CM | POA: Diagnosis not present

## 2015-02-09 DIAGNOSIS — I10 Essential (primary) hypertension: Secondary | ICD-10-CM | POA: Diagnosis present

## 2015-02-09 DIAGNOSIS — Z8249 Family history of ischemic heart disease and other diseases of the circulatory system: Secondary | ICD-10-CM

## 2015-02-09 DIAGNOSIS — E039 Hypothyroidism, unspecified: Secondary | ICD-10-CM | POA: Diagnosis present

## 2015-02-09 DIAGNOSIS — J811 Chronic pulmonary edema: Secondary | ICD-10-CM

## 2015-02-09 DIAGNOSIS — C50911 Malignant neoplasm of unspecified site of right female breast: Secondary | ICD-10-CM

## 2015-02-09 DIAGNOSIS — M75120 Complete rotator cuff tear or rupture of unspecified shoulder, not specified as traumatic: Secondary | ICD-10-CM | POA: Diagnosis not present

## 2015-02-09 DIAGNOSIS — I252 Old myocardial infarction: Secondary | ICD-10-CM | POA: Diagnosis not present

## 2015-02-09 DIAGNOSIS — J45909 Unspecified asthma, uncomplicated: Secondary | ICD-10-CM | POA: Diagnosis present

## 2015-02-09 DIAGNOSIS — I214 Non-ST elevation (NSTEMI) myocardial infarction: Secondary | ICD-10-CM

## 2015-02-09 HISTORY — DX: Acute respiratory failure, unspecified whether with hypoxia or hypercapnia: J96.00

## 2015-02-09 LAB — BLOOD GAS, ARTERIAL
ACID-BASE EXCESS: 11.2 mmol/L — AB (ref 0.0–2.0)
ACID-BASE EXCESS: 7.2 mmol/L — AB (ref 0.0–2.0)
ACID-BASE EXCESS: 6.3 mmol/L — AB (ref 0.0–2.0)
Acid-Base Excess: 11.4 mmol/L — ABNORMAL HIGH (ref 0.0–2.0)
BICARBONATE: 29.8 meq/L — AB (ref 20.0–24.0)
Bicarbonate: 38.7 mEq/L — ABNORMAL HIGH (ref 20.0–24.0)
Bicarbonate: 38.4 mEq/L — ABNORMAL HIGH (ref 20.0–24.0)
Bicarbonate: 31.3 mEq/L — ABNORMAL HIGH (ref 20.0–24.0)
DELIVERY SYSTEMS: POSITIVE
DRAWN BY: 10555
Drawn by: 27407
Drawn by: 27407
Drawn by: 22179
EXPIRATORY PAP: 8
FIO2: 1 %
FIO2: 1 %
FIO2: 70 %
FIO2: 0.4 %
INSPIRATORY PAP: 18
LHR: 20 {breaths}/min
MECHVT: 440 mL
O2 Saturation: 97.3 %
O2 Saturation: 99.1 %
O2 Saturation: 98.9 %
O2 Saturation: 98.6 %
PATIENT TEMPERATURE: 37
PATIENT TEMPERATURE: 37
PATIENT TEMPERATURE: 37
PCO2 ART: 45.1 mmHg — AB (ref 35.0–45.0)
PEEP: 5 cmH2O
PEEP: 5 cmH2O
PO2 ART: 214 mmHg — AB (ref 80.0–100.0)
Patient temperature: 37
RATE: 20 resp/min
TCO2: 36.6 mmol/L (ref 0–100)
TCO2: 36.4 mmol/L (ref 0–100)
TCO2: 28.8 mmol/L (ref 0–100)
TCO2: 26.4 mmol/L (ref 0–100)
VT: 440 mL
pCO2 arterial: 90.6 mmHg (ref 35.0–45.0)
pCO2 arterial: 89.3 mmHg (ref 35.0–45.0)
pCO2 arterial: 39.5 mmHg (ref 35.0–45.0)
pH, Arterial: 7.253 — ABNORMAL LOW (ref 7.350–7.450)
pH, Arterial: 7.257 — ABNORMAL LOW (ref 7.350–7.450)
pH, Arterial: 7.456 — ABNORMAL HIGH (ref 7.350–7.450)
pH, Arterial: 7.49 — ABNORMAL HIGH (ref 7.350–7.450)
pO2, Arterial: 104 mmHg — ABNORMAL HIGH (ref 80.0–100.0)
pO2, Arterial: 229 mmHg — ABNORMAL HIGH (ref 80.0–100.0)
pO2, Arterial: 112 mmHg — ABNORMAL HIGH (ref 80.0–100.0)

## 2015-02-09 LAB — BASIC METABOLIC PANEL
Anion gap: 2 — ABNORMAL LOW (ref 5–15)
BUN: 11 mg/dL (ref 6–23)
CO2: 38 mmol/L — ABNORMAL HIGH (ref 19–32)
Calcium: 7.7 mg/dL — ABNORMAL LOW (ref 8.4–10.5)
Chloride: 101 mmol/L (ref 96–112)
Creatinine, Ser: 0.31 mg/dL — ABNORMAL LOW (ref 0.50–1.10)
GFR calc Af Amer: >90 mL/min (ref >90)
GFR calc non Af Amer: >90 mL/min (ref >90)
Glucose, Bld: 133 mg/dL — ABNORMAL HIGH (ref 70–99)
Potassium: 4.6 mmol/L (ref 3.5–5.1)
Sodium: 141 mmol/L (ref 135–145)

## 2015-02-09 LAB — COMPREHENSIVE METABOLIC PANEL
ALT: 15 U/L (ref 0–35)
AST: 15 U/L (ref 0–37)
Albumin: 2.4 g/dL — ABNORMAL LOW (ref 3.5–5.2)
Alkaline Phosphatase: 57 U/L (ref 39–117)
Anion gap: 2 — ABNORMAL LOW (ref 5–15)
BUN: 11 mg/dL (ref 6–23)
CO2: 30 mmol/L (ref 19–32)
CREATININE: 0.34 mg/dL — AB (ref 0.50–1.10)
Calcium: 6.9 mg/dL — ABNORMAL LOW (ref 8.4–10.5)
Chloride: 109 mmol/L (ref 96–112)
GFR calc non Af Amer: >90 mL/min (ref >90)
Glucose, Bld: 168 mg/dL — ABNORMAL HIGH (ref 70–99)
Potassium: 4.4 mmol/L (ref 3.5–5.1)
Sodium: 141 mmol/L (ref 135–145)
Total Bilirubin: 0.7 mg/dL (ref 0.3–1.2)
Total Protein: 5.1 g/dL — ABNORMAL LOW (ref 6.0–8.3)

## 2015-02-09 LAB — CORTISOL: CORTISOL PLASMA: 27.1 ug/dL

## 2015-02-09 LAB — CBC WITH DIFFERENTIAL/PLATELET
Basophils Absolute: 0 10*3/uL (ref 0.0–0.1)
Basophils Relative: 0 % (ref 0–1)
Eosinophils Absolute: 0.1 10*3/uL (ref 0.0–0.7)
Eosinophils Relative: 1 % (ref 0–5)
HCT: 36.4 % (ref 36.0–46.0)
Hemoglobin: 10.7 g/dL — ABNORMAL LOW (ref 12.0–15.0)
Lymphocytes Relative: 11 % — ABNORMAL LOW (ref 12–46)
Lymphs Abs: 0.8 10*3/uL (ref 0.7–4.0)
MCH: 27.3 pg (ref 26.0–34.0)
MCHC: 29.4 g/dL — ABNORMAL LOW (ref 30.0–36.0)
MCV: 92.9 fL (ref 78.0–100.0)
Monocytes Absolute: 0.7 10*3/uL (ref 0.1–1.0)
Monocytes Relative: 9 % (ref 3–12)
Neutro Abs: 5.9 10*3/uL (ref 1.7–7.7)
Neutrophils Relative %: 79 % — ABNORMAL HIGH (ref 43–77)
Platelets: 139 10*3/uL — ABNORMAL LOW (ref 150–400)
RBC: 3.92 MIL/uL (ref 3.87–5.11)
RDW: 17.9 % — ABNORMAL HIGH (ref 11.5–15.5)
WBC: 7.5 10*3/uL (ref 4.0–10.5)

## 2015-02-09 LAB — URINALYSIS, ROUTINE W REFLEX MICROSCOPIC
Bilirubin Urine: NEGATIVE
Glucose, UA: NEGATIVE mg/dL
Ketones, ur: NEGATIVE mg/dL
Nitrite: POSITIVE — AB
Protein, ur: 100 mg/dL — AB
Specific Gravity, Urine: >1.03 — ABNORMAL HIGH (ref 1.005–1.030)
Urobilinogen, UA: 0.2 mg/dL (ref 0.0–1.0)
pH: 5.5 (ref 5.0–8.0)

## 2015-02-09 LAB — TYPE AND SCREEN
ABO/RH(D): O POS
Antibody Screen: NEGATIVE

## 2015-02-09 LAB — LACTIC ACID, PLASMA
LACTIC ACID, VENOUS: 0.8 mmol/L (ref 0.5–2.0)
LACTIC ACID, VENOUS: 1.3 mmol/L (ref 0.5–2.0)
LACTIC ACID, VENOUS: 1.4 mmol/L (ref 0.5–2.0)
Lactic Acid, Venous: 1.8 mmol/L (ref 0.5–2.0)

## 2015-02-09 LAB — BRAIN NATRIURETIC PEPTIDE: B Natriuretic Peptide: 240 pg/mL — ABNORMAL HIGH (ref 0.0–100.0)

## 2015-02-09 LAB — FIBRINOGEN

## 2015-02-09 LAB — TROPONIN I
Troponin I: <0.03 ng/mL (ref <0.031)
Troponin I: <0.03 ng/mL (ref <0.031)

## 2015-02-09 LAB — PROTIME-INR
INR: 1.06 (ref 0.00–1.49)
Prothrombin Time: 13.9 seconds (ref 11.6–15.2)

## 2015-02-09 LAB — MRSA PCR SCREENING: MRSA by PCR: POSITIVE — AB

## 2015-02-09 LAB — URINE MICROSCOPIC-ADD ON

## 2015-02-09 LAB — PROCALCITONIN: Procalcitonin: <0.1 ng/mL

## 2015-02-09 LAB — APTT: APTT: 37 s (ref 24–37)

## 2015-02-09 LAB — I-STAT CG4 LACTIC ACID, ED: Lactic Acid, Venous: 0.83 mmol/L (ref 0.5–2.0)

## 2015-02-09 LAB — STREP PNEUMONIAE URINARY ANTIGEN: STREP PNEUMO URINARY ANTIGEN: NEGATIVE

## 2015-02-09 MED ORDER — SODIUM CHLORIDE 0.9 % IV SOLN
1500.0000 mg | Freq: Once | INTRAVENOUS | Status: AC
  Administered 2015-02-09: 1500 mg via INTRAVENOUS
  Filled 2015-02-09: qty 1500

## 2015-02-09 MED ORDER — PROPOFOL 10 MG/ML IV EMUL
5.0000 ug/kg/min | Freq: Once | INTRAVENOUS | Status: AC
  Administered 2015-02-09: 30 ug/kg/min via INTRAVENOUS
  Administered 2015-02-10: 35 ug/kg/min via INTRAVENOUS
  Filled 2015-02-09: qty 300

## 2015-02-09 MED ORDER — LEVOTHYROXINE SODIUM 88 MCG PO TABS: 88.0000 ug | ORAL_TABLET | Freq: Every day | ORAL | Status: DC

## 2015-02-09 MED ORDER — SODIUM CHLORIDE 0.9 % IV BOLUS (SEPSIS)
1000.0000 mL | INTRAVENOUS | Status: AC
  Administered 2015-02-09: 1000 mL via INTRAVENOUS

## 2015-02-09 MED ORDER — HEPARIN SODIUM (PORCINE) 5000 UNIT/ML IJ SOLN
5000.0000 [IU] | Freq: Three times a day (TID) | INTRAMUSCULAR | Status: DC
  Administered 2015-02-09 – 2015-02-14 (×14): 5000 [IU] via SUBCUTANEOUS
  Filled 2015-02-09 (×15): qty 1

## 2015-02-09 MED ORDER — ALBUTEROL SULFATE (2.5 MG/3ML) 0.083% IN NEBU: 2.5000 mg | INHALATION_SOLUTION | RESPIRATORY_TRACT | Status: DC | PRN

## 2015-02-09 MED ORDER — ROCURONIUM BROMIDE 50 MG/5ML IV SOLN
80.0000 mg | Freq: Once | INTRAVENOUS | Status: AC
  Administered 2015-02-09: 80 mg via INTRAVENOUS

## 2015-02-09 MED ORDER — IPRATROPIUM-ALBUTEROL 0.5-2.5 (3) MG/3ML IN SOLN
3.0000 mL | Freq: Once | RESPIRATORY_TRACT | Status: AC
  Administered 2015-02-09: 3 mL via RESPIRATORY_TRACT
  Filled 2015-02-09: qty 3

## 2015-02-09 MED ORDER — CHLORHEXIDINE GLUCONATE 0.12 % MT SOLN
15.0000 mL | Freq: Two times a day (BID) | OROMUCOSAL | Status: DC
  Administered 2015-02-09 – 2015-02-11 (×4): 15 mL via OROMUCOSAL
  Filled 2015-02-09 (×4): qty 15

## 2015-02-09 MED ORDER — FUROSEMIDE 10 MG/ML IJ SOLN
20.0000 mg | Freq: Once | INTRAMUSCULAR | Status: AC
  Administered 2015-02-09: 20 mg via INTRAVENOUS
  Filled 2015-02-09: qty 2

## 2015-02-09 MED ORDER — ETOMIDATE 2 MG/ML IV SOLN
INTRAVENOUS | Status: AC
  Filled 2015-02-09: qty 20

## 2015-02-09 MED ORDER — LIDOCAINE HCL (CARDIAC) 20 MG/ML IV SOLN
INTRAVENOUS | Status: AC
  Filled 2015-02-09: qty 5

## 2015-02-09 MED ORDER — SODIUM CHLORIDE 0.9 % IJ SOLN
10.0000 mL | Freq: Two times a day (BID) | INTRAMUSCULAR | Status: DC
  Administered 2015-02-09 – 2015-02-13 (×7): 10 mL
  Administered 2015-02-13: 20 mL
  Administered 2015-02-14: 10 mL

## 2015-02-09 MED ORDER — ROCURONIUM BROMIDE 50 MG/5ML IV SOLN
INTRAVENOUS | Status: AC
  Filled 2015-02-09: qty 2

## 2015-02-09 MED ORDER — LEVOFLOXACIN IN D5W 500 MG/100ML IV SOLN
500.0000 mg | INTRAVENOUS | Status: DC
  Administered 2015-02-09: 500 mg via INTRAVENOUS
  Filled 2015-02-09: qty 100

## 2015-02-09 MED ORDER — LEVOTHYROXINE SODIUM 100 MCG IV SOLR
44.0000 ug | Freq: Every day | INTRAVENOUS | Status: DC
  Administered 2015-02-09 – 2015-02-12 (×4): 44 ug via INTRAVENOUS
  Filled 2015-02-09 (×6): qty 5

## 2015-02-09 MED ORDER — SODIUM CHLORIDE 0.9 % IV SOLN
Freq: Once | INTRAVENOUS | Status: AC
  Administered 2015-02-09: 13:00:00 via INTRAVENOUS

## 2015-02-09 MED ORDER — IPRATROPIUM BROMIDE 0.02 % IN SOLN: 0.5000 mg | RESPIRATORY_TRACT | Status: DC

## 2015-02-09 MED ORDER — ETOMIDATE 2 MG/ML IV SOLN
24.0000 mg | Freq: Once | INTRAVENOUS | Status: AC
Start: 2015-02-09 — End: 2015-02-09
  Administered 2015-02-09: 24 mg via INTRAVENOUS

## 2015-02-09 MED ORDER — PIPERACILLIN-TAZOBACTAM 3.375 G IVPB
3.3750 g | Freq: Three times a day (TID) | INTRAVENOUS | Status: DC
  Administered 2015-02-09 – 2015-02-14 (×14): 3.375 g via INTRAVENOUS
  Filled 2015-02-09 (×24): qty 50

## 2015-02-09 MED ORDER — IPRATROPIUM-ALBUTEROL 0.5-2.5 (3) MG/3ML IN SOLN
3.0000 mL | RESPIRATORY_TRACT | Status: DC
  Administered 2015-02-09 – 2015-02-14 (×28): 3 mL via RESPIRATORY_TRACT
  Filled 2015-02-09 (×29): qty 3

## 2015-02-09 MED ORDER — CETYLPYRIDINIUM CHLORIDE 0.05 % MT LIQD
7.0000 mL | Freq: Four times a day (QID) | OROMUCOSAL | Status: DC
  Administered 2015-02-09 – 2015-02-11 (×7): 7 mL via OROMUCOSAL

## 2015-02-09 MED ORDER — ASPIRIN 325 MG PO TABS
325.0000 mg | ORAL_TABLET | Freq: Every day | ORAL | Status: DC
  Administered 2015-02-10 – 2015-02-14 (×5): 325 mg via ORAL
  Filled 2015-02-09 (×5): qty 1

## 2015-02-09 MED ORDER — ERYTHROMYCIN 5 MG/GM OP OINT
1.0000 "application " | TOPICAL_OINTMENT | Freq: Four times a day (QID) | OPHTHALMIC | Status: DC
  Administered 2015-02-09 – 2015-02-14 (×19): 1 via OPHTHALMIC
  Filled 2015-02-09: qty 3.5

## 2015-02-09 MED ORDER — VANCOMYCIN HCL 10 G IV SOLR
1250.0000 mg | Freq: Two times a day (BID) | INTRAVENOUS | Status: DC
  Administered 2015-02-09 – 2015-02-12 (×7): 1250 mg via INTRAVENOUS
  Filled 2015-02-09 (×8): qty 1250

## 2015-02-09 MED ORDER — PROPOFOL 10 MG/ML IV EMUL
5.0000 ug/kg/min | Freq: Once | INTRAVENOUS | Status: AC
Start: 2015-02-09 — End: 2015-02-10
  Administered 2015-02-09: 5 ug/kg/min via INTRAVENOUS
  Administered 2015-02-10: 30 ug/kg/min via INTRAVENOUS
  Filled 2015-02-09: qty 100

## 2015-02-09 MED ORDER — SODIUM CHLORIDE 0.9 % IV SOLN
Freq: Once | INTRAVENOUS | Status: AC
  Administered 2015-02-09: 12:00:00 via INTRAVENOUS

## 2015-02-09 MED ORDER — SUCCINYLCHOLINE CHLORIDE 20 MG/ML IJ SOLN
INTRAMUSCULAR | Status: AC
  Filled 2015-02-09: qty 1

## 2015-02-09 MED ORDER — PIPERACILLIN-TAZOBACTAM 3.375 G IVPB 30 MIN
3.3750 g | Freq: Once | INTRAVENOUS | Status: AC
  Administered 2015-02-09: 3.375 g via INTRAVENOUS
  Filled 2015-02-09: qty 50

## 2015-02-09 MED ORDER — METOPROLOL TARTRATE 25 MG PO TABS: 12.5000 mg | ORAL_TABLET | Freq: Two times a day (BID) | ORAL | Status: DC

## 2015-02-09 MED ORDER — MOMETASONE FURO-FORMOTEROL FUM 100-5 MCG/ACT IN AERO
2.0000 | INHALATION_SPRAY | Freq: Two times a day (BID) | RESPIRATORY_TRACT | Status: DC
  Administered 2015-02-12 – 2015-02-14 (×4): 2 via RESPIRATORY_TRACT
  Filled 2015-02-09: qty 8.8

## 2015-02-09 MED ORDER — FAMOTIDINE IN NACL 20-0.9 MG/50ML-% IV SOLN
INTRAVENOUS | Status: AC
Start: 2015-02-09 — End: 2015-02-09
  Filled 2015-02-09: qty 50

## 2015-02-09 MED ORDER — PIPERACILLIN-TAZOBACTAM 3.375 G IVPB
INTRAVENOUS | Status: AC
  Filled 2015-02-09: qty 100

## 2015-02-09 MED ORDER — SODIUM CHLORIDE 0.9 % IJ SOLN
10.0000 mL | INTRAMUSCULAR | Status: DC | PRN
  Administered 2015-02-09: 20 mL
  Filled 2015-02-09: qty 40

## 2015-02-09 MED ORDER — FAMOTIDINE IN NACL 20-0.9 MG/50ML-% IV SOLN
20.0000 mg | INTRAVENOUS | Status: DC
  Administered 2015-02-09 – 2015-02-11 (×3): 20 mg via INTRAVENOUS
  Filled 2015-02-09 (×3): qty 50

## 2015-02-09 MED ORDER — SODIUM CHLORIDE 0.9 % IV SOLN
INTRAVENOUS | Status: DC
  Administered 2015-02-09 – 2015-02-13 (×3): via INTRAVENOUS

## 2015-02-09 MED ORDER — METHYLPREDNISOLONE SODIUM SUCC 125 MG IJ SOLR
125.0000 mg | Freq: Four times a day (QID) | INTRAMUSCULAR | Status: DC
  Administered 2015-02-09 – 2015-02-12 (×12): 125 mg via INTRAVENOUS
  Filled 2015-02-09 (×12): qty 2

## 2015-02-09 NOTE — ED Provider Notes (Signed)
CSN: [id number]     Arrival date & time 02/09/15  0831 History  This chart was scribed for Virgel Manifold, MD by Dellis Filbert, ED Scribe. The patient was seen in APA02/APA02 and the patient's care was started at 8:41 AM.  Chief Complaint  Patient presents with  . Shortness of Breath    Patient is a 74 y.o. female presenting with shortness of breath. The history is provided by the patient. No language interpreter was used.  Shortness of Breath Severity:  Severe Onset quality:  Sudden Duration:  1 hour Progression:  Partially resolved Chronicity:  New Relieved by:  Oxygen Associated symptoms: cough   Associated symptoms: no abdominal pain     HPI Comments: [name] is a 74 y.o. female brought in by ambulance, who presents to the Emergency Department complaining for acute respiratory failure. Per staff at at [place] her condition rapidly declined since last night. She also has a productive cough with green mucous, nausea and drainage from right eye which is also swollen. Pt is usually on 2L/min oxygen at home. Pt is not in pain anywhere else. She does wear a hearing aid.   Past Medical History  Diagnosis Date  . Cerebral palsy   . Asthma   . Seasonal allergies   . Thyroid disease   . HTN (hypertension)   . Anxiety   . Arthritis   . Breast cancer     Right breast, infiltrating ductal.  . Anginal pain   . Osteoporosis 05/08/2012  . Osteoporosis 05/08/2012  . Stroke   . Angina at rest   . Chronic steroid use   . Pneumonia 09/2013  . Dysphagia   . Hypopotassemia   . Respiratory failure   . Generalized weakness   . Urinary retention   . Dysphagia   . Acute MI   . Hypopotassemia   . Sepsis   . Neurogenic bladder   . Reflux   . Obesity   . Staph infection January 2016  . Clostridium difficile infection January 2016  . Acute respiratory failure    Past Surgical History  Procedure Laterality Date  . Breast lumpectomy    . Radical abdominal hysterectomy    .  Dental extraction    . Right hand surgery     Family History  Problem Relation Age of Onset  . Cancer    . Diabetes    . Arthritis    . Asthma    . Heart attack Father   . Heart attack Mother    History  Substance Use Topics  . Smoking status: Never Smoker   . Smokeless tobacco: Never Used  . Alcohol Use: No   OB History    No data available     Review of Systems  Eyes: Positive for discharge and redness.  Respiratory: Positive for cough and shortness of breath.   Gastrointestinal: Negative for abdominal pain.  All other systems reviewed and are negative.     Allergies  Latex and Naproxen  Home Medications   Prior to Admission medications   Medication Sig Start Date End Date Taking? Authorizing Provider  acetaminophen (TYLENOL) 325 MG tablet Take 2 tablets (650 mg total) by mouth every 6 (six) hours as needed for mild pain (or Fever >/= 101). 01/04/14   Alonza Bogus, MD  albuterol (PROVENTIL HFA;VENTOLIN HFA) 108 (90 BASE) MCG/ACT inhaler Inhale 2 puffs into the lungs every 4 (four) hours as needed. Shortness of breath 06/13/13   Jasper Loser  Luan Pulling, MD  albuterol (PROVENTIL) (2.5 MG/3ML) 0.083% nebulizer solution Take 2.5 mg by nebulization 4 (four) times daily.    Historical Provider, MD  ALPRAZolam [name]) 1 MG tablet Take 1 mg by mouth 4 (four) times daily.     Historical Provider, MD  aspirin 325 MG tablet Take 1 tablet (325 mg total) by mouth daily. 06/13/13   Alonza Bogus, MD  atorvastatin (LIPITOR) 40 MG tablet Take 40 mg by mouth daily.    Historical Provider, MD  baclofen (LIORESAL) 10 MG tablet Take 5 mg by mouth 3 (three) times daily.    Historical Provider, MD  cholecalciferol (VITAMIN D) 1000 UNITS tablet Take 1,000 Units by mouth daily.    Historical Provider, MD  clopidogrel (PLAVIX) 75 MG tablet Take 1 tablet (75 mg total) by mouth daily with breakfast. 01/04/14   Alonza Bogus, MD  Cranberry 450 MG CAPS Take 1 capsule by mouth daily.    Historical  Provider, MD  feeding supplement, GLUCERNA SHAKE, (GLUCERNA SHAKE) LIQD Take 237 mLs by mouth 2 (two) times daily between meals. 01/04/15   Alonza Bogus, MD  furosemide (LASIX) 20 MG tablet Take 2 tablets (40 mg total) by mouth daily. 01/04/15   Alonza Bogus, MD  guaiFENesin (MUCINEX) 600 MG 12 hr tablet Take 600 mg by mouth 2 (two) times daily.    Historical Provider, MD  haloperidol (HALDOL) 0.5 MG tablet Take 0.5 mg by mouth 2 (two) times daily.     Historical Provider, MD  insulin aspart (NOVOLOG) 100 UNIT/ML injection Inject 0-20 Units into the skin 3 (three) times daily with meals. 01/04/15   Alonza Bogus, MD  levothyroxine (SYNTHROID, LEVOTHROID) 88 MCG tablet Take 1 tablet (88 mcg total) by mouth daily before breakfast. 06/13/13   Alonza Bogus, MD  loperamide (IMODIUM) 2 MG capsule Take 1 capsule (2 mg total) by mouth as needed for diarrhea or loose stools. 06/17/14   Alonza Bogus, MD  loratadine (CLARITIN) 10 MG tablet Take 10 mg by mouth daily.    Historical Provider, MD  metoprolol tartrate (LOPRESSOR) 25 MG tablet Take 0.5 tablets (12.5 mg total) by mouth 2 (two) times daily. 02/05/15   Herminio Commons, MD  nitroGLYCERIN (NITROSTAT) 0.4 MG SL tablet Place 1 tablet (0.4 mg total) under the tongue every 5 (five) minutes as needed for chest pain. 06/13/13   Alonza Bogus, MD  pantoprazole (PROTONIX) 40 MG tablet Take 1 tablet (40 mg total) by mouth daily. 06/13/13   Alonza Bogus, MD  polyethylene glycol [facility] Floria Raveling) packet Take 17 g by mouth daily.    Historical Provider, MD  potassium chloride SA (K-DUR,KLOR-CON) 20 MEQ tablet Take 2 tablets (40 mEq total) by mouth 2 (two) times daily. 01/04/15   Alonza Bogus, MD  predniSONE (DELTASONE) 20 MG tablet Take 2 tablets (40 mg total) by mouth daily with breakfast. 01/04/15   Alonza Bogus, MD  ranolazine (RANEXA) 500 MG 12 hr tablet Take 1 tablet (500 mg total) by mouth 2 (two) times daily. 11/28/14   Herminio Commons, MD  saccharomyces boulardii (FLORASTOR) 250 MG capsule Take 250 mg by mouth 2 (two) times daily.    Historical Provider, MD  sertraline (ZOLOFT) 100 MG tablet Take 1 tablet (100 mg total) by mouth daily. 08/13/13   Alonza Bogus, MD  temazepam (RESTORIL) 7.5 MG capsule Take 7.5 mg by mouth at bedtime as needed for sleep.  Historical Provider, MD  traMADol (ULTRAM) 50 MG tablet Take 50 mg by mouth every 6 (six) hours as needed for moderate pain.    Historical Provider, MD   BP 119/63 mmHg  Pulse 114  Temp(Src) 99.8 F (37.7 C) (Rectal)  Resp 38  Wt 214 lb (97.07 kg)  SpO2 98% Physical Exam  Constitutional: She appears well-developed and well-nourished. No distress.  In respiratory distress  HENT:  Head: Normocephalic and atraumatic.  Eyes: Conjunctivae are normal. Right eye exhibits no discharge. Left eye exhibits no discharge.  Neck: Neck supple.  Cardiovascular: Normal rate, regular rhythm and normal heart sounds.  Exam reveals no gallop and no friction rub.   No murmur heard. Pulmonary/Chest: Effort normal and breath sounds normal. No respiratory distress.  Tachypnea, accessory muscle usage. Decreased breath sounds bilaterally  Abdominal: Soft. She exhibits no distension. There is no tenderness.  Musculoskeletal: She exhibits no edema or tenderness.  Neurological: She is alert.  Follows simple commands.  Skin: Skin is warm and dry.  Psychiatric: She has a normal mood and affect. Her behavior is normal. Thought content normal.  Nursing note and vitals reviewed.   ED Course  OG placement Date/Time: 02/09/2015 7:06 PM Performed by: Virgel Manifold Authorized by: Virgel Manifold Consent: The procedure was performed in an emergent situation. Patient identity confirmed: verbally with patient, arm band and provided demographic data Local anesthesia used: no Patient sedated: yes Vitals: Vital signs were monitored during sedation. Patient tolerance: Patient tolerated  the procedure well with no immediate complications     INTUBATION Performed by: Virgel Manifold  Required items: required blood products, implants, devices, and special equipment available Patient identity confirmed: provided demographic data and hospital-assigned identification number Time out: Immediately prior to procedure a "time out" was called to verify the correct patient, procedure, equipment, support staff and site/side marked as required.  Indications: respiratory failure  Intubation method: Glidescope Laryngoscopy   Preoxygenation: BVM  Sedatives: Etomidate Paralytic: rocuronium  Tube Size: 7.5 cuffed  Post-procedure assessment: chest rise and ETCO2 monitor Breath sounds: equal and absent over the epigastrium Tube secured with: ETT holder Chest x-ray interpreted by radiologist and me.  Chest x-ray findings: endotracheal tube in appropriate position  Patient tolerated the procedure well with no immediate complications.  CRITICAL CARE Performed by: Virgel Manifold Total critical care time: 60 minutes Critical care time was exclusive of separately billable procedures and treating other patients. Critical care was necessary to treat or prevent imminent or life-threatening deterioration. Critical care was time spent personally by me on the following activities: development of treatment plan with patient and/or surrogate as well as nursing, discussions with consultants, evaluation of patient's response to treatment, examination of patient, obtaining history from patient or surrogate, ordering and performing treatments and interventions, ordering and review of laboratory studies, ordering and review of radiographic studies, pulse oximetry and re-evaluation of patient's condition.   DIAGNOSTIC STUDIES: Oxygen Saturation is 98% on room air, normal by my interpretation.    COORDINATION OF CARE: 8:48 AM Discussed treatment plan with pt at bedside and pt agreed to  plan.   Labs Review Labs Reviewed  MRSA PCR SCREENING - Abnormal; Notable for the following:    MRSA by PCR POSITIVE (*)    All other components within normal limits  CBC WITH DIFFERENTIAL/PLATELET - Abnormal; Notable for the following:    Hemoglobin 10.7 (*)    MCHC 29.4 (*)    RDW 17.9 (*)    Platelets 139 (*)  Neutrophils Relative % 79 (*)    Lymphocytes Relative 11 (*)    All other components within normal limits  BLOOD GAS, ARTERIAL - Abnormal; Notable for the following:    pH, Arterial 7.253 (*)    pCO2 arterial 90.6 (*)    pO2, Arterial 104.0 (*)    Bicarbonate 38.7 (*)    Acid-Base Excess 11.4 (*)    All other components within normal limits  BASIC METABOLIC PANEL - Abnormal; Notable for the following:    CO2 38 (*)    Glucose, Bld 133 (*)    Creatinine, Ser 0.31 (*)    Calcium 7.7 (*)    Anion gap 2 (*)    All other components within normal limits  BRAIN NATRIURETIC PEPTIDE - Abnormal; Notable for the following:    B Natriuretic Peptide 240.0 (*)    All other components within normal limits  URINALYSIS, ROUTINE W REFLEX MICROSCOPIC - Abnormal; Notable for the following:    Color, Urine AMBER (*)    APPearance CLOUDY (*)    Specific Gravity, Urine >1.030 (*)    Hgb urine dipstick LARGE (*)    Protein, ur 100 (*)    Nitrite POSITIVE (*)    Leukocytes, UA MODERATE (*)    All other components within normal limits  URINE MICROSCOPIC-ADD ON - Abnormal; Notable for the following:    Bacteria, UA MANY (*)    All other components within normal limits  BLOOD GAS, ARTERIAL - Abnormal; Notable for the following:    pH, Arterial 7.257 (*)    pCO2 arterial 89.3 (*)    pO2, Arterial 214.0 (*)    Bicarbonate 38.4 (*)    Acid-Base Excess 11.2 (*)    All other components within normal limits  COMPREHENSIVE METABOLIC PANEL - Abnormal; Notable for the following:    Glucose, Bld 168 (*)    Creatinine, Ser 0.34 (*)    Calcium 6.9 (*)    Total Protein 5.1 (*)    Albumin  2.4 (*)    Anion gap 2 (*)    All other components within normal limits  FIBRINOGEN - Abnormal; Notable for the following:    Fibrinogen >800 (*)    All other components within normal limits  BLOOD GAS, ARTERIAL - Abnormal; Notable for the following:    pH, Arterial 7.456 (*)    pCO2 arterial 45.1 (*)    pO2, Arterial 229.0 (*)    Bicarbonate 31.3 (*)    Acid-Base Excess 7.2 (*)    All other components within normal limits  BLOOD GAS, ARTERIAL - Abnormal; Notable for the following:    pH, Arterial 7.490 (*)    pO2, Arterial 112.0 (*)    Bicarbonate 29.8 (*)    Acid-Base Excess 6.3 (*)    All other components within normal limits  BLOOD GAS, ARTERIAL - Abnormal; Notable for the following:    pH, Arterial 7.459 (*)    pO2, Arterial 110.0 (*)    Bicarbonate 31.3 (*)    Acid-Base Excess 7.3 (*)    All other components within normal limits  CBC WITH DIFFERENTIAL/PLATELET - Abnormal; Notable for the following:    RBC 3.26 (*)    Hemoglobin 9.1 (*)    HCT 29.3 (*)    RDW 17.9 (*)    Neutrophils Relative % 88 (*)    Lymphocytes Relative 9 (*)    Lymphs Abs 0.5 (*)    All other components within normal limits  BASIC METABOLIC PANEL -  Abnormal; Notable for the following:    Potassium 3.4 (*)    Glucose, Bld 172 (*)    Calcium 6.7 (*)    All other components within normal limits  BLOOD GAS, ARTERIAL - Abnormal; Notable for the following:    Bicarbonate 28.6 (*)    Acid-Base Excess 4.7 (*)    All other components within normal limits  CBC WITH DIFFERENTIAL/PLATELET - Abnormal; Notable for the following:    RBC 3.15 (*)    Hemoglobin 8.8 (*)    HCT 28.2 (*)    RDW 18.4 (*)    Platelets 138 (*)    Neutrophils Relative % 84 (*)    Lymphocytes Relative 11 (*)    Lymphs Abs 0.6 (*)    All other components within normal limits  BASIC METABOLIC PANEL - Abnormal; Notable for the following:    Potassium 2.9 (*)    Glucose, Bld 170 (*)    Creatinine, Ser 0.34 (*)    Calcium 6.5 (*)     All other components within normal limits  BLOOD GAS, ARTERIAL - Abnormal; Notable for the following:    Bicarbonate 28.5 (*)    Acid-Base Excess 4.2 (*)    All other components within normal limits  BLOOD GAS, ARTERIAL - Abnormal; Notable for the following:    pCO2 arterial 46.0 (*)    Bicarbonate 27.7 (*)    Acid-Base Excess 3.2 (*)    All other components within normal limits  TRIGLYCERIDES - Abnormal; Notable for the following:    Triglycerides 229 (*)    All other components within normal limits  GLUCOSE, CAPILLARY - Abnormal; Notable for the following:    Glucose-Capillary 165 (*)    All other components within normal limits  CBC WITH DIFFERENTIAL/PLATELET - Abnormal; Notable for the following:    WBC 11.3 (*)    RBC 3.70 (*)    Hemoglobin 10.1 (*)    HCT 33.5 (*)    RDW 19.1 (*)    Neutrophils Relative % 90 (*)    Lymphocytes Relative 5 (*)    Neutro Abs 10.1 (*)    Lymphs Abs 0.6 (*)    All other components within normal limits  BASIC METABOLIC PANEL - Abnormal; Notable for the following:    Potassium 2.9 (*)    Chloride 115 (*)    Glucose, Bld 234 (*)    Creatinine, Ser 0.49 (*)    Calcium 6.1 (*)    Anion gap 4 (*)    All other components within normal limits  CALCIUM, IONIZED - Abnormal; Notable for the following:    Calcium, Ion 0.74 (*)    All other components within normal limits  CBC WITH DIFFERENTIAL/PLATELET - Abnormal; Notable for the following:    RBC 3.39 (*)    Hemoglobin 9.2 (*)    HCT 30.9 (*)    MCHC 29.8 (*)    RDW 19.0 (*)    Neutrophils Relative % 80 (*)    Lymphocytes Relative 10 (*)    All other components within normal limits  COMPREHENSIVE METABOLIC PANEL - Abnormal; Notable for the following:    Sodium 146 (*)    Potassium 3.3 (*)    CO2 34 (*)    Glucose, Bld 135 (*)    Creatinine, Ser 0.33 (*)    Calcium 6.0 (*)    Total Protein 4.8 (*)    Albumin 2.5 (*)    All other components within normal limits  GLUCOSE, CAPILLARY -  Abnormal; Notable for the following:    Glucose-Capillary 149 (*)    All other components within normal limits  URINE CULTURE  CULTURE, BLOOD (ROUTINE X 2)  CULTURE, BLOOD (ROUTINE X 2)  URINE CULTURE  CULTURE, RESPIRATORY (NON-EXPECTORATED)  CLOSTRIDIUM DIFFICILE BY PCR  TROPONIN I  PROCALCITONIN  LACTIC ACID, PLASMA  LACTIC ACID, PLASMA  LEGIONELLA ANTIGEN, URINE  STREP PNEUMONIAE URINARY ANTIGEN  LACTIC ACID, PLASMA  LACTIC ACID, PLASMA  CORTISOL  TROPONIN I  PROTIME-INR  APTT  PROCALCITONIN  PROCALCITONIN  VANCOMYCIN, TROUGH  MAGNESIUM  BASIC METABOLIC PANEL  CBC WITH DIFFERENTIAL/PLATELET  I-STAT CG4 LACTIC ACID, ED  I-STAT CG4 LACTIC ACID, ED  TYPE AND SCREEN    Imaging Review Dg Chest Port 1 View  02/12/2015   CLINICAL DATA:  Hypoxia, respiratory failure  EXAM: PORTABLE CHEST - 1 VIEW  COMPARISON:  02/11/2015  FINDINGS: Left PICC line tip is in the lower right atrium, approximately 9 cm below the cavoatrial junction. There is cardiomegaly with vascular congestion. Bibasilar opacities likely reflect atelectasis. No visible significant effusion.  IMPRESSION: Left PICC line tip in the lower right atrium approximately 9 cm below the cavoatrial junction.  Cardiomegaly, vascular congestion.  Bibasilar atelectasis.   Electronically Signed   By: Rolm Baptise M.D.   On: 02/12/2015 08:32     EKG Interpretation   Date/Time:  Sunday February 09 2015 14:50:09 EST Ventricular Rate:  89 PR Interval:  138 QRS Duration: 80 QT Interval:  372 QTC Calculation: 452 R Axis:   91 Text Interpretation:  Normal sinus rhythm Rightward axis Borderline ECG ED  PHYSICIAN INTERPRETATION AVAILABLE IN [facility] Confirmed by TEST,  Record ([id number]) on 02/11/2015 8:04:01 AM      MDM   Final diagnoses:  Acute on chronic respiratory failure with hypercapnia  HCAP (healthcare-associated pneumonia)  UTI (lower urinary tract infection)   73yF with acute on chronic respiratory failure.  Little improvement in gas or mental status. Intubated. Admit. R subclavian line attempted w/o success. BP improving though. Has two peripheral lines. Further attempts deferred.      Virgel Manifold, MD 02/13/15 1910

## 2015-02-09 NOTE — ED Notes (Signed)
ABG resulted around 1115, since then respiratory as paged Dr. Verlon Au twice, and I have paged him twice with no return call.

## 2015-02-09 NOTE — ED Notes (Signed)
CRITICAL VALUE ALERT  Critical value received:  Blood Gas   PH 7.25  PCO2 90.6 PO2 104  Date of notification:  02/09/2015  Time of notification:  0947  Critical value read back:Yes.    Nurse who received alert:  Lana Fish  MD notified (1st page):  Dr Wilson Singer  Time of first page:  [phone]  MD notified (2nd page):  Time of second page:  Responding MD:  Dr Wilson Singer  Time MD responded:  [phone]

## 2015-02-09 NOTE — Progress Notes (Signed)
Pt placed on BIPAP per MD order.  Pt tolerating well at this time.  RT will continue to monitor.  

## 2015-02-09 NOTE — Progress Notes (Signed)
Pt intubated by MD.  Positive CO2 color change and equal bilateral breath sounds.  ET tube taped and secured.  Pt placed on vent. RT will continue to monitor.

## 2015-02-09 NOTE — Progress Notes (Signed)
ANTIBIOTIC CONSULT NOTE - INITIAL  Pharmacy Consult for Vancomycin Indication: pneumonia  Allergies  Allergen Reactions  . Latex     Provided via MAR  . Naproxen Other (See Comments)    Unknown-Provided via MAR    Patient Measurements: Weight: 214 lb (97.07 kg) Adjusted Body Weight:   Vital Signs: Temp: 99.8 F (37.7 C) (02/21 0845) Temp Source: Rectal (02/21 0845) BP: 95/67 mmHg (02/21 1028) Pulse Rate: 104 (02/21 1028) Intake/Output from previous day:   Intake/Output from this shift: Total I/O In: -  Out: 23 [Urine:23]  Labs:  Recent Labs  02/09/15 0830  WBC 7.5  HGB 10.7*  PLT 139*  CREATININE 0.31*   Estimated Creatinine Clearance: 70.9 mL/min (by C-G formula based on Cr of 0.31). No results for input(s): VANCOTROUGH, VANCOPEAK, VANCORANDOM, GENTTROUGH, GENTPEAK, GENTRANDOM, TOBRATROUGH, TOBRAPEAK, TOBRARND, AMIKACINPEAK, AMIKACINTROU, AMIKACIN in the last 72 hours.   Microbiology: No results found for this or any previous visit (from the past 720 hour(s)).  Medical History: Past Medical History  Diagnosis Date  . Cerebral palsy   . Asthma   . Seasonal allergies   . Thyroid disease   . HTN (hypertension)   . Anxiety   . Arthritis   . Breast cancer     Right breast, infiltrating ductal.  . Anginal pain   . Osteoporosis 05/08/2012  . Osteoporosis 05/08/2012  . Stroke   . Angina at rest   . Chronic steroid use   . Pneumonia 09/2013  . Dysphagia   . Hypopotassemia   . Respiratory failure   . Generalized weakness   . Urinary retention   . Dysphagia   . Acute MI   . Hypopotassemia   . Sepsis   . Neurogenic bladder   . Reflux   . Obesity   . Staph infection January 2016  . Clostridium difficile infection January 2016  . Acute respiratory failure     Medications:   (Not in a hospital admission) Assessment: 74 y.o. female brought in by ambulance, who presents to the Emergency Department complaining for acute respiratory failure. Per staff  at at [place] her condition rapidly declined since last night. She also has a productive cough with green mucous, nausea and drainage from right eye which is also swollen. Zosyn 3.375 GM IV given in ED   Goal of Therapy:  Vancomycin trough level 15-20 mcg/ml  Plan:  Vancomycin 1500 mg IV loading dose in ED, then Vancomycin 1250 mg IV every 12 hours Vancomycin trough at steady state Monitor renal function Labs per protocol  Abner Greenspan, Natania Finigan Bennett 02/09/2015,11:17 AM

## 2015-02-09 NOTE — ED Notes (Signed)
Spoke with Dr. Verlon Au, abg results given. States he will talk to Dr.  Singer regarding patients plan of care.

## 2015-02-09 NOTE — ED Notes (Signed)
Respiratory paged for bipap

## 2015-02-09 NOTE — ED Notes (Signed)
Patient's blood pressure 87/53. Dr Wilson Singer made aware-order given. Nursing supervisor made aware patient does need picc line.

## 2015-02-09 NOTE — Progress Notes (Signed)
MD asked to be paged with CVP once set up.  CVP=14. Paged result to MD.  No return phone call at this time. Schonewitz, Eulis Canner 02/09/2015

## 2015-02-09 NOTE — Significant Event (Addendum)
   Patient reassessed still somnolent Current vitals show slight improvement in heart rate, map 60, blood pressure responding to 50 cc per hour ABG essentially unchanged after 1 hr  Long discussion with sister at bedside who understands gravity of situation. She reiterates that patient would not have wanted long-term mechanical ventilation but is agreeable to short term ventilation to see if situation improves and patient can be weaned off of this rapidly. We will honor the surrogate wishes based on sister's discussion with me  I have discussed patient's care with Dr. Wilson Singer of the emergency room and I greatly appreciate his assistance in obtaining central venous access as well as intubation Might prefer BZD's for sedation given chronic use in the past/prevent withdrawal.  Patient to be transported to ICU subsequently  Verneita Griffes, MD [facility] (P) [phone]

## 2015-02-09 NOTE — Progress Notes (Signed)
Peripherally Inserted Central Catheter/Midline Placement  The IV Nurse has discussed with the patient and/or persons authorized to consent for the patient, the purpose of this procedure and the potential benefits and risks involved with this procedure.  The benefits include less needle sticks, lab draws from the catheter and patient may be discharged home with the catheter.  Risks include, but not limited to, infection, bleeding, blood clot (thrombus formation), and puncture of an artery; nerve damage and irregular heat beat.  Alternatives to this procedure were also discussed.  PICC/Midline Placement Documentation  PICC / Midline Double Lumen 81/01/75 PICC Left Basilic 48 cm 0 cm (Active)  Indication for Insertion or Continuance of Line Chronic illness with exacerbations (CF, Sickle Cell, etc.);Limited venous access - need for IV therapy >5 days (PICC only);Poor Vasculature-patient has had multiple peripheral attempts or PIVs lasting less than 24 hours 02/09/2015  6:00 PM  Exposed Catheter (cm) 0 cm 02/09/2015  6:00 PM  Site Assessment Clean;Dry;Intact 02/09/2015  6:00 PM  Lumen #1 Status Flushed;Saline locked;Capped (Central line);Blood return noted 02/09/2015  6:00 PM  Lumen #2 Status Flushed;Saline locked;Capped (Central line);Blood return noted 02/09/2015  6:00 PM  Dressing Type Transparent 02/09/2015  6:00 PM  Dressing Status Clean;Dry;Intact 02/09/2015  6:00 PM  Dressing Change Due 02/16/15 02/09/2015  6:00 PM       Roselind Messier 02/09/2015, 6:33 PM

## 2015-02-09 NOTE — ED Notes (Signed)
Report called to [facility] in ICU, all questions answered.

## 2015-02-09 NOTE — ED Notes (Signed)
Resp paged for blood gas per hospitalist.

## 2015-02-09 NOTE — ED Notes (Signed)
Myself, Dr.  Singer and Janett Billow, RT in room. Amidate 20mg  IV and Rocuronium 80mg  IV given by myself per Dr. Lum Keas orders.   #8 ett placed at 1220 25 cm at the lip by Dr.  Singer, no difficulty pts stats remained 98-100% throughout intubation process. # 16 OG inserted by Dr.  Singer at 1222. Dr.  Singer currently in process of inserting central line. NAD. VSS

## 2015-02-09 NOTE — Progress Notes (Signed)
eLink Physician-Brief Progress Note Patient Name: [name] DOB: [birth year] MRN: [id number]   Date of Service  02/09/2015  HPI/Events of Note  Debilitated 74 yr old female non ambulatory and on chronic oxygen admitted with hypercarbic resp failure and new left infiltrate.  She was not febrile and had normal WBC on presentation.  Lactic acid normal.  Patient required intubation.  BP has been borderline.  She is sedated on propofol drip.    eICU Interventions  Decrease propofol as able Start levophed as needed to maintain MAP of 65 Add additional antimicrobial coverage to Vanc     Intervention Category Evaluation Type: New Patient Evaluation  Mauri Brooklyn, P 02/09/2015, 7:44 PM

## 2015-02-09 NOTE — Progress Notes (Signed)
Decreasing rate to 16, so as to decrease blood PH, per last ABG.  Will continue to monitor repeat ABG in a few hours.

## 2015-02-09 NOTE — H&P (Signed)
[name] History and Physical  [name] KWI:[id number] DOB: December 01, 1941 DOA: 02/09/2015  Referring physician: ED PCP: Alonza Bogus, MD  Specialists: Pulmonary  Chief Complaint: SOB, Fever  HPI:  74 y/o ? 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 Apparently her last hospitalization was significant for: Shirlee Limerick negative staph, yeast UTI, C. difficile colitis Patient's functional baseline she can feed herself-she has not walked since 2014 when she was admitted to skilled nursing facility. She has had spastic cerebral palsy since birth History is incomplete as patient is not able to give me full recollection of events as she is on BiPAP and is sleepy.  Information gathered from sister who is historian and EDP note is that she started having productive cough with green mucus nausea and drainage and this rapidly worsened overnight. Sister saw the patient last on 2/19 and patient was having a bad cold at the time  Temperature at bedside was 99 rectally, First ABG 7.25/90.6/104 on nonrebreather Patient subsequently placed on BiPAP  WBC 7.5 1110.7 platelets 139 CO2 38 anion gap 2 ProBNP 240 Point-of-care troponin less than 0.03 Lactic acid 0.83 EKG sinus tachycardia PR interval 0 point 08 QRS axis right axis deviation, no ST-T wave changes Pro calcitonin pending  Review of Systems: Unobtainable   Past Medical History  Diagnosis Date  . Cerebral palsy   . Asthma   . Seasonal allergies   . Thyroid disease   . HTN (hypertension)   . Anxiety   . Arthritis   . Breast cancer     Right  breast, infiltrating ductal.  . Anginal pain   . Osteoporosis 05/08/2012  . Osteoporosis 05/08/2012  . Stroke   . Angina at rest   . Chronic steroid use   . Pneumonia 09/2013  . Dysphagia   . Hypopotassemia   . Respiratory failure   . Generalized weakness   . Urinary retention   . Dysphagia   . Acute MI   . Hypopotassemia   . Sepsis   . Neurogenic bladder   . Reflux   . Obesity   . Staph infection January 2016  . Clostridium difficile infection January 2016  . Acute respiratory failure    Past Surgical History  Procedure Laterality Date  . Breast lumpectomy    . Radical abdominal hysterectomy    . Dental extraction    . Right hand surgery     Social History:  History   Social History Narrative   Known history cerebral palsy-spastic type   Used to live at home until 2014 and declined further therefore transferred to nursing facility 2014   Sisters next of kin    Allergies  Allergen Reactions  . Latex     Provided via MAR  . Naproxen Other (See Comments)    Unknown-Provided via MAR    Family History  Problem Relation Age of Onset  . Cancer    . Diabetes    . Arthritis    . Asthma    . Heart attack Father   . Heart attack Mother     Prior to Admission medications   Medication Sig Start Date End Date Taking? Authorizing Provider  acetaminophen (TYLENOL) 325 MG tablet Take 2 tablets (650 mg total) by mouth every 6 (six) hours as needed for mild pain (or Fever >/= 101). 01/04/14   Alonza Bogus, MD  albuterol (PROVENTIL HFA;VENTOLIN HFA) 108 (90 BASE) MCG/ACT inhaler Inhale 2 puffs into the lungs every 4 (four) hours as needed. Shortness of breath 06/13/13   Alonza Bogus, MD  albuterol (PROVENTIL) (2.5 MG/3ML) 0.083% nebulizer solution Take 2.5 mg by nebulization 4 (four) times daily.    Historical Provider, MD  ALPRAZolam [name]) 1 MG tablet Take 1 mg by mouth 4 (four) times daily.     Historical Provider, MD  aspirin 325 MG tablet Take 1 tablet (325 mg  total) by mouth daily. 06/13/13   Alonza Bogus, MD  atorvastatin (LIPITOR) 40 MG tablet Take 40 mg by mouth daily.    Historical Provider, MD  baclofen (LIORESAL) 10 MG tablet Take 5 mg by mouth 3 (three) times daily.    Historical Provider, MD  cholecalciferol (VITAMIN D) 1000 UNITS tablet Take 1,000 Units by mouth daily.    Historical Provider, MD  clopidogrel (PLAVIX) 75 MG tablet Take 1 tablet (75 mg total) by mouth daily with breakfast. 01/04/14   Alonza Bogus, MD  Cranberry 450 MG CAPS Take 1 capsule by mouth daily.    Historical Provider, MD  feeding supplement, GLUCERNA SHAKE, (GLUCERNA SHAKE) LIQD Take 237 mLs by mouth 2 (two) times daily between meals. 01/04/15   Alonza Bogus, MD  furosemide (LASIX) 20 MG tablet Take 2 tablets (40 mg total) by mouth daily. 01/04/15   Alonza Bogus, MD  guaiFENesin (MUCINEX) 600 MG 12 hr tablet Take 600 mg by mouth 2 (two) times daily.    Historical Provider, MD  haloperidol (HALDOL) 0.5 MG tablet Take 0.5 mg by mouth 2 (two) times daily.     Historical Provider, MD  insulin aspart (NOVOLOG) 100 UNIT/ML injection Inject 0-20 Units into the skin 3 (three) times daily with meals. 01/04/15   Alonza Bogus, MD  levothyroxine (SYNTHROID, LEVOTHROID) 88 MCG tablet Take 1 tablet (88 mcg total) by mouth daily before breakfast. 06/13/13   Alonza Bogus, MD  loperamide (IMODIUM) 2 MG capsule Take 1 capsule (2 mg total) by mouth as needed for diarrhea or loose stools. 06/17/14   Alonza Bogus, MD  loratadine (CLARITIN) 10 MG tablet Take 10 mg by mouth daily.    Historical Provider, MD  metoprolol tartrate (LOPRESSOR) 25 MG tablet Take 0.5 tablets (12.5 mg total) by mouth 2 (two) times daily. 02/05/15   Herminio Commons, MD  nitroGLYCERIN (NITROSTAT) 0.4 MG SL tablet Place 1 tablet (0.4 mg total) under the tongue every 5 (five) minutes as needed for chest pain. 06/13/13   Alonza Bogus, MD  pantoprazole (PROTONIX) 40 MG tablet Take 1 tablet (40 mg  total) by mouth daily. 06/13/13   Alonza Bogus, MD  polyethylene glycol [facility] / Floria Raveling) packet Take 17 g by mouth daily.    Historical Provider, MD  potassium chloride SA (K-DUR,KLOR-CON) 20 MEQ tablet Take 2 tablets (40 mEq total) by mouth 2 (two) times daily. 01/04/15   Alonza Bogus, MD  predniSONE (DELTASONE) 20 MG tablet Take 2 tablets (40 mg total) by mouth daily with breakfast. 01/04/15   Alonza Bogus, MD  ranolazine (RANEXA) 500 MG 12 hr tablet Take 1 tablet (500 mg total) by mouth 2 (two) times daily. 11/28/14   Herminio Commons, MD  saccharomyces boulardii (FLORASTOR) 250 MG capsule Take 250 mg by mouth 2 (two) times daily.    Historical Provider, MD  sertraline (ZOLOFT) 100 MG tablet Take 1 tablet (100 mg total) by mouth daily. 08/13/13   Alonza Bogus, MD  temazepam (RESTORIL) 7.5 MG capsule Take 7.5 mg by mouth at bedtime as needed for sleep.    Historical Provider, MD  traMADol (ULTRAM) 50 MG tablet Take 50 mg by mouth every 6 (six) hours as needed for moderate pain.    Historical Provider, MD   Physical Exam: Filed Vitals:   02/09/15 0845 02/09/15 0935  BP: 119/63   Pulse: 114 110  Temp: 99.8 F (37.7 C)   TempSrc: Rectal   Resp: 38 26  Weight: 97.07 kg (214 lb)   SpO2: 98% 100%    Patient sleepy but arousable on BiPAP. Unable to meaningfully discuss care with me or symptoms Pupils equally reactive to light-poor exam mouth and ENT secondary to mask S1-S2 tachycardic Poor respiratory exam secondary to being on BiPAP Abdomen soft nontender nondistended no rebound no guarding Cranial nerves are grossly intact 2 menace and noxious stimuli GCS e=3, V=2, M=4-->8/15 Multiple skin tears on left bottom and denudation-intertriginous MASD in groin  No lower extremity edema Musculoskeletal exam significant for his flexure contractures of the bilateral lower extremities  Labs on Admission:  Basic Metabolic Panel:  Recent Labs Lab 02/09/15 0830  NA 141  K  4.6  CL 101  CO2 38*  GLUCOSE 133*  BUN 11  CREATININE 0.31*  CALCIUM 7.7*   Liver Function Tests: No results for input(s): AST, ALT, ALKPHOS, BILITOT, PROT, ALBUMIN in the last 168 hours. No results for input(s): LIPASE, AMYLASE in the last 168 hours. No results for input(s): AMMONIA in the last 168 hours. CBC:  Recent Labs Lab 02/09/15 0830  WBC 7.5  NEUTROABS 5.9  HGB 10.7*  HCT 36.4  MCV 92.9  PLT 139*   Cardiac Enzymes:  Recent Labs Lab 02/09/15 0830  TROPONINI <0.03    BNP (last 3 results)  Recent Labs  12/26/14 0916 01/05/15 1445 02/09/15 0830  BNP 107.0* 112.0* 240.0*    ProBNP (last 3 results)  Recent Labs  06/13/14 2007 11/12/14 2100  PROBNP 172.5* 434.4*    CBG: No results for input(s): GLUCAP in the last 168 hours.  Radiological Exams on Admission: Dg Chest Portable 1 View  02/09/2015   CLINICAL DATA:  Shortness of breath. Acute respiratory failure and productive cough.  EXAM: PORTABLE CHEST - 1 VIEW  COMPARISON:  01/08/2015  FINDINGS: Heart size is enlarged, unchanged from previous exam. There is diffuse pulmonary vascular congestion. Persistent right lung base opacity which may represent pneumonia or atelectasis.  IMPRESSION: 1. No change in aeration to the right lung base.   Electronically Signed   By: Kerby Moors M.D.   On: 02/09/2015 09:22    EKG: Independently reviewed. See above     Sepsis in relatively immunocompromised host- etiology = Aspiration in setti1ng recent Hospitalization with CDIFF/Yeast UTI-sepsis bundle. Trend Pro calcitonin, lactic acid. Empiric vancomycin/Zosyn-->rapid taper secondary to recent C. Difficile. She will eventually need a dysphagia 3 diet as per speech therapy's last evaluation.  For completion sake obtained blood culture/urine culture/respiratory culture/Legionella antigen/strep pneumo.  Her skin wounds do not appear infected  currently although she will need wound nurse consult eventually for her groin  and her back    Acute and chronic respiratory failure with hypercapnia--secondary to severe ASTHMA, -EKG my read  low lung volumes, malposition film, effusions on the right side greater than left side. ProBNP 200 range therefore potential component AE CHF?? Given however clinical presentation with sepsis, would treat aggressively with sepsis protocol and fluids. Would not diurese. Last EF 72% on Myoview 12/19/14.  We will discontinue metoprolol-close consideration as an outpatient to revisit thisPatient is not to be transferred to ICU until repeat blood gas returns--> if ABG is worsened patient will need to be intubated and placed on mechanical ventilation.  I had a long discussion with the sister who said that patient "is not sure if she would want to be intubated or on a machine for a long time"-I have encouraged her to discuss this with patient's pulmonologist/primary care physician on his return.  IV Solu-Medrol 125 every 6 hourly.  Taper based on clinical condition.    Encephalopathy, metabolic-secondary to acute/chronic hypercarbia-repeat blood gas pending. Patient also on multiple agents with propensity for sedation/worsening CO2 narcosis = baclofen 10 3 times a day , Haldol 0.5 daily, sertraline 100 daily , temazepam 7.5 daily at bedtime, tramadol 50 every 6 when necessary, Xanax 1 mg every 12 when necessary.  We will consider IV benzodiazepines to prevent withdrawal later on today.  Severe asthma-continue nebulizations with albuterol every 2 hourly, add ipratropium every 4 hourly, add mometasone. Outpatient PFTs when stable.   Recent C. difficile, yeast UTI, coagulase-negative staph-see above discussion    Infiltrating ductal carcinoma of right breast-Clinically quite sent    Hypothyroidism-Administer levothyroxine half by mouth dose of 45 g     recent NSTEMI-12/2014-hold metoprolol.  Sinus tachycardia-physiologic compensation for acute respiratory failure-IV Cardizem when necessary ordered  for heart rate greater than 130  anemia/thrombocytopenia-likely secondary to #1-monitor in a.m.   CRITICAL CARE Performed by: Nita Sells   Total critical care time: 75  Critical care time was exclusive of separately billable procedures and treating other patients.  Critical care was necessary to treat or prevent imminent or life-threatening deterioration.  Critical care was time spent personally by me on the following activities: development of treatment plan with patient and/or surrogate as well as nursing, discussions with consultants, evaluation of patient's response to treatment, examination of patient, obtaining history from patient or surrogate, ordering and performing treatments and interventions, ordering and review of laboratory studies, ordering and review of radiographic studies, pulse oximetry and re-evaluation of patient's condition.   Full CODE STATUS for now Detailed discussion with sister at bedside ICU status requested  [place], [facility] Pager [phone]  If 7PM-7AM, please contact night-coverage [email or web address] Password [facility] 02/09/2015, 10:23 AM

## 2015-02-09 NOTE — ED Notes (Signed)
Patient came in via EMS from [facility]. Patient at [facility] for acute respiratory failure. Per staff patient condition has dramatically declined since last night. Patient oxygen sat per [facility] staff 879% on 3 liters. Per paramedic O2 sat 77-82% upon their arrival. Patient placed on non-re breather with sats of 98%. CBG 151 per paramedic. [facility] staff reports drainage from right eye. Right eye notable swollen and draining. [facility] staff also reported patient has productive cough with thick green sputum and wheezing throughout all lobes.

## 2015-02-09 NOTE — ED Notes (Signed)
Hospitalist in room assessing patient. Patient to have repeat blood gas prior to admission to ICU for possible intubation. Sister aware of situatuion. ICU notified. Dr Wilson Singer aware.

## 2015-02-10 ENCOUNTER — Inpatient Hospital Stay (HOSPITAL_COMMUNITY): Payer: Medicare Other

## 2015-02-10 LAB — CBC WITH DIFFERENTIAL/PLATELET
BASOS ABS: 0 10*3/uL (ref 0.0–0.1)
Basophils Relative: 0 % (ref 0–1)
EOS PCT: 0 % (ref 0–5)
Eosinophils Absolute: 0 10*3/uL (ref 0.0–0.7)
HCT: 29.3 % — ABNORMAL LOW (ref 36.0–46.0)
Hemoglobin: 9.1 g/dL — ABNORMAL LOW (ref 12.0–15.0)
Lymphocytes Relative: 9 % — ABNORMAL LOW (ref 12–46)
Lymphs Abs: 0.5 10*3/uL — ABNORMAL LOW (ref 0.7–4.0)
MCH: 27.9 pg (ref 26.0–34.0)
MCHC: 31.1 g/dL (ref 30.0–36.0)
MCV: 89.9 fL (ref 78.0–100.0)
Monocytes Absolute: 0.2 10*3/uL (ref 0.1–1.0)
Monocytes Relative: 3 % (ref 3–12)
Neutro Abs: 4.7 10*3/uL (ref 1.7–7.7)
Neutrophils Relative %: 88 % — ABNORMAL HIGH (ref 43–77)
PLATELETS: ADEQUATE 10*3/uL (ref 150–400)
RBC: 3.26 MIL/uL — AB (ref 3.87–5.11)
RDW: 17.9 % — AB (ref 11.5–15.5)
Smear Review: ADEQUATE
WBC: 5.4 10*3/uL (ref 4.0–10.5)

## 2015-02-10 LAB — LEGIONELLA ANTIGEN, URINE

## 2015-02-10 LAB — BLOOD GAS, ARTERIAL
ACID-BASE EXCESS: 7.3 mmol/L — AB (ref 0.0–2.0)
BICARBONATE: 31.3 meq/L — AB (ref 20.0–24.0)
Drawn by: 105551
FIO2: 0.35 %
MECHVT: 440 mL
O2 SAT: 98.4 %
PEEP: 5 cmH2O
PO2 ART: 110 mmHg — AB (ref 80.0–100.0)
Patient temperature: 37
RATE: 16 resp/min
TCO2: 28.1 mmol/L (ref 0–100)
pCO2 arterial: 44.7 mmHg (ref 35.0–45.0)
pH, Arterial: 7.459 — ABNORMAL HIGH (ref 7.350–7.450)

## 2015-02-10 LAB — BASIC METABOLIC PANEL
ANION GAP: 7 (ref 5–15)
BUN: 14 mg/dL (ref 6–23)
CO2: 28 mmol/L (ref 19–32)
CREATININE: 0.55 mg/dL (ref 0.50–1.10)
Calcium: 6.7 mg/dL — ABNORMAL LOW (ref 8.4–10.5)
Chloride: 107 mmol/L (ref 96–112)
GFR calc Af Amer: >90 mL/min (ref >90)
GFR calc non Af Amer: >90 mL/min (ref >90)
GLUCOSE: 172 mg/dL — AB (ref 70–99)
Potassium: 3.4 mmol/L — ABNORMAL LOW (ref 3.5–5.1)
Sodium: 142 mmol/L (ref 135–145)

## 2015-02-10 LAB — PROCALCITONIN: Procalcitonin: <0.1 ng/mL

## 2015-02-10 MED ORDER — PROPOFOL 10 MG/ML IV EMUL
INTRAVENOUS | Status: AC
  Filled 2015-02-10: qty 200

## 2015-02-10 MED ORDER — CHLORHEXIDINE GLUCONATE CLOTH 2 % EX PADS
6.0000 | MEDICATED_PAD | Freq: Every day | CUTANEOUS | Status: DC
  Administered 2015-02-11 – 2015-02-14 (×4): 6 via TOPICAL

## 2015-02-10 MED ORDER — PROPOFOL 10 MG/ML IV EMUL
5.0000 ug/kg/min | INTRAVENOUS | Status: DC
  Administered 2015-02-10: 70 ug/kg/min via INTRAVENOUS
  Administered 2015-02-10: 60 ug/kg/min via INTRAVENOUS
  Administered 2015-02-10: 50 ug/kg/min via INTRAVENOUS
  Administered 2015-02-10 – 2015-02-11 (×4): 70 ug/kg/min via INTRAVENOUS
  Filled 2015-02-10 (×5): qty 100

## 2015-02-10 MED ORDER — MUPIROCIN 2 % EX OINT
1.0000 "application " | TOPICAL_OINTMENT | Freq: Two times a day (BID) | CUTANEOUS | Status: DC
  Administered 2015-02-10 – 2015-02-14 (×9): 1 via NASAL
  Filled 2015-02-10: qty 22

## 2015-02-10 NOTE — Progress Notes (Signed)
Subjective: She was brought to the hospital yesterday with acute respiratory failure and eventually required intubation and mechanical ventilation which is still in place now. She apparently aspirated.  Objective: Vital signs in last 24 hours: Temp:  [98 F (36.7 C)-99.8 F (37.7 C)] 99.3 F (37.4 C) (02/22 0400) Pulse Rate:  [36-114] 85 (02/22 0645) Resp:  [10-38] 17 (02/22 0645) BP: (71-160)/(46-144) 112/67 mmHg (02/22 0645) SpO2:  [82 %-100 %] 97 % (02/22 0700) FiO2 (%):  [35 %-100 %] 35 % (02/22 0700) Weight:  [88.6 kg (195 lb 5.2 oz)-97.07 kg (214 lb)] 88.6 kg (195 lb 5.2 oz) (02/22 0545) Weight change:  Last BM Date:  (unknown)  Intake/Output from previous day: 02/21 0701 - 02/22 0700 In: 1508.9 [I.V.:1008.9; IV Piggyback:500] Out: 1123 [Urine:1123]  PHYSICAL EXAM General appearance: Intubated sedated on mechanical ventilation Resp: rhonchi bilaterally Cardio: regular rate and rhythm, S1, S2 normal, no murmur, click, rub or gallop GI: soft, non-tender; bowel sounds normal; no masses,  no organomegaly Extremities: extremities normal, atraumatic, no cyanosis or edema  Lab Results:  Results for orders placed or performed during the hospital encounter of 02/09/15 (from the past 48 hour(s))  CBC with Differential     Status: Abnormal   Collection Time: 02/09/15  8:30 AM  Result Value Ref Range   WBC 7.5 4.0 - 10.5 K/uL   RBC 3.92 3.87 - 5.11 MIL/uL   Hemoglobin 10.7 (L) 12.0 - 15.0 g/dL   HCT 36.4 36.0 - 46.0 %   MCV 92.9 78.0 - 100.0 fL   MCH 27.3 26.0 - 34.0 pg   MCHC 29.4 (L) 30.0 - 36.0 g/dL   RDW 17.9 (H) 11.5 - 15.5 %   Platelets 139 (L) 150 - 400 K/uL   Neutrophils Relative % 79 (H) 43 - 77 %   Neutro Abs 5.9 1.7 - 7.7 K/uL   Lymphocytes Relative 11 (L) 12 - 46 %   Lymphs Abs 0.8 0.7 - 4.0 K/uL   Monocytes Relative 9 3 - 12 %   Monocytes Absolute 0.7 0.1 - 1.0 K/uL   Eosinophils Relative 1 0 - 5 %   Eosinophils Absolute 0.1 0.0 - 0.7 K/uL   Basophils  Relative 0 0 - 1 %   Basophils Absolute 0.0 0.0 - 0.1 K/uL  Basic metabolic panel     Status: Abnormal   Collection Time: 02/09/15  8:30 AM  Result Value Ref Range   Sodium 141 135 - 145 mmol/L   Potassium 4.6 3.5 - 5.1 mmol/L   Chloride 101 96 - 112 mmol/L   CO2 38 (H) 19 - 32 mmol/L   Glucose, Bld 133 (H) 70 - 99 mg/dL   BUN 11 6 - 23 mg/dL   Creatinine, Ser 0.31 (L) 0.50 - 1.10 mg/dL   Calcium 7.7 (L) 8.4 - 10.5 mg/dL   GFR calc non Af Amer >90 >90 mL/min   GFR calc Af Amer >90 >90 mL/min    Comment: (NOTE) The eGFR has been calculated using the CKD EPI equation. This calculation has not been validated in all clinical situations. eGFR's persistently <90 mL/min signify possible Chronic Kidney Disease.    Anion gap 2 (L) 5 - 15  Troponin I     Status: None   Collection Time: 02/09/15  8:30 AM  Result Value Ref Range   Troponin I <0.03 <0.031 ng/mL    Comment:        NO INDICATION OF MYOCARDIAL INJURY.   Brain natriuretic peptide  Status: Abnormal   Collection Time: 02/09/15  8:30 AM  Result Value Ref Range   B Natriuretic Peptide 240.0 (H) 0.0 - 100.0 pg/mL  Procalcitonin - Baseline     Status: None   Collection Time: 02/09/15  8:30 AM  Result Value Ref Range   Procalcitonin <0.10 ng/mL    Comment:        Interpretation: PCT (Procalcitonin) <= 0.5 ng/mL: Systemic infection (sepsis) is not likely. Local bacterial infection is possible. (NOTE)         ICU PCT Algorithm               Non ICU PCT Algorithm    ----------------------------     ------------------------------         PCT < 0.25 ng/mL                 PCT < 0.1 ng/mL     Stopping of antibiotics            Stopping of antibiotics       strongly encouraged.               strongly encouraged.    ----------------------------     ------------------------------       PCT level decrease by               PCT < 0.25 ng/mL       >= 80% from peak PCT       OR PCT 0.25 - 0.5 ng/mL          Stopping of antibiotics                                              encouraged.     Stopping of antibiotics           encouraged.    ----------------------------     ------------------------------       PCT level decrease by              PCT >= 0.25 ng/mL       < 80% from peak PCT        AND PCT >= 0.5 ng/mL            Continuin g antibiotics                                              encouraged.       Continuing antibiotics            encouraged.    ----------------------------     ------------------------------     PCT level increase compared          PCT > 0.5 ng/mL         with peak PCT AND          PCT >= 0.5 ng/mL             Escalation of antibiotics                                          strongly encouraged.      Escalation of antibiotics  strongly encouraged.   Cortisol     Status: None   Collection Time: 02/09/15  8:40 AM  Result Value Ref Range   Cortisol, Plasma 27.1 ug/dL    Comment: (NOTE) AM:  4.3 - 22.4 ug/dL PM:  3.1 - 16.7 ug/dL Performed at [facility]     Status: None   Collection Time: 02/09/15  8:40 AM  Result Value Ref Range   Prothrombin Time 13.9 11.6 - 15.2 seconds   INR 1.06 0.00 - 1.49  APTT     Status: None   Collection Time: 02/09/15  8:40 AM  Result Value Ref Range   aPTT 37 24 - 37 seconds    Comment:        IF BASELINE aPTT IS ELEVATED, SUGGEST PATIENT RISK ASSESSMENT BE USED TO DETERMINE APPROPRIATE ANTICOAGULANT THERAPY.   Fibrinogen     Status: Abnormal   Collection Time: 02/09/15  8:40 AM  Result Value Ref Range   Fibrinogen >800 (H) 204 - 475 mg/dL  I-Stat CG4 Lactic Acid, ED     Status: None   Collection Time: 02/09/15  9:12 AM  Result Value Ref Range   Lactic Acid, Venous 0.83 0.5 - 2.0 mmol/L  Blood gas, arterial ([facility] & [facility] ONLY)     Status: Abnormal   Collection Time: 02/09/15  9:35 AM  Result Value Ref Range   FIO2 1.00 %   Delivery systems NON-REBREATHER OXYGEN MASK    pH, Arterial 7.253 (L) 7.350 - 7.450   pCO2  arterial 90.6 (HH) 35.0 - 45.0 mmHg    Comment: CRITICAL RESULT CALLED TO, READ BACK BY AND VERIFIED WITH: LESLIE CARDWELL RN AT [phone] BY JESSICA HALEY RRT RCP ON 02/09/2015    pO2, Arterial 104.0 (H) 80.0 - 100.0 mmHg   Bicarbonate 38.7 (H) 20.0 - 24.0 mEq/L   TCO2 36.6 0 - 100 mmol/L   Acid-Base Excess 11.4 (H) 0.0 - 2.0 mmol/L   O2 Saturation 97.3 %   Patient temperature 37.0    Collection site LEFT BRACHIAL    Drawn by [phone]    Sample type ARTERIAL DRAW   Urinalysis, Routine w reflex microscopic     Status: Abnormal   Collection Time: 02/09/15  9:50 AM  Result Value Ref Range   Color, Urine AMBER (A) YELLOW    Comment: BIOCHEMICALS MAY BE AFFECTED BY COLOR   APPearance CLOUDY (A) CLEAR   Specific Gravity, Urine >1.030 (H) 1.005 - 1.030   pH 5.5 5.0 - 8.0   Glucose, UA NEGATIVE NEGATIVE mg/dL   Hgb urine dipstick LARGE (A) NEGATIVE   Bilirubin Urine NEGATIVE NEGATIVE   Ketones, ur NEGATIVE NEGATIVE mg/dL   Protein, ur 100 (A) NEGATIVE mg/dL   Urobilinogen, UA 0.2 0.0 - 1.0 mg/dL   Nitrite POSITIVE (A) NEGATIVE   Leukocytes, UA MODERATE (A) NEGATIVE  Urine microscopic-add on     Status: Abnormal   Collection Time: 02/09/15  9:50 AM  Result Value Ref Range   WBC, UA TOO NUMEROUS TO COUNT <3 WBC/hpf   RBC / HPF 21-50 <3 RBC/hpf   Bacteria, UA MANY (A) RARE  Lactic acid, plasma     Status: None   Collection Time: 02/09/15 10:30 AM  Result Value Ref Range   Lactic Acid, Venous 0.8 0.5 - 2.0 mmol/L  Blood gas, arterial ([facility] & [facility] ONLY)     Status: Abnormal   Collection Time: 02/09/15 11:00 AM  Result Value Ref Range   FIO2  1.00 %   Delivery systems BILEVEL POSITIVE AIRWAY PRESSURE    Inspiratory PAP 18.0    Expiratory PAP 8.0    pH, Arterial 7.257 (L) 7.350 - 7.450   pCO2 arterial 89.3 (HH) 35.0 - 45.0 mmHg    Comment: CRITICAL RESULT CALLED TO, READ BACK BY AND VERIFIED WITH:  SAMTANI MD AT 1112 BY JESSICA HALEY RRT RCP ON 02/09/2015 CRITICAL RESULT CALLED TO, READ BACK BY  AND VERIFIED WITH: Iona Coach RN AT [place] RRT RCP ON 02/09/2015    pO2, Arterial 214.0 (H) 80.0 - 100.0 mmHg   Bicarbonate 38.4 (H) 20.0 - 24.0 mEq/L   TCO2 36.4 0 - 100 mmol/L   Acid-Base Excess 11.2 (H) 0.0 - 2.0 mmol/L   O2 Saturation 99.1 %   Patient temperature 37.0    Collection site LEFT BRACHIAL    Drawn by [phone]    Sample type ARTERIAL DRAW   Lactic acid, plasma     Status: None   Collection Time: 02/09/15  1:19 PM  Result Value Ref Range   Lactic Acid, Venous 1.3 0.5 - 2.0 mmol/L  Culture, blood (routine x 2)     Status: None (Preliminary result)   Collection Time: 02/09/15  1:19 PM  Result Value Ref Range   Specimen Description BLOOD BLOOD LEFT HAND    Special Requests BOTTLES DRAWN AEROBIC ONLY 6CC    Culture PENDING    Report Status PENDING   MRSA PCR Screening     Status: Abnormal   Collection Time: 02/09/15  2:40 PM  Result Value Ref Range   MRSA by PCR POSITIVE (A) NEGATIVE    Comment:        The GeneXpert MRSA Assay (FDA approved for NASAL specimens only), is one component of a comprehensive MRSA colonization surveillance program. It is not intended to diagnose MRSA infection nor to guide or monitor treatment for MRSA infections. RESULT CALLED TO, READ BACK BY AND VERIFIED WITH: E.SPANGLER AT 1714 ON 02/09/15 BY S.VANHOORNE   Strep pneumoniae urinary antigen     Status: None   Collection Time: 02/09/15  3:15 PM  Result Value Ref Range   Strep Pneumo Urinary Antigen NEGATIVE NEGATIVE    Comment: PERFORMED AT [facility]        Infection due to S. pneumoniae cannot be absolutely ruled out since the antigen present may be below the detection limit of the test. Performed at [facility]   Blood gas, arterial     Status: Abnormal   Collection Time: 02/09/15  3:35 PM  Result Value Ref Range   FIO2 70.00 %   Delivery systems VENTILATOR    Mode PRESSURE REGULATED VOLUME CONTROL    VT 440 mL   Rate 20 resp/min    Peep/cpap 5.0 cm H20   pH, Arterial 7.456 (H) 7.350 - 7.450   pCO2 arterial 45.1 (H) 35.0 - 45.0 mmHg   pO2, Arterial 229.0 (H) 80.0 - 100.0 mmHg   Bicarbonate 31.3 (H) 20.0 - 24.0 mEq/L   TCO2 28.8 0 - 100 mmol/L   Acid-Base Excess 7.2 (H) 0.0 - 2.0 mmol/L   O2 Saturation 98.9 %   Patient temperature 37.0    Collection site LEFT RADIAL    Drawn by [id number]    Sample type ARTERIAL    Allens test (pass/fail) PASS PASS  Comprehensive metabolic panel     Status: Abnormal   Collection Time: 02/09/15  5:15 PM  Result Value Ref Range  Sodium 141 135 - 145 mmol/L   Potassium 4.4 3.5 - 5.1 mmol/L   Chloride 109 96 - 112 mmol/L   CO2 30 19 - 32 mmol/L   Glucose, Bld 168 (H) 70 - 99 mg/dL   BUN 11 6 - 23 mg/dL   Creatinine, Ser 0.34 (L) 0.50 - 1.10 mg/dL   Calcium 6.9 (L) 8.4 - 10.5 mg/dL   Total Protein 5.1 (L) 6.0 - 8.3 g/dL   Albumin 2.4 (L) 3.5 - 5.2 g/dL   AST 15 0 - 37 U/L   ALT 15 0 - 35 U/L   Alkaline Phosphatase 57 39 - 117 U/L   Total Bilirubin 0.7 0.3 - 1.2 mg/dL   GFR calc non Af Amer >90 >90 mL/min   GFR calc Af Amer >90 >90 mL/min    Comment: (NOTE) The eGFR has been calculated using the CKD EPI equation. This calculation has not been validated in all clinical situations. eGFR's persistently <90 mL/min signify possible Chronic Kidney Disease.    Anion gap 2 (L) 5 - 15  Lactic acid, plasma     Status: None   Collection Time: 02/09/15  5:15 PM  Result Value Ref Range   Lactic Acid, Venous 1.4 0.5 - 2.0 mmol/L  Troponin I     Status: None   Collection Time: 02/09/15  5:15 PM  Result Value Ref Range   Troponin I <0.03 <0.031 ng/mL    Comment:        NO INDICATION OF MYOCARDIAL INJURY.   Type and screen     Status: None   Collection Time: 02/09/15  5:15 PM  Result Value Ref Range   ABO/RH(D) O POS    Antibody Screen NEG    Sample Expiration 02/12/2015   Lactic acid, plasma     Status: None   Collection Time: 02/09/15  8:05 PM  Result Value Ref Range   Lactic  Acid, Venous 1.8 0.5 - 2.0 mmol/L  Blood gas, arterial     Status: Abnormal   Collection Time: 02/09/15  9:43 PM  Result Value Ref Range   FIO2 0.40 %   Delivery systems VENTILATOR    Mode PRESSURE REGULATED VOLUME CONTROL    VT 440 mL   Rate 20 resp/min   Peep/cpap 5.0 cm H20   pH, Arterial 7.490 (H) 7.350 - 7.450   pCO2 arterial 39.5 35.0 - 45.0 mmHg   pO2, Arterial 112.0 (H) 80.0 - 100.0 mmHg   Bicarbonate 29.8 (H) 20.0 - 24.0 mEq/L   TCO2 26.4 0 - 100 mmol/L   Acid-Base Excess 6.3 (H) 0.0 - 2.0 mmol/L   O2 Saturation 98.6 %   Patient temperature 37.0    Collection site RADIAL    Drawn by [id number]    Sample type ARTERIAL    Allens test (pass/fail) PASS PASS  Blood gas, arterial     Status: Abnormal   Collection Time: 02/10/15  3:06 AM  Result Value Ref Range   FIO2 0.35 %   Delivery systems PRESSURE REGULATED VOLUME CONTROL    Mode VENTILATOR    VT 440 mL   Rate 16 resp/min   Peep/cpap 5.0 cm H20   pH, Arterial 7.459 (H) 7.350 - 7.450   pCO2 arterial 44.7 35.0 - 45.0 mmHg   pO2, Arterial 110.0 (H) 80.0 - 100.0 mmHg   Bicarbonate 31.3 (H) 20.0 - 24.0 mEq/L   TCO2 28.1 0 - 100 mmol/L   Acid-Base Excess 7.3 (H) 0.0 - 2.0 mmol/L  O2 Saturation 98.4 %   Patient temperature 37.0    Collection site RADIAL    Drawn by [id number]    Sample type ARTERIAL    Allens test (pass/fail) PASS PASS  Procalcitonin     Status: None   Collection Time: 02/10/15  4:26 AM  Result Value Ref Range   Procalcitonin <0.10 ng/mL    Comment:        Interpretation: PCT (Procalcitonin) <= 0.5 ng/mL: Systemic infection (sepsis) is not likely. Local bacterial infection is possible. (NOTE)         ICU PCT Algorithm               Non ICU PCT Algorithm    ----------------------------     ------------------------------         PCT < 0.25 ng/mL                 PCT < 0.1 ng/mL     Stopping of antibiotics            Stopping of antibiotics       strongly encouraged.               strongly encouraged.     ----------------------------     ------------------------------       PCT level decrease by               PCT < 0.25 ng/mL       >= 80% from peak PCT       OR PCT 0.25 - 0.5 ng/mL          Stopping of antibiotics                                             encouraged.     Stopping of antibiotics           encouraged.    ----------------------------     ------------------------------       PCT level decrease by              PCT >= 0.25 ng/mL       < 80% from peak PCT        AND PCT >= 0.5 ng/mL            Continuin g antibiotics                                              encouraged.       Continuing antibiotics            encouraged.    ----------------------------     ------------------------------     PCT level increase compared          PCT > 0.5 ng/mL         with peak PCT AND          PCT >= 0.5 ng/mL             Escalation of antibiotics                                          strongly encouraged.      Escalation of  antibiotics        strongly encouraged.     ABGS  Recent Labs  02/10/15 0306  PHART 7.459*  PO2ART 110.0*  TCO2 28.1  HCO3 31.3*   CULTURES Recent Results (from the past 240 hour(s))  Culture, blood (routine x 2)     Status: None (Preliminary result)   Collection Time: 02/09/15  1:19 PM  Result Value Ref Range Status   Specimen Description BLOOD BLOOD LEFT HAND  Final   Special Requests BOTTLES DRAWN AEROBIC ONLY [place]  Final   Culture PENDING  Incomplete   Report Status PENDING  Incomplete  MRSA PCR Screening     Status: Abnormal   Collection Time: 02/09/15  2:40 PM  Result Value Ref Range Status   MRSA by PCR POSITIVE (A) NEGATIVE Final    Comment:        The GeneXpert MRSA Assay (FDA approved for NASAL specimens only), is one component of a comprehensive MRSA colonization surveillance program. It is not intended to diagnose MRSA infection nor to guide or monitor treatment for MRSA infections. RESULT CALLED TO, READ BACK BY AND VERIFIED  WITH: E.SPANGLER AT 1714 ON 02/09/15 BY S.VANHOORNE    Studies/Results: Dg Chest Portable 1 View  02/09/2015   CLINICAL DATA:  Shortness of breath. Acute respiratory failure and productive cough.  EXAM: PORTABLE CHEST - 1 VIEW  COMPARISON:  01/08/2015  FINDINGS: Heart size is enlarged, unchanged from previous exam. There is diffuse pulmonary vascular congestion. Persistent right lung base opacity which may represent pneumonia or atelectasis.  IMPRESSION: 1. No change in aeration to the right lung base.   Electronically Signed   By: Kerby Moors M.D.   On: 02/09/2015 09:22   Dg Chest Port 1v Same Day  02/09/2015   CLINICAL DATA:  Subsequent evaluation of pulmonary edema  EXAM: PORTABLE CHEST - 1 VIEW SAME DAY  COMPARISON:  02/09/2015  FINDINGS: Endotracheal tube in unchanged position above the carina and NG tube crosses the gastroesophageal junction.  Stable mild to moderate cardiac enlargement. Moderate vascular congestion.  Increased opacity right lower lobe. Persistent retrocardiac opacity but improved when compared to prior study. Peribronchial cuffing and mild interstitial prominence.  IMPRESSION: Mild interstitial edema. Left lower lobe consolidation improved, with right lower lobe infiltrate mildly worse.   Electronically Signed   By: Skipper Cliche M.D.   On: 02/09/2015 21:22   Dg Chest Port 1v Same Day  02/09/2015   CLINICAL DATA:  Intubation, respiratory failure, acute shortness of breath  EXAM: PORTABLE CHEST - 1 VIEW SAME DAY  COMPARISON:  02/09/2015  FINDINGS: Endotracheal tube 2.9 cm above the carina. NG tube extends below the hemidiaphragms into the stomach. Tip not visualized. Exam is rotated to the left. Heart is enlarged with diffuse vascular congestion versus early edema. Worsening lingula and left lower lobe collapse/consolidation obscures the left hemidiaphragm and left cardiac border. No pneumothorax. Slight improvement right lower lobe atelectasis.  IMPRESSION: Endotracheal tube  2.9 cm above the carina.  Increased lingula and left lower lobe atelectasis/ consolidation  Cardiomegaly with mild edema   Electronically Signed   By: Jerilynn Mages.  Shick M.D.   On: 02/09/2015 14:15    Medications:  Prior to Admission:  Prescriptions prior to admission  Medication Sig Dispense Refill Last Dose  . aspirin 325 MG tablet Take 1 tablet (325 mg total) by mouth daily. 30 tablet 12 02/08/2015 at Unknown time  . baclofen (LIORESAL) 10 MG tablet Take 10 mg by mouth 3 (three)  times daily.    02/08/2015 at Unknown time  . cholecalciferol (VITAMIN D) 1000 UNITS tablet Take 1,000 Units by mouth daily.   02/08/2015 at Unknown time  . clopidogrel (PLAVIX) 75 MG tablet Take 1 tablet (75 mg total) by mouth daily with breakfast.   02/08/2015 at Unknown time  . Cranberry 450 MG CAPS Take 1 capsule by mouth daily.   02/08/2015 at Unknown time  . Erythromycin 2 % ointment Place 1 application into both eyes 4 (four) times daily. Apply 1/2 inch to each eye for 10 days(started 02/08/15)   02/08/2015 at Unknown time  . furosemide (LASIX) 20 MG tablet Take 2 tablets (40 mg total) by mouth daily. 30 tablet 12 02/08/2015 at Unknown time  . guaiFENesin (MUCINEX) 600 MG 12 hr tablet Take 600 mg by mouth 2 (two) times daily.   02/08/2015 at Unknown time  . haloperidol (HALDOL) 0.5 MG tablet Take 0.5 mg by mouth daily.    02/08/2015 at Unknown time  . insulin aspart (NOVOLOG) 100 UNIT/ML injection Inject 0-20 Units into the skin 3 (three) times daily with meals. (Patient taking differently: Inject 2-10 Units into the skin 3 (three) times daily with meals. Per sliding scale) 10 mL 11 02/09/2015 at Unknown time  . levothyroxine (SYNTHROID, LEVOTHROID) 88 MCG tablet Take 1 tablet (88 mcg total) by mouth daily before breakfast. 30 tablet 12 02/09/2015 at 600  . loratadine (CLARITIN) 10 MG tablet Take 10 mg by mouth daily.   02/08/2015 at Unknown time  . metoprolol tartrate (LOPRESSOR) 25 MG tablet Take 0.5 tablets (12.5 mg total) by mouth  2 (two) times daily. 180 tablet 3 02/08/2015 at 900 & 2100  . pantoprazole (PROTONIX) 40 MG tablet Take 1 tablet (40 mg total) by mouth daily. 30 tablet 12 02/09/2015 at Unknown time  . polyethylene glycol (MIRALAX / GLYCOLAX) packet Take 17 g by mouth daily.   02/08/2015 at Unknown time  . potassium chloride SA (K-DUR,KLOR-CON) 20 MEQ tablet Take 2 tablets (40 mEq total) by mouth 2 (two) times daily. 60 tablet 12 02/08/2015 at Unknown time  . predniSONE (DELTASONE) 10 MG tablet Take 10 mg by mouth daily with breakfast.   unknown  . ranolazine (RANEXA) 500 MG 12 hr tablet Take 1 tablet (500 mg total) by mouth 2 (two) times daily. 60 tablet 3 02/08/2015 at Unknown time  . saccharomyces boulardii (FLORASTOR) 250 MG capsule Take 250 mg by mouth 2 (two) times daily.   02/08/2015 at Unknown time  . sertraline (ZOLOFT) 100 MG tablet Take 1 tablet (100 mg total) by mouth daily.   02/08/2015 at Unknown time  . temazepam (RESTORIL) 7.5 MG capsule Take 7.5 mg by mouth at bedtime as needed for sleep.   02/06/2015  . traMADol (ULTRAM) 50 MG tablet Take 50 mg by mouth every 6 (six) hours as needed for moderate pain.   02/05/2015  . acetaminophen (TYLENOL) 325 MG tablet Take 2 tablets (650 mg total) by mouth every 6 (six) hours as needed for mild pain (or Fever >/= 101).   unknown  . albuterol (PROVENTIL HFA;VENTOLIN HFA) 108 (90 BASE) MCG/ACT inhaler Inhale 2 puffs into the lungs every 4 (four) hours as needed. Shortness of breath 1 Inhaler 12 unknown  . albuterol (PROVENTIL) (2.5 MG/3ML) 0.083% nebulizer solution Take 2.5 mg by nebulization 4 (four) times daily.   unknown  . ALPRAZolam (XANAX) 1 MG tablet Take 1 mg by mouth every 12 (twelve) hours as needed for anxiety.  02/06/2015  . atorvastatin (LIPITOR) 40 MG tablet Take 40 mg by mouth daily.   On Hold  . feeding supplement, GLUCERNA SHAKE, (GLUCERNA SHAKE) LIQD Take 237 mLs by mouth 2 (two) times daily between meals. 60 Can 0 unknown  . loperamide (IMODIUM) 2 MG  capsule Take 1 capsule (2 mg total) by mouth as needed for diarrhea or loose stools. 30 capsule 0 unknown  . nitroGLYCERIN (NITROSTAT) 0.4 MG SL tablet Place 1 tablet (0.4 mg total) under the tongue every 5 (five) minutes as needed for chest pain. 25 tablet 12 02/06/2015  . predniSONE (DELTASONE) 20 MG tablet Take 2 tablets (40 mg total) by mouth daily with breakfast. (Patient not taking: Reported on 02/09/2015)   Taking   Scheduled: . antiseptic oral rinse  7 mL Mouth Rinse QID  . aspirin  325 mg Oral Daily  . chlorhexidine  15 mL Mouth Rinse BID  . erythromycin  1 application Both Eyes QID  . famotidine (PEPCID) IV  20 mg Intravenous Q24H  . heparin subcutaneous  5,000 Units Subcutaneous 3 times per day  . ipratropium-albuterol  3 mL Nebulization Q4H  . levofloxacin (LEVAQUIN) IV  500 mg Intravenous Q24H  . levothyroxine  44 mcg Intravenous Daily  . methylPREDNISolone (SOLU-MEDROL) injection  125 mg Intravenous Q6H  . mometasone-formoterol  2 puff Inhalation BID  . piperacillin-tazobactam (ZOSYN)  IV  3.375 g Intravenous Q8H  . sodium chloride  10-40 mL Intracatheter Q12H  . vancomycin  1,250 mg Intravenous Q12H   Continuous: . sodium chloride 50 mL/hr at 02/10/15 0600   ACZ:[id number], sodium chloride  Assesment: She was admitted with acute respiratory failure and sepsis. She is felt to have aspirated. She is now intubated and on the ventilator. She is on appropriate antibiotics etc. 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  She has cerebral palsy at baseline and has been essentially nonambulatory now for 3-4 years.  She has what may be a urinary tract infection and current antibiotics should cover.  She has previous history of non-STEMI in the face of respiratory failure but troponins are negative so far  Chest x-ray looks like CHF. Her BNP was a little bit up at 240 with her sepsis etc. I don't think we can really treat CHF aggressively  yet  When she was in the hospital last time CODE STATUS intubation status etc. were discussed with family members and her son said that he thought she would like to be resuscitated and placed on ventilation if needed. She remained fairly confused throughout her hospitalization so discussion about end-of-life issues was not undertaken with her.   Principal Problem:   Sepsis-Probable Aspiration in settign recent Hospitalization with CDIFF/Yeast UTI Active Problems:   Complete rupture of rotator cuff   Infiltrating ductal carcinoma of right breast   Asthma   Hypothyroidism   NSTEMI (non-ST elevated myocardial infarction)   Acute and chronic respiratory failure with hypercapnia   Encephalopathy, metabolic   Intertriginous skin ulcer, limited to breakdown of skin   Acute on chronic respiratory failure    Plan: Continue current treatments.    LOS: 1 day   Onisha Cedeno L 02/10/2015, 7:40 AM

## 2015-02-10 NOTE — Clinical Documentation Improvement (Signed)
Progress note 02/10/15 states "Chest x-ray looks like CHF.  I don't think we can really treat CHF aggressively yet."  Please document the acuity and type of chf present this admission, if known,  in your progress note and carry over to the discharge summary.  CHF Acuity: -Acute -Chronic -Acute on chronic  CHF Type: -Diastolic -Systolic -Combined diastolic and systolic  Thank you, Mateo Flow, RN [phone] Clinical Documentation Specialist

## 2015-02-10 NOTE — Clinical Social Work Psychosocial (Signed)
Clinical Social Work Department BRIEF PSYCHOSOCIAL ASSESSMENT 02/10/2015  Patient:  [name], [name]     Account Number:  [id number]     Admit date:  02/09/2015  Clinical Social Worker:  Legrand Como  Date/Time:  02/10/2015 12:13 PM  Referred by:  CSW  Date Referred:  02/10/2015 Referred for  SNF Placement   Other Referral:   Interview type:  Family Other interview type:   [name], sister    PSYCHOSOCIAL DATA Living Status:  FACILITY Admitted from facility:  [facility] Level of care:  [place] Primary support name:  [name] Primary support relationship to patient:  [place] Degree of support available:   Sister visits patient daily.    CURRENT CONCERNS Current Concerns  Post-Acute Placement   Other Concerns:    SOCIAL WORK ASSESSMENT / PLAN CSW could not interview patient as patient is on a ventilator.  CSW met with patient's sister, [name], at bedside. Ms. Benjamine Mola indicated that patient has been at [facility] for 18 months.  She indicated that prior to that patient was at [facility] for a month.  Ms. Benjamine Mola reported that prior to patient going to any placement, she and patient's son spent every night with patient for three years.  She reported that patient made the decision to go to placement on her own.  Ms. Benjamine Mola indicated that she visits patient daily and that patient's son, [name], visits patient about twice per week.  Ms. Benjamine Mola indicated she desires for patient to return to [facility] upon discharge. Ms. Benjamine Mola became tearful in discussing patient being on a ventilator. CSW provided supportive counseling and encouragement to Ms. Benjamine Mola.  CSW spoke with [name] at [facility]. Debbie confirmed patient's statements.  She indicated that patient could return to the facility upon discharge.   Assessment/plan status:  Information/Referral to [employer] Other assessment/ plan:   Information/referral to community resources:    PATIENT'S/FAMILY'S  RESPONSE TO PLAN OF CARE: Patient's family agreeable for patient to return to [facility] upon discharge.    Ambrose Pancoast, [place]

## 2015-02-10 NOTE — Care Management Utilization Note (Signed)
UR completed 

## 2015-02-11 ENCOUNTER — Inpatient Hospital Stay (HOSPITAL_COMMUNITY): Payer: Medicare Other

## 2015-02-11 DIAGNOSIS — I5033 Acute on chronic diastolic (congestive) heart failure: Secondary | ICD-10-CM | POA: Diagnosis present

## 2015-02-11 LAB — BLOOD GAS, ARTERIAL
ACID-BASE EXCESS: 4.7 mmol/L — AB (ref 0.0–2.0)
Acid-Base Excess: 4.2 mmol/L — ABNORMAL HIGH (ref 0.0–2.0)
Bicarbonate: 28.6 mEq/L — ABNORMAL HIGH (ref 20.0–24.0)
Bicarbonate: 28.5 mEq/L — ABNORMAL HIGH (ref 20.0–24.0)
Drawn by: 213101
Drawn by: 25788
FIO2: 35 %
FIO2: 35 %
Mode: POSITIVE
O2 SAT: 97.5 %
O2 Saturation: 96 %
PCO2 ART: 44.3 mmHg (ref 35.0–45.0)
PEEP/CPAP: 5 cmH2O
PEEP: 5 cmH2O
PH ART: 7.45 (ref 7.350–7.450)
PH ART: 7.423 (ref 7.350–7.450)
PO2 ART: 84 mmHg (ref 80.0–100.0)
PRESSURE SUPPORT: 5 cmH2O
Patient temperature: 37
Patient temperature: 37
RATE: 16 resp/min
TCO2: 26.4 mmol/L (ref 0–100)
TCO2: 25.4 mmol/L (ref 0–100)
VT: 440 mL
pCO2 arterial: 41.8 mmHg (ref 35.0–45.0)
pO2, Arterial: 96.8 mmHg (ref 80.0–100.0)

## 2015-02-11 LAB — CBC WITH DIFFERENTIAL/PLATELET
BASOS ABS: 0 10*3/uL (ref 0.0–0.1)
BASOS PCT: 0 % (ref 0–1)
Eosinophils Absolute: 0 10*3/uL (ref 0.0–0.7)
Eosinophils Relative: 0 % (ref 0–5)
HCT: 28.2 % — ABNORMAL LOW (ref 36.0–46.0)
Hemoglobin: 8.8 g/dL — ABNORMAL LOW (ref 12.0–15.0)
LYMPHS PCT: 11 % — AB (ref 12–46)
Lymphs Abs: 0.6 10*3/uL — ABNORMAL LOW (ref 0.7–4.0)
MCH: 27.9 pg (ref 26.0–34.0)
MCHC: 31.2 g/dL (ref 30.0–36.0)
MCV: 89.5 fL (ref 78.0–100.0)
Monocytes Absolute: 0.3 10*3/uL (ref 0.1–1.0)
Monocytes Relative: 5 % (ref 3–12)
NEUTROS ABS: 4.6 10*3/uL (ref 1.7–7.7)
Neutrophils Relative %: 84 % — ABNORMAL HIGH (ref 43–77)
PLATELETS: 138 10*3/uL — AB (ref 150–400)
RBC: 3.15 MIL/uL — AB (ref 3.87–5.11)
RDW: 18.4 % — AB (ref 11.5–15.5)
WBC: 5.5 10*3/uL (ref 4.0–10.5)

## 2015-02-11 LAB — BASIC METABOLIC PANEL
ANION GAP: 10 (ref 5–15)
BUN: 13 mg/dL (ref 6–23)
CO2: 29 mmol/L (ref 19–32)
CREATININE: 0.34 mg/dL — AB (ref 0.50–1.10)
Calcium: 6.5 mg/dL — ABNORMAL LOW (ref 8.4–10.5)
Chloride: 105 mmol/L (ref 96–112)
GFR calc Af Amer: >90 mL/min (ref >90)
GFR calc non Af Amer: >90 mL/min (ref >90)
GLUCOSE: 170 mg/dL — AB (ref 70–99)
Potassium: 2.9 mmol/L — ABNORMAL LOW (ref 3.5–5.1)
Sodium: 144 mmol/L (ref 135–145)

## 2015-02-11 LAB — URINE CULTURE: Colony Count: >=100000

## 2015-02-11 LAB — VANCOMYCIN, TROUGH: VANCOMYCIN TR: 19.7 ug/mL (ref 10.0–20.0)

## 2015-02-11 LAB — PROCALCITONIN: Procalcitonin: <0.1 ng/mL

## 2015-02-11 MED ORDER — CETYLPYRIDINIUM CHLORIDE 0.05 % MT LIQD
7.0000 mL | Freq: Two times a day (BID) | OROMUCOSAL | Status: DC
  Administered 2015-02-11 – 2015-02-14 (×7): 7 mL via OROMUCOSAL

## 2015-02-11 MED ORDER — POTASSIUM CHLORIDE 10 MEQ/100ML IV SOLN
10.0000 meq | INTRAVENOUS | Status: AC
  Administered 2015-02-11 (×6): 10 meq via INTRAVENOUS
  Filled 2015-02-11 (×6): qty 100

## 2015-02-11 NOTE — Care Management Note (Addendum)
    Page 1 of 1   02/14/2015     11:48:39 AM CARE MANAGEMENT NOTE 02/14/2015  Patient:  SWETA, HALSETH   Account Number:  [id number]  Date Initiated:  02/11/2015  Documentation initiated by:  CHILDRESS,JESSICA  Subjective/Objective Assessment:   Pt is from [facility]. Pt admitted with asp pnuemonia and currently intubated. Plan for return to SNF at discharge. CSW aware and will arrange for return to facility. No CM needs.     Action/Plan:   Anticipated DC Date:  02/15/2015   Anticipated DC Plan:  SKILLED NURSING FACILITY  In-house referral  Clinical Social Worker      DC Planning Services  CM consult      Choice offered to / List presented to:             Status of service:  Completed, signed off Medicare Important Message given?  YES (If response is "NO", the following Medicare IM given date fields will be blank) Date Medicare IM given:  02/14/2015 Medicare IM given by:  Jolene Provost Date Additional Medicare IM given:   Additional Medicare IM given by:    Discharge Disposition:  [place]  Per UR Regulation:  Reviewed for med. necessity/level of care/duration of stay  If discussed at [place] of Stay Meetings, dates discussed:    Comments:  02/14/2015 [place], RN, MSN, CM Pt discharging today to [facility]. CSW has arranged for return to facility. No CM needs. 02/11/2015 0800 Jolene Provost, RN, MSN, CM

## 2015-02-11 NOTE — Progress Notes (Signed)
ANTIBIOTIC CONSULT NOTE - follow up  Pharmacy Consult for Vancomycin Indication: pneumonia  Allergies  Allergen Reactions  . Latex     Provided via MAR  . Naproxen Other (See Comments)    Unknown-Provided via MAR   Patient Measurements: Height: 5\' 4"  (162.6 cm) Weight: 200 lb 9.9 oz (91 kg) IBW/kg (Calculated) : 54.7 Adjusted Body Weight:   Vital Signs: Temp: 96.3 F (35.7 C) (02/23 0730) Temp Source: Axillary (02/23 0730) BP: 91/68 mmHg (02/23 0900) Pulse Rate: 106 (02/23 0900) Intake/Output from previous day: 02/22 0701 - 02/23 0700 In: 2875.2 [I.V.:2125.2; IV Piggyback:750] Out: 850 [Urine:850] Intake/Output from this shift: Total I/O In: 150 [I.V.:150] Out: -   Labs:  Recent Labs  02/09/15 0830 02/09/15 1715 02/10/15 0830 02/10/15 1146 02/11/15 0444  WBC 7.5  --  5.4  --  5.5  HGB 10.7*  --  9.1*  --  8.8*  PLT 139*  --  PLATELET CLUMPS NOTED ON SMEAR, COUNT APPEARS ADEQUATE  --  138*  CREATININE 0.31* 0.34*  --  0.55 0.34*   Estimated Creatinine Clearance: 68.4 mL/min (by C-G formula based on Cr of 0.34).  Recent Labs  02/11/15 0851  VANCOTROUGH 19.7    Microbiology: Recent Results (from the past 720 hour(s))  Urine culture     Status: None (Preliminary result)   Collection Time: 02/09/15  9:52 AM  Result Value Ref Range Status   Specimen Description URINE, CATHETERIZED  Final   Special Requests NONE  Final   Colony Count   Final    >=100,000 COLONIES/ML Performed at [facility]    Culture   Final    ESCHERICHIA COLI Performed at [facility]    Report Status PENDING  Incomplete  Culture, blood (routine x 2)     Status: None (Preliminary result)   Collection Time: 02/09/15  1:19 PM  Result Value Ref Range Status   Specimen Description BLOOD LEFT HAND  Final   Special Requests BOTTLES DRAWN AEROBIC ONLY 6CC  Final   Culture NO GROWTH 2 DAYS  Final   Report Status PENDING  Incomplete  MRSA PCR Screening     Status:  Abnormal   Collection Time: 02/09/15  2:40 PM  Result Value Ref Range Status   MRSA by PCR POSITIVE (A) NEGATIVE Final    Comment:        The GeneXpert MRSA Assay (FDA approved for NASAL specimens only), is one component of a comprehensive MRSA colonization surveillance program. It is not intended to diagnose MRSA infection nor to guide or monitor treatment for MRSA infections. RESULT CALLED TO, READ BACK BY AND VERIFIED WITH: E.SPANGLER AT 1714 ON 02/09/15 BY S.VANHOORNE   Culture, respiratory (NON-Expectorated)     Status: None (Preliminary result)   Collection Time: 02/09/15  3:00 PM  Result Value Ref Range Status   Specimen Description TRACHEAL ASPIRATE  Final   Special Requests NONE  Final   Gram Stain   Final    FEW WBC PRESENT, PREDOMINANTLY PMN NO SQUAMOUS EPITHELIAL CELLS SEEN NO ORGANISMS SEEN Performed at [facility]    Culture   Final    Culture reincubated for better growth Performed at [facility]    Report Status PENDING  Incomplete  Culture, blood (routine x 2)     Status: None (Preliminary result)   Collection Time: 02/09/15  5:15 PM  Result Value Ref Range Status   Specimen Description BLOOD RIGHT HAND  Final   Special  Requests BOTTLES DRAWN AEROBIC ONLY 6CC  Final   Culture NO GROWTH 2 DAYS  Final   Report Status PENDING  Incomplete    Medical History: Past Medical History  Diagnosis Date  . Cerebral palsy   . Asthma   . Seasonal allergies   . Thyroid disease   . HTN (hypertension)   . Anxiety   . Arthritis   . Breast cancer     Right breast, infiltrating ductal.  . Anginal pain   . Osteoporosis 05/08/2012  . Osteoporosis 05/08/2012  . Stroke   . Angina at rest   . Chronic steroid use   . Pneumonia 09/2013  . Dysphagia   . Hypopotassemia   . Respiratory failure   . Generalized weakness   . Urinary retention   . Dysphagia   . Acute MI   . Hypopotassemia   . Sepsis   . Neurogenic bladder   . Reflux   . Obesity    . Staph infection January 2016  . Clostridium difficile infection January 2016  . Acute respiratory failure     Medications:  Prescriptions prior to admission  Medication Sig Dispense Refill Last Dose  . aspirin 325 MG tablet Take 1 tablet (325 mg total) by mouth daily. 30 tablet 12 02/08/2015 at Unknown time  . baclofen (LIORESAL) 10 MG tablet Take 10 mg by mouth 3 (three) times daily.    02/08/2015 at Unknown time  . cholecalciferol (VITAMIN D) 1000 UNITS tablet Take 1,000 Units by mouth daily.   02/08/2015 at Unknown time  . clopidogrel (PLAVIX) 75 MG tablet Take 1 tablet (75 mg total) by mouth daily with breakfast.   02/08/2015 at Unknown time  . Cranberry 450 MG CAPS Take 1 capsule by mouth daily.   02/08/2015 at Unknown time  . Erythromycin 2 % ointment Place 1 application into both eyes 4 (four) times daily. Apply 1/2 inch to each eye for 10 days(started 02/08/15)   02/08/2015 at Unknown time  . furosemide (LASIX) 20 MG tablet Take 2 tablets (40 mg total) by mouth daily. 30 tablet 12 02/08/2015 at Unknown time  . guaiFENesin (MUCINEX) 600 MG 12 hr tablet Take 600 mg by mouth 2 (two) times daily.   02/08/2015 at Unknown time  . haloperidol (HALDOL) 0.5 MG tablet Take 0.5 mg by mouth daily.    02/08/2015 at Unknown time  . insulin aspart (NOVOLOG) 100 UNIT/ML injection Inject 0-20 Units into the skin 3 (three) times daily with meals. (Patient taking differently: Inject 2-10 Units into the skin 3 (three) times daily with meals. Per sliding scale) 10 mL 11 02/09/2015 at Unknown time  . levothyroxine (SYNTHROID, LEVOTHROID) 88 MCG tablet Take 1 tablet (88 mcg total) by mouth daily before breakfast. 30 tablet 12 02/09/2015 at 600  . loratadine (CLARITIN) 10 MG tablet Take 10 mg by mouth daily.   02/08/2015 at Unknown time  . metoprolol tartrate (LOPRESSOR) 25 MG tablet Take 0.5 tablets (12.5 mg total) by mouth 2 (two) times daily. 180 tablet 3 02/08/2015 at 900 & 2100  . pantoprazole (PROTONIX) 40 MG  tablet Take 1 tablet (40 mg total) by mouth daily. 30 tablet 12 02/09/2015 at Unknown time  . polyethylene glycol (MIRALAX / GLYCOLAX) packet Take 17 g by mouth daily.   02/08/2015 at Unknown time  . potassium chloride SA (K-DUR,KLOR-CON) 20 MEQ tablet Take 2 tablets (40 mEq total) by mouth 2 (two) times daily. 60 tablet 12 02/08/2015 at Unknown time  . predniSONE (DELTASONE)  10 MG tablet Take 10 mg by mouth daily with breakfast.   unknown  . ranolazine (RANEXA) 500 MG 12 hr tablet Take 1 tablet (500 mg total) by mouth 2 (two) times daily. 60 tablet 3 02/08/2015 at Unknown time  . saccharomyces boulardii (FLORASTOR) 250 MG capsule Take 250 mg by mouth 2 (two) times daily.   02/08/2015 at Unknown time  . sertraline (ZOLOFT) 100 MG tablet Take 1 tablet (100 mg total) by mouth daily.   02/08/2015 at Unknown time  . temazepam (RESTORIL) 7.5 MG capsule Take 7.5 mg by mouth at bedtime as needed for sleep.   02/06/2015  . traMADol (ULTRAM) 50 MG tablet Take 50 mg by mouth every 6 (six) hours as needed for moderate pain.   02/05/2015  . acetaminophen (TYLENOL) 325 MG tablet Take 2 tablets (650 mg total) by mouth every 6 (six) hours as needed for mild pain (or Fever >/= 101).   unknown  . albuterol (PROVENTIL HFA;VENTOLIN HFA) 108 (90 BASE) MCG/ACT inhaler Inhale 2 puffs into the lungs every 4 (four) hours as needed. Shortness of breath 1 Inhaler 12 unknown  . albuterol (PROVENTIL) (2.5 MG/3ML) 0.083% nebulizer solution Take 2.5 mg by nebulization 4 (four) times daily.   unknown  . ALPRAZolam (XANAX) 1 MG tablet Take 1 mg by mouth every 12 (twelve) hours as needed for anxiety.    02/06/2015  . atorvastatin (LIPITOR) 40 MG tablet Take 40 mg by mouth daily.   On Hold  . feeding supplement, GLUCERNA SHAKE, (GLUCERNA SHAKE) LIQD Take 237 mLs by mouth 2 (two) times daily between meals. 60 Can 0 unknown  . loperamide (IMODIUM) 2 MG capsule Take 1 capsule (2 mg total) by mouth as needed for diarrhea or loose stools. 30  capsule 0 unknown  . nitroGLYCERIN (NITROSTAT) 0.4 MG SL tablet Place 1 tablet (0.4 mg total) under the tongue every 5 (five) minutes as needed for chest pain. 25 tablet 12 02/06/2015  . predniSONE (DELTASONE) 20 MG tablet Take 2 tablets (40 mg total) by mouth daily with breakfast. (Patient not taking: Reported on 02/09/2015)   Taking   Assessment: 74 y.o. female brought in by ambulance, who presents to the Emergency Department complaining for acute respiratory failure. Per staff at at [facility] her condition rapidly declined since last night. She also has a productive cough with green mucous, nausea and drainage from right eye which is also swollen. Afebrile.  WBC improved.  Trough level on target.  Renal fxn stable.  Cultures pending.  Anti-infectives    Start     Dose/Rate Route Frequency Ordered Stop   02/09/15 2200  vancomycin (VANCOCIN) 1,250 mg in sodium chloride 0.9 % 250 mL IVPB     1,250 mg 166.7 mL/hr over 90 Minutes Intravenous Every 12 hours 02/09/15 1359     02/09/15 2200  levofloxacin (LEVAQUIN) IVPB 500 mg  Status:  Discontinued     500 mg 100 mL/hr over 60 Minutes Intravenous Every 24 hours 02/09/15 2143 02/10/15 1349   02/09/15 2000  piperacillin-tazobactam (ZOSYN) IVPB 3.375 g    Comments:  Pharmacy to dose for renal fxn   3.375 g 12.5 mL/hr over 240 Minutes Intravenous Every 8 hours 02/09/15 1953     02/09/15 1000  vancomycin (VANCOCIN) 1,500 mg in sodium chloride 0.9 % 500 mL IVPB     1,500 mg 250 mL/hr over 120 Minutes Intravenous  Once 02/09/15 0959 02/09/15 1233   02/09/15 0945  piperacillin-tazobactam (ZOSYN) IVPB 3.375 g  3.375 g 100 mL/hr over 30 Minutes Intravenous  Once 02/09/15 0935 02/09/15 1022     Goal of Therapy:  Vancomycin trough level 15-20 mcg/ml  Plan:  Continue Vancomycin 1250 mg IV every 12 hours Vancomycin trough level weekly or sooner if indicated. Continue Zosyn as ordered Deescalate ABX when appropriate Monitor renal function Labs per  protocol  Hart Robinsons A 02/11/2015,10:52 AM

## 2015-02-11 NOTE — Procedures (Signed)
Extubation Procedure Note  Patient Details:   Name: INFINITI HOEFLING DOB: 08/03/1941 MRN: [id number]   Airway Documentation:  Airway (Active)  Secured at (cm) 25 cm 02/09/2015 12:40 PM  Measured From Lips 02/09/2015 12:40 PM    Evaluation  O2 sats: stable throughout Complications: No apparent complications Patient did tolerate procedure well. Bilateral Breath Sounds: Diminished Suctioning: Oral, Airway Yes  Elsie Stain 02/11/2015, 8:43 AM

## 2015-02-11 NOTE — Progress Notes (Signed)
Subjective: She remains intubated and on the ventilator but is weaning today. No new problems have been noted except she is hypokalemic  Objective: Vital signs in last 24 hours: Temp:  [97.2 F (36.2 C)-98.9 F (37.2 C)] 98.1 F (36.7 C) (02/23 0400) Pulse Rate:  [43-134] 61 (02/23 0730) Resp:  [9-24] 16 (02/23 0730) BP: (93-131)/(56-89) 93/80 mmHg (02/23 0730) SpO2:  [82 %-100 %] 98 % (02/23 0730) FiO2 (%):  [35 %] 35 % (02/23 0748) Weight:  [91 kg (200 lb 9.9 oz)] 91 kg (200 lb 9.9 oz) (02/23 0500) Weight change: -6.07 kg (-13 lb 6.1 oz) Last BM Date: 02/11/15  Intake/Output from previous day: 02/22 0701 - 02/23 0700 In: 2875.2 [I.V.:2125.2; IV Piggyback:750] Out: 850 [Urine:850]  PHYSICAL EXAM General appearance: Intubated and on the ventilator. She is mildly agitated as her sedation has been reduced Resp: rhonchi bilaterally Cardio: regular rate and rhythm, S1, S2 normal, no murmur, click, rub or gallop GI: soft, non-tender; bowel sounds normal; no masses,  no organomegaly Extremities: extremities normal, atraumatic, no cyanosis or edema  Lab Results:  Results for orders placed or performed during the hospital encounter of 02/09/15 (from the past 48 hour(s))  CBC with Differential     Status: Abnormal   Collection Time: 02/09/15  8:30 AM  Result Value Ref Range   WBC 7.5 4.0 - 10.5 K/uL   RBC 3.92 3.87 - 5.11 MIL/uL   Hemoglobin 10.7 (L) 12.0 - 15.0 g/dL   HCT 36.4 36.0 - 46.0 %   MCV 92.9 78.0 - 100.0 fL   MCH 27.3 26.0 - 34.0 pg   MCHC 29.4 (L) 30.0 - 36.0 g/dL   RDW 17.9 (H) 11.5 - 15.5 %   Platelets 139 (L) 150 - 400 K/uL   Neutrophils Relative % 79 (H) 43 - 77 %   Neutro Abs 5.9 1.7 - 7.7 K/uL   Lymphocytes Relative 11 (L) 12 - 46 %   Lymphs Abs 0.8 0.7 - 4.0 K/uL   Monocytes Relative 9 3 - 12 %   Monocytes Absolute 0.7 0.1 - 1.0 K/uL   Eosinophils Relative 1 0 - 5 %   Eosinophils Absolute 0.1 0.0 - 0.7 K/uL   Basophils Relative 0 0 - 1 %   Basophils  Absolute 0.0 0.0 - 0.1 K/uL  Basic metabolic panel     Status: Abnormal   Collection Time: 02/09/15  8:30 AM  Result Value Ref Range   Sodium 141 135 - 145 mmol/L   Potassium 4.6 3.5 - 5.1 mmol/L   Chloride 101 96 - 112 mmol/L   CO2 38 (H) 19 - 32 mmol/L   Glucose, Bld 133 (H) 70 - 99 mg/dL   BUN 11 6 - 23 mg/dL   Creatinine, Ser 0.31 (L) 0.50 - 1.10 mg/dL   Calcium 7.7 (L) 8.4 - 10.5 mg/dL   GFR calc non Af Amer >90 >90 mL/min   GFR calc Af Amer >90 >90 mL/min    Comment: (NOTE) The eGFR has been calculated using the CKD EPI equation. This calculation has not been validated in all clinical situations. eGFR's persistently <90 mL/min signify possible Chronic Kidney Disease.    Anion gap 2 (L) 5 - 15  Troponin I     Status: None   Collection Time: 02/09/15  8:30 AM  Result Value Ref Range   Troponin I <0.03 <0.031 ng/mL    Comment:        NO INDICATION OF MYOCARDIAL INJURY.  Brain natriuretic peptide     Status: Abnormal   Collection Time: 02/09/15  8:30 AM  Result Value Ref Range   B Natriuretic Peptide 240.0 (H) 0.0 - 100.0 pg/mL  Procalcitonin - Baseline     Status: None   Collection Time: 02/09/15  8:30 AM  Result Value Ref Range   Procalcitonin <0.10 ng/mL    Comment:        Interpretation: PCT (Procalcitonin) <= 0.5 ng/mL: Systemic infection (sepsis) is not likely. Local bacterial infection is possible. (NOTE)         ICU PCT Algorithm               Non ICU PCT Algorithm    ----------------------------     ------------------------------         PCT < 0.25 ng/mL                 PCT < 0.1 ng/mL     Stopping of antibiotics            Stopping of antibiotics       strongly encouraged.               strongly encouraged.    ----------------------------     ------------------------------       PCT level decrease by               PCT < 0.25 ng/mL       >= 80% from peak PCT       OR PCT 0.25 - 0.5 ng/mL          Stopping of antibiotics                                              encouraged.     Stopping of antibiotics           encouraged.    ----------------------------     ------------------------------       PCT level decrease by              PCT >= 0.25 ng/mL       < 80% from peak PCT        AND PCT >= 0.5 ng/mL            Continuin g antibiotics                                              encouraged.       Continuing antibiotics            encouraged.    ----------------------------     ------------------------------     PCT level increase compared          PCT > 0.5 ng/mL         with peak PCT AND          PCT >= 0.5 ng/mL             Escalation of antibiotics                                          strongly encouraged.      Escalation of  antibiotics        strongly encouraged.   Cortisol     Status: None   Collection Time: 02/09/15  8:40 AM  Result Value Ref Range   Cortisol, Plasma 27.1 ug/dL    Comment: (NOTE) AM:  4.3 - 22.4 ug/dL PM:  3.1 - 16.7 ug/dL Performed at [facility]     Status: None   Collection Time: 02/09/15  8:40 AM  Result Value Ref Range   Prothrombin Time 13.9 11.6 - 15.2 seconds   INR 1.06 0.00 - 1.49  APTT     Status: None   Collection Time: 02/09/15  8:40 AM  Result Value Ref Range   aPTT 37 24 - 37 seconds    Comment:        IF BASELINE aPTT IS ELEVATED, SUGGEST PATIENT RISK ASSESSMENT BE USED TO DETERMINE APPROPRIATE ANTICOAGULANT THERAPY.   Fibrinogen     Status: Abnormal   Collection Time: 02/09/15  8:40 AM  Result Value Ref Range   Fibrinogen >800 (H) 204 - 475 mg/dL  I-Stat CG4 Lactic Acid, ED     Status: None   Collection Time: 02/09/15  9:12 AM  Result Value Ref Range   Lactic Acid, Venous 0.83 0.5 - 2.0 mmol/L  Blood gas, arterial ([facility] & [facility] ONLY)     Status: Abnormal   Collection Time: 02/09/15  9:35 AM  Result Value Ref Range   FIO2 1.00 %   Delivery systems NON-REBREATHER OXYGEN MASK    pH, Arterial 7.253 (L) 7.350 - 7.450   pCO2 arterial 90.6 (HH) 35.0 - 45.0  mmHg    Comment: CRITICAL RESULT CALLED TO, READ BACK BY AND VERIFIED WITH: LESLIE CARDWELL RN AT [phone] BY JESSICA HALEY RRT RCP ON 02/09/2015    pO2, Arterial 104.0 (H) 80.0 - 100.0 mmHg   Bicarbonate 38.7 (H) 20.0 - 24.0 mEq/L   TCO2 36.6 0 - 100 mmol/L   Acid-Base Excess 11.4 (H) 0.0 - 2.0 mmol/L   O2 Saturation 97.3 %   Patient temperature 37.0    Collection site LEFT BRACHIAL    Drawn by [phone]    Sample type ARTERIAL DRAW   Urinalysis, Routine w reflex microscopic     Status: Abnormal   Collection Time: 02/09/15  9:50 AM  Result Value Ref Range   Color, Urine AMBER (A) YELLOW    Comment: BIOCHEMICALS MAY BE AFFECTED BY COLOR   APPearance CLOUDY (A) CLEAR   Specific Gravity, Urine >1.030 (H) 1.005 - 1.030   pH 5.5 5.0 - 8.0   Glucose, UA NEGATIVE NEGATIVE mg/dL   Hgb urine dipstick LARGE (A) NEGATIVE   Bilirubin Urine NEGATIVE NEGATIVE   Ketones, ur NEGATIVE NEGATIVE mg/dL   Protein, ur 100 (A) NEGATIVE mg/dL   Urobilinogen, UA 0.2 0.0 - 1.0 mg/dL   Nitrite POSITIVE (A) NEGATIVE   Leukocytes, UA MODERATE (A) NEGATIVE  Urine microscopic-add on     Status: Abnormal   Collection Time: 02/09/15  9:50 AM  Result Value Ref Range   WBC, UA TOO NUMEROUS TO COUNT <3 WBC/hpf   RBC / HPF 21-50 <3 RBC/hpf   Bacteria, UA MANY (A) RARE  Urine culture     Status: None (Preliminary result)   Collection Time: 02/09/15  9:52 AM  Result Value Ref Range   Specimen Description URINE, CATHETERIZED    Special Requests NONE    Colony Count      >=100,000 COLONIES/ML Performed at [facility]  Culture      ESCHERICHIA COLI Performed at [facility]    Report Status PENDING   Lactic acid, plasma     Status: None   Collection Time: 02/09/15 10:30 AM  Result Value Ref Range   Lactic Acid, Venous 0.8 0.5 - 2.0 mmol/L  Blood gas, arterial ([facility] & [facility] ONLY)     Status: Abnormal   Collection Time: 02/09/15 11:00 AM  Result Value Ref Range   FIO2 1.00 %   Delivery systems  BILEVEL POSITIVE AIRWAY PRESSURE    Inspiratory PAP 18.0    Expiratory PAP 8.0    pH, Arterial 7.257 (L) 7.350 - 7.450   pCO2 arterial 89.3 (HH) 35.0 - 45.0 mmHg    Comment: CRITICAL RESULT CALLED TO, READ BACK BY AND VERIFIED WITH:  SAMTANI MD AT 1112 BY JESSICA HALEY RRT RCP ON 02/09/2015 CRITICAL RESULT CALLED TO, READ BACK BY AND VERIFIED WITH: Iona Coach RN AT 1112 BY JESSICA HALEY RRT RCP ON 02/09/2015    pO2, Arterial 214.0 (H) 80.0 - 100.0 mmHg   Bicarbonate 38.4 (H) 20.0 - 24.0 mEq/L   TCO2 36.4 0 - 100 mmol/L   Acid-Base Excess 11.2 (H) 0.0 - 2.0 mmol/L   O2 Saturation 99.1 %   Patient temperature 37.0    Collection site LEFT BRACHIAL    Drawn by [phone]    Sample type ARTERIAL DRAW   Lactic acid, plasma     Status: None   Collection Time: 02/09/15  1:19 PM  Result Value Ref Range   Lactic Acid, Venous 1.3 0.5 - 2.0 mmol/L  Culture, blood (routine x 2)     Status: None (Preliminary result)   Collection Time: 02/09/15  1:19 PM  Result Value Ref Range   Specimen Description BLOOD BLOOD LEFT HAND    Special Requests BOTTLES DRAWN AEROBIC ONLY 6CC    Culture NO GROWTH 1 DAY    Report Status PENDING   MRSA PCR Screening     Status: Abnormal   Collection Time: 02/09/15  2:40 PM  Result Value Ref Range   MRSA by PCR POSITIVE (A) NEGATIVE    Comment:        The GeneXpert MRSA Assay (FDA approved for NASAL specimens only), is one component of a comprehensive MRSA colonization surveillance program. It is not intended to diagnose MRSA infection nor to guide or monitor treatment for MRSA infections. RESULT CALLED TO, READ BACK BY AND VERIFIED WITH: E.SPANGLER AT 1714 ON 02/09/15 BY S.VANHOORNE   Culture, respiratory (NON-Expectorated)     Status: None (Preliminary result)   Collection Time: 02/09/15  3:00 PM  Result Value Ref Range   Specimen Description TRACHEAL ASPIRATE    Special Requests NONE    Gram Stain      FEW WBC PRESENT, PREDOMINANTLY PMN NO SQUAMOUS  EPITHELIAL CELLS SEEN NO ORGANISMS SEEN Performed at [facility]    Culture NO GROWTH Performed at [facility]     Report Status PENDING   Legionella antigen, urine     Status: None   Collection Time: 02/09/15  3:15 PM  Result Value Ref Range   Specimen Description URINE, CLEAN CATCH    Special Requests NONE    Legionella Antigen, Urine      Negative for Legionella pneumophila serogroup 1  Legionella pneumophila serogroup 1 antigen can be detected in urine within 2 to 3 days of infection and may persist even after treatment. This  assay does not detect other Legionella species or serogroups. Performed at [facility]    Report Status 02/10/2015 FINAL   Strep pneumoniae urinary antigen     Status: None   Collection Time: 02/09/15  3:15 PM  Result Value Ref Range   Strep Pneumo Urinary Antigen NEGATIVE NEGATIVE    Comment: PERFORMED AT [facility]        Infection due to S. pneumoniae cannot be absolutely ruled out since the antigen present may be below the detection limit of the test. Performed at [facility]   Blood gas, arterial     Status: Abnormal   Collection Time: 02/09/15  3:35 PM  Result Value Ref Range   FIO2 70.00 %   Delivery systems VENTILATOR    Mode PRESSURE REGULATED VOLUME CONTROL    VT 440 mL   Rate 20 resp/min   Peep/cpap 5.0 cm H20   pH, Arterial 7.456 (H) 7.350 - 7.450   pCO2 arterial 45.1 (H) 35.0 - 45.0 mmHg   pO2, Arterial 229.0 (H) 80.0 - 100.0 mmHg   Bicarbonate 31.3 (H) 20.0 - 24.0 mEq/L   TCO2 28.8 0 - 100 mmol/L   Acid-Base Excess 7.2 (H) 0.0 - 2.0 mmol/L   O2 Saturation 98.9 %   Patient temperature 37.0    Collection site LEFT RADIAL    Drawn by [id number]    Sample type ARTERIAL    Allens test (pass/fail) PASS PASS  Culture, blood (routine x 2)     Status: None (Preliminary result)   Collection Time: 02/09/15  5:15 PM  Result Value Ref  Range   Specimen Description BLOOD RIGHT HAND    Special Requests BOTTLES DRAWN AEROBIC ONLY 6CC    Culture NO GROWTH 1 DAY    Report Status PENDING   Comprehensive metabolic panel     Status: Abnormal   Collection Time: 02/09/15  5:15 PM  Result Value Ref Range   Sodium 141 135 - 145 mmol/L   Potassium 4.4 3.5 - 5.1 mmol/L   Chloride 109 96 - 112 mmol/L   CO2 30 19 - 32 mmol/L   Glucose, Bld 168 (H) 70 - 99 mg/dL   BUN 11 6 - 23 mg/dL   Creatinine, Ser 0.34 (L) 0.50 - 1.10 mg/dL   Calcium 6.9 (L) 8.4 - 10.5 mg/dL   Total Protein 5.1 (L) 6.0 - 8.3 g/dL   Albumin 2.4 (L) 3.5 - 5.2 g/dL   AST 15 0 - 37 U/L   ALT 15 0 - 35 U/L   Alkaline Phosphatase 57 39 - 117 U/L   Total Bilirubin 0.7 0.3 - 1.2 mg/dL   GFR calc non Af Amer >90 >90 mL/min   GFR calc Af Amer >90 >90 mL/min    Comment: (NOTE) The eGFR has been calculated using the CKD EPI equation. This calculation has not been validated in all clinical situations. eGFR's persistently <90 mL/min signify possible Chronic Kidney Disease.    Anion gap 2 (L) 5 - 15  Lactic acid, plasma     Status: None   Collection Time: 02/09/15  5:15 PM  Result Value Ref Range   Lactic Acid, Venous 1.4 0.5 - 2.0 mmol/L  Troponin I     Status: None   Collection Time: 02/09/15  5:15 PM  Result Value Ref Range  Troponin I <0.03 <0.031 ng/mL    Comment:        NO INDICATION OF MYOCARDIAL INJURY.   Type and screen     Status: None   Collection Time: 02/09/15  5:15 PM  Result Value Ref Range   ABO/RH(D) O POS    Antibody Screen NEG    Sample Expiration 02/12/2015   Lactic acid, plasma     Status: None   Collection Time: 02/09/15  8:05 PM  Result Value Ref Range   Lactic Acid, Venous 1.8 0.5 - 2.0 mmol/L  Blood gas, arterial     Status: Abnormal   Collection Time: 02/09/15  9:43 PM  Result Value Ref Range   FIO2 0.40 %   Delivery systems VENTILATOR    Mode PRESSURE REGULATED VOLUME CONTROL    VT 440 mL   Rate 20 resp/min   Peep/cpap  5.0 cm H20   pH, Arterial 7.490 (H) 7.350 - 7.450   pCO2 arterial 39.5 35.0 - 45.0 mmHg   pO2, Arterial 112.0 (H) 80.0 - 100.0 mmHg   Bicarbonate 29.8 (H) 20.0 - 24.0 mEq/L   TCO2 26.4 0 - 100 mmol/L   Acid-Base Excess 6.3 (H) 0.0 - 2.0 mmol/L   O2 Saturation 98.6 %   Patient temperature 37.0    Collection site RADIAL    Drawn by [id number]    Sample type ARTERIAL    Allens test (pass/fail) PASS PASS  Blood gas, arterial     Status: Abnormal   Collection Time: 02/10/15  3:06 AM  Result Value Ref Range   FIO2 0.35 %   Delivery systems PRESSURE REGULATED VOLUME CONTROL    Mode VENTILATOR    VT 440 mL   Rate 16 resp/min   Peep/cpap 5.0 cm H20   pH, Arterial 7.459 (H) 7.350 - 7.450   pCO2 arterial 44.7 35.0 - 45.0 mmHg   pO2, Arterial 110.0 (H) 80.0 - 100.0 mmHg   Bicarbonate 31.3 (H) 20.0 - 24.0 mEq/L   TCO2 28.1 0 - 100 mmol/L   Acid-Base Excess 7.3 (H) 0.0 - 2.0 mmol/L   O2 Saturation 98.4 %   Patient temperature 37.0    Collection site RADIAL    Drawn by [id number]    Sample type ARTERIAL    Allens test (pass/fail) PASS PASS  Procalcitonin     Status: None   Collection Time: 02/10/15  4:26 AM  Result Value Ref Range   Procalcitonin <0.10 ng/mL    Comment:        Interpretation: PCT (Procalcitonin) <= 0.5 ng/mL: Systemic infection (sepsis) is not likely. Local bacterial infection is possible. (NOTE)         ICU PCT Algorithm               Non ICU PCT Algorithm    ----------------------------     ------------------------------         PCT < 0.25 ng/mL                 PCT < 0.1 ng/mL     Stopping of antibiotics            Stopping of antibiotics       strongly encouraged.               strongly encouraged.    ----------------------------     ------------------------------       PCT level decrease by  PCT < 0.25 ng/mL       >= 80% from peak PCT       OR PCT 0.25 - 0.5 ng/mL          Stopping of antibiotics                                             encouraged.      Stopping of antibiotics           encouraged.    ----------------------------     ------------------------------       PCT level decrease by              PCT >= 0.25 ng/mL       < 80% from peak PCT        AND PCT >= 0.5 ng/mL            Continuin g antibiotics                                              encouraged.       Continuing antibiotics            encouraged.    ----------------------------     ------------------------------     PCT level increase compared          PCT > 0.5 ng/mL         with peak PCT AND          PCT >= 0.5 ng/mL             Escalation of antibiotics                                          strongly encouraged.      Escalation of antibiotics        strongly encouraged.   CBC with Differential/Platelet     Status: Abnormal   Collection Time: 02/10/15  8:30 AM  Result Value Ref Range   WBC 5.4 4.0 - 10.5 K/uL   RBC 3.26 (L) 3.87 - 5.11 MIL/uL   Hemoglobin 9.1 (L) 12.0 - 15.0 g/dL   HCT 29.3 (L) 36.0 - 46.0 %   MCV 89.9 78.0 - 100.0 fL   MCH 27.9 26.0 - 34.0 pg   MCHC 31.1 30.0 - 36.0 g/dL   RDW 17.9 (H) 11.5 - 15.5 %   Platelets  150 - 400 K/uL    PLATELET CLUMPS NOTED ON SMEAR, COUNT APPEARS ADEQUATE   Neutrophils Relative % 88 (H) 43 - 77 %   Lymphocytes Relative 9 (L) 12 - 46 %   Monocytes Relative 3 3 - 12 %   Eosinophils Relative 0 0 - 5 %   Basophils Relative 0 0 - 1 %   Neutro Abs 4.7 1.7 - 7.7 K/uL   Lymphs Abs 0.5 (L) 0.7 - 4.0 K/uL   Monocytes Absolute 0.2 0.1 - 1.0 K/uL   Eosinophils Absolute 0.0 0.0 - 0.7 K/uL   Basophils Absolute 0.0 0.0 - 0.1 K/uL   Smear Review PLATELETS APPEAR ADEQUATE     Comment: SPECIMEN CHECKED FOR CLOTS  Basic metabolic panel  Status: Abnormal   Collection Time: 02/10/15 11:46 AM  Result Value Ref Range   Sodium 142 135 - 145 mmol/L   Potassium 3.4 (L) 3.5 - 5.1 mmol/L    Comment: DELTA CHECK NOTED   Chloride 107 96 - 112 mmol/L   CO2 28 19 - 32 mmol/L   Glucose, Bld 172 (H) 70 - 99 mg/dL   BUN 14  6 - 23 mg/dL   Creatinine, Ser 0.55 0.50 - 1.10 mg/dL   Calcium 6.7 (L) 8.4 - 10.5 mg/dL   GFR calc non Af Amer >90 >90 mL/min   GFR calc Af Amer >90 >90 mL/min    Comment: (NOTE) The eGFR has been calculated using the CKD EPI equation. This calculation has not been validated in all clinical situations. eGFR's persistently <90 mL/min signify possible Chronic Kidney Disease.    Anion gap 7 5 - 15  Procalcitonin     Status: None   Collection Time: 02/11/15  4:44 AM  Result Value Ref Range   Procalcitonin <0.10 ng/mL    Comment:        Interpretation: PCT (Procalcitonin) <= 0.5 ng/mL: Systemic infection (sepsis) is not likely. Local bacterial infection is possible. (NOTE)         ICU PCT Algorithm               Non ICU PCT Algorithm    ----------------------------     ------------------------------         PCT < 0.25 ng/mL                 PCT < 0.1 ng/mL     Stopping of antibiotics            Stopping of antibiotics       strongly encouraged.               strongly encouraged.    ----------------------------     ------------------------------       PCT level decrease by               PCT < 0.25 ng/mL       >= 80% from peak PCT       OR PCT 0.25 - 0.5 ng/mL          Stopping of antibiotics                                             encouraged.     Stopping of antibiotics           encouraged.    ----------------------------     ------------------------------       PCT level decrease by              PCT >= 0.25 ng/mL       < 80% from peak PCT        AND PCT >= 0.5 ng/mL            Continuin g antibiotics                                              encouraged.       Continuing antibiotics            encouraged.    ----------------------------     ------------------------------  PCT level increase compared          PCT > 0.5 ng/mL         with peak PCT AND          PCT >= 0.5 ng/mL             Escalation of antibiotics                                          strongly  encouraged.      Escalation of antibiotics        strongly encouraged.   CBC with Differential/Platelet     Status: Abnormal   Collection Time: 02/11/15  4:44 AM  Result Value Ref Range   WBC 5.5 4.0 - 10.5 K/uL   RBC 3.15 (L) 3.87 - 5.11 MIL/uL   Hemoglobin 8.8 (L) 12.0 - 15.0 g/dL   HCT 28.2 (L) 36.0 - 46.0 %   MCV 89.5 78.0 - 100.0 fL   MCH 27.9 26.0 - 34.0 pg   MCHC 31.2 30.0 - 36.0 g/dL   RDW 18.4 (H) 11.5 - 15.5 %   Platelets 138 (L) 150 - 400 K/uL   Neutrophils Relative % 84 (H) 43 - 77 %   Neutro Abs 4.6 1.7 - 7.7 K/uL   Lymphocytes Relative 11 (L) 12 - 46 %   Lymphs Abs 0.6 (L) 0.7 - 4.0 K/uL   Monocytes Relative 5 3 - 12 %   Monocytes Absolute 0.3 0.1 - 1.0 K/uL   Eosinophils Relative 0 0 - 5 %   Eosinophils Absolute 0.0 0.0 - 0.7 K/uL   Basophils Relative 0 0 - 1 %   Basophils Absolute 0.0 0.0 - 0.1 K/uL  Basic metabolic panel     Status: Abnormal   Collection Time: 02/11/15  4:44 AM  Result Value Ref Range   Sodium 144 135 - 145 mmol/L   Potassium 2.9 (L) 3.5 - 5.1 mmol/L   Chloride 105 96 - 112 mmol/L   CO2 29 19 - 32 mmol/L   Glucose, Bld 170 (H) 70 - 99 mg/dL   BUN 13 6 - 23 mg/dL   Creatinine, Ser 0.34 (L) 0.50 - 1.10 mg/dL    Comment: DELTA CHECK NOTED   Calcium 6.5 (L) 8.4 - 10.5 mg/dL   GFR calc non Af Amer >90 >90 mL/min   GFR calc Af Amer >90 >90 mL/min    Comment: (NOTE) The eGFR has been calculated using the CKD EPI equation. This calculation has not been validated in all clinical situations. eGFR's persistently <90 mL/min signify possible Chronic Kidney Disease.    Anion gap 10 5 - 15  Blood gas, arterial     Status: Abnormal   Collection Time: 02/11/15  5:30 AM  Result Value Ref Range   FIO2 35.00 %   Delivery systems VENTILATOR    Mode PRESSURE REGULATED VOLUME CONTROL    VT 440 mL   Rate 16.0 resp/min   Peep/cpap 5.0 cm H20   pH, Arterial 7.450 7.350 - 7.450   pCO2 arterial 41.8 35.0 - 45.0 mmHg   pO2, Arterial 96.8 80.0 - 100.0  mmHg   Bicarbonate 28.6 (H) 20.0 - 24.0 mEq/L   TCO2 26.4 0 - 100 mmol/L   Acid-Base Excess 4.7 (H) 0.0 - 2.0 mmol/L   O2 Saturation 97.5 %   Patient temperature 37.0  Collection site RIGHT RADIAL    Drawn by [id number]    Sample type ARTERIAL    Allens test (pass/fail) PASS PASS    ABGS  Recent Labs  02/11/15 0530  PHART 7.450  PO2ART 96.8  TCO2 26.4  HCO3 28.6*   CULTURES Recent Results (from the past 240 hour(s))  Urine culture     Status: None (Preliminary result)   Collection Time: 02/09/15  9:52 AM  Result Value Ref Range Status   Specimen Description URINE, CATHETERIZED  Final   Special Requests NONE  Final   Colony Count   Final    >=100,000 COLONIES/ML Performed at [facility]    Culture   Final    ESCHERICHIA COLI Performed at [facility]    Report Status PENDING  Incomplete  Culture, blood (routine x 2)     Status: None (Preliminary result)   Collection Time: 02/09/15  1:19 PM  Result Value Ref Range Status   Specimen Description BLOOD BLOOD LEFT HAND  Final   Special Requests BOTTLES DRAWN AEROBIC ONLY [place]  Final   Culture NO GROWTH 1 DAY  Final   Report Status PENDING  Incomplete  MRSA PCR Screening     Status: Abnormal   Collection Time: 02/09/15  2:40 PM  Result Value Ref Range Status   MRSA by PCR POSITIVE (A) NEGATIVE Final    Comment:        The GeneXpert MRSA Assay (FDA approved for NASAL specimens only), is one component of a comprehensive MRSA colonization surveillance program. It is not intended to diagnose MRSA infection nor to guide or monitor treatment for MRSA infections. RESULT CALLED TO, READ BACK BY AND VERIFIED WITH: E.SPANGLER AT 1714 ON 02/09/15 BY S.VANHOORNE   Culture, respiratory (NON-Expectorated)     Status: None (Preliminary result)   Collection Time: 02/09/15  3:00 PM  Result Value Ref Range Status   Specimen Description TRACHEAL ASPIRATE  Final   Special Requests NONE  Final   Gram Stain   Final     FEW WBC PRESENT, PREDOMINANTLY PMN NO SQUAMOUS EPITHELIAL CELLS SEEN NO ORGANISMS SEEN Performed at [facility]    Culture NO GROWTH Performed at [facility]   Final   Report Status PENDING  Incomplete  Culture, blood (routine x 2)     Status: None (Preliminary result)   Collection Time: 02/09/15  5:15 PM  Result Value Ref Range Status   Specimen Description BLOOD RIGHT HAND  Final   Special Requests BOTTLES DRAWN AEROBIC ONLY [place]  Final   Culture NO GROWTH 1 DAY  Final   Report Status PENDING  Incomplete   Studies/Results: Dg Chest Port 1 View  02/11/2015   CLINICAL DATA:  Hypoxia  EXAM: PORTABLE CHEST - 1 VIEW  COMPARISON:  February 10, 2015  FINDINGS: Endotracheal tube tip is 2.9 cm above the carina. Nasogastric tube tip and side port are below the diaphragm. Central catheter tip is in the right atrium. No pneumothorax. There is a prominent skin fold on the right.  There is patchy airspace consolidation in each lung base, stable. No new opacity. Heart is mildly enlarged with pulmonary vascularity within normal limits, stable.  IMPRESSION: Tube and catheter positions as described without pneumothorax. Bibasilar airspace consolidation, stable. No new opacity. No change in cardiac silhouette. Prominent skin fold on right but no pneumothorax apparent.   Electronically Signed   By: Lowella Grip III M.D.   On: 02/11/2015 08:08  Dg Chest Port 1 View  02/10/2015   CLINICAL DATA:  Hypoxia/respiratory failure  EXAM: PORTABLE CHEST - 1 VIEW  COMPARISON:  February 09, 2015  FINDINGS: Endotracheal tube tip is 1.9 cm above the carina. Central catheter tip is in right atrium. Nasogastric tube tip and side port are below the diaphragm ; the side port is seen in the stomach region. No pneumothorax. There is patchy consolidation in both lung bases. Lungs elsewhere clear. Heart is mildly enlarged with pulmonary vascularity within normal limits. No adenopathy. There is degenerative  change in each shoulder.  IMPRESSION: Tube and catheter positions as described without pneumothorax. Patchy bibasilar airspace consolidation. Heart prominent but stable.   Electronically Signed   By: Lowella Grip III M.D.   On: 02/10/2015 08:18   Dg Chest Portable 1 View  02/09/2015   CLINICAL DATA:  Shortness of breath. Acute respiratory failure and productive cough.  EXAM: PORTABLE CHEST - 1 VIEW  COMPARISON:  01/08/2015  FINDINGS: Heart size is enlarged, unchanged from previous exam. There is diffuse pulmonary vascular congestion. Persistent right lung base opacity which may represent pneumonia or atelectasis.  IMPRESSION: 1. No change in aeration to the right lung base.   Electronically Signed   By: Kerby Moors M.D.   On: 02/09/2015 09:22   Dg Chest Port 1v Same Day  02/09/2015   CLINICAL DATA:  Subsequent evaluation of pulmonary edema  EXAM: PORTABLE CHEST - 1 VIEW SAME DAY  COMPARISON:  02/09/2015  FINDINGS: Endotracheal tube in unchanged position above the carina and NG tube crosses the gastroesophageal junction.  Stable mild to moderate cardiac enlargement. Moderate vascular congestion.  Increased opacity right lower lobe. Persistent retrocardiac opacity but improved when compared to prior study. Peribronchial cuffing and mild interstitial prominence.  IMPRESSION: Mild interstitial edema. Left lower lobe consolidation improved, with right lower lobe infiltrate mildly worse.   Electronically Signed   By: Skipper Cliche M.D.   On: 02/09/2015 21:22   Dg Chest Port 1v Same Day  02/09/2015   CLINICAL DATA:  Intubation, respiratory failure, acute shortness of breath  EXAM: PORTABLE CHEST - 1 VIEW SAME DAY  COMPARISON:  02/09/2015  FINDINGS: Endotracheal tube 2.9 cm above the carina. NG tube extends below the hemidiaphragms into the stomach. Tip not visualized. Exam is rotated to the left. Heart is enlarged with diffuse vascular congestion versus early edema. Worsening lingula and left lower lobe  collapse/consolidation obscures the left hemidiaphragm and left cardiac border. No pneumothorax. Slight improvement right lower lobe atelectasis.  IMPRESSION: Endotracheal tube 2.9 cm above the carina.  Increased lingula and left lower lobe atelectasis/ consolidation  Cardiomegaly with mild edema   Electronically Signed   By: Jerilynn Mages.  Shick M.D.   On: 02/09/2015 14:15    Medications:  Prior to Admission:  Prescriptions prior to admission  Medication Sig Dispense Refill Last Dose  . aspirin 325 MG tablet Take 1 tablet (325 mg total) by mouth daily. 30 tablet 12 02/08/2015 at Unknown time  . baclofen (LIORESAL) 10 MG tablet Take 10 mg by mouth 3 (three) times daily.    02/08/2015 at Unknown time  . cholecalciferol (VITAMIN D) 1000 UNITS tablet Take 1,000 Units by mouth daily.   02/08/2015 at Unknown time  . clopidogrel (PLAVIX) 75 MG tablet Take 1 tablet (75 mg total) by mouth daily with breakfast.   02/08/2015 at Unknown time  . Cranberry 450 MG CAPS Take 1 capsule by mouth daily.   02/08/2015 at Unknown time  .  Erythromycin 2 % ointment Place 1 application into both eyes 4 (four) times daily. Apply 1/2 inch to each eye for 10 days(started 02/08/15)   02/08/2015 at Unknown time  . furosemide (LASIX) 20 MG tablet Take 2 tablets (40 mg total) by mouth daily. 30 tablet 12 02/08/2015 at Unknown time  . guaiFENesin (MUCINEX) 600 MG 12 hr tablet Take 600 mg by mouth 2 (two) times daily.   02/08/2015 at Unknown time  . haloperidol (HALDOL) 0.5 MG tablet Take 0.5 mg by mouth daily.    02/08/2015 at Unknown time  . insulin aspart (NOVOLOG) 100 UNIT/ML injection Inject 0-20 Units into the skin 3 (three) times daily with meals. (Patient taking differently: Inject 2-10 Units into the skin 3 (three) times daily with meals. Per sliding scale) 10 mL 11 02/09/2015 at Unknown time  . levothyroxine (SYNTHROID, LEVOTHROID) 88 MCG tablet Take 1 tablet (88 mcg total) by mouth daily before breakfast. 30 tablet 12 02/09/2015 at 600  .  loratadine (CLARITIN) 10 MG tablet Take 10 mg by mouth daily.   02/08/2015 at Unknown time  . metoprolol tartrate (LOPRESSOR) 25 MG tablet Take 0.5 tablets (12.5 mg total) by mouth 2 (two) times daily. 180 tablet 3 02/08/2015 at 900 & 2100  . pantoprazole (PROTONIX) 40 MG tablet Take 1 tablet (40 mg total) by mouth daily. 30 tablet 12 02/09/2015 at Unknown time  . polyethylene glycol (MIRALAX / GLYCOLAX) packet Take 17 g by mouth daily.   02/08/2015 at Unknown time  . potassium chloride SA (K-DUR,KLOR-CON) 20 MEQ tablet Take 2 tablets (40 mEq total) by mouth 2 (two) times daily. 60 tablet 12 02/08/2015 at Unknown time  . predniSONE (DELTASONE) 10 MG tablet Take 10 mg by mouth daily with breakfast.   unknown  . ranolazine (RANEXA) 500 MG 12 hr tablet Take 1 tablet (500 mg total) by mouth 2 (two) times daily. 60 tablet 3 02/08/2015 at Unknown time  . saccharomyces boulardii (FLORASTOR) 250 MG capsule Take 250 mg by mouth 2 (two) times daily.   02/08/2015 at Unknown time  . sertraline (ZOLOFT) 100 MG tablet Take 1 tablet (100 mg total) by mouth daily.   02/08/2015 at Unknown time  . temazepam (RESTORIL) 7.5 MG capsule Take 7.5 mg by mouth at bedtime as needed for sleep.   02/06/2015  . traMADol (ULTRAM) 50 MG tablet Take 50 mg by mouth every 6 (six) hours as needed for moderate pain.   02/05/2015  . acetaminophen (TYLENOL) 325 MG tablet Take 2 tablets (650 mg total) by mouth every 6 (six) hours as needed for mild pain (or Fever >/= 101).   unknown  . albuterol (PROVENTIL HFA;VENTOLIN HFA) 108 (90 BASE) MCG/ACT inhaler Inhale 2 puffs into the lungs every 4 (four) hours as needed. Shortness of breath 1 Inhaler 12 unknown  . albuterol (PROVENTIL) (2.5 MG/3ML) 0.083% nebulizer solution Take 2.5 mg by nebulization 4 (four) times daily.   unknown  . ALPRAZolam (XANAX) 1 MG tablet Take 1 mg by mouth every 12 (twelve) hours as needed for anxiety.    02/06/2015  . atorvastatin (LIPITOR) 40 MG tablet Take 40 mg by mouth  daily.   On Hold  . feeding supplement, GLUCERNA SHAKE, (GLUCERNA SHAKE) LIQD Take 237 mLs by mouth 2 (two) times daily between meals. 60 Can 0 unknown  . loperamide (IMODIUM) 2 MG capsule Take 1 capsule (2 mg total) by mouth as needed for diarrhea or loose stools. 30 capsule 0 unknown  . nitroGLYCERIN (NITROSTAT)  0.4 MG SL tablet Place 1 tablet (0.4 mg total) under the tongue every 5 (five) minutes as needed for chest pain. 25 tablet 12 02/06/2015  . predniSONE (DELTASONE) 20 MG tablet Take 2 tablets (40 mg total) by mouth daily with breakfast. (Patient not taking: Reported on 02/09/2015)   Taking   Scheduled: . antiseptic oral rinse  7 mL Mouth Rinse QID  . aspirin  325 mg Oral Daily  . chlorhexidine  15 mL Mouth Rinse BID  . Chlorhexidine Gluconate Cloth  6 each Topical Q0600  . erythromycin  1 application Both Eyes QID  . famotidine (PEPCID) IV  20 mg Intravenous Q24H  . heparin subcutaneous  5,000 Units Subcutaneous 3 times per day  . ipratropium-albuterol  3 mL Nebulization Q4H  . levothyroxine  44 mcg Intravenous Daily  . methylPREDNISolone (SOLU-MEDROL) injection  125 mg Intravenous Q6H  . mometasone-formoterol  2 puff Inhalation BID  . mupirocin ointment  1 application Nasal BID  . piperacillin-tazobactam (ZOSYN)  IV  3.375 g Intravenous Q8H  . potassium chloride  10 mEq Intravenous Q1 Hr x 6  . sodium chloride  10-40 mL Intracatheter Q12H  . vancomycin  1,250 mg Intravenous Q12H   Continuous: . sodium chloride 50 mL/hr at 02/11/15 0725  . propofol 70 mcg/kg/min (02/11/15 0730)   [id number], sodium chloride  Assesment: She was admitted with acute on chronic respiratory failure. She is improving slowly. She has what seems to be aspiration pneumonia.  She has some element of heart failure which I think is acute on chronic diastolic heart failure. I think she can start on some diuretics at this point but needs to have her potassium replaced  She is hypokalemic and that's been  replaced IV  She has protein calorie malnutrition She has cerebral palsy and that sometimes makes communication difficult.  She's had problems with significant agitation from metabolic encephalopathy and the last time that she was intubated and on the ventilator this made it very difficult to get her off the ventilator  Principal Problem:   Sepsis-Probable Aspiration in settign recent Hospitalization with CDIFF/Yeast UTI Active Problems:   Infiltrating ductal carcinoma of right breast   Asthma   Hypertension   Hypothyroidism   Acute and chronic respiratory failure with hypercapnia   Encephalopathy, metabolic   Intertriginous skin ulcer, limited to breakdown of skin   Protein-calorie malnutrition, severe   COPD (chronic obstructive pulmonary disease)   Coronary atherosclerosis of native coronary artery   Acute on chronic respiratory failure   Acute on chronic diastolic heart failure    Plan:For weaning today. Replace her potassium.   LOS: 2 days   Reniya Mcclees L 02/11/2015, 8:14 AM

## 2015-02-11 NOTE — Progress Notes (Signed)
Pt was extubated to a 4 L [place]. SATS 95% and HR 95, RR 22. Pt was stablel throughout extubation. Leak test perfornmed. The Pt wasn't able to do FVC and NIF due to her mental capacity. ABG was normal. Pt extubated per Dr. Luan Pulling.

## 2015-02-12 ENCOUNTER — Inpatient Hospital Stay (HOSPITAL_COMMUNITY): Payer: Medicare Other

## 2015-02-12 LAB — CBC WITH DIFFERENTIAL/PLATELET
BASOS ABS: 0 10*3/uL (ref 0.0–0.1)
Basophils Relative: 0 % (ref 0–1)
Eosinophils Absolute: 0 10*3/uL (ref 0.0–0.7)
Eosinophils Relative: 0 % (ref 0–5)
HCT: 33.5 % — ABNORMAL LOW (ref 36.0–46.0)
Hemoglobin: 10.1 g/dL — ABNORMAL LOW (ref 12.0–15.0)
LYMPHS ABS: 0.6 10*3/uL — AB (ref 0.7–4.0)
LYMPHS PCT: 5 % — AB (ref 12–46)
MCH: 27.3 pg (ref 26.0–34.0)
MCHC: 30.1 g/dL (ref 30.0–36.0)
MCV: 90.5 fL (ref 78.0–100.0)
MONOS PCT: 5 % (ref 3–12)
Monocytes Absolute: 0.6 10*3/uL (ref 0.1–1.0)
NEUTROS PCT: 90 % — AB (ref 43–77)
Neutro Abs: 10.1 10*3/uL — ABNORMAL HIGH (ref 1.7–7.7)
PLATELETS: ADEQUATE 10*3/uL (ref 150–400)
RBC: 3.7 MIL/uL — ABNORMAL LOW (ref 3.87–5.11)
RDW: 19.1 % — AB (ref 11.5–15.5)
WBC: 11.3 10*3/uL — ABNORMAL HIGH (ref 4.0–10.5)

## 2015-02-12 LAB — BASIC METABOLIC PANEL
Anion gap: 4 — ABNORMAL LOW (ref 5–15)
BUN: 13 mg/dL (ref 6–23)
CALCIUM: 6.1 mg/dL — AB (ref 8.4–10.5)
CO2: 25 mmol/L (ref 19–32)
Chloride: 115 mmol/L — ABNORMAL HIGH (ref 96–112)
Creatinine, Ser: 0.49 mg/dL — ABNORMAL LOW (ref 0.50–1.10)
GFR calc Af Amer: >90 mL/min (ref >90)
GFR calc non Af Amer: >90 mL/min (ref >90)
GLUCOSE: 234 mg/dL — AB (ref 70–99)
Potassium: 2.9 mmol/L — ABNORMAL LOW (ref 3.5–5.1)
Sodium: 144 mmol/L (ref 135–145)

## 2015-02-12 LAB — BLOOD GAS, ARTERIAL
Acid-Base Excess: 3.2 mmol/L — ABNORMAL HIGH (ref 0.0–2.0)
Bicarbonate: 27.7 mEq/L — ABNORMAL HIGH (ref 20.0–24.0)
DRAWN BY: 21310
O2 CONTENT: 3 L/min
O2 Saturation: 96.5 %
PATIENT TEMPERATURE: 37
PH ART: 7.397 (ref 7.350–7.450)
TCO2: 25.7 mmol/L (ref 0–100)
pCO2 arterial: 46 mmHg — ABNORMAL HIGH (ref 35.0–45.0)
pO2, Arterial: 87.4 mmHg (ref 80.0–100.0)

## 2015-02-12 LAB — GLUCOSE, CAPILLARY: Glucose-Capillary: 165 mg/dL — ABNORMAL HIGH (ref 70–99)

## 2015-02-12 LAB — CULTURE, RESPIRATORY W GRAM STAIN

## 2015-02-12 LAB — CULTURE, RESPIRATORY

## 2015-02-12 LAB — CLOSTRIDIUM DIFFICILE BY PCR: Toxigenic C. Difficile by PCR: NEGATIVE

## 2015-02-12 LAB — TRIGLYCERIDES: Triglycerides: 229 mg/dL — ABNORMAL HIGH (ref <150)

## 2015-02-12 MED ORDER — BACLOFEN 10 MG PO TABS
10.0000 mg | ORAL_TABLET | Freq: Three times a day (TID) | ORAL | Status: DC
  Administered 2015-02-12 – 2015-02-14 (×7): 10 mg via ORAL
  Filled 2015-02-12 (×10): qty 1

## 2015-02-12 MED ORDER — NITROGLYCERIN 0.4 MG SL SUBL: 0.4000 mg | SUBLINGUAL_TABLET | SUBLINGUAL | Status: DC | PRN

## 2015-02-12 MED ORDER — HALOPERIDOL 0.5 MG PO TABS
0.5000 mg | ORAL_TABLET | Freq: Every day | ORAL | Status: DC
  Administered 2015-02-12 – 2015-02-14 (×3): 0.5 mg via ORAL
  Filled 2015-02-12 (×6): qty 1

## 2015-02-12 MED ORDER — ALPRAZOLAM 0.5 MG PO TABS
0.5000 mg | ORAL_TABLET | Freq: Three times a day (TID) | ORAL | Status: DC | PRN
  Administered 2015-02-12 – 2015-02-13 (×5): 0.5 mg via ORAL
  Filled 2015-02-12 (×5): qty 1

## 2015-02-12 MED ORDER — SERTRALINE HCL 50 MG PO TABS
100.0000 mg | ORAL_TABLET | Freq: Every day | ORAL | Status: DC
  Administered 2015-02-12 – 2015-02-14 (×3): 100 mg via ORAL
  Filled 2015-02-12 (×3): qty 2

## 2015-02-12 MED ORDER — FAMOTIDINE 20 MG PO TABS
20.0000 mg | ORAL_TABLET | Freq: Every day | ORAL | Status: DC
  Administered 2015-02-12 – 2015-02-13 (×2): 20 mg via ORAL
  Filled 2015-02-12 (×2): qty 1

## 2015-02-12 MED ORDER — POTASSIUM CHLORIDE CRYS ER 20 MEQ PO TBCR
20.0000 meq | EXTENDED_RELEASE_TABLET | Freq: Two times a day (BID) | ORAL | Status: DC
  Administered 2015-02-12 (×2): 20 meq via ORAL
  Filled 2015-02-12 (×2): qty 1

## 2015-02-12 MED ORDER — RANOLAZINE ER 500 MG PO TB12
500.0000 mg | ORAL_TABLET | Freq: Two times a day (BID) | ORAL | Status: DC
  Administered 2015-02-12 – 2015-02-14 (×5): 500 mg via ORAL
  Filled 2015-02-12 (×6): qty 1

## 2015-02-12 MED ORDER — TRAMADOL HCL 50 MG PO TABS: 50.0000 mg | ORAL_TABLET | Freq: Four times a day (QID) | ORAL | Status: DC | PRN

## 2015-02-12 MED ORDER — SACCHAROMYCES BOULARDII 250 MG PO CAPS
250.0000 mg | ORAL_CAPSULE | Freq: Two times a day (BID) | ORAL | Status: DC
  Administered 2015-02-12 – 2015-02-14 (×5): 250 mg via ORAL
  Filled 2015-02-12 (×5): qty 1

## 2015-02-12 MED ORDER — POLYETHYLENE GLYCOL 3350 17 G PO PACK
17.0000 g | PACK | Freq: Every day | ORAL | Status: DC
  Administered 2015-02-14: 17 g via ORAL
  Filled 2015-02-12 (×3): qty 1

## 2015-02-12 MED ORDER — METHYLPREDNISOLONE SODIUM SUCC 40 MG IJ SOLR
40.0000 mg | Freq: Four times a day (QID) | INTRAMUSCULAR | Status: DC
  Administered 2015-02-12 – 2015-02-14 (×9): 40 mg via INTRAVENOUS
  Filled 2015-02-12 (×9): qty 1

## 2015-02-12 MED ORDER — CLOPIDOGREL BISULFATE 75 MG PO TABS
75.0000 mg | ORAL_TABLET | Freq: Every day | ORAL | Status: DC
  Administered 2015-02-12 – 2015-02-14 (×3): 75 mg via ORAL
  Filled 2015-02-12 (×3): qty 1

## 2015-02-12 MED ORDER — TEMAZEPAM 7.5 MG PO CAPS
7.5000 mg | ORAL_CAPSULE | Freq: Every evening | ORAL | Status: DC | PRN
  Administered 2015-02-12: 7.5 mg via ORAL
  Filled 2015-02-12: qty 1

## 2015-02-12 MED ORDER — FUROSEMIDE 10 MG/ML IJ SOLN
40.0000 mg | Freq: Every day | INTRAMUSCULAR | Status: DC
  Administered 2015-02-12 – 2015-02-14 (×3): 40 mg via INTRAVENOUS
  Filled 2015-02-12 (×3): qty 4

## 2015-02-12 MED ORDER — ATORVASTATIN CALCIUM 40 MG PO TABS
40.0000 mg | ORAL_TABLET | Freq: Every day | ORAL | Status: DC
  Administered 2015-02-12 – 2015-02-13 (×2): 40 mg via ORAL
  Filled 2015-02-12 (×2): qty 1

## 2015-02-12 MED ORDER — GUAIFENESIN ER 600 MG PO TB12
600.0000 mg | ORAL_TABLET | Freq: Two times a day (BID) | ORAL | Status: DC
  Administered 2015-02-12 – 2015-02-14 (×5): 600 mg via ORAL
  Filled 2015-02-12 (×5): qty 1

## 2015-02-12 NOTE — Progress Notes (Signed)
Inpatient Diabetes Program Recommendations  AACE/ADA: New Consensus Statement on Inpatient Glycemic Control (2013)  Target Ranges:  Prepandial:   less than 140 mg/dL      Peak postprandial:   less than 180 mg/dL (1-2 hours)      Critically ill patients:  140 - 180 mg/dL   Results for ELEANORE, JUNIO (MRN [id number]) as of 02/12/2015 14:03  Ref. Range 02/09/2015 08:30 02/09/2015 17:15 02/10/2015 11:46 02/11/2015 04:44 02/12/2015 09:25  Glucose Latest Range: 70-99 mg/dL 133 (H) 168 (H) 172 (H) 170 (H) 234 (H)   Current orders for Inpatient glycemic control: None  Inpatient Diabetes Program Recommendations Correction (SSI): Noted random lab glucose of 234 mg/dl at 9:25 today which is likely result of Solumedrol. Noted Solumedrol was decreased to 40 mg Q6H. While inpatient and ordered steroids, may want to consider ordering CBGs with Novolog sensitive correction scale.   Thanks, Barnie Alderman, RN, MSN, CCRN, CDE Diabetes Coordinator Inpatient Diabetes Program [phone] (Team Pager) [phone] ([facility] office) [phone] [facility] office)

## 2015-02-12 NOTE — Progress Notes (Signed)
Subjective: She was able to be successfully extubated yesterday. She's been somewhat agitated because she's not been on her regular medications yet. No other new problems have been noted  Objective: Vital signs in last 24 hours: Temp:  [98.2 F (36.8 C)-99 F (37.2 C)] 98.2 F (36.8 C) (02/24 0400) Pulse Rate:  [27-163] 113 (02/24 0600) Resp:  [19-33] 24 (02/24 0600) BP: (91-155)/(60-106) 129/106 mmHg (02/24 0600) SpO2:  [75 %-99 %] 98 % (02/24 0600) FiO2 (%):  [35 %] 35 % (02/23 0748) Weight:  [88.7 kg (195 lb 8.8 oz)] 88.7 kg (195 lb 8.8 oz) (02/24 0500) Weight change: -2.3 kg (-5 lb 1.1 oz) Last BM Date: 02/11/15  Intake/Output from previous day: 02/23 0701 - 02/24 0700 In: 2500 [I.V.:1200; IV Piggyback:1300] Out: 300 [Urine:300]  PHYSICAL EXAM General appearance: She is alert and somewhat agitated. Resp: clear to auscultation bilaterally Cardio: regular rate and rhythm, S1, S2 normal, no murmur, click, rub or gallop GI: soft, non-tender; bowel sounds normal; no masses,  no organomegaly Extremities: extremities normal, atraumatic, no cyanosis or edema  Lab Results:  Results for orders placed or performed during the hospital encounter of 02/09/15 (from the past 48 hour(s))  CBC with Differential/Platelet     Status: Abnormal   Collection Time: 02/10/15  8:30 AM  Result Value Ref Range   WBC 5.4 4.0 - 10.5 K/uL   RBC 3.26 (L) 3.87 - 5.11 MIL/uL   Hemoglobin 9.1 (L) 12.0 - 15.0 g/dL   HCT 29.3 (L) 36.0 - 46.0 %   MCV 89.9 78.0 - 100.0 fL   MCH 27.9 26.0 - 34.0 pg   MCHC 31.1 30.0 - 36.0 g/dL   RDW 17.9 (H) 11.5 - 15.5 %   Platelets  150 - 400 K/uL    PLATELET CLUMPS NOTED ON SMEAR, COUNT APPEARS ADEQUATE   Neutrophils Relative % 88 (H) 43 - 77 %   Lymphocytes Relative 9 (L) 12 - 46 %   Monocytes Relative 3 3 - 12 %   Eosinophils Relative 0 0 - 5 %   Basophils Relative 0 0 - 1 %   Neutro Abs 4.7 1.7 - 7.7 K/uL   Lymphs Abs 0.5 (L) 0.7 - 4.0 K/uL   Monocytes  Absolute 0.2 0.1 - 1.0 K/uL   Eosinophils Absolute 0.0 0.0 - 0.7 K/uL   Basophils Absolute 0.0 0.0 - 0.1 K/uL   Smear Review PLATELETS APPEAR ADEQUATE     Comment: SPECIMEN CHECKED FOR CLOTS  Basic metabolic panel     Status: Abnormal   Collection Time: 02/10/15 11:46 AM  Result Value Ref Range   Sodium 142 135 - 145 mmol/L   Potassium 3.4 (L) 3.5 - 5.1 mmol/L    Comment: DELTA CHECK NOTED   Chloride 107 96 - 112 mmol/L   CO2 28 19 - 32 mmol/L   Glucose, Bld 172 (H) 70 - 99 mg/dL   BUN 14 6 - 23 mg/dL   Creatinine, Ser 0.55 0.50 - 1.10 mg/dL   Calcium 6.7 (L) 8.4 - 10.5 mg/dL   GFR calc non Af Amer >90 >90 mL/min   GFR calc Af Amer >90 >90 mL/min    Comment: (NOTE) The eGFR has been calculated using the CKD EPI equation. This calculation has not been validated in all clinical situations. eGFR's persistently <90 mL/min signify possible Chronic Kidney Disease.    Anion gap 7 5 - 15  Procalcitonin     Status: None   Collection Time: 02/11/15  4:44 AM  Result Value Ref Range   Procalcitonin <0.10 ng/mL    Comment:        Interpretation: PCT (Procalcitonin) <= 0.5 ng/mL: Systemic infection (sepsis) is not likely. Local bacterial infection is possible. (NOTE)         ICU PCT Algorithm               Non ICU PCT Algorithm    ----------------------------     ------------------------------         PCT < 0.25 ng/mL                 PCT < 0.1 ng/mL     Stopping of antibiotics            Stopping of antibiotics       strongly encouraged.               strongly encouraged.    ----------------------------     ------------------------------       PCT level decrease by               PCT < 0.25 ng/mL       >= 80% from peak PCT       OR PCT 0.25 - 0.5 ng/mL          Stopping of antibiotics                                             encouraged.     Stopping of antibiotics           encouraged.    ----------------------------     ------------------------------       PCT level decrease by               PCT >= 0.25 ng/mL       < 80% from peak PCT        AND PCT >= 0.5 ng/mL            Continuin g antibiotics                                              encouraged.       Continuing antibiotics            encouraged.    ----------------------------     ------------------------------     PCT level increase compared          PCT > 0.5 ng/mL         with peak PCT AND          PCT >= 0.5 ng/mL             Escalation of antibiotics                                          strongly encouraged.      Escalation of antibiotics        strongly encouraged.   CBC with Differential/Platelet     Status: Abnormal   Collection Time: 02/11/15  4:44 AM  Result Value Ref Range   WBC 5.5 4.0 - 10.5 K/uL   RBC 3.15 (L) 3.87 -  5.11 MIL/uL   Hemoglobin 8.8 (L) 12.0 - 15.0 g/dL   HCT 28.2 (L) 36.0 - 46.0 %   MCV 89.5 78.0 - 100.0 fL   MCH 27.9 26.0 - 34.0 pg   MCHC 31.2 30.0 - 36.0 g/dL   RDW 18.4 (H) 11.5 - 15.5 %   Platelets 138 (L) 150 - 400 K/uL   Neutrophils Relative % 84 (H) 43 - 77 %   Neutro Abs 4.6 1.7 - 7.7 K/uL   Lymphocytes Relative 11 (L) 12 - 46 %   Lymphs Abs 0.6 (L) 0.7 - 4.0 K/uL   Monocytes Relative 5 3 - 12 %   Monocytes Absolute 0.3 0.1 - 1.0 K/uL   Eosinophils Relative 0 0 - 5 %   Eosinophils Absolute 0.0 0.0 - 0.7 K/uL   Basophils Relative 0 0 - 1 %   Basophils Absolute 0.0 0.0 - 0.1 K/uL  Basic metabolic panel     Status: Abnormal   Collection Time: 02/11/15  4:44 AM  Result Value Ref Range   Sodium 144 135 - 145 mmol/L   Potassium 2.9 (L) 3.5 - 5.1 mmol/L   Chloride 105 96 - 112 mmol/L   CO2 29 19 - 32 mmol/L   Glucose, Bld 170 (H) 70 - 99 mg/dL   BUN 13 6 - 23 mg/dL   Creatinine, Ser 0.34 (L) 0.50 - 1.10 mg/dL    Comment: DELTA CHECK NOTED   Calcium 6.5 (L) 8.4 - 10.5 mg/dL   GFR calc non Af Amer >90 >90 mL/min   GFR calc Af Amer >90 >90 mL/min    Comment: (NOTE) The eGFR has been calculated using the CKD EPI equation. This calculation has not been  validated in all clinical situations. eGFR's persistently <90 mL/min signify possible Chronic Kidney Disease.    Anion gap 10 5 - 15  Blood gas, arterial     Status: Abnormal   Collection Time: 02/11/15  5:30 AM  Result Value Ref Range   FIO2 35.00 %   Delivery systems VENTILATOR    Mode PRESSURE REGULATED VOLUME CONTROL    VT 440 mL   Rate 16.0 resp/min   Peep/cpap 5.0 cm H20   pH, Arterial 7.450 7.350 - 7.450   pCO2 arterial 41.8 35.0 - 45.0 mmHg   pO2, Arterial 96.8 80.0 - 100.0 mmHg   Bicarbonate 28.6 (H) 20.0 - 24.0 mEq/L   TCO2 26.4 0 - 100 mmol/L   Acid-Base Excess 4.7 (H) 0.0 - 2.0 mmol/L   O2 Saturation 97.5 %   Patient temperature 37.0    Collection site RIGHT RADIAL    Drawn by [id number]    Sample type ARTERIAL    Allens test (pass/fail) PASS PASS  Blood gas, arterial     Status: Abnormal   Collection Time: 02/11/15  8:19 AM  Result Value Ref Range   FIO2 35.00 %   Delivery systems VENTILATOR    Mode CONTINUOUS POSITIVE AIRWAY PRESSURE    Peep/cpap 5.0 cm H20   Pressure support 5 cm H20   pH, Arterial 7.423 7.350 - 7.450   pCO2 arterial 44.3 35.0 - 45.0 mmHg   pO2, Arterial 84.0 80.0 - 100.0 mmHg   Bicarbonate 28.5 (H) 20.0 - 24.0 mEq/L   TCO2 25.4 0 - 100 mmol/L   Acid-Base Excess 4.2 (H) 0.0 - 2.0 mmol/L   O2 Saturation 96.0 %   Patient temperature 37.0    Collection site RIGHT RADIAL    Drawn by [id number]  Sample type ARTERIAL    Allens test (pass/fail) PASS PASS  Vancomycin, trough     Status: None   Collection Time: 02/11/15  8:51 AM  Result Value Ref Range   Vancomycin Tr 19.7 10.0 - 20.0 ug/mL  Glucose, capillary     Status: Abnormal   Collection Time: 02/11/15  8:53 PM  Result Value Ref Range   Glucose-Capillary 165 (H) 70 - 99 mg/dL  Triglycerides     Status: Abnormal   Collection Time: 02/12/15  4:35 AM  Result Value Ref Range   Triglycerides 229 (H) <150 mg/dL  Blood gas, arterial     Status: Abnormal   Collection Time: 02/12/15  4:45 AM   Result Value Ref Range   O2 Content 3.0 L/min   Delivery systems NASAL CANNULA    pH, Arterial 7.397 7.350 - 7.450   pCO2 arterial 46.0 (H) 35.0 - 45.0 mmHg   pO2, Arterial 87.4 80.0 - 100.0 mmHg   Bicarbonate 27.7 (H) 20.0 - 24.0 mEq/L   TCO2 25.7 0 - 100 mmol/L   Acid-Base Excess 3.2 (H) 0.0 - 2.0 mmol/L   O2 Saturation 96.5 %   Patient temperature 37.0    Collection site RIGHT RADIAL    Drawn by [id number]    Sample type ARTERIAL    Allens test (pass/fail) PASS PASS    ABGS  Recent Labs  02/12/15 0445  PHART 7.397  PO2ART 87.4  TCO2 25.7  HCO3 27.7*   CULTURES Recent Results (from the past 240 hour(s))  Urine culture     Status: None   Collection Time: 02/09/15  9:52 AM  Result Value Ref Range Status   Specimen Description URINE, CATHETERIZED  Final   Special Requests NONE  Final   Colony Count   Final    >=100,000 COLONIES/ML Performed at [facility]   Final    ESCHERICHIA COLI Performed at [facility]    Report Status 02/11/2015 FINAL  Final   Organism ID, Bacteria ESCHERICHIA COLI  Final      Susceptibility   Escherichia coli - MIC*    AMPICILLIN >=32 RESISTANT Resistant     CEFAZOLIN <=4 SENSITIVE Sensitive     CEFTRIAXONE <=1 SENSITIVE Sensitive     CIPROFLOXACIN <=0.25 SENSITIVE Sensitive     GENTAMICIN <=1 SENSITIVE Sensitive     LEVOFLOXACIN <=0.12 SENSITIVE Sensitive     NITROFURANTOIN >=512 RESISTANT Resistant     TOBRAMYCIN <=1 SENSITIVE Sensitive     TRIMETH/SULFA <=20 SENSITIVE Sensitive     PIP/TAZO <=4 SENSITIVE Sensitive     * ESCHERICHIA COLI  Culture, blood (routine x 2)     Status: None (Preliminary result)   Collection Time: 02/09/15  1:19 PM  Result Value Ref Range Status   Specimen Description BLOOD LEFT HAND  Final   Special Requests BOTTLES DRAWN AEROBIC ONLY 6CC  Final   Culture NO GROWTH 2 DAYS  Final   Report Status PENDING  Incomplete  MRSA PCR Screening     Status: Abnormal   Collection Time:  02/09/15  2:40 PM  Result Value Ref Range Status   MRSA by PCR POSITIVE (A) NEGATIVE Final    Comment:        The GeneXpert MRSA Assay (FDA approved for NASAL specimens only), is one component of a comprehensive MRSA colonization surveillance program. It is not intended to diagnose MRSA infection nor to guide or monitor treatment for MRSA infections. RESULT CALLED TO, READ  BACK BY AND VERIFIED WITH: E.SPANGLER AT 7989 ON 02/09/15 BY S.VANHOORNE   Culture, respiratory (NON-Expectorated)     Status: None (Preliminary result)   Collection Time: 02/09/15  3:00 PM  Result Value Ref Range Status   Specimen Description TRACHEAL ASPIRATE  Final   Special Requests NONE  Final   Gram Stain   Final    FEW WBC PRESENT, PREDOMINANTLY PMN NO SQUAMOUS EPITHELIAL CELLS SEEN NO ORGANISMS SEEN Performed at [facility]    Culture   Final    Culture reincubated for better growth Performed at [facility]    Report Status PENDING  Incomplete  Urine culture     Status: None (Preliminary result)   Collection Time: 02/09/15  3:10 PM  Result Value Ref Range Status   Specimen Description URINE, CATHETERIZED  Final   Special Requests Normal  Final   Colony Count   Final    3,000 COLONIES/ML Performed at [facility]    Culture   Final    ESCHERICHIA COLI Performed at [facility]    Report Status PENDING  Incomplete  Culture, blood (routine x 2)     Status: None (Preliminary result)   Collection Time: 02/09/15  5:15 PM  Result Value Ref Range Status   Specimen Description BLOOD RIGHT HAND  Final   Special Requests BOTTLES DRAWN AEROBIC ONLY [place]  Final   Culture NO GROWTH 2 DAYS  Final   Report Status PENDING  Incomplete   Studies/Results: Dg Chest Port 1 View  02/11/2015   CLINICAL DATA:  Hypoxia  EXAM: PORTABLE CHEST - 1 VIEW  COMPARISON:  February 10, 2015  FINDINGS: Endotracheal tube tip is 2.9 cm above the carina. Nasogastric tube tip and side port are  below the diaphragm. Central catheter tip is in the right atrium. No pneumothorax. There is a prominent skin fold on the right.  There is patchy airspace consolidation in each lung base, stable. No new opacity. Heart is mildly enlarged with pulmonary vascularity within normal limits, stable.  IMPRESSION: Tube and catheter positions as described without pneumothorax. Bibasilar airspace consolidation, stable. No new opacity. No change in cardiac silhouette. Prominent skin fold on right but no pneumothorax apparent.   Electronically Signed   By: Lowella Grip III M.D.   On: 02/11/2015 08:08   Dg Chest Port 1 View  02/10/2015   CLINICAL DATA:  Hypoxia/respiratory failure  EXAM: PORTABLE CHEST - 1 VIEW  COMPARISON:  February 09, 2015  FINDINGS: Endotracheal tube tip is 1.9 cm above the carina. Central catheter tip is in right atrium. Nasogastric tube tip and side port are below the diaphragm ; the side port is seen in the stomach region. No pneumothorax. There is patchy consolidation in both lung bases. Lungs elsewhere clear. Heart is mildly enlarged with pulmonary vascularity within normal limits. No adenopathy. There is degenerative change in each shoulder.  IMPRESSION: Tube and catheter positions as described without pneumothorax. Patchy bibasilar airspace consolidation. Heart prominent but stable.   Electronically Signed   By: Lowella Grip III M.D.   On: 02/10/2015 08:18    Medications:  Prior to Admission:  Prescriptions prior to admission  Medication Sig Dispense Refill Last Dose  . aspirin 325 MG tablet Take 1 tablet (325 mg total) by mouth daily. 30 tablet 12 02/08/2015 at Unknown time  . baclofen (LIORESAL) 10 MG tablet Take 10 mg by mouth 3 (three) times daily.    02/08/2015 at Unknown time  .  cholecalciferol (VITAMIN D) 1000 UNITS tablet Take 1,000 Units by mouth daily.   02/08/2015 at Unknown time  . clopidogrel (PLAVIX) 75 MG tablet Take 1 tablet (75 mg total) by mouth daily with  breakfast.   02/08/2015 at Unknown time  . Cranberry 450 MG CAPS Take 1 capsule by mouth daily.   02/08/2015 at Unknown time  . Erythromycin 2 % ointment Place 1 application into both eyes 4 (four) times daily. Apply 1/2 inch to each eye for 10 days(started 02/08/15)   02/08/2015 at Unknown time  . furosemide (LASIX) 20 MG tablet Take 2 tablets (40 mg total) by mouth daily. 30 tablet 12 02/08/2015 at Unknown time  . guaiFENesin (MUCINEX) 600 MG 12 hr tablet Take 600 mg by mouth 2 (two) times daily.   02/08/2015 at Unknown time  . haloperidol (HALDOL) 0.5 MG tablet Take 0.5 mg by mouth daily.    02/08/2015 at Unknown time  . insulin aspart (NOVOLOG) 100 UNIT/ML injection Inject 0-20 Units into the skin 3 (three) times daily with meals. (Patient taking differently: Inject 2-10 Units into the skin 3 (three) times daily with meals. Per sliding scale) 10 mL 11 02/09/2015 at Unknown time  . levothyroxine (SYNTHROID, LEVOTHROID) 88 MCG tablet Take 1 tablet (88 mcg total) by mouth daily before breakfast. 30 tablet 12 02/09/2015 at 600  . loratadine (CLARITIN) 10 MG tablet Take 10 mg by mouth daily.   02/08/2015 at Unknown time  . metoprolol tartrate (LOPRESSOR) 25 MG tablet Take 0.5 tablets (12.5 mg total) by mouth 2 (two) times daily. 180 tablet 3 02/08/2015 at 900 & 2100  . pantoprazole (PROTONIX) 40 MG tablet Take 1 tablet (40 mg total) by mouth daily. 30 tablet 12 02/09/2015 at Unknown time  . polyethylene glycol (MIRALAX / GLYCOLAX) packet Take 17 g by mouth daily.   02/08/2015 at Unknown time  . potassium chloride SA (K-DUR,KLOR-CON) 20 MEQ tablet Take 2 tablets (40 mEq total) by mouth 2 (two) times daily. 60 tablet 12 02/08/2015 at Unknown time  . predniSONE (DELTASONE) 10 MG tablet Take 10 mg by mouth daily with breakfast.   unknown  . ranolazine (RANEXA) 500 MG 12 hr tablet Take 1 tablet (500 mg total) by mouth 2 (two) times daily. 60 tablet 3 02/08/2015 at Unknown time  . saccharomyces boulardii (FLORASTOR) 250 MG  capsule Take 250 mg by mouth 2 (two) times daily.   02/08/2015 at Unknown time  . sertraline (ZOLOFT) 100 MG tablet Take 1 tablet (100 mg total) by mouth daily.   02/08/2015 at Unknown time  . temazepam (RESTORIL) 7.5 MG capsule Take 7.5 mg by mouth at bedtime as needed for sleep.   02/06/2015  . traMADol (ULTRAM) 50 MG tablet Take 50 mg by mouth every 6 (six) hours as needed for moderate pain.   02/05/2015  . acetaminophen (TYLENOL) 325 MG tablet Take 2 tablets (650 mg total) by mouth every 6 (six) hours as needed for mild pain (or Fever >/= 101).   unknown  . albuterol (PROVENTIL HFA;VENTOLIN HFA) 108 (90 BASE) MCG/ACT inhaler Inhale 2 puffs into the lungs every 4 (four) hours as needed. Shortness of breath 1 Inhaler 12 unknown  . albuterol (PROVENTIL) (2.5 MG/3ML) 0.083% nebulizer solution Take 2.5 mg by nebulization 4 (four) times daily.   unknown  . ALPRAZolam (XANAX) 1 MG tablet Take 1 mg by mouth every 12 (twelve) hours as needed for anxiety.    02/06/2015  . atorvastatin (LIPITOR) 40 MG tablet Take 40  mg by mouth daily.   On Hold  . feeding supplement, GLUCERNA SHAKE, (GLUCERNA SHAKE) LIQD Take 237 mLs by mouth 2 (two) times daily between meals. 60 Can 0 unknown  . loperamide (IMODIUM) 2 MG capsule Take 1 capsule (2 mg total) by mouth as needed for diarrhea or loose stools. 30 capsule 0 unknown  . nitroGLYCERIN (NITROSTAT) 0.4 MG SL tablet Place 1 tablet (0.4 mg total) under the tongue every 5 (five) minutes as needed for chest pain. 25 tablet 12 02/06/2015  . predniSONE (DELTASONE) 20 MG tablet Take 2 tablets (40 mg total) by mouth daily with breakfast. (Patient not taking: Reported on 02/09/2015)   Taking   Scheduled: . antiseptic oral rinse  7 mL Mouth Rinse BID  . aspirin  325 mg Oral Daily  . Chlorhexidine Gluconate Cloth  6 each Topical Q0600  . erythromycin  1 application Both Eyes QID  . famotidine (PEPCID) IV  20 mg Intravenous Q24H  . heparin subcutaneous  5,000 Units Subcutaneous 3  times per day  . ipratropium-albuterol  3 mL Nebulization Q4H  . levothyroxine  44 mcg Intravenous Daily  . methylPREDNISolone (SOLU-MEDROL) injection  125 mg Intravenous Q6H  . mometasone-formoterol  2 puff Inhalation BID  . mupirocin ointment  1 application Nasal BID  . piperacillin-tazobactam (ZOSYN)  IV  3.375 g Intravenous Q8H  . sodium chloride  10-40 mL Intracatheter Q12H  . vancomycin  1,250 mg Intravenous Q12H   Continuous: . sodium chloride 50 mL/hr at 02/12/15 0600  . propofol Stopped (02/11/15 0840)   OZY:[id number], sodium chloride  Assesment: She was admitted with acute on chronic respiratory failure with hypercapnia and she ended up having to be intubated and placed on mechanical ventilation. She has acute on chronic diastolic heart failure and I think that's doing better. She is doing much better as far as her respiratory failure and was able to be extubated yesterday. She was felt to be septic on admission and was felt to have aspiration pneumonia is the likely cause of her sepsis and respiratory failure. At baseline she has cerebral palsy. She is agitated and has some altered mental status I think metabolic encephalopathy. Principal Problem:   Sepsis-Probable Aspiration in settign recent Hospitalization with CDIFF/Yeast UTI Active Problems:   Infiltrating ductal carcinoma of right breast   Asthma   Hypertension   Hypothyroidism   Acute and chronic respiratory failure with hypercapnia   Encephalopathy, metabolic   Intertriginous skin ulcer, limited to breakdown of skin   Protein-calorie malnutrition, severe   COPD (chronic obstructive pulmonary disease)   Coronary atherosclerosis of native coronary artery   Acute on chronic respiratory failure   Acute on chronic diastolic heart failure    Plan: I will see if we can restart some of her regular medications. Continue other treatments.    LOS: 3 days   Galaxy Borden L 02/12/2015, 7:45 AM

## 2015-02-12 NOTE — Clinical Social Work Note (Signed)
CSW met with patient. Patient was alert. Patient indicated that she wanted her hearing aid. Patient's nurse located patient's hearing aid.  Patient was agreeable to return to [facility] upon discharge.    Ambrose Pancoast, [place]

## 2015-02-13 LAB — GLUCOSE, CAPILLARY
GLUCOSE-CAPILLARY: 149 mg/dL — AB (ref 70–99)
Glucose-Capillary: 171 mg/dL — ABNORMAL HIGH (ref 70–99)

## 2015-02-13 LAB — COMPREHENSIVE METABOLIC PANEL
ALK PHOS: 39 U/L (ref 39–117)
ALT: 12 U/L (ref 0–35)
AST: 11 U/L (ref 0–37)
Albumin: 2.5 g/dL — ABNORMAL LOW (ref 3.5–5.2)
Anion gap: 5 (ref 5–15)
BUN: 8 mg/dL (ref 6–23)
CALCIUM: 6 mg/dL — AB (ref 8.4–10.5)
CO2: 34 mmol/L — ABNORMAL HIGH (ref 19–32)
Chloride: 107 mmol/L (ref 96–112)
Creatinine, Ser: 0.33 mg/dL — ABNORMAL LOW (ref 0.50–1.10)
GLUCOSE: 135 mg/dL — AB (ref 70–99)
POTASSIUM: 3.3 mmol/L — AB (ref 3.5–5.1)
Sodium: 146 mmol/L — ABNORMAL HIGH (ref 135–145)
Total Bilirubin: 0.4 mg/dL (ref 0.3–1.2)
Total Protein: 4.8 g/dL — ABNORMAL LOW (ref 6.0–8.3)

## 2015-02-13 LAB — URINE CULTURE

## 2015-02-13 LAB — CBC WITH DIFFERENTIAL/PLATELET
BASOS PCT: 0 % (ref 0–1)
Basophils Absolute: 0 10*3/uL (ref 0.0–0.1)
EOS ABS: 0 10*3/uL (ref 0.0–0.7)
Eosinophils Relative: 0 % (ref 0–5)
HEMATOCRIT: 30.9 % — AB (ref 36.0–46.0)
Hemoglobin: 9.2 g/dL — ABNORMAL LOW (ref 12.0–15.0)
LYMPHS PCT: 10 % — AB (ref 12–46)
Lymphs Abs: 0.7 10*3/uL (ref 0.7–4.0)
MCH: 27.1 pg (ref 26.0–34.0)
MCHC: 29.8 g/dL — ABNORMAL LOW (ref 30.0–36.0)
MCV: 91.2 fL (ref 78.0–100.0)
MONO ABS: 0.6 10*3/uL (ref 0.1–1.0)
MONOS PCT: 10 % (ref 3–12)
Neutro Abs: 5.2 10*3/uL (ref 1.7–7.7)
Neutrophils Relative %: 80 % — ABNORMAL HIGH (ref 43–77)
PLATELETS: 154 10*3/uL (ref 150–400)
RBC: 3.39 MIL/uL — ABNORMAL LOW (ref 3.87–5.11)
RDW: 19 % — ABNORMAL HIGH (ref 11.5–15.5)
WBC: 6.6 10*3/uL (ref 4.0–10.5)

## 2015-02-13 LAB — CALCIUM, IONIZED: CALCIUM ION: 0.74 mmol/L — AB (ref 1.12–1.32)

## 2015-02-13 LAB — MAGNESIUM: Magnesium: 2.2 mg/dL (ref 1.5–2.5)

## 2015-02-13 MED ORDER — POTASSIUM CHLORIDE CRYS ER 20 MEQ PO TBCR
40.0000 meq | EXTENDED_RELEASE_TABLET | Freq: Three times a day (TID) | ORAL | Status: DC
  Administered 2015-02-13 – 2015-02-14 (×4): 40 meq via ORAL
  Filled 2015-02-13 (×4): qty 2

## 2015-02-13 MED ORDER — INSULIN ASPART 100 UNIT/ML ~~LOC~~ SOLN
0.0000 [IU] | Freq: Three times a day (TID) | SUBCUTANEOUS | Status: DC
  Administered 2015-02-13 – 2015-02-14 (×2): 1 [IU] via SUBCUTANEOUS

## 2015-02-13 MED ORDER — LEVOTHYROXINE SODIUM 88 MCG PO TABS
88.0000 ug | ORAL_TABLET | Freq: Every day | ORAL | Status: DC
  Administered 2015-02-13 – 2015-02-14 (×2): 88 ug via ORAL
  Filled 2015-02-13 (×2): qty 1

## 2015-02-13 MED ORDER — SODIUM CHLORIDE 0.9 % IV SOLN
1.0000 g | Freq: Once | INTRAVENOUS | Status: AC
  Administered 2015-02-13: 1 g via INTRAVENOUS
  Filled 2015-02-13: qty 10

## 2015-02-13 NOTE — Progress Notes (Signed)
The patient is receiving Synthroid by the intravenous route.  Based on criteria approved by the Pharmacy and [place], the medication is being converted to the equivalent oral dose form.  These criteria include: -No Active GI bleeding -Able to tolerate diet of full liquids (or better) or tube feeding OR able to tolerate other medications by the oral or enteral route  If you have any questions about this conversion, please contact the Pharmacy Department (ext [phone]).  Thank you.  Biagio Borg, [facility] 02/13/2015 8:02 AM

## 2015-02-13 NOTE — Progress Notes (Signed)
1.  Pt has a tremor that has affected her EKG readings for the past 2 days. Telemetry leads were replaced several times to correct the issue and her arms were placed on pillows to prevent interference, but the EKG tremor persisted. MD notified at time of transport, and telemetry was discontinued on arrival to Dept. 300.  2. MD addressed high CBG and ordered sensitive sliding scale coverage per Diabetes Coordinator recommendations.  3. Pt transferred to Dept. 300 via bed. All belongings were transferred with her including one hearing aid in her left ear.

## 2015-02-13 NOTE — Progress Notes (Signed)
Subjective: She says she feels better. She has no complaints. She is still a little anxious. Her breathing is okay.  Objective: Vital signs in last 24 hours: Temp:  [97 F (36.1 C)-98.5 F (36.9 C)] 98.5 F (36.9 C) (02/25 0400) Pulse Rate:  [75-122] 89 (02/25 0600) Resp:  [17-44] 24 (02/25 0600) BP: (93-132)/(52-89) 107/77 mmHg (02/25 0600) SpO2:  [94 %-100 %] 95 % (02/25 0655) Weight:  [90.8 kg (200 lb 2.8 oz)] 90.8 kg (200 lb 2.8 oz) (02/25 0500) Weight change: 2.1 kg (4 lb 10.1 oz) Last BM Date: 02/12/15  Intake/Output from previous day: 02/24 0701 - 02/25 0700 In: 2330 [P.O.:480; I.V.:1200; IV Piggyback:650] Out: 3800 [Urine:3800]  PHYSICAL EXAM General appearance: alert and mild distress Resp: clear to auscultation bilaterally Cardio: regular rate and rhythm, S1, S2 normal, no murmur, click, rub or gallop GI: soft, non-tender; bowel sounds normal; no masses,  no organomegaly Extremities: extremities normal, atraumatic, no cyanosis or edema  Lab Results:  Results for orders placed or performed during the hospital encounter of 02/09/15 (from the past 48 hour(s))  Vancomycin, trough     Status: None   Collection Time: 02/11/15  8:51 AM  Result Value Ref Range   Vancomycin Tr 19.7 10.0 - 20.0 ug/mL  Glucose, capillary     Status: Abnormal   Collection Time: 02/11/15  8:53 PM  Result Value Ref Range   Glucose-Capillary 165 (H) 70 - 99 mg/dL  Triglycerides     Status: Abnormal   Collection Time: 02/12/15  4:35 AM  Result Value Ref Range   Triglycerides 229 (H) <150 mg/dL  Blood gas, arterial     Status: Abnormal   Collection Time: 02/12/15  4:45 AM  Result Value Ref Range   O2 Content 3.0 L/min   Delivery systems NASAL CANNULA    pH, Arterial 7.397 7.350 - 7.450   pCO2 arterial 46.0 (H) 35.0 - 45.0 mmHg   pO2, Arterial 87.4 80.0 - 100.0 mmHg   Bicarbonate 27.7 (H) 20.0 - 24.0 mEq/L   TCO2 25.7 0 - 100 mmol/L   Acid-Base Excess 3.2 (H) 0.0 - 2.0 mmol/L   O2  Saturation 96.5 %   Patient temperature 37.0    Collection site RIGHT RADIAL    Drawn by [id number]    Sample type ARTERIAL    Allens test (pass/fail) PASS PASS  CBC with Differential/Platelet     Status: Abnormal   Collection Time: 02/12/15  9:25 AM  Result Value Ref Range   WBC 11.3 (H) 4.0 - 10.5 K/uL   RBC 3.70 (L) 3.87 - 5.11 MIL/uL   Hemoglobin 10.1 (L) 12.0 - 15.0 g/dL   HCT 33.5 (L) 36.0 - 46.0 %   MCV 90.5 78.0 - 100.0 fL   MCH 27.3 26.0 - 34.0 pg   MCHC 30.1 30.0 - 36.0 g/dL   RDW 19.1 (H) 11.5 - 15.5 %   Platelets  150 - 400 K/uL    PLATELET CLUMPS NOTED ON SMEAR, COUNT APPEARS ADEQUATE   Neutrophils Relative % 90 (H) 43 - 77 %   Lymphocytes Relative 5 (L) 12 - 46 %   Monocytes Relative 5 3 - 12 %   Eosinophils Relative 0 0 - 5 %   Basophils Relative 0 0 - 1 %   Neutro Abs 10.1 (H) 1.7 - 7.7 K/uL   Lymphs Abs 0.6 (L) 0.7 - 4.0 K/uL   Monocytes Absolute 0.6 0.1 - 1.0 K/uL   Eosinophils Absolute 0.0 0.0 -  0.7 K/uL   Basophils Absolute 0.0 0.0 - 0.1 K/uL   WBC Morphology MILD LEFT SHIFT (1-5% METAS, OCC MYELO, OCC BANDS)     Comment: SPECIMEN CHECKED FOR CLOTS  Basic metabolic panel     Status: Abnormal   Collection Time: 02/12/15  9:25 AM  Result Value Ref Range   Sodium 144 135 - 145 mmol/L   Potassium 2.9 (L) 3.5 - 5.1 mmol/L   Chloride 115 (H) 96 - 112 mmol/L    Comment: DELTA CHECK NOTED   CO2 25 19 - 32 mmol/L   Glucose, Bld 234 (H) 70 - 99 mg/dL   BUN 13 6 - 23 mg/dL   Creatinine, Ser 0.49 (L) 0.50 - 1.10 mg/dL   Calcium 6.1 (LL) 8.4 - 10.5 mg/dL    Comment: CRITICAL RESULT CALLED TO, READ BACK BY AND VERIFIED WITH: GREY,M AT 10:17AM ON 02/12/15 BY FESTERMAN,C    GFR calc non Af Amer >90 >90 mL/min   GFR calc Af Amer >90 >90 mL/min    Comment: (NOTE) The eGFR has been calculated using the CKD EPI equation. This calculation has not been validated in all clinical situations. eGFR's persistently <90 mL/min signify possible Chronic Kidney Disease.    Anion  gap 4 (L) 5 - 15  Calcium, ionized     Status: Abnormal   Collection Time: 02/12/15 11:57 AM  Result Value Ref Range   Calcium, Ion 0.74 (L) 1.12 - 1.32 mmol/L    Comment: CRITICAL RESULT CALLED TO, READ BACK BY AND VERIFIED WITH: AMANDA KEITH ON [id number] AT 0555 BY RESSEGGER R   Clostridium Difficile by PCR     Status: None   Collection Time: 02/12/15  3:20 PM  Result Value Ref Range   C difficile by pcr NEGATIVE NEGATIVE  CBC with Differential/Platelet     Status: Abnormal   Collection Time: 02/13/15  4:32 AM  Result Value Ref Range   WBC 6.6 4.0 - 10.5 K/uL   RBC 3.39 (L) 3.87 - 5.11 MIL/uL   Hemoglobin 9.2 (L) 12.0 - 15.0 g/dL   HCT 30.9 (L) 36.0 - 46.0 %   MCV 91.2 78.0 - 100.0 fL   MCH 27.1 26.0 - 34.0 pg   MCHC 29.8 (L) 30.0 - 36.0 g/dL   RDW 19.0 (H) 11.5 - 15.5 %   Platelets 154 150 - 400 K/uL   Neutrophils Relative % 80 (H) 43 - 77 %   Neutro Abs 5.2 1.7 - 7.7 K/uL   Lymphocytes Relative 10 (L) 12 - 46 %   Lymphs Abs 0.7 0.7 - 4.0 K/uL   Monocytes Relative 10 3 - 12 %   Monocytes Absolute 0.6 0.1 - 1.0 K/uL   Eosinophils Relative 0 0 - 5 %   Eosinophils Absolute 0.0 0.0 - 0.7 K/uL   Basophils Relative 0 0 - 1 %   Basophils Absolute 0.0 0.0 - 0.1 K/uL  Comprehensive metabolic panel     Status: Abnormal   Collection Time: 02/13/15  4:32 AM  Result Value Ref Range   Sodium 146 (H) 135 - 145 mmol/L   Potassium 3.3 (L) 3.5 - 5.1 mmol/L   Chloride 107 96 - 112 mmol/L   CO2 34 (H) 19 - 32 mmol/L   Glucose, Bld 135 (H) 70 - 99 mg/dL   BUN 8 6 - 23 mg/dL   Creatinine, Ser 0.33 (L) 0.50 - 1.10 mg/dL   Calcium 6.0 (LL) 8.4 - 10.5 mg/dL    Comment: CRITICAL  RESULT CALLED TO, READ BACK BY AND VERIFIED WITH: DANIELS,J AT 6:10AM ON 02/13/15 BY [facility]    Total Protein 4.8 (L) 6.0 - 8.3 g/dL   Albumin 2.5 (L) 3.5 - 5.2 g/dL   AST 11 0 - 37 U/L   ALT 12 0 - 35 U/L   Alkaline Phosphatase 39 39 - 117 U/L   Total Bilirubin 0.4 0.3 - 1.2 mg/dL   GFR calc non Af Amer >90  >90 mL/min   GFR calc Af Amer >90 >90 mL/min    Comment: (NOTE) The eGFR has been calculated using the CKD EPI equation. This calculation has not been validated in all clinical situations. eGFR's persistently <90 mL/min signify possible Chronic Kidney Disease.    Anion gap 5 5 - 15  Magnesium     Status: None   Collection Time: 02/13/15  8:05 AM  Result Value Ref Range   Magnesium 2.2 1.5 - 2.5 mg/dL    ABGS  Recent Labs  02/12/15 0445  PHART 7.397  PO2ART 87.4  TCO2 25.7  HCO3 27.7*   CULTURES Recent Results (from the past 240 hour(s))  Urine culture     Status: None   Collection Time: 02/09/15  9:52 AM  Result Value Ref Range Status   Specimen Description URINE, CATHETERIZED  Final   Special Requests NONE  Final   Colony Count   Final    >=100,000 COLONIES/ML Performed at [place]   Final    ESCHERICHIA COLI Performed at [facility]    Report Status 02/11/2015 FINAL  Final   Organism ID, Bacteria ESCHERICHIA COLI  Final      Susceptibility   Escherichia coli - MIC*    AMPICILLIN >=32 RESISTANT Resistant     CEFAZOLIN <=4 SENSITIVE Sensitive     CEFTRIAXONE <=1 SENSITIVE Sensitive     CIPROFLOXACIN <=0.25 SENSITIVE Sensitive     GENTAMICIN <=1 SENSITIVE Sensitive     LEVOFLOXACIN <=0.12 SENSITIVE Sensitive     NITROFURANTOIN >=512 RESISTANT Resistant     TOBRAMYCIN <=1 SENSITIVE Sensitive     TRIMETH/SULFA <=20 SENSITIVE Sensitive     PIP/TAZO <=4 SENSITIVE Sensitive     * ESCHERICHIA COLI  Culture, blood (routine x 2)     Status: None (Preliminary result)   Collection Time: 02/09/15  1:19 PM  Result Value Ref Range Status   Specimen Description BLOOD LEFT HAND  Final   Special Requests BOTTLES DRAWN AEROBIC ONLY 6CC  Final   Culture NO GROWTH 3 DAYS  Final   Report Status PENDING  Incomplete  MRSA PCR Screening     Status: Abnormal   Collection Time: 02/09/15  2:40 PM  Result Value Ref Range Status   MRSA by PCR  POSITIVE (A) NEGATIVE Final    Comment:        The GeneXpert MRSA Assay (FDA approved for NASAL specimens only), is one component of a comprehensive MRSA colonization surveillance program. It is not intended to diagnose MRSA infection nor to guide or monitor treatment for MRSA infections. RESULT CALLED TO, READ BACK BY AND VERIFIED WITH: E.SPANGLER AT 1714 ON 02/09/15 BY S.VANHOORNE   Culture, respiratory (NON-Expectorated)     Status: None   Collection Time: 02/09/15  3:00 PM  Result Value Ref Range Status   Specimen Description TRACHEAL ASPIRATE  Final   Special Requests NONE  Final   Gram Stain   Final    FEW WBC PRESENT, PREDOMINANTLY PMN NO  SQUAMOUS EPITHELIAL CELLS SEEN NO ORGANISMS SEEN Performed at [facility]    Culture   Final    FEW HAEMOPHILUS INFLUENZAE Note: BETA LACTAMASE POSITIVE Performed at [facility]    Report Status 02/12/2015 FINAL  Final  Urine culture     Status: None   Collection Time: 02/09/15  3:10 PM  Result Value Ref Range Status   Specimen Description URINE, CATHETERIZED  Final   Special Requests NONE  Final   Colony Count   Final    3,000 COLONIES/ML Performed at [facility]    Culture   Final    ESCHERICHIA COLI Performed at [facility]    Report Status 02/13/2015 FINAL  Final   Organism ID, Bacteria ESCHERICHIA COLI  Final      Susceptibility   Escherichia coli - MIC*    AMPICILLIN >=32 RESISTANT Resistant     CEFAZOLIN <=4 SENSITIVE Sensitive     CEFTRIAXONE <=1 SENSITIVE Sensitive     CIPROFLOXACIN <=0.25 SENSITIVE Sensitive     GENTAMICIN <=1 SENSITIVE Sensitive     LEVOFLOXACIN <=0.12 SENSITIVE Sensitive     NITROFURANTOIN 64 INTERMEDIATE Intermediate     TOBRAMYCIN <=1 SENSITIVE Sensitive     TRIMETH/SULFA <=20 SENSITIVE Sensitive     PIP/TAZO <=4 SENSITIVE Sensitive     * ESCHERICHIA COLI  Culture, blood (routine x 2)     Status: None (Preliminary result)   Collection Time: 02/09/15   5:15 PM  Result Value Ref Range Status   Specimen Description BLOOD RIGHT HAND  Final   Special Requests BOTTLES DRAWN AEROBIC ONLY 6CC  Final   Culture NO GROWTH 3 DAYS  Final   Report Status PENDING  Incomplete  Clostridium Difficile by PCR     Status: None   Collection Time: 02/12/15  3:20 PM  Result Value Ref Range Status   C difficile by pcr NEGATIVE NEGATIVE Final   Studies/Results: Dg Chest Port 1 View  02/12/2015   CLINICAL DATA:  Hypoxia, respiratory failure  EXAM: PORTABLE CHEST - 1 VIEW  COMPARISON:  02/11/2015  FINDINGS: Left PICC line tip is in the lower right atrium, approximately 9 cm below the cavoatrial junction. There is cardiomegaly with vascular congestion. Bibasilar opacities likely reflect atelectasis. No visible significant effusion.  IMPRESSION: Left PICC line tip in the lower right atrium approximately 9 cm below the cavoatrial junction.  Cardiomegaly, vascular congestion.  Bibasilar atelectasis.   Electronically Signed   By: Rolm Baptise M.D.   On: 02/12/2015 08:32    Medications:  Prior to Admission:  Prescriptions prior to admission  Medication Sig Dispense Refill Last Dose  . aspirin 325 MG tablet Take 1 tablet (325 mg total) by mouth daily. 30 tablet 12 02/08/2015 at Unknown time  . baclofen (LIORESAL) 10 MG tablet Take 10 mg by mouth 3 (three) times daily.    02/08/2015 at Unknown time  . cholecalciferol (VITAMIN D) 1000 UNITS tablet Take 1,000 Units by mouth daily.   02/08/2015 at Unknown time  . clopidogrel (PLAVIX) 75 MG tablet Take 1 tablet (75 mg total) by mouth daily with breakfast.   02/08/2015 at Unknown time  . Cranberry 450 MG CAPS Take 1 capsule by mouth daily.   02/08/2015 at Unknown time  . Erythromycin 2 % ointment Place 1 application into both eyes 4 (four) times daily. Apply 1/2 inch to each eye for 10 days(started 02/08/15)   02/08/2015 at Unknown time  . furosemide (LASIX) 20 MG tablet  Take 2 tablets (40 mg total) by mouth daily. 30 tablet 12  02/08/2015 at Unknown time  . guaiFENesin (MUCINEX) 600 MG 12 hr tablet Take 600 mg by mouth 2 (two) times daily.   02/08/2015 at Unknown time  . haloperidol (HALDOL) 0.5 MG tablet Take 0.5 mg by mouth daily.    02/08/2015 at Unknown time  . insulin aspart (NOVOLOG) 100 UNIT/ML injection Inject 0-20 Units into the skin 3 (three) times daily with meals. (Patient taking differently: Inject 2-10 Units into the skin 3 (three) times daily with meals. Per sliding scale) 10 mL 11 02/09/2015 at Unknown time  . levothyroxine (SYNTHROID, LEVOTHROID) 88 MCG tablet Take 1 tablet (88 mcg total) by mouth daily before breakfast. 30 tablet 12 02/09/2015 at 600  . loratadine (CLARITIN) 10 MG tablet Take 10 mg by mouth daily.   02/08/2015 at Unknown time  . metoprolol tartrate (LOPRESSOR) 25 MG tablet Take 0.5 tablets (12.5 mg total) by mouth 2 (two) times daily. 180 tablet 3 02/08/2015 at 900 & 2100  . pantoprazole (PROTONIX) 40 MG tablet Take 1 tablet (40 mg total) by mouth daily. 30 tablet 12 02/09/2015 at Unknown time  . polyethylene glycol (MIRALAX / GLYCOLAX) packet Take 17 g by mouth daily.   02/08/2015 at Unknown time  . potassium chloride SA (K-DUR,KLOR-CON) 20 MEQ tablet Take 2 tablets (40 mEq total) by mouth 2 (two) times daily. 60 tablet 12 02/08/2015 at Unknown time  . predniSONE (DELTASONE) 10 MG tablet Take 10 mg by mouth daily with breakfast.   unknown  . ranolazine (RANEXA) 500 MG 12 hr tablet Take 1 tablet (500 mg total) by mouth 2 (two) times daily. 60 tablet 3 02/08/2015 at Unknown time  . saccharomyces boulardii (FLORASTOR) 250 MG capsule Take 250 mg by mouth 2 (two) times daily.   02/08/2015 at Unknown time  . sertraline (ZOLOFT) 100 MG tablet Take 1 tablet (100 mg total) by mouth daily.   02/08/2015 at Unknown time  . temazepam (RESTORIL) 7.5 MG capsule Take 7.5 mg by mouth at bedtime as needed for sleep.   02/06/2015  . traMADol (ULTRAM) 50 MG tablet Take 50 mg by mouth every 6 (six) hours as needed for  moderate pain.   02/05/2015  . acetaminophen (TYLENOL) 325 MG tablet Take 2 tablets (650 mg total) by mouth every 6 (six) hours as needed for mild pain (or Fever >/= 101).   unknown  . albuterol (PROVENTIL HFA;VENTOLIN HFA) 108 (90 BASE) MCG/ACT inhaler Inhale 2 puffs into the lungs every 4 (four) hours as needed. Shortness of breath 1 Inhaler 12 unknown  . albuterol (PROVENTIL) (2.5 MG/3ML) 0.083% nebulizer solution Take 2.5 mg by nebulization 4 (four) times daily.   unknown  . atorvastatin (LIPITOR) 40 MG tablet Take 40 mg by mouth daily.   On Hold  . feeding supplement, GLUCERNA SHAKE, (GLUCERNA SHAKE) LIQD Take 237 mLs by mouth 2 (two) times daily between meals. 60 Can 0 unknown  . loperamide (IMODIUM) 2 MG capsule Take 1 capsule (2 mg total) by mouth as needed for diarrhea or loose stools. 30 capsule 0 unknown  . nitroGLYCERIN (NITROSTAT) 0.4 MG SL tablet Place 1 tablet (0.4 mg total) under the tongue every 5 (five) minutes as needed for chest pain. 25 tablet 12 02/06/2015  . predniSONE (DELTASONE) 20 MG tablet Take 2 tablets (40 mg total) by mouth daily with breakfast. (Patient not taking: Reported on 02/09/2015)   Taking  . [DISCONTINUED] ALPRAZolam (XANAX) 1 MG tablet  Take 1 mg by mouth every 12 (twelve) hours as needed for anxiety.    02/06/2015   Scheduled: . antiseptic oral rinse  7 mL Mouth Rinse BID  . aspirin  325 mg Oral Daily  . atorvastatin  40 mg Oral q1800  . baclofen  10 mg Oral TID  . calcium gluconate  1 g Intravenous Once  . Chlorhexidine Gluconate Cloth  6 each Topical Q0600  . clopidogrel  75 mg Oral Q breakfast  . erythromycin  1 application Both Eyes QID  . famotidine  20 mg Oral q1800  . furosemide  40 mg Intravenous Daily  . guaiFENesin  600 mg Oral BID  . haloperidol  0.5 mg Oral Daily  . heparin subcutaneous  5,000 Units Subcutaneous 3 times per day  . ipratropium-albuterol  3 mL Nebulization Q4H  . levothyroxine  88 mcg Oral QAC breakfast  . methylPREDNISolone  (SOLU-MEDROL) injection  40 mg Intravenous Q6H  . mometasone-formoterol  2 puff Inhalation BID  . mupirocin ointment  1 application Nasal BID  . piperacillin-tazobactam (ZOSYN)  IV  3.375 g Intravenous Q8H  . polyethylene glycol  17 g Oral Daily  . potassium chloride  40 mEq Oral TID  . ranolazine  500 mg Oral BID  . saccharomyces boulardii  250 mg Oral BID  . sertraline  100 mg Oral Daily  . sodium chloride  10-40 mL Intracatheter Q12H  . vancomycin  1,250 mg Intravenous Q12H   Continuous: . sodium chloride 50 mL/hr at 02/13/15 1031   RXY:[id number], ALPRAZolam, nitroGLYCERIN, sodium chloride, temazepam, traMADol  Assesment: She was admitted with healthcare associated pneumonia which is probably aspiration. She is growing Haemophilus in her sputum. She is off the ventilator now for about 48 hours.  She has E coli in the urine which is sensitive to zosyn.   She has some element of heart failure that is pretty well controlled.  Her metabolic encephalopathy has improved  Her albumin is 2.5 and she is known to have protein calorie malnutrition.  She has had multiple electrolyte abnormalities including low calcium and potassium and these are being replaced Principal Problem:   Sepsis-Probable Aspiration in settign recent Hospitalization with CDIFF/Yeast UTI Active Problems:   Infiltrating ductal carcinoma of right breast   Asthma   Hypertension   Hypothyroidism   Acute and chronic respiratory failure with hypercapnia   Encephalopathy, metabolic   Intertriginous skin ulcer, limited to breakdown of skin   Protein-calorie malnutrition, severe   COPD (chronic obstructive pulmonary disease)   Coronary atherosclerosis of native coronary artery   Acute on chronic respiratory failure   Acute on chronic diastolic heart failure    Plan: Discontinue vancomycin. Continue Zosyn. Replace her calcium. Check magnesium level. Increase her potassium replacement. Transfer from ICU.    LOS: 4  days   Brendaliz Kuk L 02/13/2015, 8:38 AM

## 2015-02-13 NOTE — Care Management Utilization Note (Signed)
UR completed 

## 2015-02-14 DIAGNOSIS — J690 Pneumonitis due to inhalation of food and vomit: Secondary | ICD-10-CM | POA: Diagnosis present

## 2015-02-14 LAB — CBC WITH DIFFERENTIAL/PLATELET
BASOS PCT: 0 % (ref 0–1)
Basophils Absolute: 0 10*3/uL (ref 0.0–0.1)
Eosinophils Absolute: 0 10*3/uL (ref 0.0–0.7)
Eosinophils Relative: 0 % (ref 0–5)
HCT: 34.8 % — ABNORMAL LOW (ref 36.0–46.0)
Hemoglobin: 10.2 g/dL — ABNORMAL LOW (ref 12.0–15.0)
Lymphocytes Relative: 8 % — ABNORMAL LOW (ref 12–46)
Lymphs Abs: 0.6 10*3/uL — ABNORMAL LOW (ref 0.7–4.0)
MCH: 26.8 pg (ref 26.0–34.0)
MCHC: 29.3 g/dL — AB (ref 30.0–36.0)
MCV: 91.3 fL (ref 78.0–100.0)
MONOS PCT: 8 % (ref 3–12)
Monocytes Absolute: 0.6 10*3/uL (ref 0.1–1.0)
NEUTROS ABS: 6.6 10*3/uL (ref 1.7–7.7)
NEUTROS PCT: 85 % — AB (ref 43–77)
Platelets: 174 10*3/uL (ref 150–400)
RBC: 3.81 MIL/uL — ABNORMAL LOW (ref 3.87–5.11)
RDW: 19.2 % — AB (ref 11.5–15.5)
WBC: 7.8 10*3/uL (ref 4.0–10.5)

## 2015-02-14 LAB — BASIC METABOLIC PANEL
Anion gap: 8 (ref 5–15)
BUN: 7 mg/dL (ref 6–23)
CO2: 35 mmol/L — ABNORMAL HIGH (ref 19–32)
Calcium: 6.5 mg/dL — ABNORMAL LOW (ref 8.4–10.5)
Chloride: 103 mmol/L (ref 96–112)
Creatinine, Ser: 0.34 mg/dL — ABNORMAL LOW (ref 0.50–1.10)
GFR calc Af Amer: >90 mL/min (ref >90)
GLUCOSE: 148 mg/dL — AB (ref 70–99)
Potassium: 4.7 mmol/L (ref 3.5–5.1)
SODIUM: 146 mmol/L — AB (ref 135–145)

## 2015-02-14 LAB — CULTURE, BLOOD (ROUTINE X 2)
CULTURE: NO GROWTH
CULTURE: NO GROWTH

## 2015-02-14 LAB — GLUCOSE, CAPILLARY
Glucose-Capillary: 127 mg/dL — ABNORMAL HIGH (ref 70–99)
Glucose-Capillary: 120 mg/dL — ABNORMAL HIGH (ref 70–99)

## 2015-02-14 LAB — MAGNESIUM: MAGNESIUM: 2.1 mg/dL (ref 1.5–2.5)

## 2015-02-14 MED ORDER — PREDNISONE 10 MG PO TABS: 10.0000 mg | ORAL_TABLET | Freq: Every day | ORAL | Status: DC

## 2015-02-14 MED ORDER — ALPRAZOLAM 0.5 MG PO TABS: 0.5000 mg | ORAL_TABLET | Freq: Three times a day (TID) | ORAL | Status: AC | PRN

## 2015-02-14 MED ORDER — AMOXICILLIN-POT CLAVULANATE 875-125 MG PO TABS: 1.0000 | ORAL_TABLET | Freq: Two times a day (BID) | ORAL | Status: DC

## 2015-02-14 NOTE — Care Management Utilization Note (Signed)
UR completed 

## 2015-02-14 NOTE — Clinical Social Work Note (Signed)
CSW facilitated discharge.    CSW advised patient's sister, Ruby Cola, and Debbie at [place] of patient's discharge.  CSW advised that patient would be transported back to the facility via ambulance.  CSW arranged transportation through [facility].   CSW signing off.  Ambrose Pancoast, [place]

## 2015-02-14 NOTE — Discharge Summary (Signed)
Physician Discharge Summary  Patient ID: KEATYN LUCK MRN: [id number] DOB/AGE: 26-Apr-1941 74 y.o. Primary Care Physician:Kristia Jupiter L, MD Admit date: 02/09/2015 Discharge date: 02/14/2015    Discharge Diagnoses:   Principal Problem:   Aspiration pneumonia Active Problems:   Infiltrating ductal carcinoma of right breast   Asthma   Hypertension   Hypothyroidism   Sepsis-Probable Aspiration in settign recent Hospitalization with CDIFF/Yeast UTI   HCAP (healthcare-associated pneumonia)   Acute and chronic respiratory failure with hypercapnia   Encephalopathy, metabolic   Intertriginous skin ulcer, limited to breakdown of skin   Protein-calorie malnutrition, severe   COPD (chronic obstructive pulmonary disease)   Coronary atherosclerosis of native coronary artery   Acute on chronic respiratory failure   Acute on chronic diastolic heart failure     Medication List    TAKE these medications        acetaminophen 325 MG tablet  Commonly known as:  TYLENOL  Take 2 tablets (650 mg total) by mouth every 6 (six) hours as needed for mild pain (or Fever >/= 101).     albuterol 108 (90 BASE) MCG/ACT inhaler  Commonly known as:  PROVENTIL HFA;VENTOLIN HFA  Inhale 2 puffs into the lungs every 4 (four) hours as needed. Shortness of breath     albuterol (2.5 MG/3ML) 0.083% nebulizer solution  Commonly known as:  PROVENTIL  Take 2.5 mg by nebulization 4 (four) times daily.     ALPRAZolam 0.5 MG tablet  Commonly known as:  XANAX  Take 1 tablet (0.5 mg total) by mouth 3 (three) times daily as needed for anxiety.     amoxicillin-clavulanate 875-125 MG per tablet  Commonly known as:  AUGMENTIN  Take 1 tablet by mouth 2 (two) times daily.     aspirin 325 MG tablet  Take 1 tablet (325 mg total) by mouth daily.     atorvastatin 40 MG tablet  Commonly known as:  LIPITOR  Take 40 mg by mouth daily.     baclofen 10 MG tablet  Commonly known as:  LIORESAL  Take 10 mg by mouth 3  (three) times daily.     cholecalciferol 1000 UNITS tablet  Commonly known as:  VITAMIN D  Take 1,000 Units by mouth daily.     clopidogrel 75 MG tablet  Commonly known as:  PLAVIX  Take 1 tablet (75 mg total) by mouth daily with breakfast.     Cranberry 450 MG Caps  Take 1 capsule by mouth daily.     Erythromycin 2 % ointment  Place 1 application into both eyes 4 (four) times daily. Apply 1/2 inch to each eye for 10 days(started 02/08/15)     feeding supplement (GLUCERNA SHAKE) Liqd  Take 237 mLs by mouth 2 (two) times daily between meals.     furosemide 20 MG tablet  Commonly known as:  LASIX  Take 2 tablets (40 mg total) by mouth daily.     guaiFENesin 600 MG 12 hr tablet  Commonly known as:  MUCINEX  Take 600 mg by mouth 2 (two) times daily.     haloperidol 0.5 MG tablet  Commonly known as:  HALDOL  Take 0.5 mg by mouth daily.     insulin aspart 100 UNIT/ML injection  Commonly known as:  novoLOG  Inject 0-20 Units into the skin 3 (three) times daily with meals.     levothyroxine 88 MCG tablet  Commonly known as:  SYNTHROID, LEVOTHROID  Take 1 tablet (88 mcg total) by mouth daily  before breakfast.     loperamide 2 MG capsule  Commonly known as:  IMODIUM  Take 1 capsule (2 mg total) by mouth as needed for diarrhea or loose stools.     loratadine 10 MG tablet  Commonly known as:  CLARITIN  Take 10 mg by mouth daily.     metoprolol tartrate 25 MG tablet  Commonly known as:  LOPRESSOR  Take 0.5 tablets (12.5 mg total) by mouth 2 (two) times daily.     nitroGLYCERIN 0.4 MG SL tablet  Commonly known as:  NITROSTAT  Place 1 tablet (0.4 mg total) under the tongue every 5 (five) minutes as needed for chest pain.     pantoprazole 40 MG tablet  Commonly known as:  PROTONIX  Take 1 tablet (40 mg total) by mouth daily.     polyethylene glycol packet  Commonly known as:  MIRALAX / GLYCOLAX  Take 17 g by mouth daily.     potassium chloride SA 20 MEQ tablet  Commonly  known as:  K-DUR,KLOR-CON  Take 2 tablets (40 mEq total) by mouth 2 (two) times daily.     predniSONE 10 MG tablet  Commonly known as:  DELTASONE  Take 10 mg by mouth daily with breakfast.     predniSONE 10 MG tablet  Commonly known as:  DELTASONE  Take 1 tablet (10 mg total) by mouth daily with breakfast. 4x3 d,3x3d,2x3d,1dailyindefinitely     ranolazine 500 MG 12 hr tablet  Commonly known as:  RANEXA  Take 1 tablet (500 mg total) by mouth 2 (two) times daily.     saccharomyces boulardii 250 MG capsule  Commonly known as:  FLORASTOR  Take 250 mg by mouth 2 (two) times daily.     sertraline 100 MG tablet  Commonly known as:  ZOLOFT  Take 1 tablet (100 mg total) by mouth daily.     temazepam 7.5 MG capsule  Commonly known as:  RESTORIL  Take 7.5 mg by mouth at bedtime as needed for sleep.     traMADol 50 MG tablet  Commonly known as:  ULTRAM  Take 50 mg by mouth every 6 (six) hours as needed for moderate pain.        Discharged Condition: Improved    Consults: None  Significant Diagnostic Studies: Dg Chest Port 1 View  02/12/2015   CLINICAL DATA:  Hypoxia, respiratory failure  EXAM: PORTABLE CHEST - 1 VIEW  COMPARISON:  02/11/2015  FINDINGS: Left PICC line tip is in the lower right atrium, approximately 9 cm below the cavoatrial junction. There is cardiomegaly with vascular congestion. Bibasilar opacities likely reflect atelectasis. No visible significant effusion.  IMPRESSION: Left PICC line tip in the lower right atrium approximately 9 cm below the cavoatrial junction.  Cardiomegaly, vascular congestion.  Bibasilar atelectasis.   Electronically Signed   By: Rolm Baptise M.D.   On: 02/12/2015 08:32   Dg Chest Port 1 View  02/11/2015   CLINICAL DATA:  Hypoxia  EXAM: PORTABLE CHEST - 1 VIEW  COMPARISON:  February 10, 2015  FINDINGS: Endotracheal tube tip is 2.9 cm above the carina. Nasogastric tube tip and side port are below the diaphragm. Central catheter tip is in the  right atrium. No pneumothorax. There is a prominent skin fold on the right.  There is patchy airspace consolidation in each lung base, stable. No new opacity. Heart is mildly enlarged with pulmonary vascularity within normal limits, stable.  IMPRESSION: Tube and catheter positions as described without pneumothorax. Bibasilar  airspace consolidation, stable. No new opacity. No change in cardiac silhouette. Prominent skin fold on right but no pneumothorax apparent.   Electronically Signed   By: Lowella Grip III M.D.   On: 02/11/2015 08:08   Dg Chest Port 1 View  02/10/2015   CLINICAL DATA:  Hypoxia/respiratory failure  EXAM: PORTABLE CHEST - 1 VIEW  COMPARISON:  February 09, 2015  FINDINGS: Endotracheal tube tip is 1.9 cm above the carina. Central catheter tip is in right atrium. Nasogastric tube tip and side port are below the diaphragm ; the side port is seen in the stomach region. No pneumothorax. There is patchy consolidation in both lung bases. Lungs elsewhere clear. Heart is mildly enlarged with pulmonary vascularity within normal limits. No adenopathy. There is degenerative change in each shoulder.  IMPRESSION: Tube and catheter positions as described without pneumothorax. Patchy bibasilar airspace consolidation. Heart prominent but stable.   Electronically Signed   By: Lowella Grip III M.D.   On: 02/10/2015 08:18   Dg Chest Portable 1 View  02/09/2015   CLINICAL DATA:  Shortness of breath. Acute respiratory failure and productive cough.  EXAM: PORTABLE CHEST - 1 VIEW  COMPARISON:  01/08/2015  FINDINGS: Heart size is enlarged, unchanged from previous exam. There is diffuse pulmonary vascular congestion. Persistent right lung base opacity which may represent pneumonia or atelectasis.  IMPRESSION: 1. No change in aeration to the right lung base.   Electronically Signed   By: Kerby Moors M.D.   On: 02/09/2015 09:22   Dg Chest Port 1v Same Day  02/09/2015   CLINICAL DATA:  Subsequent evaluation  of pulmonary edema  EXAM: PORTABLE CHEST - 1 VIEW SAME DAY  COMPARISON:  02/09/2015  FINDINGS: Endotracheal tube in unchanged position above the carina and NG tube crosses the gastroesophageal junction.  Stable mild to moderate cardiac enlargement. Moderate vascular congestion.  Increased opacity right lower lobe. Persistent retrocardiac opacity but improved when compared to prior study. Peribronchial cuffing and mild interstitial prominence.  IMPRESSION: Mild interstitial edema. Left lower lobe consolidation improved, with right lower lobe infiltrate mildly worse.   Electronically Signed   By: Skipper Cliche M.D.   On: 02/09/2015 21:22   Dg Chest Port 1v Same Day  02/09/2015   CLINICAL DATA:  Intubation, respiratory failure, acute shortness of breath  EXAM: PORTABLE CHEST - 1 VIEW SAME DAY  COMPARISON:  02/09/2015  FINDINGS: Endotracheal tube 2.9 cm above the carina. NG tube extends below the hemidiaphragms into the stomach. Tip not visualized. Exam is rotated to the left. Heart is enlarged with diffuse vascular congestion versus early edema. Worsening lingula and left lower lobe collapse/consolidation obscures the left hemidiaphragm and left cardiac border. No pneumothorax. Slight improvement right lower lobe atelectasis.  IMPRESSION: Endotracheal tube 2.9 cm above the carina.  Increased lingula and left lower lobe atelectasis/ consolidation  Cardiomegaly with mild edema   Electronically Signed   By: Jerilynn Mages.  Shick M.D.   On: 02/09/2015 14:15    Lab Results: Basic Metabolic Panel:  Recent Labs  02/13/15 0432 02/13/15 0805 02/14/15 0628  NA 146*  --  146*  K 3.3*  --  4.7  CL 107  --  103  CO2 34*  --  35*  GLUCOSE 135*  --  148*  BUN 8  --  7  CREATININE 0.33*  --  0.34*  CALCIUM 6.0*  --  6.5*  MG  --  2.2 2.1   Liver Function Tests:  Recent Labs  02/13/15 0432  AST 11  ALT 12  ALKPHOS 39  BILITOT 0.4  PROT 4.8*  ALBUMIN 2.5*     CBC:  Recent Labs  02/13/15 0432  02/14/15 0628  WBC 6.6 7.8  NEUTROABS 5.2 6.6  HGB 9.2* 10.2*  HCT 30.9* 34.8*  MCV 91.2 91.3  PLT 154 174    Recent Results (from the past 240 hour(s))  Urine culture     Status: None   Collection Time: 02/09/15  9:52 AM  Result Value Ref Range Status   Specimen Description URINE, CATHETERIZED  Final   Special Requests NONE  Final   Colony Count   Final    >=100,000 COLONIES/ML Performed at [facility]   Final    ESCHERICHIA COLI Performed at [facility]    Report Status 02/11/2015 FINAL  Final   Organism ID, Bacteria ESCHERICHIA COLI  Final      Susceptibility   Escherichia coli - MIC*    AMPICILLIN >=32 RESISTANT Resistant     CEFAZOLIN <=4 SENSITIVE Sensitive     CEFTRIAXONE <=1 SENSITIVE Sensitive     CIPROFLOXACIN <=0.25 SENSITIVE Sensitive     GENTAMICIN <=1 SENSITIVE Sensitive     LEVOFLOXACIN <=0.12 SENSITIVE Sensitive     NITROFURANTOIN >=512 RESISTANT Resistant     TOBRAMYCIN <=1 SENSITIVE Sensitive     TRIMETH/SULFA <=20 SENSITIVE Sensitive     PIP/TAZO <=4 SENSITIVE Sensitive     * ESCHERICHIA COLI  Culture, blood (routine x 2)     Status: None   Collection Time: 02/09/15  1:19 PM  Result Value Ref Range Status   Specimen Description BLOOD LEFT HAND  Final   Special Requests BOTTLES DRAWN AEROBIC ONLY 6CC  Final   Culture NO GROWTH 5 DAYS  Final   Report Status 02/14/2015 FINAL  Final  MRSA PCR Screening     Status: Abnormal   Collection Time: 02/09/15  2:40 PM  Result Value Ref Range Status   MRSA by PCR POSITIVE (A) NEGATIVE Final    Comment:        The GeneXpert MRSA Assay (FDA approved for NASAL specimens only), is one component of a comprehensive MRSA colonization surveillance program. It is not intended to diagnose MRSA infection nor to guide or monitor treatment for MRSA infections. RESULT CALLED TO, READ BACK BY AND VERIFIED WITH: E.SPANGLER AT 1714 ON 02/09/15 BY S.VANHOORNE   Culture, respiratory  (NON-Expectorated)     Status: None   Collection Time: 02/09/15  3:00 PM  Result Value Ref Range Status   Specimen Description TRACHEAL ASPIRATE  Final   Special Requests NONE  Final   Gram Stain   Final    FEW WBC PRESENT, PREDOMINANTLY PMN NO SQUAMOUS EPITHELIAL CELLS SEEN NO ORGANISMS SEEN Performed at [facility]    Culture   Final    FEW HAEMOPHILUS INFLUENZAE Note: BETA LACTAMASE POSITIVE Performed at [facility]    Report Status 02/12/2015 FINAL  Final  Urine culture     Status: None   Collection Time: 02/09/15  3:10 PM  Result Value Ref Range Status   Specimen Description URINE, CATHETERIZED  Final   Special Requests NONE  Final   Colony Count   Final    3,000 COLONIES/ML Performed at [facility]    Culture   Final    ESCHERICHIA COLI Performed at [facility]    Report Status 02/13/2015 FINAL  Final   Organism  ID, Bacteria ESCHERICHIA COLI  Final      Susceptibility   Escherichia coli - MIC*    AMPICILLIN >=32 RESISTANT Resistant     CEFAZOLIN <=4 SENSITIVE Sensitive     CEFTRIAXONE <=1 SENSITIVE Sensitive     CIPROFLOXACIN <=0.25 SENSITIVE Sensitive     GENTAMICIN <=1 SENSITIVE Sensitive     LEVOFLOXACIN <=0.12 SENSITIVE Sensitive     NITROFURANTOIN 64 INTERMEDIATE Intermediate     TOBRAMYCIN <=1 SENSITIVE Sensitive     TRIMETH/SULFA <=20 SENSITIVE Sensitive     PIP/TAZO <=4 SENSITIVE Sensitive     * ESCHERICHIA COLI  Culture, blood (routine x 2)     Status: None   Collection Time: 02/09/15  5:15 PM  Result Value Ref Range Status   Specimen Description BLOOD RIGHT HAND  Final   Special Requests BOTTLES DRAWN AEROBIC ONLY 6CC  Final   Culture NO GROWTH 5 DAYS  Final   Report Status 02/14/2015 FINAL  Final  Clostridium Difficile by PCR     Status: None   Collection Time: 02/12/15  3:20 PM  Result Value Ref Range Status   C difficile by pcr NEGATIVE NEGATIVE Final     Hospital Course: This is a 74 year old who has  been in the hospital on multiple occasions recently with respiratory failure. She was brought to the emergency department after being found to have increased shortness of breath and altered mental status and was found to again be in respiratory failure. She had changes on her x-ray suggesting aspiration pneumonia. She initially was treated with BiPAP and then had worsening problems and required intubation and mechanical ventilation. She was on the ventilator for about 36 hours and then eventually was able to come off. She had some element of heart failure and that was treated as well. She grew Haemophilus in her sputum and Escherichia coli in her urine. She has improved dramatically over the last 48 hours. She had hypokalemia which has been treated and hypocalcemia which has also been treated. She is back at baseline at the time of discharge.  Discharge Exam: Blood pressure 124/72, pulse 76, temperature 97.3 F (36.3 C), temperature source Oral, resp. rate 22, height 5\' 4"  (1.626 m), weight 90.8 kg (200 lb 2.8 oz), SpO2 97 %. She is awake and alert. She is hard of hearing. Her chest is clear now. Her heart is regular  Disposition: Return to skilled care facility. I ordered Xanax for anxiety. I think she's been on something for anxiety in the past but I did not see it on her medication list from the nursing home. If this has been discontinued it would be okay to leave the Xanax off. She will be on a prednisone taper and once she has tapered and she will remain on 10 mg daily. She will be on Augmentin 875 mg twice a day with food for 5 days. She will resume physical therapy occupational and speech therapy as needed. She will be on oxygen at 2-4 L/m to maintain O2 saturation in the 92% range      Discharge Instructions    Discharge to SNF when bed available    Complete by:  As directed              Signed: Dreana Britz L   02/14/2015, 8:22 AM

## 2015-02-14 NOTE — Progress Notes (Signed)
Subjective: She looks much better. She doesn't have her hearing aids in so she is having difficulty communicating with no new complaints however. She looks comfortable.  Objective: Vital signs in last 24 hours: Temp:  [97.3 F (36.3 C)-98.7 F (37.1 C)] 97.3 F (36.3 C) (02/26 1751) Pulse Rate:  [76-105] 76 (02/26 0632) Resp:  [21-22] 22 (02/26 0632) BP: (111-128)/(69-100) 124/72 mmHg (02/26 0632) SpO2:  [95 %-99 %] 97 % (02/26 0755) Weight change:  Last BM Date: 02/12/15  Intake/Output from previous day: 02/25 0701 - 02/26 0700 In: 1366.7 [P.O.:1080; I.V.:126.7; IV Piggyback:160] Out: 2800 [Urine:2800]  PHYSICAL EXAM General appearance: alert, cooperative and no distress Resp: clear to auscultation bilaterally Cardio: regular rate and rhythm, S1, S2 normal, no murmur, click, rub or gallop GI: soft, non-tender; bowel sounds normal; no masses,  no organomegaly Extremities: extremities normal, atraumatic, no cyanosis or edema  Lab Results:  Results for orders placed or performed during the hospital encounter of 02/09/15 (from the past 48 hour(s))  CBC with Differential/Platelet     Status: Abnormal   Collection Time: 02/12/15  9:25 AM  Result Value Ref Range   WBC 11.3 (H) 4.0 - 10.5 K/uL   RBC 3.70 ([name]) 3.87 - 5.11 MIL/uL   Hemoglobin 10.1 ([name]) 12.0 - 15.0 g/dL   HCT 33.5 ([name]) 36.0 - 46.0 %   MCV 90.5 78.0 - 100.0 fL   MCH 27.3 26.0 - 34.0 pg   MCHC 30.1 30.0 - 36.0 g/dL   RDW 19.1 (H) 11.5 - 15.5 %   Platelets  150 - 400 K/uL    PLATELET CLUMPS NOTED ON SMEAR, COUNT APPEARS ADEQUATE   Neutrophils Relative % 90 (H) 43 - 77 %   Lymphocytes Relative 5 ([name]) 12 - 46 %   Monocytes Relative 5 3 - 12 %   Eosinophils Relative 0 0 - 5 %   Basophils Relative 0 0 - 1 %   Neutro Abs 10.1 (H) 1.7 - 7.7 K/uL   Lymphs Abs 0.6 ([name]) 0.7 - 4.0 K/uL   Monocytes Absolute 0.6 0.1 - 1.0 K/uL   Eosinophils Absolute 0.0 0.0 - 0.7 K/uL   Basophils Absolute 0.0 0.0 - 0.1 K/uL   WBC Morphology  MILD LEFT SHIFT (1-5% METAS, OCC MYELO, OCC BANDS)     Comment: SPECIMEN CHECKED FOR CLOTS  Basic metabolic panel     Status: Abnormal   Collection Time: 02/12/15  9:25 AM  Result Value Ref Range   Sodium 144 135 - 145 mmol/[name]   Potassium 2.9 ([name]) 3.5 - 5.1 mmol/[name]   Chloride 115 (H) 96 - 112 mmol/[name]    Comment: DELTA CHECK NOTED   CO2 25 19 - 32 mmol/[name]   Glucose, Bld 234 (H) 70 - 99 mg/dL   BUN 13 6 - 23 mg/dL   Creatinine, Ser 0.49 ([name]) 0.50 - 1.10 mg/dL   Calcium 6.1 (LL) 8.4 - 10.5 mg/dL    Comment: CRITICAL RESULT CALLED TO, READ BACK BY AND VERIFIED WITH: GREY,M AT 10:17AM ON 02/12/15 BY FESTERMAN,C    GFR calc non Af Amer >90 >90 mL/min   GFR calc Af Amer >90 >90 mL/min    Comment: (NOTE) The eGFR has been calculated using the CKD EPI equation. This calculation has not been validated in all clinical situations. eGFR's persistently <90 mL/min signify possible Chronic Kidney Disease.    Anion gap 4 ([name]) 5 - 15  Calcium, ionized     Status: Abnormal   Collection Time:  02/12/15 11:57 AM  Result Value Ref Range   Calcium, Ion 0.74 ([name]) 1.12 - 1.32 mmol/[name]    Comment: CRITICAL RESULT CALLED TO, READ BACK BY AND VERIFIED WITH: Bartlett ON [id number] AT 0555 BY RESSEGGER R   Clostridium Difficile by PCR     Status: None   Collection Time: 02/12/15  3:20 PM  Result Value Ref Range   C difficile by pcr NEGATIVE NEGATIVE  CBC with Differential/Platelet     Status: Abnormal   Collection Time: 02/13/15  4:32 AM  Result Value Ref Range   WBC 6.6 4.0 - 10.5 K/uL   RBC 3.39 ([name]) 3.87 - 5.11 MIL/uL   Hemoglobin 9.2 ([name]) 12.0 - 15.0 g/dL   HCT 30.9 ([name]) 36.0 - 46.0 %   MCV 91.2 78.0 - 100.0 fL   MCH 27.1 26.0 - 34.0 pg   MCHC 29.8 ([name]) 30.0 - 36.0 g/dL   RDW 19.0 (H) 11.5 - 15.5 %   Platelets 154 150 - 400 K/uL   Neutrophils Relative % 80 (H) 43 - 77 %   Neutro Abs 5.2 1.7 - 7.7 K/uL   Lymphocytes Relative 10 ([name]) 12 - 46 %   Lymphs Abs 0.7 0.7 - 4.0 K/uL   Monocytes Relative 10 3 - 12 %    Monocytes Absolute 0.6 0.1 - 1.0 K/uL   Eosinophils Relative 0 0 - 5 %   Eosinophils Absolute 0.0 0.0 - 0.7 K/uL   Basophils Relative 0 0 - 1 %   Basophils Absolute 0.0 0.0 - 0.1 K/uL  Comprehensive metabolic panel     Status: Abnormal   Collection Time: 02/13/15  4:32 AM  Result Value Ref Range   Sodium 146 (H) 135 - 145 mmol/[name]   Potassium 3.3 ([name]) 3.5 - 5.1 mmol/[name]   Chloride 107 96 - 112 mmol/[name]   CO2 34 (H) 19 - 32 mmol/[name]   Glucose, Bld 135 (H) 70 - 99 mg/dL   BUN 8 6 - 23 mg/dL   Creatinine, Ser 0.33 ([name]) 0.50 - 1.10 mg/dL   Calcium 6.0 (LL) 8.4 - 10.5 mg/dL    Comment: CRITICAL RESULT CALLED TO, READ BACK BY AND VERIFIED WITH: DANIELS,J AT 6:10AM ON 02/13/15 BY FESTERMAN,C    Total Protein 4.8 ([name]) 6.0 - 8.3 g/dL   Albumin 2.5 ([name]) 3.5 - 5.2 g/dL   AST 11 0 - 37 U/[name]   ALT 12 0 - 35 U/[name]   Alkaline Phosphatase 39 39 - 117 U/[name]   Total Bilirubin 0.4 0.3 - 1.2 mg/dL   GFR calc non Af Amer >90 >90 mL/min   GFR calc Af Amer >90 >90 mL/min    Comment: (NOTE) The eGFR has been calculated using the CKD EPI equation. This calculation has not been validated in all clinical situations. eGFR's persistently <90 mL/min signify possible Chronic Kidney Disease.    Anion gap 5 5 - 15  Magnesium     Status: None   Collection Time: 02/13/15  8:05 AM  Result Value Ref Range   Magnesium 2.2 1.5 - 2.5 mg/dL  Glucose, capillary     Status: Abnormal   Collection Time: 02/13/15  4:44 PM  Result Value Ref Range   Glucose-Capillary 149 (H) 70 - 99 mg/dL   Comment 1 Notify RN    Comment 2 Document in Chart   Glucose, capillary     Status: Abnormal   Collection Time: 02/13/15  8:42 PM  Result Value Ref Range   Glucose-Capillary  171 (H) 70 - 99 mg/dL   Comment 1 Notify RN   Basic metabolic panel     Status: Abnormal   Collection Time: 02/14/15  6:28 AM  Result Value Ref Range   Sodium 146 (H) 135 - 145 mmol/[name]   Potassium 4.7 3.5 - 5.1 mmol/[name]    Comment: DELTA CHECK NOTED   Chloride 103 96 -  112 mmol/[name]   CO2 35 (H) 19 - 32 mmol/[name]   Glucose, Bld 148 (H) 70 - 99 mg/dL   BUN 7 6 - 23 mg/dL   Creatinine, Ser 0.34 ([name]) 0.50 - 1.10 mg/dL   Calcium 6.5 ([name]) 8.4 - 10.5 mg/dL   GFR calc non Af Amer >90 >90 mL/min   GFR calc Af Amer >90 >90 mL/min    Comment: (NOTE) The eGFR has been calculated using the CKD EPI equation. This calculation has not been validated in all clinical situations. eGFR's persistently <90 mL/min signify possible Chronic Kidney Disease.    Anion gap 8 5 - 15  CBC with Differential/Platelet     Status: Abnormal   Collection Time: 02/14/15  6:28 AM  Result Value Ref Range   WBC 7.8 4.0 - 10.5 K/uL   RBC 3.81 ([name]) 3.87 - 5.11 MIL/uL   Hemoglobin 10.2 ([name]) 12.0 - 15.0 g/dL   HCT 34.8 ([name]) 36.0 - 46.0 %   MCV 91.3 78.0 - 100.0 fL   MCH 26.8 26.0 - 34.0 pg   MCHC 29.3 ([name]) 30.0 - 36.0 g/dL   RDW 19.2 (H) 11.5 - 15.5 %   Platelets 174 150 - 400 K/uL   Neutrophils Relative % 85 (H) 43 - 77 %   Neutro Abs 6.6 1.7 - 7.7 K/uL   Lymphocytes Relative 8 ([name]) 12 - 46 %   Lymphs Abs 0.6 ([name]) 0.7 - 4.0 K/uL   Monocytes Relative 8 3 - 12 %   Monocytes Absolute 0.6 0.1 - 1.0 K/uL   Eosinophils Relative 0 0 - 5 %   Eosinophils Absolute 0.0 0.0 - 0.7 K/uL   Basophils Relative 0 0 - 1 %   Basophils Absolute 0.0 0.0 - 0.1 K/uL  Magnesium     Status: None   Collection Time: 02/14/15  6:28 AM  Result Value Ref Range   Magnesium 2.1 1.5 - 2.5 mg/dL    ABGS  Recent Labs  02/12/15 0445  PHART 7.397  PO2ART 87.4  TCO2 25.7  HCO3 27.7*   CULTURES Recent Results (from the past 240 hour(s))  Urine culture     Status: None   Collection Time: 02/09/15  9:52 AM  Result Value Ref Range Status   Specimen Description URINE, CATHETERIZED  Final   Special Requests NONE  Final   Colony Count   Final    >=100,000 COLONIES/ML Performed at [place]   Final    ESCHERICHIA COLI Performed at [facility]    Report Status 02/11/2015 FINAL  Final    Organism ID, Bacteria ESCHERICHIA COLI  Final      Susceptibility   Escherichia coli - MIC*    AMPICILLIN >=32 RESISTANT Resistant     CEFAZOLIN <=4 SENSITIVE Sensitive     CEFTRIAXONE <=1 SENSITIVE Sensitive     CIPROFLOXACIN <=0.25 SENSITIVE Sensitive     GENTAMICIN <=1 SENSITIVE Sensitive     LEVOFLOXACIN <=0.12 SENSITIVE Sensitive     NITROFURANTOIN >=512 RESISTANT Resistant     TOBRAMYCIN <=1 SENSITIVE Sensitive  TRIMETH/SULFA <=20 SENSITIVE Sensitive     PIP/TAZO <=4 SENSITIVE Sensitive     * ESCHERICHIA COLI  Culture, blood (routine x 2)     Status: None   Collection Time: 02/09/15  1:19 PM  Result Value Ref Range Status   Specimen Description BLOOD LEFT HAND  Final   Special Requests BOTTLES DRAWN AEROBIC ONLY [place]  Final   Culture NO GROWTH 5 DAYS  Final   Report Status 02/14/2015 FINAL  Final  MRSA PCR Screening     Status: Abnormal   Collection Time: 02/09/15  2:40 PM  Result Value Ref Range Status   MRSA by PCR POSITIVE (A) NEGATIVE Final    Comment:        The GeneXpert MRSA Assay (FDA approved for NASAL specimens only), is one component of a comprehensive MRSA colonization surveillance program. It is not intended to diagnose MRSA infection nor to guide or monitor treatment for MRSA infections. RESULT CALLED TO, READ BACK BY AND VERIFIED WITH: E.SPANGLER AT 1714 ON 02/09/15 BY S.VANHOORNE   Culture, respiratory (NON-Expectorated)     Status: None   Collection Time: 02/09/15  3:00 PM  Result Value Ref Range Status   Specimen Description TRACHEAL ASPIRATE  Final   Special Requests NONE  Final   Gram Stain   Final    FEW WBC PRESENT, PREDOMINANTLY PMN NO SQUAMOUS EPITHELIAL CELLS SEEN NO ORGANISMS SEEN Performed at [facility]    Culture   Final    FEW HAEMOPHILUS INFLUENZAE Note: BETA LACTAMASE POSITIVE Performed at [facility]    Report Status 02/12/2015 FINAL  Final  Urine culture     Status: None   Collection Time: 02/09/15   3:10 PM  Result Value Ref Range Status   Specimen Description URINE, CATHETERIZED  Final   Special Requests NONE  Final   Colony Count   Final    3,000 COLONIES/ML Performed at [facility]    Culture   Final    ESCHERICHIA COLI Performed at [facility]    Report Status 02/13/2015 FINAL  Final   Organism ID, Bacteria ESCHERICHIA COLI  Final      Susceptibility   Escherichia coli - MIC*    AMPICILLIN >=32 RESISTANT Resistant     CEFAZOLIN <=4 SENSITIVE Sensitive     CEFTRIAXONE <=1 SENSITIVE Sensitive     CIPROFLOXACIN <=0.25 SENSITIVE Sensitive     GENTAMICIN <=1 SENSITIVE Sensitive     LEVOFLOXACIN <=0.12 SENSITIVE Sensitive     NITROFURANTOIN 64 INTERMEDIATE Intermediate     TOBRAMYCIN <=1 SENSITIVE Sensitive     TRIMETH/SULFA <=20 SENSITIVE Sensitive     PIP/TAZO <=4 SENSITIVE Sensitive     * ESCHERICHIA COLI  Culture, blood (routine x 2)     Status: None   Collection Time: 02/09/15  5:15 PM  Result Value Ref Range Status   Specimen Description BLOOD RIGHT HAND  Final   Special Requests BOTTLES DRAWN AEROBIC ONLY 6CC  Final   Culture NO GROWTH 5 DAYS  Final   Report Status 02/14/2015 FINAL  Final  Clostridium Difficile by PCR     Status: None   Collection Time: 02/12/15  3:20 PM  Result Value Ref Range Status   C difficile by pcr NEGATIVE NEGATIVE Final   Studies/Results: Dg Chest Port 1 View  02/12/2015   CLINICAL DATA:  Hypoxia, respiratory failure  EXAM: PORTABLE CHEST - 1 VIEW  COMPARISON:  02/11/2015  FINDINGS: Left PICC line tip is  in the lower right atrium, approximately 9 cm below the cavoatrial junction. There is cardiomegaly with vascular congestion. Bibasilar opacities likely reflect atelectasis. No visible significant effusion.  IMPRESSION: Left PICC line tip in the lower right atrium approximately 9 cm below the cavoatrial junction.  Cardiomegaly, vascular congestion.  Bibasilar atelectasis.   Electronically Signed   By: Rolm Baptise M.D.    On: 02/12/2015 08:32    Medications:  Prior to Admission:  Prescriptions prior to admission  Medication Sig Dispense Refill Last Dose  . aspirin 325 MG tablet Take 1 tablet (325 mg total) by mouth daily. 30 tablet 12 02/08/2015 at Unknown time  . baclofen (LIORESAL) 10 MG tablet Take 10 mg by mouth 3 (three) times daily.    02/08/2015 at Unknown time  . cholecalciferol (VITAMIN D) 1000 UNITS tablet Take 1,000 Units by mouth daily.   02/08/2015 at Unknown time  . clopidogrel (PLAVIX) 75 MG tablet Take 1 tablet (75 mg total) by mouth daily with breakfast.   02/08/2015 at Unknown time  . Cranberry 450 MG CAPS Take 1 capsule by mouth daily.   02/08/2015 at Unknown time  . Erythromycin 2 % ointment Place 1 application into both eyes 4 (four) times daily. Apply 1/2 inch to each eye for 10 days(started 02/08/15)   02/08/2015 at Unknown time  . furosemide (LASIX) 20 MG tablet Take 2 tablets (40 mg total) by mouth daily. 30 tablet 12 02/08/2015 at Unknown time  . guaiFENesin (MUCINEX) 600 MG 12 hr tablet Take 600 mg by mouth 2 (two) times daily.   02/08/2015 at Unknown time  . haloperidol (HALDOL) 0.5 MG tablet Take 0.5 mg by mouth daily.    02/08/2015 at Unknown time  . insulin aspart (NOVOLOG) 100 UNIT/ML injection Inject 0-20 Units into the skin 3 (three) times daily with meals. (Patient taking differently: Inject 2-10 Units into the skin 3 (three) times daily with meals. Per sliding scale) 10 mL 11 02/09/2015 at Unknown time  . levothyroxine (SYNTHROID, LEVOTHROID) 88 MCG tablet Take 1 tablet (88 mcg total) by mouth daily before breakfast. 30 tablet 12 02/09/2015 at 600  . loratadine (CLARITIN) 10 MG tablet Take 10 mg by mouth daily.   02/08/2015 at Unknown time  . metoprolol tartrate (LOPRESSOR) 25 MG tablet Take 0.5 tablets (12.5 mg total) by mouth 2 (two) times daily. 180 tablet 3 02/08/2015 at 900 & 2100  . pantoprazole (PROTONIX) 40 MG tablet Take 1 tablet (40 mg total) by mouth daily. 30 tablet 12 02/09/2015  at Unknown time  . polyethylene glycol (MIRALAX / GLYCOLAX) packet Take 17 g by mouth daily.   02/08/2015 at Unknown time  . potassium chloride SA (K-DUR,KLOR-CON) 20 MEQ tablet Take 2 tablets (40 mEq total) by mouth 2 (two) times daily. 60 tablet 12 02/08/2015 at Unknown time  . predniSONE (DELTASONE) 10 MG tablet Take 10 mg by mouth daily with breakfast.   unknown  . ranolazine (RANEXA) 500 MG 12 hr tablet Take 1 tablet (500 mg total) by mouth 2 (two) times daily. 60 tablet 3 02/08/2015 at Unknown time  . saccharomyces boulardii (FLORASTOR) 250 MG capsule Take 250 mg by mouth 2 (two) times daily.   02/08/2015 at Unknown time  . sertraline (ZOLOFT) 100 MG tablet Take 1 tablet (100 mg total) by mouth daily.   02/08/2015 at Unknown time  . temazepam (RESTORIL) 7.5 MG capsule Take 7.5 mg by mouth at bedtime as needed for sleep.   02/06/2015  . traMADol Veatrice Bourbon)  50 MG tablet Take 50 mg by mouth every 6 (six) hours as needed for moderate pain.   02/05/2015  . acetaminophen (TYLENOL) 325 MG tablet Take 2 tablets (650 mg total) by mouth every 6 (six) hours as needed for mild pain (or Fever >/= 101).   unknown  . albuterol (PROVENTIL HFA;VENTOLIN HFA) 108 (90 BASE) MCG/ACT inhaler Inhale 2 puffs into the lungs every 4 (four) hours as needed. Shortness of breath 1 Inhaler 12 unknown  . albuterol (PROVENTIL) (2.5 MG/3ML) 0.083% nebulizer solution Take 2.5 mg by nebulization 4 (four) times daily.   unknown  . atorvastatin (LIPITOR) 40 MG tablet Take 40 mg by mouth daily.   On Hold  . feeding supplement, GLUCERNA SHAKE, (GLUCERNA SHAKE) LIQD Take 237 mLs by mouth 2 (two) times daily between meals. 60 Can 0 unknown  . loperamide (IMODIUM) 2 MG capsule Take 1 capsule (2 mg total) by mouth as needed for diarrhea or loose stools. 30 capsule 0 unknown  . nitroGLYCERIN (NITROSTAT) 0.4 MG SL tablet Place 1 tablet (0.4 mg total) under the tongue every 5 (five) minutes as needed for chest pain. 25 tablet 12 02/06/2015  .  predniSONE (DELTASONE) 20 MG tablet Take 2 tablets (40 mg total) by mouth daily with breakfast. (Patient not taking: Reported on 02/09/2015)   Taking  . [DISCONTINUED] ALPRAZolam (XANAX) 1 MG tablet Take 1 mg by mouth every 12 (twelve) hours as needed for anxiety.    02/06/2015   Scheduled: . antiseptic oral rinse  7 mL Mouth Rinse BID  . aspirin  325 mg Oral Daily  . atorvastatin  40 mg Oral q1800  . baclofen  10 mg Oral TID  . Chlorhexidine Gluconate Cloth  6 each Topical Q0600  . clopidogrel  75 mg Oral Q breakfast  . erythromycin  1 application Both Eyes QID  . famotidine  20 mg Oral q1800  . furosemide  40 mg Intravenous Daily  . guaiFENesin  600 mg Oral BID  . haloperidol  0.5 mg Oral Daily  . heparin subcutaneous  5,000 Units Subcutaneous 3 times per day  . insulin aspart  0-9 Units Subcutaneous TID WC  . ipratropium-albuterol  3 mL Nebulization Q4H  . levothyroxine  88 mcg Oral QAC breakfast  . methylPREDNISolone (SOLU-MEDROL) injection  40 mg Intravenous Q6H  . mometasone-formoterol  2 puff Inhalation BID  . mupirocin ointment  1 application Nasal BID  . piperacillin-tazobactam (ZOSYN)  IV  3.375 g Intravenous Q8H  . polyethylene glycol  17 g Oral Daily  . potassium chloride  40 mEq Oral TID  . ranolazine  500 mg Oral BID  . saccharomyces boulardii  250 mg Oral BID  . sertraline  100 mg Oral Daily  . sodium chloride  10-40 mL Intracatheter Q12H   Continuous: . sodium chloride 50 mL/hr at 02/13/15 1132   ALP:[id number], ALPRAZolam, nitroGLYCERIN, sodium chloride, temazepam, traMADol  Assesment: She was admitted with acute on chronic respiratory failure with hypercapnia requiring ventilator support. She has now been off the ventilator about 72 hours and is doing well. She had aspiration pneumonia/healthcare associated pneumonia. She initially had metabolic encephalopathy and that is much improved. At baseline she has COPD and that is stable. She had some element of acute on  chronic diastolic heart failure as well. She has an Escherichia coli urinary tract infection and she is growing Haemophilus in her sputum. She is back at baseline and I think she can be transferred back to her skilled care  facility Principal Problem:   Sepsis-Probable Aspiration in settign recent Hospitalization with CDIFF/Yeast UTI Active Problems:   Infiltrating ductal carcinoma of right breast   Asthma   Hypertension   Hypothyroidism   Acute and chronic respiratory failure with hypercapnia   Encephalopathy, metabolic   Intertriginous skin ulcer, limited to breakdown of skin   Protein-calorie malnutrition, severe   COPD (chronic obstructive pulmonary disease)   Coronary atherosclerosis of native coronary artery   Acute on chronic respiratory failure   Acute on chronic diastolic heart failure    Plan: Discharge back to skilled care facility today    LOS: 5 days   [name] 02/14/2015, 8:19 AM

## 2015-02-19 ENCOUNTER — Ambulatory Visit: Payer: Medicare Other | Admitting: Cardiovascular Disease

## 2015-02-19 ENCOUNTER — Encounter: Payer: Self-pay | Admitting: *Deleted

## 2015-03-31 ENCOUNTER — Ambulatory Visit (INDEPENDENT_AMBULATORY_CARE_PROVIDER_SITE_OTHER): Payer: Medicare Other | Admitting: Cardiovascular Disease

## 2015-03-31 ENCOUNTER — Encounter: Payer: Self-pay | Admitting: Cardiovascular Disease

## 2015-03-31 VITALS — BP 112/70 | HR 85 | Ht 65.0 in | Wt 178.0 lb

## 2015-03-31 DIAGNOSIS — R079 Chest pain, unspecified: Secondary | ICD-10-CM

## 2015-03-31 DIAGNOSIS — I639 Cerebral infarction, unspecified: Secondary | ICD-10-CM

## 2015-03-31 DIAGNOSIS — Z87898 Personal history of other specified conditions: Secondary | ICD-10-CM

## 2015-03-31 DIAGNOSIS — R000 Tachycardia, unspecified: Secondary | ICD-10-CM | POA: Diagnosis not present

## 2015-03-31 DIAGNOSIS — I252 Old myocardial infarction: Secondary | ICD-10-CM | POA: Diagnosis not present

## 2015-03-31 DIAGNOSIS — Z9289 Personal history of other medical treatment: Secondary | ICD-10-CM

## 2015-03-31 DIAGNOSIS — I10 Essential (primary) hypertension: Secondary | ICD-10-CM | POA: Diagnosis not present

## 2015-03-31 NOTE — Addendum Note (Signed)
Addended by: Barbarann Ehlers A on: 03/31/2015 03:20 PM   Modules accepted: Orders, Medications, Level of Service

## 2015-03-31 NOTE — Progress Notes (Signed)
Patient ID: [name], female   DOB: 07-20-1941, 73 y.o.   MRN: [id number]      SUBJECTIVE: The patient returns for routine follow up. She has a history of chronic respiratory failure/COPD and is on oxygen, hypertension, cerebral palsy, NSTEMI, and CVA. She was hospitalized in 1/16 for acute on chronic respiratory failure requiring mechanical ventilation and was diagnosed with healthcare associated pneumonia, candida UTI, and clostridium difficile colitis as per the medical records.   She was again hospitalized after her last visit with me in February for aspiration pneumonia.  At a prior visit on 11/28/14, I initiated Ranexa 500 mg bid and this has alleviated her chest pain. She has only taken 1 SL nitroglycerin in the last few months. Nuclear stress test on 12/18/14 showed no evidence of ischemia with soft tissue attenuation artifact noted, LVEF 72%. She currently denies chest pain. She seldom has palpitations. Breathing is stable.  Labs on 4/6 demonstrate white blood cells 5.7, hemoglobin 11, platelets 195, sodium 144, potassium 4.6, BUN 11.3, creatinine 0.37.  Review of Systems: As per "subjective", otherwise negative.  Allergies  Allergen Reactions  . Latex     Provided via MAR  . Naproxen Other (See Comments)    Unknown-Provided via MAR    Current Outpatient Prescriptions  Medication Sig Dispense Refill  . acetaminophen (TYLENOL) 325 MG tablet Take 2 tablets (650 mg total) by mouth every 6 (six) hours as needed for mild pain (or Fever >/= 101).    Marland Kitchen albuterol (PROVENTIL HFA;VENTOLIN HFA) 108 (90 BASE) MCG/ACT inhaler Inhale 2 puffs into the lungs every 4 (four) hours as needed. Shortness of breath 1 Inhaler 12  . albuterol (PROVENTIL) (2.5 MG/3ML) 0.083% nebulizer solution Take 2.5 mg by nebulization 4 (four) times daily.    Marland Kitchen ALPRAZolam (XANAX) 0.5 MG tablet Take 1 tablet (0.5 mg total) by mouth 3 (three) times daily as needed for anxiety.  0  . aspirin 325 MG tablet Take  1 tablet (325 mg total) by mouth daily. 30 tablet 12  . atorvastatin (LIPITOR) 40 MG tablet Take 40 mg by mouth daily.    . baclofen (LIORESAL) 10 MG tablet Take 10 mg by mouth 3 (three) times daily.     . cholecalciferol (VITAMIN D) 1000 UNITS tablet Take 1,000 Units by mouth daily.    . clopidogrel (PLAVIX) 75 MG tablet Take 1 tablet (75 mg total) by mouth daily with breakfast.    . Cranberry 450 MG CAPS Take 1 capsule by mouth daily.    . Erythromycin 2 % ointment Place 1 application into both eyes 4 (four) times daily. Apply 1/2 inch to each eye for 10 days(started 02/08/15)    . feeding supplement, GLUCERNA SHAKE, (GLUCERNA SHAKE) LIQD Take 237 mLs by mouth 2 (two) times daily between meals. 60 Can 0  . furosemide (LASIX) 20 MG tablet Take 2 tablets (40 mg total) by mouth daily. 30 tablet 12  . haloperidol (HALDOL) 0.5 MG tablet Take 0.5 mg by mouth daily.     . insulin aspart (NOVOLOG) 100 UNIT/ML injection Inject 0-20 Units into the skin 3 (three) times daily with meals. (Patient taking differently: Inject 2-10 Units into the skin 3 (three) times daily with meals. Per sliding scale) 10 mL 11  . levothyroxine (SYNTHROID, LEVOTHROID) 88 MCG tablet Take 1 tablet (88 mcg total) by mouth daily before breakfast. 30 tablet 12  . loperamide (IMODIUM) 2 MG capsule Take 1 capsule (2 mg total) by mouth as needed for  diarrhea or loose stools. 30 capsule 0  . loratadine (CLARITIN) 10 MG tablet Take 10 mg by mouth daily.    . metoprolol tartrate (LOPRESSOR) 25 MG tablet Take 0.5 tablets (12.5 mg total) by mouth 2 (two) times daily. 180 tablet 3  . nitroGLYCERIN (NITROSTAT) 0.4 MG SL tablet Place 1 tablet (0.4 mg total) under the tongue every 5 (five) minutes as needed for chest pain. 25 tablet 12  . pantoprazole (PROTONIX) 40 MG tablet Take 1 tablet (40 mg total) by mouth daily. 30 tablet 12  . polyethylene glycol (MIRALAX / GLYCOLAX) packet Take 17 g by mouth daily.    . potassium chloride SA  (K-DUR,KLOR-CON) 20 MEQ tablet Take 2 tablets (40 mEq total) by mouth 2 (two) times daily. 60 tablet 12  . ranolazine (RANEXA) 500 MG 12 hr tablet Take 1 tablet (500 mg total) by mouth 2 (two) times daily. 60 tablet 3  . saccharomyces boulardii (FLORASTOR) 250 MG capsule Take 250 mg by mouth 2 (two) times daily.    . sertraline (ZOLOFT) 100 MG tablet Take 1 tablet (100 mg total) by mouth daily.    . temazepam (RESTORIL) 7.5 MG capsule Take 7.5 mg by mouth at bedtime as needed for sleep.    . traMADol (ULTRAM) 50 MG tablet Take 50 mg by mouth every 6 (six) hours as needed for moderate pain.    Marland Kitchen guaiFENesin (MUCINEX) 600 MG 12 hr tablet Take 600 mg by mouth 2 (two) times daily.    . predniSONE (DELTASONE) 10 MG tablet Take 1 tablet (10 mg total) by mouth daily with breakfast. 4x3 d,3x3d,2x3d,1dailyindefinitely     No current facility-administered medications for this visit.    Past Medical History  Diagnosis Date  . Cerebral palsy   . Asthma   . Seasonal allergies   . Thyroid disease   . HTN (hypertension)   . Anxiety   . Arthritis   . Breast cancer     Right breast, infiltrating ductal.  . Anginal pain   . Osteoporosis 05/08/2012  . Osteoporosis 05/08/2012  . Stroke   . Angina at rest   . Chronic steroid use   . Pneumonia 09/2013  . Dysphagia   . Hypopotassemia   . Respiratory failure   . Generalized weakness   . Urinary retention   . Dysphagia   . Acute MI   . Hypopotassemia   . Sepsis   . Neurogenic bladder   . Reflux   . Obesity   . Staph infection January 2016  . Clostridium difficile infection January 2016  . Acute respiratory failure     Past Surgical History  Procedure Laterality Date  . Breast lumpectomy    . Radical abdominal hysterectomy    . Dental extraction    . Right hand surgery      History   Social History  . Marital Status: Widowed    Spouse Name: N/A  . Number of Children: N/A  . Years of Education: college   Occupational History  .  disabled    Social History Main Topics  . Smoking status: Never Smoker   . Smokeless tobacco: Never Used  . Alcohol Use: No  . Drug Use: No  . Sexual Activity: No   Other Topics Concern  . Not on file   Social History Narrative   Known history cerebral palsy-spastic type   Used to live at home until 2014 and declined further therefore transferred to nursing facility 2014   Sisters  next of kin     Filed Vitals:   03/31/15 1439  BP: 112/70  Pulse: 85  Height: 5\' 5"  (1.651 m)  Weight: 178 lb (80.74 kg)  SpO2: 99%    PHYSICAL EXAM General: NAD HEENT: Tardive dyskinesia. Neck: No JVD, no thyromegaly. Lungs: Diminished sounds b/l with no rales or wheezes. CV: Nondisplaced PMI.Tachycardic, regular rhythm, normal S1/S2, no S3/S4, no murmur. No pretibial or periankle edema.  Abdomen: Soft, nontender, no distention.  Neurologic: Alert and oriented.  Psych: Normal affect. Skin: Normal. Musculoskeletal: No gross deformities.  ECG: Most recent ECG reviewed.      ASSESSMENT AND PLAN: 1. Chest pain in the context of prior NSTEMI which occurred in the setting of acute respiratory failure secondary to influenza A and a healthcare associated pneumonia: Nuclear MPI study results from 12/18/14 noted above. Given symptom reduction, I will continue Ranexa 500 mg bid. Tachycardia controlled on low-dose metoprolol. She is also on ASA, Plavix, and Lipitor. I will reduce ASA to 81 mg daily.   2. Tachycardia: Continue low-dose metoprolol.  Dispo: f/u 6 months.   Kate Sable, M.D., F.A.C.C.

## 2015-03-31 NOTE — Patient Instructions (Signed)
Your physician wants you to follow-up in: 6 months with Dr Virgina Jock will receive a reminder letter in the mail two months in advance. If you don't receive a letter, please call our office to schedule the follow-up appointment.    Reduce aspirin to 81 mg daily      Thank you for choosing [facility] !

## 2015-07-22 ENCOUNTER — Other Ambulatory Visit (HOSPITAL_COMMUNITY): Payer: Self-pay

## 2015-07-22 DIAGNOSIS — M810 Age-related osteoporosis without current pathological fracture: Secondary | ICD-10-CM

## 2015-08-01 ENCOUNTER — Encounter (HOSPITAL_COMMUNITY): Payer: Medicare Other | Attending: Hematology & Oncology

## 2015-08-01 ENCOUNTER — Encounter (HOSPITAL_COMMUNITY): Payer: Self-pay

## 2015-08-01 ENCOUNTER — Encounter (HOSPITAL_COMMUNITY): Payer: Medicare Other

## 2015-08-01 VITALS — BP 93/70 | HR 81 | Temp 98.0°F | Resp 20

## 2015-08-01 DIAGNOSIS — M810 Age-related osteoporosis without current pathological fracture: Secondary | ICD-10-CM

## 2015-08-01 LAB — COMPREHENSIVE METABOLIC PANEL
ALBUMIN: 3.3 g/dL — AB (ref 3.5–5.0)
ALT: 12 U/L — ABNORMAL LOW (ref 14–54)
AST: 15 U/L (ref 15–41)
Alkaline Phosphatase: 57 U/L (ref 38–126)
Anion gap: 5 (ref 5–15)
BILIRUBIN TOTAL: 0.4 mg/dL (ref 0.3–1.2)
BUN: 17 mg/dL (ref 6–20)
CO2: 42 mmol/L — AB (ref 22–32)
Calcium: 8.9 mg/dL (ref 8.9–10.3)
Chloride: 94 mmol/L — ABNORMAL LOW (ref 101–111)
Creatinine, Ser: 0.44 mg/dL (ref 0.44–1.00)
GFR calc non Af Amer: >60 mL/min (ref >60)
GLUCOSE: 84 mg/dL (ref 65–99)
Potassium: 4.1 mmol/L (ref 3.5–5.1)
Sodium: 141 mmol/L (ref 135–145)
Total Protein: 5.8 g/dL — ABNORMAL LOW (ref 6.5–8.1)

## 2015-08-01 MED ORDER — DENOSUMAB 60 MG/ML ~~LOC~~ SOLN
60.0000 mg | Freq: Once | SUBCUTANEOUS | Status: AC
  Administered 2015-08-01: 60 mg via SUBCUTANEOUS
  Filled 2015-08-01: qty 1

## 2015-08-01 NOTE — Progress Notes (Signed)
[name]'s reason for visit today is for an injection and labs as scheduled per MD orders.  Labs were drawn prior to administration of ordered medication.  Alexah Candelaria Stagers also received prolia  per MD orders; see [facility] for administration details.  [name] tolerated all procedures well and without incident; questions were answered and patient was discharged.

## 2015-08-01 NOTE — Patient Instructions (Signed)
[place] at [facility] Discharge Instructions  RECOMMENDATIONS MADE BY THE CONSULTANT AND ANY TEST RESULTS WILL BE SENT TO YOUR REFERRING PHYSICIAN.  prolia today Follow up as scheduled Please call the clinic if you have any questions or concerns   Thank you for choosing [place] at [facility] to provide your oncology and hematology care.  To afford each patient quality time with our provider, please arrive at least 15 minutes before your scheduled appointment time.    You need to re-schedule your appointment should you arrive 10 or more minutes late.  We strive to give you quality time with our providers, and arriving late affects you and other patients whose appointments are after yours.  Also, if you no show three or more times for appointments you may be dismissed from the clinic at the providers discretion.     Again, thank you for choosing [facility].  Our hope is that these requests will decrease the amount of time that you wait before being seen by our physicians.       _____________________________________________________________  Should you have questions after your visit to [facility], please contact our office at [phone] between the hours of 8:30 a.m. and 4:30 p.m.  Voicemails left after 4:30 p.m. will not be returned until the following business day.  For prescription refill requests, have your pharmacy contact our office.

## 2015-11-05 ENCOUNTER — Ambulatory Visit (HOSPITAL_COMMUNITY)
Admission: RE | Admit: 2015-11-05 | Discharge: 2015-11-05 | Disposition: A | Discharge status: Home / Self Care | Payer: Medicare Other | Source: Ambulatory Visit | Attending: Internal Medicine | Admitting: Internal Medicine

## 2015-11-05 DIAGNOSIS — J189 Pneumonia, unspecified organism: Secondary | ICD-10-CM | POA: Insufficient documentation

## 2015-11-05 NOTE — Progress Notes (Signed)
   11/05/15 1524  Medical Necessity for Transport Certificate --- IF THIS TRANSPORT IS ROUND TRIP OR SCHEDULED AND REPEATED, A PHYSICIAN MUST COMPLETE THIS FORM  Transport to (Location) Other (Comment)  Reason for Transport Discharge (here for PICC placement)  Name of [facility]  Round Trip Transport? No  Q1 Are ALL the following "true" for this patient?              1. Unable to get up from bed without assistance  AND            2. Unable to ambulate AND        3. Unable to sit in a chair, including a wheelchair. Yes  Q2 Could the patient be transported safely by other means of transportation (I.E., wheelchair van)? No  Reason for transport - patient condition Requires continuous oxygen  Q3 Reason based on Facility and/or Services Transport to nearest appropriate facility  Hydrographic surveyor (can be: Physician, RN, NP, PA, DP, CNS) G. Arma Reining, Therapist, sports

## 2015-11-05 NOTE — Progress Notes (Signed)
Peripherally Inserted Central Catheter/Midline Placement  The IV Nurse has discussed with the patient and/or persons authorized to consent for the patient, the purpose of this procedure and the potential benefits and risks involved with this procedure.  The benefits include less needle sticks, lab draws from the catheter and patient may be discharged home with the catheter.  Risks include, but not limited to, infection, bleeding, blood clot (thrombus formation), and puncture of an artery; nerve damage and irregular heat beat.  Alternatives to this procedure were also discussed.  PICC/Midline Placement Documentation  PICC / Midline Single Lumen Q000111Q PICC Left Basilic 42 cm 0 cm (Active)  Indication for Insertion or Continuance of Line Home intravenous therapies (PICC only) 11/05/2015  3:16 PM  Exposed Catheter (cm) 0 cm 11/05/2015  3:16 PM  Site Assessment Clean;Dry;Intact 11/05/2015  3:16 PM  Line Status Blood return noted;Infusing 11/05/2015  3:16 PM  Dressing Type Transparent 11/05/2015  3:16 PM  Dressing Status Clean;Dry;Intact;Antimicrobial disc in place 11/05/2015  3:16 PM  Line Care Connections checked and tightened 11/05/2015  3:16 PM  Line Adjustment (NICU/IV Team Only) No 11/05/2015  3:16 PM  Dressing Intervention New dressing 11/05/2015  3:16 PM   Tip confirmed using 3CG technology.   Charm Barges S 11/05/2015, 3:23 PM

## 2015-11-05 NOTE — Discharge Instructions (Signed)
PICC Insertion, Care After Refer to this sheet in the next few weeks. These instructions provide you with information on caring for yourself after your procedure. Your health care provider may also give you more specific instructions. Your treatment has been planned according to current medical practices, but problems sometimes occur. Call your health care provider if you have any problems or questions after your procedure. WHAT TO EXPECT AFTER THE PROCEDURE After your procedure, it is typical to have the following:  Mild discomfort at the insertion site. This should not last more than a day. HOME CARE INSTRUCTIONS  Rest at home for the remainder of the day after the procedure.  You may bend your arm and move it freely. If your PICC is near or at the bend of your elbow, avoid activity with repeated motion at the elbow.  Avoid lifting heavy objects as instructed by your health care provider.  Avoid using a crutch with the arm on the same side as your PICC. You may need to use a walker. Bandage Care  Keep your PICC bandage (dressing) clean and dry to prevent infection.  Ask your health care provider when you may shower. To keep the dressing dry, cover the PICC with plastic wrap and tape before showering. If the dressing does become wet, replace it right after the shower.  Do not soak in the bath, swim, or use hot tubs when you have a PICC.  Change the PICC dressing as instructed by your health care provider.  Change your PICC dressing if it becomes loose or wet. General PICC Care  Check the PICC insertion site daily for leakage, redness, swelling, or pain.  Flush the PICC as directed by your health care provider. Let your health care provider know right away if the PICC is difficult to flush or does not flush. Do not use force to flush the PICC.  Do not use a syringe that is less than 10 mL to flush the PICC.  Never pull or tug on the PICC.  Avoid blood pressure checks on the arm  with the PICC.  Keep your PICC identification card with you at all times.  Do not take the PICC out yourself. Only a trained health care professional should remove the PICC. SEEK MEDICAL CARE IF:  You have pain in your arm, ear, face, or teeth.  You have fever or chills.  You have drainage from the PICC insertion site.  You have redness or palpate a "cord" around the PICC insertion site.  You cannot flush the catheter. SEEK IMMEDIATE MEDICAL CARE IF:  You have swelling in the arm in which the PICC is inserted.   This information is not intended to replace advice given to you by your health care provider. Make sure you discuss any questions you have with your health care provider.   Document Released: 09/26/2013 Document Revised: 12/11/2013 Document Reviewed: 09/26/2013 Elsevier Interactive Patient Education 2016 [place] A peripherally inserted central catheter (PICC) is a long, thin, flexible tube that is inserted into a vein in the upper arm. It is a form of intravenous (IV) access. It is considered to be a "central" line because the tip of the PICC ends in a large vein in your chest. This large vein is called the superior vena cava (SVC). The PICC tip ends in the SVC because there is a lot of blood flow in the SVC. This allows medicines and IV fluids to be quickly distributed throughout the body. The  PICC is inserted using a sterile technique by a specially trained nurse or physician. After the PICC is inserted, a chest X-ray exam is done to be sure it is in the correct place.  A PICC may be placed for different reasons, such as:  To give medicines and liquid nutrition that can only be given through a central line. Examples are:  Certain antibiotic treatments.  Chemotherapy.  Total parenteral nutrition (TPN).  To take frequent blood samples.  To give IV fluids and blood products.  If there is difficulty placing a peripheral intravenous (PIV)  catheter. If taken care of properly, a PICC can remain in place for several months. A PICC can also allow a person to go home from the hospital early. Medicine and PICC care can be managed at home by a family member or home health care team. [place] A PICC? Problems with a PICC can occasionally occur. These may include the following:  A blood clot (thrombus) forming in or at the tip of the PICC. This can cause the PICC to become clogged. A clot-dissolving medicine called tissue plasminogen activator (tPA) can be given through the PICC to help break up the clot.  Inflammation of the vein (phlebitis) in which the PICC is placed. Signs of inflammation may include redness, pain at the insertion site, red streaks, or being able to feel a "cord" in the vein where the PICC is located.  Infection in the PICC or at the insertion site. Signs of infection may include fever, chills, redness, swelling, or pus drainage from the PICC insertion site.  PICC movement (malposition). The PICC tip may move from its original position due to excessive physical activity, forceful coughing, sneezing, or vomiting.  A break or cut in the PICC. It is important to not use scissors near the PICC.  Nerve or tendon irritation or injury during PICC insertion. WHAT SHOULD I KEEP IN MIND ABOUT ACTIVITIES WHEN I HAVE A PICC?  You may bend your arm and move it freely. If your PICC is near or at the bend of your elbow, avoid activity with repeated motion at the elbow.  Rest at home for the remainder of the day following PICC line insertion.  Avoid lifting heavy objects as instructed by your health care provider.  Avoid using a crutch with the arm on the same side as your PICC. You may need to use a walker. WHAT SHOULD I KNOW ABOUT MY PICC DRESSING?  Keep your PICC bandage (dressing) clean and dry to prevent infection.  Ask your health care provider when you may shower. Ask your health care  provider to teach you how to wrap the PICC when you do take a shower.  Change the PICC dressing as instructed by your health care provider.  Change your PICC dressing if it becomes loose or wet. WHAT SHOULD I KNOW ABOUT PICC CARE?  Check the PICC insertion site daily for leakage, redness, swelling, or pain.  Do not take a bath, swim, or use hot tubs when you have a PICC. Cover PICC line with clear plastic wrap and tape to keep it dry while showering.  Flush the PICC as directed by your health care provider. Let your health care provider know right away if the PICC is difficult to flush or does not flush. Do not use force to flush the PICC.  Do not use a syringe that is less than 10 mL to flush the PICC.  Never pull or  tug on the PICC.  Avoid blood pressure checks on the arm with the PICC.  Keep your PICC identification card with you at all times.  Do not take the PICC out yourself. Only a trained clinical professional should remove the PICC. SEEK IMMEDIATE MEDICAL CARE IF:  Your PICC is accidentally pulled all the way out. If this happens, cover the insertion site with a bandage or gauze dressing. Do not throw the PICC away. Your health care provider will need to inspect it.  Your PICC was tugged or pulled and has partially come out. Do not  push the PICC back in.  There is any type of drainage, redness, or swelling where the PICC enters the skin.  You cannot flush the PICC, it is difficult to flush, or the PICC leaks around the insertion site when it is flushed.  You hear a "flushing" sound when the PICC is flushed.  You have pain, discomfort, or numbness in your arm, shoulder, or jaw on the same side as the PICC.  You feel your heart "racing" or skipping beats.  You notice a hole or tear in the PICC.  You develop chills or a fever. MAKE SURE YOU:   Understand these instructions.  Will watch your condition.  Will get help right away if you are not doing well or get  worse.   This information is not intended to replace advice given to you by your health care provider. Make sure you discuss any questions you have with your health care provider.   Document Released: 06/12/2003 Document Revised: 12/27/2014 Document Reviewed: 08/13/2013 Elsevier Interactive Patient Education 2016 [place] Catheter/Midline Placement  The IV Nurse has discussed with the patient and/or persons authorized to consent for the patient, the purpose of this procedure and the potential benefits and risks involved with this procedure.  The benefits include less needle sticks, lab draws from the catheter and patient may be discharged home with the catheter.  Risks include, but not limited to, infection, bleeding, blood clot (thrombus formation), and puncture of an artery; nerve damage and irregular heat beat.  Alternatives to this procedure were also discussed.  PICC/Midline Placement Documentation  PICC / Midline Single Lumen Q000111Q PICC Left Basilic 42 cm 0 cm (Active)  Indication for Insertion or Continuance of Line Home intravenous therapies (PICC only) 11/05/2015  3:16 PM  Exposed Catheter (cm) 0 cm 11/05/2015  3:16 PM  Site Assessment Clean;Dry;Intact 11/05/2015  3:16 PM  Line Status Blood return noted;Infusing 11/05/2015  3:16 PM  Dressing Type Transparent 11/05/2015  3:16 PM  Dressing Status Clean;Dry;Intact;Antimicrobial disc in place 11/05/2015  3:16 PM  Line Care Connections checked and tightened 11/05/2015  3:16 PM  Line Adjustment (NICU/IV Team Only) No 11/05/2015  3:16 PM  Dressing Intervention New dressing 11/05/2015  3:16 PM       Charm Barges S 11/05/2015, 3:25 PM

## 2015-12-23 ENCOUNTER — Emergency Department (HOSPITAL_COMMUNITY)
Admission: EM | Admit: 2015-12-23 | Discharge: 2015-12-24 | Disposition: A | Discharge status: Home / Self Care | Payer: Medicare Other | Attending: Emergency Medicine | Admitting: Emergency Medicine

## 2015-12-23 ENCOUNTER — Emergency Department (HOSPITAL_COMMUNITY): Payer: Medicare Other

## 2015-12-23 ENCOUNTER — Encounter (HOSPITAL_COMMUNITY): Payer: Self-pay | Admitting: Emergency Medicine

## 2015-12-23 DIAGNOSIS — I252 Old myocardial infarction: Secondary | ICD-10-CM | POA: Diagnosis not present

## 2015-12-23 DIAGNOSIS — M1990 Unspecified osteoarthritis, unspecified site: Secondary | ICD-10-CM | POA: Insufficient documentation

## 2015-12-23 DIAGNOSIS — Z853 Personal history of malignant neoplasm of breast: Secondary | ICD-10-CM | POA: Insufficient documentation

## 2015-12-23 DIAGNOSIS — M7981 Nontraumatic hematoma of soft tissue: Secondary | ICD-10-CM | POA: Diagnosis not present

## 2015-12-23 DIAGNOSIS — Z79899 Other long term (current) drug therapy: Secondary | ICD-10-CM | POA: Insufficient documentation

## 2015-12-23 DIAGNOSIS — Z8673 Personal history of transient ischemic attack (TIA), and cerebral infarction without residual deficits: Secondary | ICD-10-CM | POA: Insufficient documentation

## 2015-12-23 DIAGNOSIS — K219 Gastro-esophageal reflux disease without esophagitis: Secondary | ICD-10-CM | POA: Diagnosis not present

## 2015-12-23 DIAGNOSIS — E079 Disorder of thyroid, unspecified: Secondary | ICD-10-CM | POA: Diagnosis not present

## 2015-12-23 DIAGNOSIS — Z452 Encounter for adjustment and management of vascular access device: Secondary | ICD-10-CM | POA: Diagnosis not present

## 2015-12-23 DIAGNOSIS — Z7982 Long term (current) use of aspirin: Secondary | ICD-10-CM | POA: Diagnosis not present

## 2015-12-23 DIAGNOSIS — Z8619 Personal history of other infectious and parasitic diseases: Secondary | ICD-10-CM | POA: Insufficient documentation

## 2015-12-23 DIAGNOSIS — J45909 Unspecified asthma, uncomplicated: Secondary | ICD-10-CM | POA: Diagnosis not present

## 2015-12-23 DIAGNOSIS — Z91040 Latex allergy status: Secondary | ICD-10-CM | POA: Insufficient documentation

## 2015-12-23 DIAGNOSIS — E669 Obesity, unspecified: Secondary | ICD-10-CM | POA: Diagnosis not present

## 2015-12-23 DIAGNOSIS — Z87448 Personal history of other diseases of urinary system: Secondary | ICD-10-CM | POA: Diagnosis not present

## 2015-12-23 DIAGNOSIS — Z7952 Long term (current) use of systemic steroids: Secondary | ICD-10-CM | POA: Insufficient documentation

## 2015-12-23 DIAGNOSIS — C50911 Malignant neoplasm of unspecified site of right female breast: Secondary | ICD-10-CM | POA: Insufficient documentation

## 2015-12-23 DIAGNOSIS — C50919 Malignant neoplasm of unspecified site of unspecified female breast: Secondary | ICD-10-CM | POA: Insufficient documentation

## 2015-12-23 DIAGNOSIS — F419 Anxiety disorder, unspecified: Secondary | ICD-10-CM | POA: Diagnosis not present

## 2015-12-23 DIAGNOSIS — Z8701 Personal history of pneumonia (recurrent): Secondary | ICD-10-CM | POA: Insufficient documentation

## 2015-12-23 DIAGNOSIS — Z7902 Long term (current) use of antithrombotics/antiplatelets: Secondary | ICD-10-CM | POA: Insufficient documentation

## 2015-12-23 HISTORY — DX: Malignant neoplasm of unspecified site of unspecified female breast: C50.919

## 2015-12-23 HISTORY — DX: Malignant neoplasm of unspecified site of right female breast: C50.911

## 2015-12-23 NOTE — ED Provider Notes (Signed)
CSN: [id number]     Arrival date & time 12/23/15  2249 History  By signing my name below, I, [name], attest that this documentation has been prepared under the direction and in the presence of Tracy Porter, MD at 2340. Electronically Signed: Hansel [name], ED Scribe. 12/23/2015. 11:46 PM.    Chief Complaint  Patient presents with  . remove picc    The history is provided by the patient. No language interpreter was used.    LEVEL 5 CAVEAT: HPI and ROS limited due to confusion    HPI Comments: [name] is a 75 y.o. female with h/o cerebral palsy, asthma, HTN, stroke, chronic steroid use, hypopotassemia, respiratory failure, acute MI, neurogenic bladder, obesity who presents to the Emergency Department due to a problem with her PICC line. Pt states she feels otherwise well, but does not know why she is here. Per nursing facility, her line was halfway out with surrounding ecchymosis and erythema. Pt is from [facility] and is a poor historian.   PCP is Dr. Luan Pulling   Past Medical History  Diagnosis Date  . Cerebral palsy ([place])   . Asthma   . Seasonal allergies   . Thyroid disease   . HTN (hypertension)   . Anxiety   . Arthritis   . Anginal pain ([place])   . Osteoporosis 05/08/2012  . Osteoporosis 05/08/2012  . Stroke ([place])   . Angina at rest [facility])   . Chronic steroid use   . Pneumonia 09/2013  . Dysphagia   . Hypopotassemia   . Respiratory failure ([place])   . Generalized weakness   . Urinary retention   . Dysphagia   . Acute MI ([place])   . Hypopotassemia   . Sepsis ([place])   . Neurogenic bladder   . Reflux   . Obesity   . Staph infection January 2016  . Clostridium difficile infection January 2016  . Acute respiratory failure ([place])   . Breast cancer ([place])     Right breast, infiltrating ductal.  . Breast cancer, right breast ([place] [facility])   . Breast cancer ([place])   . Breast cancer, right breast ([place])   . Breast CA [facility])    Past Surgical History  Procedure Laterality Date  .  Breast lumpectomy    . Radical abdominal hysterectomy    . Dental extraction    . Right hand surgery     Family History  Problem Relation Age of Onset  . Cancer    . Diabetes    . Arthritis    . Asthma    . Heart attack Father   . Heart attack Mother    Social History  Substance Use Topics  . Smoking status: Never Smoker   . Smokeless tobacco: Never Used  . Alcohol Use: No  living in NH  OB History    No data available     Review of Systems  Unable to perform ROS: Other   Allergies  Latex and Naproxen  Home Medications   Prior to Admission medications   Medication Sig Start Date End Date Taking? Authorizing Provider  acetaminophen (TYLENOL) 500 MG tablet Take 1,000 mg by mouth every 6 (six) hours as needed for mild pain or moderate pain.   Yes Historical Provider, MD  albuterol (ACCUNEB) 0.63 MG/3ML nebulizer solution Take 1 ampule by nebulization every 6 (six) hours.   Yes Historical Provider, MD  albuterol (PROVENTIL HFA;VENTOLIN HFA) 108 (90 BASE) MCG/ACT inhaler Inhale 2 puffs into the lungs  every 4 (four) hours as needed. Shortness of breath 06/13/13  Yes Sinda Du, MD  ALPRAZolam [name]) 0.5 MG tablet Take 1 tablet (0.5 mg total) by mouth 3 (three) times daily as needed for anxiety. Patient taking differently: Take 0.25 mg by mouth every 6 (six) hours.  02/14/15  Yes Sinda Du, MD  aspirin EC 81 MG tablet Take 81 mg by mouth daily.   Yes Historical Provider, MD  atorvastatin (LIPITOR) 40 MG tablet Take 40 mg by mouth daily.   Yes Historical Provider, MD  baclofen (LIORESAL) 10 MG tablet Take 10 mg by mouth 3 (three) times daily.    Yes Historical Provider, MD  calcium gluconate 500 MG tablet Take 1 tablet by mouth 2 (two) times daily.   Yes Historical Provider, MD  ceFEPIme (MAXIPIME) 1 GM/50ML SOLN Inject 1 g into the vein every 8 (eight) hours. 7 day course starting on 12/23/15 12/23/15  Yes Historical Provider, MD  cefTRIAXone (ROCEPHIN) 1 g injection Inject  2 g into the muscle once.   Yes Historical Provider, MD  cholecalciferol (VITAMIN D) 1000 UNITS tablet Take 1,000 Units by mouth daily.   Yes Historical Provider, MD  clopidogrel (PLAVIX) 75 MG tablet Take 1 tablet (75 mg total) by mouth daily with breakfast. 01/04/14  Yes Sinda Du, MD  Cranberry 450 MG CAPS Take 1 capsule by mouth daily.   Yes Historical Provider, MD  feeding supplement, GLUCERNA SHAKE, (GLUCERNA SHAKE) LIQD Take 237 mLs by mouth 2 (two) times daily between meals. 01/04/15  Yes Sinda Du, MD  furosemide (LASIX) 20 MG tablet Take 2 tablets (40 mg total) by mouth daily. 01/04/15  Yes Sinda Du, MD  gabapentin (NEURONTIN) 100 MG capsule Take 100 mg by mouth 2 (two) times daily.   Yes Historical Provider, MD  guaiFENesin (MUCINEX) 600 MG 12 hr tablet Take 600 mg by mouth 2 (two) times daily.   Yes Historical Provider, MD  insulin aspart (NOVOLOG) 100 UNIT/ML injection Inject 0-20 Units into the skin 3 (three) times daily with meals. Patient taking differently: Inject 2-10 Units into the skin 3 (three) times daily with meals. Per sliding scale 140-200= 2 units  201-250= 4 units 251-300= 6 units 301-350= 8 units 351-400=10units 01/04/15  Yes Sinda Du, MD  levofloxacin (LEVAQUIN) 750 MG/150ML SOLN Inject 750 mg into the vein daily. 7 day course starting on 12/23/15   Yes Historical Provider, MD  levothyroxine (SYNTHROID, LEVOTHROID) 100 MCG tablet Take 100 mcg by mouth daily before breakfast.   Yes Historical Provider, MD  loratadine (CLARITIN) 10 MG tablet Take 10 mg by mouth daily.   Yes Historical Provider, MD  metoprolol tartrate (LOPRESSOR) 25 MG tablet Take 0.5 tablets (12.5 mg total) by mouth 2 (two) times daily. 02/05/15  Yes Herminio Commons, MD  nitroGLYCERIN (NITROSTAT) 0.4 MG SL tablet Place 1 tablet (0.4 mg total) under the tongue every 5 (five) minutes as needed for chest pain. 06/13/13  Yes Sinda Du, MD  nystatin cream (MYCOSTATIN) Apply 1  application topically 3 (three) times daily. Applied to inner thighs and groin area at every shift for skin irritation   Yes Historical Provider, MD  pantoprazole (PROTONIX) 40 MG tablet Take 1 tablet (40 mg total) by mouth daily. 06/13/13  Yes Sinda Du, MD  polyethylene glycol [facility] / GLYCOLAX) packet Take 17 g by mouth daily.   Yes Historical Provider, MD  potassium chloride SA (K-DUR,KLOR-CON) 20 MEQ tablet Take 2 tablets (40 mEq total) by mouth 2 (two)  times daily. 01/04/15  Yes Sinda Du, MD  ranolazine (RANEXA) 500 MG 12 hr tablet Take 1 tablet (500 mg total) by mouth 2 (two) times daily. 11/28/14  Yes Herminio Commons, MD  saccharomyces boulardii (FLORASTOR) 250 MG capsule Take 250 mg by mouth 2 (two) times daily.   Yes Historical Provider, MD  sertraline (ZOLOFT) 100 MG tablet Take 1 tablet (100 mg total) by mouth daily. 08/13/13  Yes Sinda Du, MD  temazepam (RESTORIL) 7.5 MG capsule Take 7.5 mg by mouth at bedtime as needed for sleep.   Yes Historical Provider, MD  traMADol (ULTRAM) 50 MG tablet Take 50 mg by mouth every 6 (six) hours as needed for moderate pain.   Yes Historical Provider, MD  vancomycin 1,000 mg in sodium chloride 0.9 % 250 mL Inject 1,000 mg into the vein daily. 1g IV every 12 hours for 7 days starting on 12/23/15   Yes Historical Provider, MD  vitamin B-12 (CYANOCOBALAMIN) 1000 MCG tablet Take 1,000 mcg by mouth daily.   Yes Historical Provider, MD  haloperidol (HALDOL) 0.5 MG tablet Take 0.5 mg by mouth daily.     Historical Provider, MD   BP 108/80 mmHg  Pulse 83  Temp(Src) 98.9 F (37.2 C) (Oral)  Resp 17  Wt 198 lb (89.812 kg)  SpO2 98%  Vital signs normal   Physical Exam  Constitutional: She appears well-developed and well-nourished.  Non-toxic appearance. She does not appear ill. No distress.  She is alert and pleasant, but doesn't know why she is here.    HENT:  Head: Normocephalic and atraumatic.  Right Ear: External ear normal.   Left Ear: External ear normal.  Nose: Nose normal. No mucosal edema or rhinorrhea.  Mouth/Throat: Oropharynx is clear and moist and mucous membranes are normal. No dental abscesses or uvula swelling.  Eyes: Conjunctivae and EOM are normal. Pupils are equal, round, and reactive to light.  Neck: Normal range of motion and full passive range of motion without pain. Neck supple.  Cardiovascular: Normal rate, regular rhythm and normal heart sounds.  Exam reveals no gallop and no friction rub.   No murmur heard. Pulmonary/Chest: Effort normal and breath sounds normal. No respiratory distress. She has no wheezes. She has no rhonchi. She has no rales. She exhibits no tenderness and no crepitus.  Abdominal: Soft. Normal appearance and bowel sounds are normal. She exhibits no distension. There is no tenderness. There is no rebound and no guarding.  Musculoskeletal: Normal range of motion. She exhibits no edema or tenderness.  She has ulnar deviation of her fingers, worse on the right hand than the left. Moves all extremities well.   Neurological: She is alert. She has normal strength. No cranial nerve deficit.  Skin: Skin is warm, dry and intact. No rash noted. No erythema. No pallor.  She has a PICC in her right upper arm with a large area of bruising around it and it appears that the line has partially been pulled out.  Psychiatric: She has a normal mood and affect. Her speech is normal and behavior is normal. Her mood appears not anxious.  Nursing note and vitals reviewed.   ED Course  Procedures (including critical care time) DIAGNOSTIC STUDIES: Oxygen Saturation is 98% on RA, normal by my interpretation.    COORDINATION OF CARE: 11:40 PM Discussed treatment plan with pt at bedside and pt agreed to plan.  12:56 AM Family arrived; reports that the PICC was placed this afternoon. States she is going  to get antibiotics for pneumonia  01:02 D/W Dr Radene Knee, radiology, states the PICC can be used  until it can be replaced.  Nursing staff was able to flush the PICC without difficulty. It was taped and stabilized in it's current position.   Imaging Review Dg Chest 1 View  12/24/2015  CLINICAL DATA:  PICC placement.  Initial encounter. EXAM: CHEST 1 VIEW COMPARISON:  Chest radiograph performed 02/12/2015 FINDINGS: The patient's right PICC is noted ending about the right brachiocephalic vein just proximal to the superior vena cava. The lungs are hypoexpanded. Vascular congestion is noted, with mild bibasilar opacities, possibly reflecting minimal interstitial edema. There is no evidence of significant pleural effusion or pneumothorax. The cardiomediastinal silhouette is borderline normal in size. No acute osseous abnormalities are seen. IMPRESSION: 1. Right PICC noted ending about the right brachiocephalic vein just proximal to the superior vena cava. 2. Lungs hypoexpanded. Vascular congestion, with mild bibasilar opacities, possibly reflecting minimal interstitial edema. Electronically Signed   By: Garald Balding M.D.   On: 12/24/2015 00:20   I have personally reviewed and evaluated these images as part of my medical decision-making.   MDM   Final diagnoses:  PICC (peripherally inserted central catheter) flush   Plan discharge   Tracy Porter, MD, FACEP    I personally performed the services described in this documentation, which was scribed in my presence. The recorded information has been reviewed and considered.  Tracy Porter, MD, Barbette Or, MD 12/24/15 [phone]

## 2015-12-23 NOTE — ED Notes (Signed)
Pt is from [facility] and needs picc removed. Per nursing facility the line is halfway out.

## 2015-12-24 NOTE — ED Notes (Signed)
Picc line flushed with Normal saline with no problems and stabilized as ordered

## 2015-12-24 NOTE — Discharge Instructions (Signed)
The PICC line can still be used. Have the PICC nurse recheck it soon. It will need to be replaced, but can be used temporarily for now.  Make sure it is stabilized.

## 2016-01-10 ENCOUNTER — Emergency Department (HOSPITAL_COMMUNITY): Payer: Medicare Other

## 2016-01-10 ENCOUNTER — Encounter (HOSPITAL_COMMUNITY): Payer: Self-pay

## 2016-01-10 ENCOUNTER — Inpatient Hospital Stay (HOSPITAL_COMMUNITY)
Admission: EM | Admit: 2016-01-10 | Discharge: 2016-01-21 | DRG: 296 | Disposition: E | Payer: Medicare Other | Attending: Pulmonary Disease | Admitting: Pulmonary Disease

## 2016-01-10 DIAGNOSIS — E876 Hypokalemia: Secondary | ICD-10-CM | POA: Diagnosis present

## 2016-01-10 DIAGNOSIS — Z7902 Long term (current) use of antithrombotics/antiplatelets: Secondary | ICD-10-CM | POA: Diagnosis not present

## 2016-01-10 DIAGNOSIS — Z7952 Long term (current) use of systemic steroids: Secondary | ICD-10-CM | POA: Diagnosis not present

## 2016-01-10 DIAGNOSIS — Z8249 Family history of ischemic heart disease and other diseases of the circulatory system: Secondary | ICD-10-CM | POA: Diagnosis not present

## 2016-01-10 DIAGNOSIS — Z23 Encounter for immunization: Secondary | ICD-10-CM | POA: Diagnosis not present

## 2016-01-10 DIAGNOSIS — J9620 Acute and chronic respiratory failure, unspecified whether with hypoxia or hypercapnia: Secondary | ICD-10-CM | POA: Diagnosis present

## 2016-01-10 DIAGNOSIS — D696 Thrombocytopenia, unspecified: Secondary | ICD-10-CM | POA: Diagnosis present

## 2016-01-10 DIAGNOSIS — I252 Old myocardial infarction: Secondary | ICD-10-CM

## 2016-01-10 DIAGNOSIS — J45909 Unspecified asthma, uncomplicated: Secondary | ICD-10-CM | POA: Diagnosis present

## 2016-01-10 DIAGNOSIS — K219 Gastro-esophageal reflux disease without esophagitis: Secondary | ICD-10-CM | POA: Diagnosis present

## 2016-01-10 DIAGNOSIS — I469 Cardiac arrest, cause unspecified: Principal | ICD-10-CM | POA: Diagnosis present

## 2016-01-10 DIAGNOSIS — Z66 Do not resuscitate: Secondary | ICD-10-CM | POA: Diagnosis present

## 2016-01-10 DIAGNOSIS — Z833 Family history of diabetes mellitus: Secondary | ICD-10-CM

## 2016-01-10 DIAGNOSIS — M810 Age-related osteoporosis without current pathological fracture: Secondary | ICD-10-CM | POA: Diagnosis present

## 2016-01-10 DIAGNOSIS — E232 Diabetes insipidus: Secondary | ICD-10-CM | POA: Diagnosis not present

## 2016-01-10 DIAGNOSIS — Z825 Family history of asthma and other chronic lower respiratory diseases: Secondary | ICD-10-CM

## 2016-01-10 DIAGNOSIS — I10 Essential (primary) hypertension: Secondary | ICD-10-CM | POA: Diagnosis present

## 2016-01-10 DIAGNOSIS — J449 Chronic obstructive pulmonary disease, unspecified: Secondary | ICD-10-CM | POA: Diagnosis present

## 2016-01-10 DIAGNOSIS — Z7901 Long term (current) use of anticoagulants: Secondary | ICD-10-CM | POA: Diagnosis not present

## 2016-01-10 DIAGNOSIS — E870 Hyperosmolality and hypernatremia: Secondary | ICD-10-CM | POA: Diagnosis not present

## 2016-01-10 DIAGNOSIS — Z8673 Personal history of transient ischemic attack (TIA), and cerebral infarction without residual deficits: Secondary | ICD-10-CM

## 2016-01-10 DIAGNOSIS — Z7982 Long term (current) use of aspirin: Secondary | ICD-10-CM | POA: Diagnosis not present

## 2016-01-10 DIAGNOSIS — Z79899 Other long term (current) drug therapy: Secondary | ICD-10-CM

## 2016-01-10 DIAGNOSIS — Z853 Personal history of malignant neoplasm of breast: Secondary | ICD-10-CM | POA: Diagnosis not present

## 2016-01-10 DIAGNOSIS — Z515 Encounter for palliative care: Secondary | ICD-10-CM | POA: Diagnosis present

## 2016-01-10 DIAGNOSIS — J9600 Acute respiratory failure, unspecified whether with hypoxia or hypercapnia: Secondary | ICD-10-CM

## 2016-01-10 DIAGNOSIS — G931 Anoxic brain damage, not elsewhere classified: Secondary | ICD-10-CM | POA: Diagnosis present

## 2016-01-10 DIAGNOSIS — E872 Acidosis: Secondary | ICD-10-CM | POA: Diagnosis present

## 2016-01-10 DIAGNOSIS — G9341 Metabolic encephalopathy: Secondary | ICD-10-CM | POA: Diagnosis present

## 2016-01-10 DIAGNOSIS — E039 Hypothyroidism, unspecified: Secondary | ICD-10-CM | POA: Diagnosis present

## 2016-01-10 DIAGNOSIS — G809 Cerebral palsy, unspecified: Secondary | ICD-10-CM | POA: Diagnosis present

## 2016-01-10 DIAGNOSIS — M1990 Unspecified osteoarthritis, unspecified site: Secondary | ICD-10-CM | POA: Diagnosis present

## 2016-01-10 LAB — URINALYSIS, ROUTINE W REFLEX MICROSCOPIC
BILIRUBIN URINE: NEGATIVE
Glucose, UA: 250 mg/dL — AB
Ketones, ur: NEGATIVE mg/dL
Nitrite: NEGATIVE
PH: 6.5 (ref 5.0–8.0)
Protein, ur: >300 mg/dL — AB

## 2016-01-10 LAB — BLOOD GAS, ARTERIAL
ACID-BASE EXCESS: 10.1 mmol/L — AB (ref 0.0–2.0)
BICARBONATE: 33.6 meq/L — AB (ref 20.0–24.0)
Drawn by: 221791
Drawn by: 277331
FIO2: 100
FIO2: 0.5
MECHVT: 480 mL
MECHVT: 480 mL
O2 SAT: 94 %
PATIENT TEMPERATURE: 37
PATIENT TEMPERATURE: 37
PCO2 ART: 41.5 mmHg (ref 35.0–45.0)
PEEP/CPAP: 12 cmH2O
PEEP/CPAP: 5 cmH2O
PH ART: 7.52 — AB (ref 7.350–7.450)
PO2 ART: 484 mmHg — AB (ref 80.0–100.0)
PO2 ART: 60.6 mmHg — AB (ref 80.0–100.0)
RATE: 22 resp/min
RATE: 22 resp/min
pCO2 arterial: 46.4 mmHg — ABNORMAL HIGH (ref 35.0–45.0)
pH, Arterial: 7.444 (ref 7.350–7.450)

## 2016-01-10 LAB — I-STAT TROPONIN, ED: Troponin i, poc: 0.03 ng/mL (ref 0.00–0.08)

## 2016-01-10 LAB — GLUCOSE, CAPILLARY
GLUCOSE-CAPILLARY: 175 mg/dL — AB (ref 65–99)
Glucose-Capillary: 71 mg/dL (ref 65–99)
Glucose-Capillary: 153 mg/dL — ABNORMAL HIGH (ref 65–99)
Glucose-Capillary: 263 mg/dL — ABNORMAL HIGH (ref 65–99)

## 2016-01-10 LAB — URINE MICROSCOPIC-ADD ON

## 2016-01-10 LAB — CBC WITH DIFFERENTIAL/PLATELET
Basophils Absolute: 0 10*3/uL (ref 0.0–0.1)
Basophils Relative: 0 %
Eosinophils Absolute: 0.6 10*3/uL (ref 0.0–0.7)
Eosinophils Relative: 5 %
HEMATOCRIT: 41.3 % (ref 36.0–46.0)
HEMOGLOBIN: 11.5 g/dL — AB (ref 12.0–15.0)
LYMPHS ABS: 3.1 10*3/uL (ref 0.7–4.0)
LYMPHS PCT: 28 %
MCH: 29.2 pg (ref 26.0–34.0)
MCHC: 27.8 g/dL — AB (ref 30.0–36.0)
MCV: 104.8 fL — AB (ref 78.0–100.0)
MONOS PCT: 5 %
Monocytes Absolute: 0.5 10*3/uL (ref 0.1–1.0)
NEUTROS ABS: 6.9 10*3/uL (ref 1.7–7.7)
NEUTROS PCT: 62 %
Platelets: 123 10*3/uL — ABNORMAL LOW (ref 150–400)
RBC: 3.94 MIL/uL (ref 3.87–5.11)
RDW: 14.6 % (ref 11.5–15.5)
WBC: 11.1 10*3/uL — ABNORMAL HIGH (ref 4.0–10.5)

## 2016-01-10 LAB — COMPREHENSIVE METABOLIC PANEL
ANION GAP: 25 — AB (ref 5–15)
AST: <5 U/L — ABNORMAL LOW (ref 15–41)
Albumin: 2.9 g/dL — ABNORMAL LOW (ref 3.5–5.0)
Alkaline Phosphatase: 63 U/L (ref 38–126)
BUN: 12 mg/dL (ref 6–20)
CHLORIDE: 90 mmol/L — AB (ref 101–111)
CO2: 29 mmol/L (ref 22–32)
CREATININE: 0.87 mg/dL (ref 0.44–1.00)
Calcium: 9 mg/dL (ref 8.9–10.3)
GFR calc non Af Amer: >60 mL/min (ref >60)
Glucose, Bld: 365 mg/dL — ABNORMAL HIGH (ref 65–99)
POTASSIUM: 5.1 mmol/L (ref 3.5–5.1)
SODIUM: 144 mmol/L (ref 135–145)
Total Bilirubin: 0.8 mg/dL (ref 0.3–1.2)
Total Protein: 5.2 g/dL — ABNORMAL LOW (ref 6.5–8.1)

## 2016-01-10 LAB — I-STAT CHEM 8, ED
BUN: 10 mg/dL (ref 6–20)
CALCIUM ION: 1.07 mmol/L — AB (ref 1.13–1.30)
CHLORIDE: 88 mmol/L — AB (ref 101–111)
CREATININE: 0.9 mg/dL (ref 0.44–1.00)
Glucose, Bld: 352 mg/dL — ABNORMAL HIGH (ref 65–99)
HCT: 38 % (ref 36.0–46.0)
Hemoglobin: 12.9 g/dL (ref 12.0–15.0)
Potassium: 4.9 mmol/L (ref 3.5–5.1)
Sodium: 139 mmol/L (ref 135–145)
TCO2: 32 mmol/L (ref 0–100)

## 2016-01-10 LAB — MRSA PCR SCREENING: MRSA BY PCR: POSITIVE — AB

## 2016-01-10 LAB — TROPONIN I
TROPONIN I: 0.13 ng/mL — AB (ref <0.031)
Troponin I: 0.12 ng/mL — ABNORMAL HIGH (ref <0.031)
Troponin I: 0.06 ng/mL — ABNORMAL HIGH (ref <0.031)

## 2016-01-10 LAB — TSH: TSH: 1.489 u[IU]/mL (ref 0.350–4.500)

## 2016-01-10 LAB — I-STAT CG4 LACTIC ACID, ED: Lactic Acid, Venous: 16.51 mmol/L (ref 0.5–2.0)

## 2016-01-10 LAB — LACTIC ACID, PLASMA: Lactic Acid, Venous: 2.7 mmol/L (ref 0.5–2.0)

## 2016-01-10 MED ORDER — ACETAMINOPHEN 500 MG PO TABS: 1000.0000 mg | ORAL_TABLET | Freq: Four times a day (QID) | ORAL | Status: DC | PRN

## 2016-01-10 MED ORDER — EPINEPHRINE HCL 0.1 MG/ML IJ SOSY
PREFILLED_SYRINGE | INTRAMUSCULAR | Status: AC
  Filled 2016-01-10: qty 40

## 2016-01-10 MED ORDER — PIPERACILLIN-TAZOBACTAM 3.375 G IVPB
3.3750 g | Freq: Three times a day (TID) | INTRAVENOUS | Status: DC
  Administered 2016-01-10 – 2016-01-12 (×6): 3.375 g via INTRAVENOUS
  Filled 2016-01-10 (×3): qty 50

## 2016-01-10 MED ORDER — HYDROCORTISONE NA SUCCINATE PF 100 MG IJ SOLR
50.0000 mg | Freq: Three times a day (TID) | INTRAMUSCULAR | Status: AC
  Administered 2016-01-10 – 2016-01-11 (×3): 50 mg via INTRAVENOUS
  Filled 2016-01-10 (×3): qty 2

## 2016-01-10 MED ORDER — PNEUMOCOCCAL VAC POLYVALENT 25 MCG/0.5ML IJ INJ
0.5000 mL | INJECTION | INTRAMUSCULAR | Status: AC
  Administered 2016-01-11: 0.5 mL via INTRAMUSCULAR
  Filled 2016-01-10: qty 0.5

## 2016-01-10 MED ORDER — ALBUTEROL SULFATE (2.5 MG/3ML) 0.083% IN NEBU
2.5000 mg | INHALATION_SOLUTION | Freq: Four times a day (QID) | RESPIRATORY_TRACT | Status: DC
  Administered 2016-01-10 – 2016-01-12 (×8): 2.5 mg via RESPIRATORY_TRACT
  Filled 2016-01-10 (×9): qty 3

## 2016-01-10 MED ORDER — SODIUM CHLORIDE 0.9 % IJ SOLN
3.0000 mL | Freq: Two times a day (BID) | INTRAMUSCULAR | Status: DC
  Administered 2016-01-10 – 2016-01-11 (×3): 3 mL via INTRAVENOUS

## 2016-01-10 MED ORDER — INFLUENZA VAC SPLIT QUAD 0.5 ML IM SUSY
0.5000 mL | PREFILLED_SYRINGE | INTRAMUSCULAR | Status: AC
  Administered 2016-01-11: 0.5 mL via INTRAMUSCULAR
  Filled 2016-01-10: qty 0.5

## 2016-01-10 MED ORDER — ONDANSETRON HCL 4 MG PO TABS: 4.0000 mg | ORAL_TABLET | Freq: Four times a day (QID) | ORAL | Status: DC | PRN

## 2016-01-10 MED ORDER — SODIUM CHLORIDE 0.9 % IV BOLUS (SEPSIS)
1000.0000 mL | Freq: Once | INTRAVENOUS | Status: AC
  Administered 2016-01-10: 1000 mL via INTRAVENOUS

## 2016-01-10 MED ORDER — METHYLPREDNISOLONE SODIUM SUCC 40 MG IJ SOLR
40.0000 mg | Freq: Four times a day (QID) | INTRAMUSCULAR | Status: DC
  Administered 2016-01-10 – 2016-01-12 (×8): 40 mg via INTRAVENOUS
  Filled 2016-01-10 (×8): qty 1

## 2016-01-10 MED ORDER — CHLORHEXIDINE GLUCONATE CLOTH 2 % EX PADS
6.0000 | MEDICATED_PAD | Freq: Every day | CUTANEOUS | Status: DC
  Administered 2016-01-11: 6 via TOPICAL

## 2016-01-10 MED ORDER — ONDANSETRON HCL 4 MG/2ML IJ SOLN: 4.0000 mg | Freq: Four times a day (QID) | INTRAMUSCULAR | Status: DC | PRN

## 2016-01-10 MED ORDER — SODIUM BICARBONATE 8.4 % IV SOLN
INTRAVENOUS | Status: AC | PRN
  Administered 2016-01-10: 50 meq via INTRAVENOUS

## 2016-01-10 MED ORDER — VANCOMYCIN HCL 10 G IV SOLR
1500.0000 mg | Freq: Once | INTRAVENOUS | Status: AC
  Administered 2016-01-10: 1500 mg via INTRAVENOUS
  Filled 2016-01-10: qty 1500

## 2016-01-10 MED ORDER — MUPIROCIN 2 % EX OINT
1.0000 "application " | TOPICAL_OINTMENT | Freq: Two times a day (BID) | CUTANEOUS | Status: DC
  Administered 2016-01-10 – 2016-01-11 (×3): 1 via NASAL
  Filled 2016-01-10: qty 22

## 2016-01-10 MED ORDER — SODIUM CHLORIDE 0.9 % IV BOLUS (SEPSIS)
2000.0000 mL | Freq: Once | INTRAVENOUS | Status: AC
  Administered 2016-01-10: 2000 mL via INTRAVENOUS

## 2016-01-10 MED ORDER — DOPAMINE-DEXTROSE 3.2-5 MG/ML-% IV SOLN: 0.0000 ug/kg/min | INTRAVENOUS | Status: DC

## 2016-01-10 MED ORDER — ENOXAPARIN SODIUM 40 MG/0.4ML ~~LOC~~ SOLN
40.0000 mg | SUBCUTANEOUS | Status: DC
  Administered 2016-01-10 – 2016-01-11 (×2): 40 mg via SUBCUTANEOUS
  Filled 2016-01-10 (×2): qty 0.4

## 2016-01-10 MED ORDER — INSULIN ASPART 100 UNIT/ML ~~LOC~~ SOLN
0.0000 [IU] | SUBCUTANEOUS | Status: DC
  Administered 2016-01-10 (×2): 4 [IU] via SUBCUTANEOUS
  Administered 2016-01-10: 11 [IU] via SUBCUTANEOUS
  Administered 2016-01-11: 4 [IU] via SUBCUTANEOUS
  Administered 2016-01-11 (×2): 7 [IU] via SUBCUTANEOUS
  Administered 2016-01-11: 4 [IU] via SUBCUTANEOUS
  Administered 2016-01-11: 7 [IU] via SUBCUTANEOUS
  Administered 2016-01-11 – 2016-01-12 (×2): 4 [IU] via SUBCUTANEOUS

## 2016-01-10 MED ORDER — EPINEPHRINE HCL 0.1 MG/ML IJ SOSY
PREFILLED_SYRINGE | INTRAMUSCULAR | Status: AC | PRN
  Administered 2016-01-10 (×8): 1 mg via INTRAVENOUS

## 2016-01-10 MED ORDER — VANCOMYCIN HCL IN DEXTROSE 750-5 MG/150ML-% IV SOLN
750.0000 mg | Freq: Two times a day (BID) | INTRAVENOUS | Status: DC
  Administered 2016-01-10: 750 mg via INTRAVENOUS
  Filled 2016-01-10 (×4): qty 150

## 2016-01-10 MED ORDER — LEVOTHYROXINE SODIUM 100 MCG IV SOLR
50.0000 ug | Freq: Every day | INTRAVENOUS | Status: DC
  Administered 2016-01-10 – 2016-01-11 (×2): 50 ug via INTRAVENOUS
  Filled 2016-01-10 (×3): qty 5

## 2016-01-10 MED ORDER — CHLORHEXIDINE GLUCONATE 0.12% ORAL RINSE (MEDLINE KIT)
15.0000 mL | Freq: Two times a day (BID) | OROMUCOSAL | Status: DC
  Administered 2016-01-10 – 2016-01-12 (×4): 15 mL via OROMUCOSAL

## 2016-01-10 MED ORDER — PANTOPRAZOLE SODIUM 40 MG IV SOLR
40.0000 mg | Freq: Two times a day (BID) | INTRAVENOUS | Status: DC
  Administered 2016-01-10 – 2016-01-11 (×4): 40 mg via INTRAVENOUS
  Filled 2016-01-10 (×4): qty 40

## 2016-01-10 MED ORDER — SODIUM CHLORIDE 0.9 % IV SOLN
INTRAVENOUS | Status: DC
  Administered 2016-01-10: 08:00:00 via INTRAVENOUS

## 2016-01-10 MED ORDER — ATROPINE SULFATE 1 MG/ML IJ SOLN
INTRAMUSCULAR | Status: AC | PRN
  Administered 2016-01-10 (×2): 1 mg via INTRAVENOUS

## 2016-01-10 MED ORDER — ANTISEPTIC ORAL RINSE SOLUTION (CORINZ)
7.0000 mL | OROMUCOSAL | Status: DC
  Administered 2016-01-10 – 2016-01-12 (×17): 7 mL via OROMUCOSAL

## 2016-01-10 MED ORDER — DEXTROSE-NACL 5-0.9 % IV SOLN
INTRAVENOUS | Status: DC
  Administered 2016-01-10 – 2016-01-12 (×4): via INTRAVENOUS

## 2016-01-10 NOTE — Progress Notes (Signed)
ANTIBIOTIC CONSULT NOTE - INITIAL  Pharmacy Consult for Vancomycin & Zosyn Indication: pneumonia  Allergies  Allergen Reactions  . Latex     Provided via MAR  . Naproxen Other (See Comments)    Unknown-Provided via MAR    Patient Measurements: Weight: 198 lb (89.812 kg) Adjusted Body Weight:   Vital Signs: BP: 74/44 mmHg (01/21 0930) Pulse Rate: 107 (01/21 0930) Intake/Output from previous day:   Intake/Output from this shift:    Labs:  Recent Labs  01/13/2016 0706 12/25/2015 0723  WBC 11.1*  --   HGB 11.5* 12.9  PLT 123*  --   CREATININE 0.87 0.90   CrCl cannot be calculated (Unknown ideal weight.). No results for input(s): VANCOTROUGH, VANCOPEAK, VANCORANDOM, GENTTROUGH, GENTPEAK, GENTRANDOM, TOBRATROUGH, TOBRAPEAK, TOBRARND, AMIKACINPEAK, AMIKACINTROU, AMIKACIN in the last 72 hours.   Microbiology: No results found for this or any previous visit (from the past 720 hour(s)).  Medical History: Past Medical History  Diagnosis Date  . Cerebral palsy ([place])   . Asthma   . Seasonal allergies   . Thyroid disease   . HTN (hypertension)   . Anxiety   . Arthritis   . Anginal pain ([place])   . Osteoporosis 05/08/2012  . Osteoporosis 05/08/2012  . Stroke ([place])   . Angina at rest [facility])   . Chronic steroid use   . Pneumonia 09/2013  . Dysphagia   . Hypopotassemia   . Respiratory failure ([place])   . Generalized weakness   . Urinary retention   . Dysphagia   . Acute MI ([place])   . Hypopotassemia   . Sepsis ([place])   . Neurogenic bladder   . Reflux   . Obesity   . Staph infection January 2016  . Clostridium difficile infection January 2016  . Acute respiratory failure ([place])   . Breast cancer ([place])     Right breast, infiltrating ductal.  . Breast cancer, right breast ([place])   . Breast cancer ([place])   . Breast cancer, right breast ([place])   . Breast CA ([facility])     Medications:  Scheduled:  . albuterol  2.5 mg Nebulization Q6H  . enoxaparin (LOVENOX) injection  40 mg  Subcutaneous Q24H  . EPINEPHrine      . hydrocortisone sod succinate (SOLU-CORTEF) inj  50 mg Intravenous Q8H  . insulin aspart  0-20 Units Subcutaneous 6 times per day  . levothyroxine  50 mcg Intravenous Daily  . methylPREDNISolone (SOLU-MEDROL) injection  40 mg Intravenous Q6H  . pantoprazole (PROTONIX) IV  40 mg Intravenous Q12H  . piperacillin-tazobactam (ZOSYN)  IV  3.375 g Intravenous Q8H  . sodium chloride  3 mL Intravenous Q12H  . vancomycin  1,500 mg Intravenous Once  . [START ON 01/11/2016] vancomycin  750 mg Intravenous Q12H   Assessment: 75 yo female admitted from ED. with hx of cerebral palsy, hypothyroidism, HTN, prior CVA, chronic steroid use, hx of breast cancer, non ambulatory, from SNF facility, was "fine yesterday" per her sister, found unresponsive and staff started CPR as she found in full cardiac arrest for unclear duration. Prolonged ACLS protocol. Intubated, sedated. Non invasive care including pressor and antibiotics. Likely to focus on comfort care if no significant improvement Calculated CrCl 45 ml/min   Goal of Therapy:  Vancomycin trough level 15-20 mcg/ml  Plan:  Vancomycin 1500 mg IV loading dose, then 750 mg IV every 12 hours Zosyn 3.375 GM IV every 8 hours, infuse each dose over 4 hours Vancomycin trough at steady state Labs per  protocol  Abner Greenspan, Shuna Tabor Bennett 01/13/2016,11:00 AM

## 2016-01-10 NOTE — Code Documentation (Signed)
Faint pulse detected with doppler post epi

## 2016-01-10 NOTE — Progress Notes (Signed)
CRITICAL VALUE ALERT  Critical value received: mrsa positive  Date of notification: 01/09/2016  Time of notification: 4:44 PM   Critical value read back:Yes.    Nurse who received alert:  l schonewitz, rn   MD notified (1st page):  Order set entered per protocol  Time of first page:  4:44 PM  MD notified (2nd page):  Time of second page:  Responding MD:  Followed protocol  Time MD responded:  4:45 PM

## 2016-01-10 NOTE — Code Documentation (Signed)
Inadequate airway, airway removed and bag mask ventilation initiated

## 2016-01-10 NOTE — Code Documentation (Signed)
Family updated as to patient's status by Dr Raina Mina.  PT sister ([name]) and son Sarajane Marek) agrees to discontinue CPR if pulses stop again via phone.  Confirmed by Charlies Silvers RN

## 2016-01-10 NOTE — ED Provider Notes (Signed)
CSN: [id number]     Arrival date & time 01/19/2016  0606 History   First MD Initiated Contact with Patient 01/17/2016 [phone]     Chief Complaint  Patient presents with  . Cardiac Arrest      The history is provided by the nursing home and the EMS personnel. The history is limited by the condition of the patient (CPR in progress).  Pt was seen on arrival to ED exam room at 0605.  Per EMS and NH report: Pt found by NH staff "unresponsive," and CPR was started shortly before call to EMS at 0535. EMS arrived on scene [phone] with CPR in progress.  Pulses returned [id number]. While EMS was en route to [facility], pt lost pulses, approximately [id number]. ETT was placed by EMS and they re-routed to [facility] ED. Pt arrived with CPR in progress.   Past Medical History  Diagnosis Date  . Cerebral palsy ([place])   . Asthma   . Seasonal allergies   . Thyroid disease   . HTN (hypertension)   . Anxiety   . Arthritis   . Anginal pain ([place])   . Osteoporosis 05/08/2012  . Osteoporosis 05/08/2012  . Stroke ([place])   . Angina at rest [facility])   . Chronic steroid use   . Pneumonia 09/2013  . Dysphagia   . Hypopotassemia   . Respiratory failure ([place])   . Generalized weakness   . Urinary retention   . Dysphagia   . Acute MI ([place])   . Hypopotassemia   . Sepsis ([place])   . Neurogenic bladder   . Reflux   . Obesity   . Staph infection January 2016  . Clostridium difficile infection January 2016  . Acute respiratory failure ([place])   . Breast cancer ([place])     Right breast, infiltrating ductal.  . Breast cancer, right breast ([place])   . Breast cancer ([place])   . Breast cancer, right breast ([place])   . Breast CA [facility])    Past Surgical History  Procedure Laterality Date  . Breast lumpectomy    . Radical abdominal hysterectomy    . Dental extraction    . Right hand surgery     Family History  Problem Relation Age of Onset  . Cancer    . Diabetes    . Arthritis    . Asthma    . Heart attack Father   . Heart attack Mother     Social History  Substance Use Topics  . Smoking status: Never Smoker   . Smokeless tobacco: Never Used  . Alcohol Use: No    Review of Systems  Unable to perform ROS: Acuity of condition    Allergies  Latex and Naproxen  Home Medications   Prior to Admission medications   Medication Sig Start Date End Date Taking? Authorizing Provider  acetaminophen (TYLENOL) 500 MG tablet Take 1,000 mg by mouth every 6 (six) hours as needed for mild pain or moderate pain.    Historical Provider, MD  albuterol (ACCUNEB) 0.63 MG/3ML nebulizer solution Take 1 ampule by nebulization every 6 (six) hours.    Historical Provider, MD  albuterol (PROVENTIL HFA;VENTOLIN HFA) 108 (90 BASE) MCG/ACT inhaler Inhale 2 puffs into the lungs every 4 (four) hours as needed. Shortness of breath 06/13/13   Sinda Du, MD  ALPRAZolam [name]) 0.5 MG tablet Take 1 tablet (0.5 mg total) by mouth 3 (three) times daily as needed for anxiety. Patient taking differently: Take 0.25 mg by mouth every 6 (six)  hours.  02/14/15   Sinda Du, MD  aspirin EC 81 MG tablet Take 81 mg by mouth daily.    Historical Provider, MD  atorvastatin (LIPITOR) 40 MG tablet Take 40 mg by mouth daily.    Historical Provider, MD  baclofen (LIORESAL) 10 MG tablet Take 10 mg by mouth 3 (three) times daily.     Historical Provider, MD  calcium gluconate 500 MG tablet Take 1 tablet by mouth 2 (two) times daily.    Historical Provider, MD  ceFEPIme (MAXIPIME) 1 GM/50ML SOLN Inject 1 g into the vein every 8 (eight) hours. 7 day course starting on 12/23/15 12/23/15   Historical Provider, MD  cefTRIAXone (ROCEPHIN) 1 g injection Inject 2 g into the muscle once.    Historical Provider, MD  cholecalciferol (VITAMIN D) 1000 UNITS tablet Take 1,000 Units by mouth daily.    Historical Provider, MD  clopidogrel (PLAVIX) 75 MG tablet Take 1 tablet (75 mg total) by mouth daily with breakfast. 01/04/14   Sinda Du, MD  Cranberry 450 MG CAPS Take 1 capsule by  mouth daily.    Historical Provider, MD  feeding supplement, GLUCERNA SHAKE, (GLUCERNA SHAKE) LIQD Take 237 mLs by mouth 2 (two) times daily between meals. 01/04/15   Sinda Du, MD  furosemide (LASIX) 20 MG tablet Take 2 tablets (40 mg total) by mouth daily. 01/04/15   Sinda Du, MD  gabapentin (NEURONTIN) 100 MG capsule Take 100 mg by mouth 2 (two) times daily.    Historical Provider, MD  guaiFENesin (MUCINEX) 600 MG 12 hr tablet Take 600 mg by mouth 2 (two) times daily.    Historical Provider, MD  haloperidol (HALDOL) 0.5 MG tablet Take 0.5 mg by mouth daily.     Historical Provider, MD  insulin aspart (NOVOLOG) 100 UNIT/ML injection Inject 0-20 Units into the skin 3 (three) times daily with meals. Patient taking differently: Inject 2-10 Units into the skin 3 (three) times daily with meals. Per sliding scale 140-200= 2 units  201-250= 4 units 251-300= 6 units 301-350= 8 units 351-400=10units 01/04/15   Sinda Du, MD  levofloxacin (LEVAQUIN) 750 MG/150ML SOLN Inject 750 mg into the vein daily. 7 day course starting on 12/23/15    Historical Provider, MD  levothyroxine (SYNTHROID, LEVOTHROID) 100 MCG tablet Take 100 mcg by mouth daily before breakfast.    Historical Provider, MD  loratadine (CLARITIN) 10 MG tablet Take 10 mg by mouth daily.    Historical Provider, MD  metoprolol tartrate (LOPRESSOR) 25 MG tablet Take 0.5 tablets (12.5 mg total) by mouth 2 (two) times daily. 02/05/15   Herminio Commons, MD  nitroGLYCERIN (NITROSTAT) 0.4 MG SL tablet Place 1 tablet (0.4 mg total) under the tongue every 5 (five) minutes as needed for chest pain. 06/13/13   Sinda Du, MD  nystatin cream (MYCOSTATIN) Apply 1 application topically 3 (three) times daily. Applied to inner thighs and groin area at every shift for skin irritation    Historical Provider, MD  pantoprazole (PROTONIX) 40 MG tablet Take 1 tablet (40 mg total) by mouth daily. 06/13/13   Sinda Du, MD  polyethylene glycol  [facility] Floria Raveling) packet Take 17 g by mouth daily.    Historical Provider, MD  potassium chloride SA (K-DUR,KLOR-CON) 20 MEQ tablet Take 2 tablets (40 mEq total) by mouth 2 (two) times daily. 01/04/15   Sinda Du, MD  ranolazine (RANEXA) 500 MG 12 hr tablet Take 1 tablet (500 mg total) by mouth 2 (two) times daily. 11/28/14  Herminio Commons, MD  saccharomyces boulardii (FLORASTOR) 250 MG capsule Take 250 mg by mouth 2 (two) times daily.    Historical Provider, MD  sertraline (ZOLOFT) 100 MG tablet Take 1 tablet (100 mg total) by mouth daily. 08/13/13   Sinda Du, MD  temazepam (RESTORIL) 7.5 MG capsule Take 7.5 mg by mouth at bedtime as needed for sleep.    Historical Provider, MD  traMADol (ULTRAM) 50 MG tablet Take 50 mg by mouth every 6 (six) hours as needed for moderate pain.    Historical Provider, MD  vancomycin 1,000 mg in sodium chloride 0.9 % 250 mL Inject 1,000 mg into the vein daily. 1g IV every 12 hours for 7 days starting on 12/23/15    Historical Provider, MD  vitamin B-12 (CYANOCOBALAMIN) 1000 MCG tablet Take 1,000 mcg by mouth daily.    Historical Provider, MD   BP 83/66 mmHg  Pulse 117  Resp 22  Wt 198 lb (89.812 kg)  SpO2 100%   Patient Vitals for the past 24 hrs:  BP Pulse Resp SpO2 Weight  12/26/2015 0830 (!) 81/36 mmHg 110 22 100 % -  01/08/2016 0800 (!) 83/66 mmHg 117 22 100 % -  12/21/2015 0735 (!) 77/61 mmHg 120 26 100 % -  01/17/2016 0700 (!) 78/43 mmHg 112 22 98 % -  12/23/2015 0654 (!) 176/110 mmHg 119 22 98 % -  01/17/2016 0649 176/100 mmHg (!) 130 19 (!) 79 % -  12/29/2015 0637 (!) 176/110 mmHg - - - -  12/23/2015 0636 - 101 - - -  12/29/2015 0616 - (!) 0 - - -  12/23/2015 0605 - (!) 0 (!) 0 - 198 lb (89.812 kg)    Physical Exam  [id number]: Physical examination: Vital signs and O2 SAT: Reviewed; Constitutional: Well developed, Well nourished, Well hydrated, Unresponsive; Head and Face: Normocephalic, Atraumatic; Eyes: Pupils 73mm bilat, NR.; ENMT: Intubated, Mucous  membranes moist; Neck: No lymphadenopathy, No crepitus, Trachea midline; Cardiovascular: Heart sounds absent, Pulses absent; Respiratory: Breath sounds clear & equal bilaterally, Apnea, Bag-valve-tube ventilated; Chest: No deformity, Movement normal, No crepitus; Abdomen: Soft, Nondistended; Extremities: No deformity, No edema; Neuro: Unresponsive, GCS 3.; Skin: Color cyanotic, Dry, Cool   ED Course  Procedures (including critical care time)  Cardiopulmonary Resuscitation (CPR) Procedure Note Directed/Performed by: Alfonzo Feller I personally directed ancillary staff and/or performed CPR in an effort to regain return of spontaneous circulation and to maintain cardiac, neuro and systemic perfusion.    Study:  Limited Ultrasound of the heart and pericardium.    Multiple views of the heart and pericardium are obtained with a multi-frequency probe.  Indications: Cardiac arrest;  Performed and interpreted at the bedside by Myself;  Study limited by: Body habitus, Emergent procedure; Views used: Parasternal long and short axis; Findings include: visualized cardiac activity with coordinated contractions;  Interpretation: Cardiac activity present.   Airway procedure:  Timeout: Pre-procedure timeout not performed due to emergent nature of procedure; Indication: Unresponsive;  Oxygen Saturation: 100 %; Oxygen concentration: 100 %; Preoxygenation: Bag-valve-mask;  Medication: none; Procedure: Suctioning (no gag), Glidescope laryngoscopy, Endotracheal intubation with 7.67mm cuffed endotracheal tube, Bag-valve-tube ventilation, Mechanical ventilation;  Reassessment: Successful intubation, No bleeding or obvious trauma in oral cavity or posterior pharynx. Teeth intact, without obvious trauma. Breath sounds equal bilaterally, No breath sounds heard over stomach, Chest movement symmetrical, CO2 detector color change, Endotracheal tube fogging, Oxygen saturation normal. Post-procedure xray  obtained.     Labs Review Imaging Review  I have personally  reviewed and evaluated these images and lab results as part of my medical decision-making.   EKG Interpretation   Date/Time:  Saturday January 10 2016 07:19:56 EST Ventricular Rate:  126 PR Interval:  135 QRS Duration: 95 QT Interval:  321 QTC Calculation: 465 R Axis:   94 Text Interpretation:  Sinus tachycardia Right axis deviation When compared  with ECG of 02/09/2015 Rate faster Confirmed by [facility]  MD, Nunzio Cory  [phone]) on 12/28/2015 8:12:35 AM      MDM  MDM Reviewed: previous chart, nursing note and vitals Reviewed previous: labs and ECG Interpretation: labs, ECG and x-ray Total time providing critical care: 75-105 minutes. This excludes time spent performing separately reportable procedures and services. Consults: critical care and admitting MD     CRITICAL CARE Performed by: Alfonzo Feller Total critical care time: 90 minutes Critical care time was exclusive of separately billable procedures and treating other patients. Critical care was necessary to treat or prevent imminent or life-threatening deterioration. Critical care was time spent personally by me on the following activities: development of treatment plan with patient and/or surrogate as well as nursing, discussions with consultants, evaluation of patient's response to treatment, examination of patient, obtaining history from patient or surrogate, ordering and performing treatments and interventions, ordering and review of laboratory studies, ordering and review of radiographic studies, pulse oximetry and re-evaluation of patient's condition.  Results for orders placed or performed during the hospital encounter of 01/13/2016  Blood gas, arterial  Result Value Ref Range   FIO2 100.00    Delivery systems VENTILATOR    Mode PRESSURE REGULATED VOLUME CONTROL    VT 480 mL   LHR 22 resp/min   Peep/cpap 12.0 cm H20   pH, Arterial 7.444 7.350 -  7.450   pCO2 arterial 46.4 (H) 35.0 - 45.0 mmHg   pO2, Arterial 484 (H) 80.0 - 100.0 mmHg   O2 Saturation ABOVE REPORTABLE RANGE %   Patient temperature 37.0    Collection site LEFT RADIAL    Drawn by [id number]    Sample type ARTERIAL    Allens test (pass/fail) PASS PASS  CBC with Differential  Result Value Ref Range   WBC 11.1 (H) 4.0 - 10.5 K/uL   RBC 3.94 3.87 - 5.11 MIL/uL   Hemoglobin 11.5 (L) 12.0 - 15.0 g/dL   HCT 41.3 36.0 - 46.0 %   MCV 104.8 (H) 78.0 - 100.0 fL   MCH 29.2 26.0 - 34.0 pg   MCHC 27.8 (L) 30.0 - 36.0 g/dL   RDW 14.6 11.5 - 15.5 %   Platelets 123 (L) 150 - 400 K/uL   Neutrophils Relative % 62 %   Neutro Abs 6.9 1.7 - 7.7 K/uL   Lymphocytes Relative 28 %   Lymphs Abs 3.1 0.7 - 4.0 K/uL   Monocytes Relative 5 %   Monocytes Absolute 0.5 0.1 - 1.0 K/uL   Eosinophils Relative 5 %   Eosinophils Absolute 0.6 0.0 - 0.7 K/uL   Basophils Relative 0 %   Basophils Absolute 0.0 0.0 - 0.1 K/uL  Urinalysis, Routine w reflex microscopic  Result Value Ref Range   Color, Urine YELLOW YELLOW   APPearance CLOUDY (A) CLEAR   Specific Gravity, Urine >1.030 (H) 1.005 - 1.030   pH 6.5 5.0 - 8.0   Glucose, UA 250 (A) NEGATIVE mg/dL   Hgb urine dipstick LARGE (A) NEGATIVE   Bilirubin Urine NEGATIVE NEGATIVE   Ketones, ur NEGATIVE NEGATIVE mg/dL   Protein, ur >300 (A) NEGATIVE  mg/dL   Nitrite NEGATIVE NEGATIVE   Leukocytes, UA TRACE (A) NEGATIVE  Urine microscopic-add on  Result Value Ref Range   Squamous Epithelial / LPF 6-30 (A) NONE SEEN   WBC, UA 6-30 0 - 5 WBC/hpf   RBC / HPF TOO NUMEROUS TO COUNT 0 - 5 RBC/hpf   Bacteria, UA MANY (A) NONE SEEN  I-stat Chem 8, ED  Result Value Ref Range   Sodium 139 135 - 145 mmol/L   Potassium 4.9 3.5 - 5.1 mmol/L   Chloride 88 (L) 101 - 111 mmol/L   BUN 10 6 - 20 mg/dL   Creatinine, Ser 0.90 0.44 - 1.00 mg/dL   Glucose, Bld 352 (H) 65 - 99 mg/dL   Calcium, Ion 1.07 (L) 1.13 - 1.30 mmol/L   TCO2 32 0 - 100 mmol/L   Hemoglobin  12.9 12.0 - 15.0 g/dL   HCT 38.0 36.0 - 46.0 %  I-Stat CG4 Lactic Acid, ED  Result Value Ref Range   Lactic Acid, Venous 16.51 (HH) 0.5 - 2.0 mmol/L  I-stat troponin, ED  Result Value Ref Range   Troponin i, poc 0.03 0.00 - 0.08 ng/mL   Comment 3           Dg Chest Port 1 View 01/09/2016  CLINICAL DATA:  CPR in progress. Endotracheal tube placement. NG tube placement. EXAM: PORTABLE CHEST 1 VIEW COMPARISON:  Chest x-ray dated 12/23/2015. FINDINGS: Endotracheal tube tip is seen within the right mainstem bronchus. Enteric tube passes below the diaphragm. Mild cardiomegaly is unchanged. There is central pulmonary vascular congestion and mild bilateral interstitial edema, likely related to resuscitation efforts. No pleural effusions seen. No pneumothorax seen. Osseous structures about the chest are unremarkable. IMPRESSION: 1. Endotracheal tube tip within the right mainstem bronchus. Recommend retracting at least 5 cm for optimal radiographic appearance. 2. Mild cardiomegaly, stable. 3. Central pulmonary vascular congestion and mild bilateral interstitial edema suggesting mild volume overload/ CHF, possibly related to resuscitation efforts. These results were discussed by telephone at the time of interpretation on 01/06/2016 at 8:20 am to Dr. Francine Graven , who verbally acknowledged these results. Electronically Signed   By: Franki Cabot M.D.   On: 01/14/2016 08:22   Dg Chest Port 1 View 01/17/2016  CLINICAL DATA:  Status post adjustment of endotracheal tube EXAM: PORTABLE CHEST - 1 VIEW COMPARISON:  Film from earlier in the same day FINDINGS: Cardiac shadow is mildly enlarged but stable. An endotracheal tube is again seen and has been withdrawn now lying 4 cm above the carina. Nasogastric catheter is noted extending into the stomach. The lungs are clear with the exception of some mild right basilar atelectasis stable from the prior study. No bony abnormality is seen. Left-sided PICC line is noted in the  proximal SVC. IMPRESSION: Endotracheal tube in satisfactory position. Right basilar atelectasis stable from the prior exam. Electronically Signed   By: Inez Catalina M.D.   On: 01/11/2016 08:56    [id number]:  Pt arrived to ED with CPR in progress. Pt apneic, pulseless. CPR continued. Multiple doses of IV epi given, as well as IV bicarb, given unknown downtime before NH found pt.   0622:  Faint central pulses present with doppler, and coordinated cardiac activity noted on bedside US performed by myself. CPR held. IVF NS infusing. Monitor change from NSR to sinus bradycardia; IV atropine given. Pt's abd starting to become distended, tense. ETT removed and replaced by myself. NGT placed. Abd softening and less tense. Nasal trumpet  removed and NGT placed.  0630:  Monitor changed from NSR to sinus bradycardia. IV atropine given. Loss of pulses again. CPR restarted and multiple doses of IV epi given.   [id number]:  Central pulses now present. T/C to pt's sister ([name]): she was in the car with pt's son Sarajane Marek) en route to the hospital, both updated regarding pt's grave status, both agree DO NOT RESTART CPR if pt loses pulses again. ED RN then spoke on the phone and confirms their statements.   [id number]:  T/C to [facility] P[facility] Dr. Lynford Citizen, case discussed, including:  HPI, pertinent PM/SHx, VS/PE, dx testing, ED course and treatment:  Agrees pt can stay at [facility], no cooling.   0700:  Family here. I spoke with them with [facility] present. Both aware of pt's grave condition and likely lack of meaningful recovery due to unknown downtime before NH found pt, as well as 1+ hour of on/off CPR. Both verb understanding, and are still in agreement that CPR should NOT be restarted. Lactic acid significant elevated; IVF boluses continue.   0830:  Labs resulted. O2 setting on vent decreased. ETT pulled back and PCXR repeated (ETT position improved). T/C to [facility] Dr. Marin Comment, case discussed, including:  HPI, pertinent PM/SHx, VS/PE, dx  testing, ED course and treatment:  Agreeable to admit, requests to write temporary orders, obtain ICU bed to Dr. Luan Pulling' service.   Francine Graven, DO 01/13/16 1358

## 2016-01-10 NOTE — Progress Notes (Signed)
.  scu

## 2016-01-10 NOTE — Code Documentation (Signed)
Loss of pulses, CPR initiated.

## 2016-01-10 NOTE — ED Notes (Signed)
Patient found unresponsive and CPR was in progress when EMS arrived at [place]. ROSC per EMS. Patient without a pulse upon arrival to the ER.

## 2016-01-10 NOTE — H&P (Signed)
[name] History and Physical  [name] [id number] DOB: 1941-02-27    PCP:   Alonza Bogus, MD   Chief Complaint: Found in cardiac arrest for unclear amount of time.   HPI: [name] is an 75 y.o. female with hx of cerebral palsy, hypothyroidism, HTN, prior CVA, chronic steroid use, hx of breast cancer, non ambulatory, from SNF facility, was "fine yesterday" per her sister, found unresponsive and staff started CPR as she found in full cardiac arrest for unclear duration.  She subsequently went into a few more cardiac arrest per EDP, and was in prolonged ACLS protocol.  She subsequently has returned of her cardiac rhythm.  Hospitalist was asked to admit her for continuation of care.  She is intubated, sedated, and hypotensive with SBP of 60's. She has a sister and her only son at her bedside.  I spoke with family and expressed that her meaningful recovery is highly unlikely.  Family agreed that she will be a DNR.  With respect to [place], we plan to maintain her on the ventilator, give non invasive care including pressor and antibiotic, but likely will focus on comfort care only if she doesn't make significant improvement.   Rewiew of Systems: Unable.   Past Medical History  Diagnosis Date  . Cerebral palsy ([place])   . Asthma   . Seasonal allergies   . Thyroid disease   . HTN (hypertension)   . Anxiety   . Arthritis   . Anginal pain ([place])   . Osteoporosis 05/08/2012  . Osteoporosis 05/08/2012  . Stroke ([place])   . Angina at rest [facility])   . Chronic steroid use   . Pneumonia 09/2013  . Dysphagia   . Hypopotassemia   . Respiratory failure ([place])   . Generalized weakness   . Urinary retention   . Dysphagia   . Acute MI ([place])   . Hypopotassemia   . Sepsis ([place])   . Neurogenic bladder   . Reflux   . Obesity   . Staph infection January 2016  . Clostridium difficile infection January 2016  . Acute respiratory failure ([place])   . Breast cancer ([place])     Right breast,  infiltrating ductal.  . Breast cancer, right breast ([place])   . Breast cancer ([place])   . Breast cancer, right breast ([place])   . Breast CA [facility])     Past Surgical History  Procedure Laterality Date  . Breast lumpectomy    . Radical abdominal hysterectomy    . Dental extraction    . Right hand surgery      Medications:  HOME MEDS: Prior to Admission medications   Medication Sig Start Date End Date Taking? Authorizing Provider  atorvastatin (LIPITOR) 40 MG tablet Take 40 mg by mouth daily.   Yes Historical Provider, MD  acetaminophen (TYLENOL) 500 MG tablet Take 1,000 mg by mouth every 6 (six) hours as needed for mild pain or moderate pain.    Historical Provider, MD  albuterol (ACCUNEB) 0.63 MG/3ML nebulizer solution Take 1 ampule by nebulization every 6 (six) hours.    Historical Provider, MD  albuterol (PROVENTIL HFA;VENTOLIN HFA) 108 (90 BASE) MCG/ACT inhaler Inhale 2 puffs into the lungs every 4 (four) hours as needed. Shortness of breath 06/13/13   Sinda Du, MD  ALPRAZolam [name]) 0.5 MG tablet Take 1 tablet (0.5 mg total) by mouth 3 (three) times daily as needed for anxiety. Patient taking differently: Take 0.25 mg by mouth every 6 (six) hours.  02/14/15   Sinda Du, MD  aspirin EC 81 MG tablet Take 81 mg by mouth daily.    Historical Provider, MD  baclofen (LIORESAL) 10 MG tablet Take 10 mg by mouth 3 (three) times daily.     Historical Provider, MD  calcium gluconate 500 MG tablet Take 1 tablet by mouth 2 (two) times daily.    Historical Provider, MD  ceFEPIme (MAXIPIME) 1 GM/50ML SOLN Inject 1 g into the vein every 8 (eight) hours. 7 day course starting on 12/23/15 12/23/15   Historical Provider, MD  cefTRIAXone (ROCEPHIN) 1 g injection Inject 2 g into the muscle once.    Historical Provider, MD  cholecalciferol (VITAMIN D) 1000 UNITS tablet Take 1,000 Units by mouth daily.    Historical Provider, MD  clopidogrel (PLAVIX) 75 MG tablet Take 1 tablet (75 mg total) by mouth daily  with breakfast. 01/04/14   Sinda Du, MD  Cranberry 450 MG CAPS Take 1 capsule by mouth daily.    Historical Provider, MD  feeding supplement, GLUCERNA SHAKE, (GLUCERNA SHAKE) LIQD Take 237 mLs by mouth 2 (two) times daily between meals. 01/04/15   Sinda Du, MD  furosemide (LASIX) 20 MG tablet Take 2 tablets (40 mg total) by mouth daily. 01/04/15   Sinda Du, MD  gabapentin (NEURONTIN) 100 MG capsule Take 100 mg by mouth 2 (two) times daily.    Historical Provider, MD  guaiFENesin (MUCINEX) 600 MG 12 hr tablet Take 600 mg by mouth 2 (two) times daily.    Historical Provider, MD  haloperidol (HALDOL) 0.5 MG tablet Take 0.5 mg by mouth daily.     Historical Provider, MD  insulin aspart (NOVOLOG) 100 UNIT/ML injection Inject 0-20 Units into the skin 3 (three) times daily with meals. Patient taking differently: Inject 2-10 Units into the skin 3 (three) times daily with meals. Per sliding scale 140-200= 2 units  201-250= 4 units 251-300= 6 units 301-350= 8 units 351-400=10units 01/04/15   Sinda Du, MD  levofloxacin (LEVAQUIN) 750 MG/150ML SOLN Inject 750 mg into the vein daily. 7 day course starting on 12/23/15    Historical Provider, MD  levothyroxine (SYNTHROID, LEVOTHROID) 100 MCG tablet Take 100 mcg by mouth daily before breakfast.    Historical Provider, MD  loratadine (CLARITIN) 10 MG tablet Take 10 mg by mouth daily.    Historical Provider, MD  metoprolol tartrate (LOPRESSOR) 25 MG tablet Take 0.5 tablets (12.5 mg total) by mouth 2 (two) times daily. 02/05/15   Herminio Commons, MD  nitroGLYCERIN (NITROSTAT) 0.4 MG SL tablet Place 1 tablet (0.4 mg total) under the tongue every 5 (five) minutes as needed for chest pain. 06/13/13   Sinda Du, MD  nystatin cream (MYCOSTATIN) Apply 1 application topically 3 (three) times daily. Applied to inner thighs and groin area at every shift for skin irritation    Historical Provider, MD  pantoprazole (PROTONIX) 40 MG tablet Take 1 tablet  (40 mg total) by mouth daily. 06/13/13   Sinda Du, MD  polyethylene glycol [facility] Floria Raveling) packet Take 17 g by mouth daily.    Historical Provider, MD  potassium chloride SA (K-DUR,KLOR-CON) 20 MEQ tablet Take 2 tablets (40 mEq total) by mouth 2 (two) times daily. 01/04/15   Sinda Du, MD  ranolazine (RANEXA) 500 MG 12 hr tablet Take 1 tablet (500 mg total) by mouth 2 (two) times daily. 11/28/14   Herminio Commons, MD  saccharomyces boulardii (FLORASTOR) 250 MG capsule Take 250 mg by mouth 2 (two)  times daily.    Historical Provider, MD  sertraline (ZOLOFT) 100 MG tablet Take 1 tablet (100 mg total) by mouth daily. 08/13/13   Sinda Du, MD  temazepam (RESTORIL) 7.5 MG capsule Take 7.5 mg by mouth at bedtime as needed for sleep.    Historical Provider, MD  traMADol (ULTRAM) 50 MG tablet Take 50 mg by mouth every 6 (six) hours as needed for moderate pain.    Historical Provider, MD  vancomycin 1,000 mg in sodium chloride 0.9 % 250 mL Inject 1,000 mg into the vein daily. 1g IV every 12 hours for 7 days starting on 12/23/15    Historical Provider, MD  vitamin B-12 (CYANOCOBALAMIN) 1000 MCG tablet Take 1,000 mcg by mouth daily.    Historical Provider, MD     Allergies:  Allergies  Allergen Reactions  . Latex     Provided via MAR  . Naproxen Other (See Comments)    Unknown-Provided via MAR    Social History:   reports that she has never smoked. She has never used smokeless tobacco. She reports that she does not drink alcohol or use illicit drugs.  Family History: Family History  Problem Relation Age of Onset  . Cancer    . Diabetes    . Arthritis    . Asthma    . Heart attack Father   . Heart attack Mother      Physical Exam: Filed Vitals:   12/25/2015 0800 01/06/2016 0830 12/30/2015 0900 12/25/2015 0930  BP: 83/66 81/36 76/54  74/44  Pulse: 117 110 108 107  Resp: 22 22 22 22   Weight:      SpO2: 100% 100% 100% 100%   Blood pressure 74/44, pulse 107, resp. rate 22,  weight 89.812 kg (198 lb), SpO2 100 %.  GEN:  Intubated and sedate.  Breasts:: Not examined CHEST WALL: No tenderness CHEST: Normal respiration, clear to auscultation bilaterally. Mechanically ventilated.  HEART: S1S2 regular.  BACK: No kyphosis or scoliosis; no CVA tenderness ABDOMEN: soft and non-tender; no masses, no organomegaly, normal abdominal bowel sounds; no pannus; no intertriginous candida. There is no rebound and no distention. Rectal Exam: Not done EXTREMITIES: No bone or joint deformity; age-appropriate arthropathy of the hands and knees; no edema; no ulcerations.  There is no calf tenderness. Genitalia: not examined PULSES: 2+ and symmetric SKIN: Normal hydration no rash or ulceration CNS: Comatose.   Labs on Admission:  Basic Metabolic Panel:  Recent Labs Lab 01/20/2016 0706 12/25/2015 0723  NA 144 139  K 5.1 4.9  CL 90* 88*  CO2 29  --   GLUCOSE 365* 352*  BUN 12 10  CREATININE 0.87 0.90  CALCIUM 9.0  --    Liver Function Tests:  Recent Labs Lab 12/31/2015 0706  AST <5*  ALT <5*  ALKPHOS 63  BILITOT 0.8  PROT 5.2*  ALBUMIN 2.9*   CBC:  Recent Labs Lab 01/19/2016 0706 12/28/2015 0723  WBC 11.1*  --   NEUTROABS 6.9  --   HGB 11.5* 12.9  HCT 41.3 38.0  MCV 104.8*  --   PLT 123*  --    Cardiac Enzymes: No results for input(s): CKTOTAL, CKMB, CKMBINDEX, TROPONINI in the last 168 hours.  CBG: No results for input(s): GLUCAP in the last 168 hours.   Radiological Exams on Admission: Dg Chest Port 1 View  12/25/2015  CLINICAL DATA:  Status post adjustment of endotracheal tube EXAM: PORTABLE CHEST - 1 VIEW COMPARISON:  Film from earlier in the same day  FINDINGS: Cardiac shadow is mildly enlarged but stable. An endotracheal tube is again seen and has been withdrawn now lying 4 cm above the carina. Nasogastric catheter is noted extending into the stomach. The lungs are clear with the exception of some mild right basilar atelectasis stable from the prior  study. No bony abnormality is seen. Left-sided PICC line is noted in the proximal SVC. IMPRESSION: Endotracheal tube in satisfactory position. Right basilar atelectasis stable from the prior exam. Electronically Signed   By: Inez Catalina M.D.   On: 01/09/2016 08:56   Dg Chest Port 1 View  01/01/2016  CLINICAL DATA:  CPR in progress. Endotracheal tube placement. NG tube placement. EXAM: PORTABLE CHEST 1 VIEW COMPARISON:  Chest x-ray dated 12/23/2015. FINDINGS: Endotracheal tube tip is seen within the right mainstem bronchus. Enteric tube passes below the diaphragm. Mild cardiomegaly is unchanged. There is central pulmonary vascular congestion and mild bilateral interstitial edema, likely related to resuscitation efforts. No pleural effusions seen. No pneumothorax seen. Osseous structures about the chest are unremarkable. IMPRESSION: 1. Endotracheal tube tip within the right mainstem bronchus. Recommend retracting at least 5 cm for optimal radiographic appearance. 2. Mild cardiomegaly, stable. 3. Central pulmonary vascular congestion and mild bilateral interstitial edema suggesting mild volume overload/ CHF, possibly related to resuscitation efforts. These results were discussed by telephone at the time of interpretation on 12/26/2015 at 8:20 am to Dr. Francine Graven , who verbally acknowledged these results. Electronically Signed   By: Franki Cabot M.D.   On: 01/03/2016 08:22   Assessment/Plan Present on Admission:  . Cardiopulmonary arrest ([place]) . Acute on chronic respiratory failure ([place]) . Hypothyroidism . Hypertension . Cardiac arrest ([place])  PLAN:  POST CARDIAC ARREST CARE:   For now, as discussed, will continue with mechanical ventilation, and start pressor, along with IV steroid and IV antibiotics.  Will place oral GT for meds, and IV PPI prophylaxis.  Her cardiac arrest was prolonged, and her meaningful recovery is unlikely, therefore, family is updated and agree that comfort care will be  implemented if she doesn't respond to this initial treatment.   I will give her IV thyroid meds, and IV steroid, in case her hypotension was due to adrenal insufficiency.  Thank you so much for letting participate in her care.  Good Day.   Other plans as per orders. Code Status: DNR.    Orvan Falconer, MD. FACP [name] Pager [phone] 7pm to 7am.  12/31/2015, 10:21 AM

## 2016-01-11 DIAGNOSIS — I469 Cardiac arrest, cause unspecified: Secondary | ICD-10-CM | POA: Diagnosis not present

## 2016-01-11 LAB — GLUCOSE, CAPILLARY
GLUCOSE-CAPILLARY: 201 mg/dL — AB (ref 65–99)
GLUCOSE-CAPILLARY: 190 mg/dL — AB (ref 65–99)
Glucose-Capillary: 209 mg/dL — ABNORMAL HIGH (ref 65–99)
Glucose-Capillary: 211 mg/dL — ABNORMAL HIGH (ref 65–99)
Glucose-Capillary: 183 mg/dL — ABNORMAL HIGH (ref 65–99)
Glucose-Capillary: 165 mg/dL — ABNORMAL HIGH (ref 65–99)

## 2016-01-11 LAB — COMPREHENSIVE METABOLIC PANEL
ALK PHOS: 51 U/L (ref 38–126)
ALT: 73 U/L — AB (ref 14–54)
ANION GAP: 9 (ref 5–15)
AST: 105 U/L — ABNORMAL HIGH (ref 15–41)
Albumin: 2.9 g/dL — ABNORMAL LOW (ref 3.5–5.0)
BUN: 10 mg/dL (ref 6–20)
CALCIUM: 9.1 mg/dL (ref 8.9–10.3)
CO2: 32 mmol/L (ref 22–32)
CREATININE: 0.62 mg/dL (ref 0.44–1.00)
Chloride: 118 mmol/L — ABNORMAL HIGH (ref 101–111)
Glucose, Bld: 195 mg/dL — ABNORMAL HIGH (ref 65–99)
Potassium: 2.7 mmol/L — CL (ref 3.5–5.1)
Sodium: 159 mmol/L — ABNORMAL HIGH (ref 135–145)
Total Bilirubin: 0.5 mg/dL (ref 0.3–1.2)
Total Protein: 5.3 g/dL — ABNORMAL LOW (ref 6.5–8.1)

## 2016-01-11 LAB — CBC
HCT: 41.4 % (ref 36.0–46.0)
HEMOGLOBIN: 12.3 g/dL (ref 12.0–15.0)
MCH: 28.7 pg (ref 26.0–34.0)
MCHC: 29.7 g/dL — ABNORMAL LOW (ref 30.0–36.0)
MCV: 96.7 fL (ref 78.0–100.0)
PLATELETS: 124 10*3/uL — AB (ref 150–400)
RBC: 4.28 MIL/uL (ref 3.87–5.11)
RDW: 15.1 % (ref 11.5–15.5)
WBC: 6.2 10*3/uL (ref 4.0–10.5)

## 2016-01-11 MED ORDER — VANCOMYCIN HCL 10 G IV SOLR
1250.0000 mg | Freq: Two times a day (BID) | INTRAVENOUS | Status: DC
  Administered 2016-01-11 (×2): 1250 mg via INTRAVENOUS
  Filled 2016-01-11 (×3): qty 1250

## 2016-01-11 MED ORDER — POTASSIUM CHLORIDE 20 MEQ/15ML (10%) PO SOLN
40.0000 meq | ORAL | Status: AC
  Administered 2016-01-11 (×2): 40 meq
  Filled 2016-01-11 (×2): qty 30

## 2016-01-11 NOTE — Progress Notes (Signed)
Subjective: This is a 75 year old who had been living at a skilled care facility and was found down at the facility for an unknown period of time. She received CPR, EMS was called and then intubation was attempted in the field. Resuscitative efforts were continued in the emergency department and she did return a pulse and blood pressure. She is currently in the intensive care unit on a ventilator. She is not on pressors.  Objective: Vital signs in last 24 hours: Temp:  [93.2 F (34 C)-98.7 F (37.1 C)] 98.5 F (36.9 C) (01/22 0800) Pulse Rate:  [47-116] 116 (01/22 0900) Resp:  [16-22] 18 (01/22 0900) BP: (86-139)/(52-90) 104/66 mmHg (01/22 0900) SpO2:  [95 %-100 %] 98 % (01/22 0900) FiO2 (%):  [40 %-50 %] 40 % (01/22 0800) Weight:  [93.2 kg (205 lb 7.5 oz)-98.6 kg (217 lb 6 oz)] 98.6 kg (217 lb 6 oz) (01/22 0500) Weight change: 3.388 kg (7 lb 7.5 oz) Last BM Date: 12/24/2015  Intake/Output from previous day: 01/21 0701 - 01/22 0700 In: 2664.6 [I.V.:1914.6; IV Piggyback:750] Out: 4100 [Urine:1400; Emesis/NG output:2700]  PHYSICAL EXAM General appearance: She is staring straight ahead. She is on no sedation and unresponsive. She is intubated Resp: rhonchi bilaterally Cardio: regular rate and rhythm, S1, S2 normal, no murmur, click, rub or gallop GI: soft, non-tender; bowel sounds normal; no masses,  no organomegaly Extremities: extremities normal, atraumatic, no cyanosis or edema  Lab Results:  Results for orders placed or performed during the hospital encounter of 12/23/2015 (from the past 48 hour(s))  Comprehensive metabolic panel     Status: Abnormal   Collection Time: 01/17/2016  7:06 AM  Result Value Ref Range   Sodium 144 135 - 145 mmol/L   Potassium 5.1 3.5 - 5.1 mmol/L   Chloride 90 (L) 101 - 111 mmol/L   CO2 29 22 - 32 mmol/L   Glucose, Bld 365 (H) 65 - 99 mg/dL   BUN 12 6 - 20 mg/dL   Creatinine, Ser 0.87 0.44 - 1.00 mg/dL   Calcium 9.0 8.9 - 10.3 mg/dL   Total Protein  5.2 (L) 6.5 - 8.1 g/dL   Albumin 2.9 (L) 3.5 - 5.0 g/dL   AST <5 (L) 15 - 41 U/L   ALT <5 (L) 14 - 54 U/L   Alkaline Phosphatase 63 38 - 126 U/L   Total Bilirubin 0.8 0.3 - 1.2 mg/dL   GFR calc non Af Amer >60 >60 mL/min   GFR calc Af Amer >60 >60 mL/min    Comment: (NOTE) The eGFR has been calculated using the CKD EPI equation. This calculation has not been validated in all clinical situations. eGFR's persistently <60 mL/min signify possible Chronic Kidney Disease.    Anion gap 25 (H) 5 - 15  CBC with Differential     Status: Abnormal   Collection Time: 12/23/2015  7:06 AM  Result Value Ref Range   WBC 11.1 (H) 4.0 - 10.5 K/uL   RBC 3.94 3.87 - 5.11 MIL/uL   Hemoglobin 11.5 (L) 12.0 - 15.0 g/dL   HCT 41.3 36.0 - 46.0 %   MCV 104.8 (H) 78.0 - 100.0 fL   MCH 29.2 26.0 - 34.0 pg   MCHC 27.8 (L) 30.0 - 36.0 g/dL   RDW 14.6 11.5 - 15.5 %   Platelets 123 (L) 150 - 400 K/uL   Neutrophils Relative % 62 %   Neutro Abs 6.9 1.7 - 7.7 K/uL   Lymphocytes Relative 28 %   Lymphs  Abs 3.1 0.7 - 4.0 K/uL   Monocytes Relative 5 %   Monocytes Absolute 0.5 0.1 - 1.0 K/uL   Eosinophils Relative 5 %   Eosinophils Absolute 0.6 0.0 - 0.7 K/uL   Basophils Relative 0 %   Basophils Absolute 0.0 0.0 - 0.1 K/uL  I-stat troponin, ED     Status: None   Collection Time: 12/23/2015  7:21 AM  Result Value Ref Range   Troponin i, poc 0.03 0.00 - 0.08 ng/mL   Comment 3            Comment: Due to the release kinetics of cTnI, a negative result within the first hours of the onset of symptoms does not rule out myocardial infarction with certainty. If myocardial infarction is still suspected, repeat the test at appropriate intervals.   I-stat Chem 8, ED     Status: Abnormal   Collection Time: 12/22/2015  7:23 AM  Result Value Ref Range   Sodium 139 135 - 145 mmol/L   Potassium 4.9 3.5 - 5.1 mmol/L   Chloride 88 (L) 101 - 111 mmol/L   BUN 10 6 - 20 mg/dL   Creatinine, Ser 0.90 0.44 - 1.00 mg/dL   Glucose,  Bld 352 (H) 65 - 99 mg/dL   Calcium, Ion 1.07 (L) 1.13 - 1.30 mmol/L   TCO2 32 0 - 100 mmol/L   Hemoglobin 12.9 12.0 - 15.0 g/dL   HCT 38.0 36.0 - 46.0 %  I-Stat CG4 Lactic Acid, ED     Status: Abnormal   Collection Time: 01/16/2016  7:27 AM  Result Value Ref Range   Lactic Acid, Venous 16.51 (HH) 0.5 - 2.0 mmol/L  Urinalysis, Routine w reflex microscopic     Status: Abnormal   Collection Time: 01/05/2016  7:42 AM  Result Value Ref Range   Color, Urine YELLOW YELLOW   APPearance CLOUDY (A) CLEAR   Specific Gravity, Urine >1.030 (H) 1.005 - 1.030   pH 6.5 5.0 - 8.0   Glucose, UA 250 (A) NEGATIVE mg/dL   Hgb urine dipstick LARGE (A) NEGATIVE   Bilirubin Urine NEGATIVE NEGATIVE   Ketones, ur NEGATIVE NEGATIVE mg/dL   Protein, ur >300 (A) NEGATIVE mg/dL   Nitrite NEGATIVE NEGATIVE   Leukocytes, UA TRACE (A) NEGATIVE  Urine microscopic-add on     Status: Abnormal   Collection Time: 01/01/2016  7:42 AM  Result Value Ref Range   Squamous Epithelial / LPF 6-30 (A) NONE SEEN   WBC, UA 6-30 0 - 5 WBC/hpf   RBC / HPF TOO NUMEROUS TO COUNT 0 - 5 RBC/hpf   Bacteria, UA MANY (A) NONE SEEN  Blood gas, arterial     Status: Abnormal   Collection Time: 12/21/2015  7:55 AM  Result Value Ref Range   FIO2 100.00    Delivery systems VENTILATOR    Mode PRESSURE REGULATED VOLUME CONTROL    VT 480 mL   LHR 22 resp/min   Peep/cpap 12.0 cm H20   pH, Arterial 7.444 7.350 - 7.450   pCO2 arterial 46.4 (H) 35.0 - 45.0 mmHg   pO2, Arterial 484 (H) 80.0 - 100.0 mmHg   O2 Saturation ABOVE REPORTABLE RANGE %    Comment: J.CRUISE,RN AT 0811 BY V.LAWSON,RRT1/21/17   Patient temperature 37.0    Collection site LEFT RADIAL    Drawn by [id number]    Sample type ARTERIAL    Allens test (pass/fail) PASS PASS  TSH     Status: None   Collection Time:  01/16/2016 10:37 AM  Result Value Ref Range   TSH 1.489 0.350 - 4.500 uIU/mL  Troponin I     Status: Abnormal   Collection Time: 01/06/2016 10:37 AM  Result Value Ref Range    Troponin I 0.12 (H) <0.031 ng/mL    Comment:        PERSISTENTLY INCREASED TROPONIN VALUES IN THE RANGE OF 0.04-0.49 ng/mL CAN BE SEEN IN:       -UNSTABLE ANGINA       -CONGESTIVE HEART FAILURE       -MYOCARDITIS       -CHEST TRAUMA       -ARRYHTHMIAS       -LATE PRESENTING MYOCARDIAL INFARCTION       -COPD   CLINICAL FOLLOW-UP RECOMMENDED.   MRSA PCR Screening     Status: Abnormal   Collection Time: 12/31/2015 11:07 AM  Result Value Ref Range   MRSA by PCR POSITIVE (A) NEGATIVE    Comment:        The GeneXpert MRSA Assay (FDA approved for NASAL specimens only), is one component of a comprehensive MRSA colonization surveillance program. It is not intended to diagnose MRSA infection nor to guide or monitor treatment for MRSA infections. RESULT CALLED TO, READ BACK BY AND VERIFIED WITH: SCHOENEWITZ L. AT 6440 ON [id number] BY THOMPSON S.   Draw ABG 1 hour after initiation of ventilator     Status: Abnormal   Collection Time: 12/27/2015 11:20 AM  Result Value Ref Range   FIO2 0.50    Delivery systems VENTILATOR    Mode PRESSURE REGULATED VOLUME CONTROL    VT 480 mL   LHR 22 resp/min   Peep/cpap 5.0 cm H20   pH, Arterial 7.520 (H) 7.350 - 7.450   pCO2 arterial 41.5 35.0 - 45.0 mmHg   pO2, Arterial 60.6 (L) 80.0 - 100.0 mmHg   Bicarbonate 33.6 (H) 20.0 - 24.0 mEq/L   Acid-Base Excess 10.1 (H) 0.0 - 2.0 mmol/L   O2 Saturation 94.0 %   Patient temperature 37.0    Collection site LEFT RADIAL    Drawn by [id number]    Sample type ARTERIAL DRAW    Allens test (pass/fail) PASS PASS  Glucose, capillary     Status: None   Collection Time: 12/24/2015 12:07 PM  Result Value Ref Range   Glucose-Capillary 71 65 - 99 mg/dL  Glucose, capillary     Status: Abnormal   Collection Time: 01/02/2016  3:36 PM  Result Value Ref Range   Glucose-Capillary 153 (H) 65 - 99 mg/dL   Comment 1 Notify RN    Comment 2 Document in Chart   Troponin I     Status: Abnormal   Collection Time: 12/25/2015  3:54  PM  Result Value Ref Range   Troponin I 0.13 (H) <0.031 ng/mL    Comment:        PERSISTENTLY INCREASED TROPONIN VALUES IN THE RANGE OF 0.04-0.49 ng/mL CAN BE SEEN IN:       -UNSTABLE ANGINA       -CONGESTIVE HEART FAILURE       -MYOCARDITIS       -CHEST TRAUMA       -ARRYHTHMIAS       -LATE PRESENTING MYOCARDIAL INFARCTION       -COPD   CLINICAL FOLLOW-UP RECOMMENDED.   Lactic acid, plasma     Status: Abnormal   Collection Time: 01/06/2016  3:54 PM  Result Value Ref Range   Lactic Acid, Venous 2.7 (  HH) 0.5 - 2.0 mmol/L    Comment: CRITICAL RESULT CALLED TO, READ BACK BY AND VERIFIED WITH: COVINGTON,L AT 1653 ON 01/13/2016 BY WOODS, M   Glucose, capillary     Status: Abnormal   Collection Time: 12/30/2015  8:04 PM  Result Value Ref Range   Glucose-Capillary 263 (H) 65 - 99 mg/dL   Comment 1 Notify RN   Troponin I     Status: Abnormal   Collection Time: 12/31/2015  9:58 PM  Result Value Ref Range   Troponin I 0.06 (H) <0.031 ng/mL    Comment:        PERSISTENTLY INCREASED TROPONIN VALUES IN THE RANGE OF 0.04-0.49 ng/mL CAN BE SEEN IN:       -UNSTABLE ANGINA       -CONGESTIVE HEART FAILURE       -MYOCARDITIS       -CHEST TRAUMA       -ARRYHTHMIAS       -LATE PRESENTING MYOCARDIAL INFARCTION       -COPD   CLINICAL FOLLOW-UP RECOMMENDED.   Glucose, capillary     Status: Abnormal   Collection Time: 12/27/2015 11:13 PM  Result Value Ref Range   Glucose-Capillary 175 (H) 65 - 99 mg/dL  Glucose, capillary     Status: Abnormal   Collection Time: 01/11/16  3:53 AM  Result Value Ref Range   Glucose-Capillary 201 (H) 65 - 99 mg/dL   Comment 1 Notify RN   Comprehensive metabolic panel     Status: Abnormal   Collection Time: 01/11/16  4:30 AM  Result Value Ref Range   Sodium 159 (H) 135 - 145 mmol/L    Comment: DELTA CHECK NOTED   Potassium 2.7 (LL) 3.5 - 5.1 mmol/L    Comment: DELTA CHECK NOTED CRITICAL RESULT CALLED TO, READ BACK BY AND VERIFIED WITH: WAGNOR,R AT 1610 ON  01/11/2016 BY WOODS,M    Chloride 118 (H) 101 - 111 mmol/L   CO2 32 22 - 32 mmol/L   Glucose, Bld 195 (H) 65 - 99 mg/dL   BUN 10 6 - 20 mg/dL   Creatinine, Ser 0.62 0.44 - 1.00 mg/dL   Calcium 9.1 8.9 - 10.3 mg/dL   Total Protein 5.3 (L) 6.5 - 8.1 g/dL   Albumin 2.9 (L) 3.5 - 5.0 g/dL   AST 105 (H) 15 - 41 U/L   ALT 73 (H) 14 - 54 U/L   Alkaline Phosphatase 51 38 - 126 U/L   Total Bilirubin 0.5 0.3 - 1.2 mg/dL   GFR calc non Af Amer >60 >60 mL/min   GFR calc Af Amer >60 >60 mL/min    Comment: (NOTE) The eGFR has been calculated using the CKD EPI equation. This calculation has not been validated in all clinical situations. eGFR's persistently <60 mL/min signify possible Chronic Kidney Disease.    Anion gap 9 5 - 15  CBC     Status: Abnormal   Collection Time: 01/11/16  4:30 AM  Result Value Ref Range   WBC 6.2 4.0 - 10.5 K/uL   RBC 4.28 3.87 - 5.11 MIL/uL   Hemoglobin 12.3 12.0 - 15.0 g/dL   HCT 41.4 36.0 - 46.0 %   MCV 96.7 78.0 - 100.0 fL    Comment: DELTA CHECK NOTED RESULT REPEATED AND VERIFIED    MCH 28.7 26.0 - 34.0 pg   MCHC 29.7 (L) 30.0 - 36.0 g/dL   RDW 15.1 11.5 - 15.5 %   Platelets 124 (L) 150 - 400  K/uL  Glucose, capillary     Status: Abnormal   Collection Time: 01/11/16  7:52 AM  Result Value Ref Range   Glucose-Capillary 190 (H) 65 - 99 mg/dL   Comment 1 Notify RN    Comment 2 Document in Chart     ABGS  Recent Labs  01/20/2016 0723  01/01/2016 1120  PHART  --   < > 7.520*  PO2ART  --   < > 60.6*  TCO2 32  --   --   HCO3  --   --  33.6*  < > = values in this interval not displayed. CULTURES Recent Results (from the past 240 hour(s))  MRSA PCR Screening     Status: Abnormal   Collection Time: 12/27/2015 11:07 AM  Result Value Ref Range Status   MRSA by PCR POSITIVE (A) NEGATIVE Final    Comment:        The GeneXpert MRSA Assay (FDA approved for NASAL specimens only), is one component of a comprehensive MRSA colonization surveillance program.  It is not intended to diagnose MRSA infection nor to guide or monitor treatment for MRSA infections. RESULT CALLED TO, READ BACK BY AND VERIFIED WITH: SCHOENEWITZ L. AT 1610 ON [id number] BY THOMPSON S.    Studies/Results: Dg Chest Port 1 View  01/09/2016  CLINICAL DATA:  Status post adjustment of endotracheal tube EXAM: PORTABLE CHEST - 1 VIEW COMPARISON:  Film from earlier in the same day FINDINGS: Cardiac shadow is mildly enlarged but stable. An endotracheal tube is again seen and has been withdrawn now lying 4 cm above the carina. Nasogastric catheter is noted extending into the stomach. The lungs are clear with the exception of some mild right basilar atelectasis stable from the prior study. No bony abnormality is seen. Left-sided PICC line is noted in the proximal SVC. IMPRESSION: Endotracheal tube in satisfactory position. Right basilar atelectasis stable from the prior exam. Electronically Signed   By: Inez Catalina M.D.   On: 12/27/2015 08:56   Dg Chest Port 1 View  01/18/2016  CLINICAL DATA:  CPR in progress. Endotracheal tube placement. NG tube placement. EXAM: PORTABLE CHEST 1 VIEW COMPARISON:  Chest x-ray dated 12/23/2015. FINDINGS: Endotracheal tube tip is seen within the right mainstem bronchus. Enteric tube passes below the diaphragm. Mild cardiomegaly is unchanged. There is central pulmonary vascular congestion and mild bilateral interstitial edema, likely related to resuscitation efforts. No pleural effusions seen. No pneumothorax seen. Osseous structures about the chest are unremarkable. IMPRESSION: 1. Endotracheal tube tip within the right mainstem bronchus. Recommend retracting at least 5 cm for optimal radiographic appearance. 2. Mild cardiomegaly, stable. 3. Central pulmonary vascular congestion and mild bilateral interstitial edema suggesting mild volume overload/ CHF, possibly related to resuscitation efforts. These results were discussed by telephone at the time of interpretation  on 01/08/2016 at 8:20 am to Dr. Francine Graven , who verbally acknowledged these results. Electronically Signed   By: Franki Cabot M.D.   On: 12/29/2015 08:22    Medications:  Prior to Admission:  Prescriptions prior to admission  Medication Sig Dispense Refill Last Dose  . acetaminophen (TYLENOL) 500 MG tablet Take 1,000 mg by mouth every 6 (six) hours as needed for mild pain or moderate pain.   01/20/2016 at Unknown time  . albuterol (ACCUNEB) 0.63 MG/3ML nebulizer solution Take 1 ampule by nebulization every 6 (six) hours.   01/05/2016 at Unknown time  . albuterol (PROVENTIL HFA;VENTOLIN HFA) 108 (90 BASE) MCG/ACT inhaler Inhale 2 puffs into the  lungs every 4 (four) hours as needed. Shortness of breath 1 Inhaler 12 01/20/2016 at Unknown time  . ALPRAZolam (XANAX) 0.5 MG tablet Take 1 tablet (0.5 mg total) by mouth 3 (three) times daily as needed for anxiety. (Patient taking differently: Take 0.25 mg by mouth every 6 (six) hours. )  0 01/09/2016 at Unknown time  . aspirin EC 81 MG tablet Take 81 mg by mouth daily.   01/09/2016 at Unknown time  . atorvastatin (LIPITOR) 40 MG tablet Take 40 mg by mouth daily.   01/09/2016 at Unknown time  . baclofen (LIORESAL) 10 MG tablet Take 10 mg by mouth 3 (three) times daily.    01/09/2016 at Unknown time  . cholecalciferol (VITAMIN D) 1000 UNITS tablet Take 1,000 Units by mouth daily.   01/09/2016 at Unknown time  . clopidogrel (PLAVIX) 75 MG tablet Take 1 tablet (75 mg total) by mouth daily with breakfast.   01/09/2016 at 1000  . Cranberry 450 MG CAPS Take 1 capsule by mouth daily.   01/09/2016 at Unknown time  . feeding supplement, GLUCERNA SHAKE, (GLUCERNA SHAKE) LIQD Take 237 mLs by mouth 2 (two) times daily between meals. 60 Can 0 01/09/2016 at Unknown time  . furosemide (LASIX) 20 MG tablet Take 2 tablets (40 mg total) by mouth daily. 30 tablet 12 01/09/2016 at Unknown time  . gabapentin (NEURONTIN) 100 MG capsule Take 100 mg by mouth 2 (two) times daily.    01/09/2016 at Unknown time  . haloperidol (HALDOL) 0.5 MG tablet Take 0.5 mg by mouth daily.    01/09/2016 at Unknown time  . HEPARIN LOCK FLUSH IV Inject 5 mLs into the vein 3 (three) times daily. Using SASH method   01/09/2016 at Unknown time  . levothyroxine (SYNTHROID, LEVOTHROID) 100 MCG tablet Take 100 mcg by mouth daily before breakfast.    unknown  . loratadine (CLARITIN) 10 MG tablet Take 10 mg by mouth daily.   01/09/2016 at Unknown time  . metoprolol tartrate (LOPRESSOR) 25 MG tablet Take 0.5 tablets (12.5 mg total) by mouth 2 (two) times daily. 180 tablet 3 01/09/2016 at Unknown time  . nitroGLYCERIN (NITROSTAT) 0.4 MG SL tablet Place 1 tablet (0.4 mg total) under the tongue every 5 (five) minutes as needed for chest pain. 25 tablet 12 unknown  . potassium chloride SA (K-DUR,KLOR-CON) 20 MEQ tablet Take 2 tablets (40 mEq total) by mouth 2 (two) times daily. 60 tablet 12 01/09/2016 at Unknown time  . ranolazine (RANEXA) 500 MG 12 hr tablet Take 1 tablet (500 mg total) by mouth 2 (two) times daily. 60 tablet 3 01/09/2016 at Unknown time  . sertraline (ZOLOFT) 100 MG tablet Take 100 mg by mouth at bedtime.    unknown  . sodium chloride 0.9 % injection Inject 10 mLs into the vein 3 (three) times daily. Using SASH method.   01/09/2016 at Unknown time  . temazepam (RESTORIL) 7.5 MG capsule Take 7.5 mg by mouth at bedtime as needed for sleep.   Past Week at Unknown time  . traMADol (ULTRAM) 50 MG tablet Take 50 mg by mouth every 6 (six) hours as needed for moderate pain.   01/07/2016 at Unknown time  . ZINC OXIDE, TOPICAL, 10 % CREA Apply 1 application topically 3 (three) times daily as needed (to inner thighs for irratation.).   Unknown  . [DISCONTINUED] levothyroxine (SYNTHROID, LEVOTHROID) 100 MCG tablet Take 100 mcg by mouth daily before breakfast.   12/22/2015 at 600a   Scheduled: . albuterol  2.5 mg Nebulization Q6H  . antiseptic oral rinse  7 mL Mouth Rinse 10 times per day  . chlorhexidine  gluconate  15 mL Mouth Rinse BID  . Chlorhexidine Gluconate Cloth  6 each Topical Q0600  . enoxaparin (LOVENOX) injection  40 mg Subcutaneous Q24H  . insulin aspart  0-20 Units Subcutaneous 6 times per day  . levothyroxine  50 mcg Intravenous Daily  . methylPREDNISolone (SOLU-MEDROL) injection  40 mg Intravenous Q6H  . mupirocin ointment  1 application Nasal BID  . pantoprazole (PROTONIX) IV  40 mg Intravenous Q12H  . piperacillin-tazobactam (ZOSYN)  IV  3.375 g Intravenous Q8H  . sodium chloride  3 mL Intravenous Q12H  . vancomycin  1,250 mg Intravenous Q12H   Continuous: . dextrose 5 % and 0.9% NaCl 125 mL/hr at 12/28/2015 2200  . DOPamine     XBM:[id number], ondansetron **OR** ondansetron (ZOFRAN) IV  Assesment: She had cardiopulmonary arrest and prolonged resuscitative efforts. I think she's had significant anoxic brain injury. I discussed that with her family at bedside. They request another 24 hours of support to see if she makes any recovery.  She has acute on chronic respiratory failure on the ventilator. Principal Problem:   Cardiopulmonary arrest [facility]) Active Problems:   Hypertension   Hypothyroidism   Acute on chronic respiratory failure ([place])   Cardiac arrest ([place])    Plan: Continue current treatments. Requested neurology consultation tomorrow.    LOS: 1 day   Ludella Pranger L 01/11/2016, 9:55 AM

## 2016-01-11 NOTE — Progress Notes (Signed)
ANTIBIOTIC CONSULT NOTE - Follow up  Pharmacy Consult for Vancomycin & Zosyn Indication: pneumonia  Allergies  Allergen Reactions  . Latex     Provided via MAR  . Naproxen Other (See Comments)    Unknown-Provided via MAR    Patient Measurements: Height: 5\' 2"  (157.5 cm) Weight: 217 lb 6 oz (98.6 kg) IBW/kg (Calculated) : 50.1 Adjusted Body Weight:   Vital Signs: Temp: 98.5 F (36.9 C) (01/22 0800) Temp Source: Rectal (01/22 0400) BP: 104/66 mmHg (01/22 0900) Pulse Rate: 116 (01/22 0900) Intake/Output from previous day: 01/21 0701 - 01/22 0700 In: 2664.6 [I.V.:1914.6; IV Piggyback:750] Out: 4100 [Urine:1400; Emesis/NG output:2700] Intake/Output from this shift: Total I/O In: -  Out: 1000 [Urine:1000]  Labs:  Recent Labs  01/15/2016 0706 01/07/2016 0723 01/11/16 0430  WBC 11.1*  --  6.2  HGB 11.5* 12.9 12.3  PLT 123*  --  124*  CREATININE 0.87 0.90 0.62   Estimated Creatinine Clearance: 67.7 mL/min (by C-G formula based on Cr of 0.62). No results for input(s): VANCOTROUGH, VANCOPEAK, VANCORANDOM, GENTTROUGH, GENTPEAK, GENTRANDOM, TOBRATROUGH, TOBRAPEAK, TOBRARND, AMIKACINPEAK, AMIKACINTROU, AMIKACIN in the last 72 hours.   Microbiology: Recent Results (from the past 720 hour(s))  MRSA PCR Screening     Status: Abnormal   Collection Time: 12/26/2015 11:07 AM  Result Value Ref Range Status   MRSA by PCR POSITIVE (A) NEGATIVE Final    Comment:        The GeneXpert MRSA Assay (FDA approved for NASAL specimens only), is one component of a comprehensive MRSA colonization surveillance program. It is not intended to diagnose MRSA infection nor to guide or monitor treatment for MRSA infections. RESULT CALLED TO, READ BACK BY AND VERIFIED WITH: SCHOENEWITZ L. AT [id number] ON [id number] BY THOMPSON S.     Medical History: Past Medical History  Diagnosis Date  . Cerebral palsy ([place])   . Asthma   . Seasonal allergies   . Thyroid disease   . HTN (hypertension)   .  Anxiety   . Arthritis   . Anginal pain ([place])   . Osteoporosis 05/08/2012  . Osteoporosis 05/08/2012  . Stroke ([place])   . Angina at rest [facility])   . Chronic steroid use   . Pneumonia 09/2013  . Dysphagia   . Hypopotassemia   . Respiratory failure ([place])   . Generalized weakness   . Urinary retention   . Dysphagia   . Acute MI ([place])   . Hypopotassemia   . Sepsis ([place])   . Neurogenic bladder   . Reflux   . Obesity   . Staph infection January 2016  . Clostridium difficile infection January 2016  . Acute respiratory failure ([place])   . Breast cancer ([place])     Right breast, infiltrating ductal.  . Breast cancer, right breast ([place])   . Breast cancer ([place])   . Breast cancer, right breast ([place])   . Breast CA ([facility])     Medications:  Scheduled:  . albuterol  2.5 mg Nebulization Q6H  . antiseptic oral rinse  7 mL Mouth Rinse 10 times per day  . chlorhexidine gluconate  15 mL Mouth Rinse BID  . Chlorhexidine Gluconate Cloth  6 each Topical Q0600  . enoxaparin (LOVENOX) injection  40 mg Subcutaneous Q24H  . insulin aspart  0-20 Units Subcutaneous 6 times per day  . levothyroxine  50 mcg Intravenous Daily  . methylPREDNISolone (SOLU-MEDROL) injection  40 mg Intravenous Q6H  . mupirocin ointment  1 application Nasal BID  .  pantoprazole (PROTONIX) IV  40 mg Intravenous Q12H  . piperacillin-tazobactam (ZOSYN)  IV  3.375 g Intravenous Q8H  . sodium chloride  3 mL Intravenous Q12H  . vancomycin  1,250 mg Intravenous Q12H   Assessment: 75 yo female admitted from ED. with hx of cerebral palsy, hypothyroidism, HTN, prior CVA, chronic steroid use, hx of breast cancer, non ambulatory, from SNF facility, was "fine yesterday" per her sister, found unresponsive and staff started CPR as she found in full cardiac arrest for unclear duration. Prolonged ACLS protocol. Intubated, sedated. Non invasive care including pressor and antibiotics. Likely to focus on comfort care if no significant improvement CrCl  67.7 ml/min   Goal of Therapy:  Vancomycin trough level 15-20 mcg/ml  Plan:  Change Vancomycin to 1250 mg IV every 12 hours Continue Zosyn 3.375 GM IV every 8 hours, infuse each dose over 4 hours Vancomycin trough at steady state Labs per protocol  Abner Greenspan, Kaushik Maul Bennett 01/11/2016,9:28 AM

## 2016-01-11 NOTE — Progress Notes (Signed)
eLink Physician-Brief Progress Note Patient Name: [name] DOB: 1941/08/27 MRN: [id number]   Date of Service  01/11/2016  HPI/Events of Note  Hypokalemia  eICU Interventions  Potassium replaced     Intervention Category Intermediate Interventions: Electrolyte abnormality - evaluation and management  DETERDING,ELIZABETH 01/11/2016, 6:03 AM

## 2016-01-11 NOTE — Progress Notes (Signed)
Critical potassium of 2.7 md made aware via text page

## 2016-01-12 LAB — BASIC METABOLIC PANEL
BUN: 10 mg/dL (ref 6–20)
CO2: 31 mmol/L (ref 22–32)
Calcium: 9.7 mg/dL (ref 8.9–10.3)
Creatinine, Ser: 0.83 mg/dL (ref 0.44–1.00)
GFR calc non Af Amer: >60 mL/min (ref >60)
GLUCOSE: 226 mg/dL — AB (ref 65–99)
POTASSIUM: 2.5 mmol/L — AB (ref 3.5–5.1)
Sodium: 181 mmol/L (ref 135–145)

## 2016-01-12 LAB — BLOOD GAS, ARTERIAL
ACID-BASE EXCESS: 7.5 mmol/L — AB (ref 0.0–2.0)
Bicarbonate: 31.5 mEq/L — ABNORMAL HIGH (ref 20.0–24.0)
DRAWN BY: 33100
FIO2: 0.4
MECHVT: 400 mL
O2 Saturation: 97.9 %
PATIENT TEMPERATURE: 37.1
PCO2 ART: 37.5 mmHg (ref 35.0–45.0)
PEEP/CPAP: 5 cmH2O
PO2 ART: 96.4 mmHg (ref 80.0–100.0)
RATE: 18 resp/min
TCO2: 16 mmol/L (ref 0–100)
pH, Arterial: 7.525 — ABNORMAL HIGH (ref 7.350–7.450)

## 2016-01-12 LAB — URINE CULTURE

## 2016-01-12 LAB — GLUCOSE, CAPILLARY: GLUCOSE-CAPILLARY: 167 mg/dL — AB (ref 65–99)

## 2016-01-12 MED ORDER — MORPHINE BOLUS VIA INFUSION
5.0000 mg | INTRAVENOUS | Status: DC | PRN
  Filled 2016-01-12: qty 20

## 2016-01-12 MED ORDER — MIDAZOLAM BOLUS VIA INFUSION
5.0000 mg | INTRAVENOUS | Status: DC | PRN
  Filled 2016-01-12: qty 20

## 2016-01-12 MED ORDER — SODIUM CHLORIDE 0.9 % IJ SOLN: 10.0000 mL | INTRAMUSCULAR | Status: DC | PRN

## 2016-01-12 MED ORDER — SODIUM CHLORIDE 0.9 % IJ SOLN: 10.0000 mL | Freq: Two times a day (BID) | INTRAMUSCULAR | Status: DC

## 2016-01-12 MED ORDER — DEXTROSE 5 % IV SOLN
10.0000 mg/h | INTRAVENOUS | Status: DC
Start: 2016-01-12 — End: 2016-01-12
  Filled 2016-01-12: qty 10

## 2016-01-12 MED ORDER — MIDAZOLAM HCL 5 MG/ML IJ SOLN
10.0000 mg/h | INTRAMUSCULAR | Status: DC
  Filled 2016-01-12: qty 10

## 2016-01-12 MED ORDER — DEXTROSE 5 % IV SOLN
INTRAVENOUS | Status: DC
  Administered 2016-01-12: 07:00:00 via INTRAVENOUS

## 2016-01-12 MED ORDER — POTASSIUM CHLORIDE 10 MEQ/100ML IV SOLN
10.0000 meq | INTRAVENOUS | Status: DC
  Administered 2016-01-12: 10 meq via INTRAVENOUS
  Filled 2016-01-12: qty 100

## 2016-01-12 MED FILL — Medication: Qty: 1 | Status: AC

## 2016-01-13 DIAGNOSIS — G931 Anoxic brain damage, not elsewhere classified: Secondary | ICD-10-CM | POA: Diagnosis present

## 2016-01-13 DIAGNOSIS — E232 Diabetes insipidus: Secondary | ICD-10-CM | POA: Diagnosis not present

## 2016-01-13 DIAGNOSIS — E870 Hyperosmolality and hypernatremia: Secondary | ICD-10-CM | POA: Diagnosis present

## 2016-01-21 NOTE — Progress Notes (Signed)
Family leaving at this time. RT on floor to extubate patient.

## 2016-01-21 NOTE — Progress Notes (Addendum)
Wasted 250cc of IV Morphine and 50cc of IV Versed in sink with Donneta Romberg, RN. Schonewitz, Eulis Canner 24-Jan-2016 1:02 PM   Witnessed by Donneta Romberg, RN

## 2016-01-21 NOTE — Clinical Documentation Improvement (Signed)
Pulmonology  Can the diagnosis of "Unresponsiveness" be further specified? Please document findings in next progress note; NOT in BPA drop down box. Thanks!   Brain Dead  Coma  Persistent Vegetative State  Other  Clinically Undetermined  Supporting Information:  "Intubated sedated unresponsive. Her pupils do not react. She does not have a gag reflex. I took her off the ventilator for about 2-1/2 minutes and allowed her to become somewhat hypoxic and she had no respiratory effort whatsoever"   Please exercise your independent, professional judgment when responding. A specific answer is not anticipated or expected.  Thank You, Zoila Shutter RN, BSN, [place] [phone]; Cell: [phone]

## 2016-01-21 NOTE — Progress Notes (Signed)
Family at bedside with patient stating that they do not want to be present when patient is extubated or if patient was to pass. Encouraged family to spend as much time with patient as they would like.

## 2016-01-21 NOTE — Procedures (Signed)
Extubation Procedure Note  Patient Details:   Name: [name] DOB: Feb 19, 1941 MRN: [id number]   Airway Documentation:  Airway 7 mm (Active)  Secured at (cm) 20 cm 01-30-16  8:09 AM  Measured From Lips 30-Jan-2016  8:09 AM  Secured Location Right 01/30/16  8:09 AM  Secured By [employer] Jan 30, 2016  8:09 AM  Tube Holder Repositioned Yes 2016/01/30  8:09 AM  Cuff Pressure (cm H2O) 26 cm H2O 01/11/2016  7:37 PM  Site Condition Dry 01-30-2016  5:08 AM    Evaluation  O2 sats: transiently fell during during procedure Complications: No apparent complications Patient did tolerate procedure well. Bilateral Breath Sounds: Clear Suctioning: Airway No  Elsie Stain Jan 30, 2016, 11:35 AM

## 2016-01-21 NOTE — Clinical Documentation Improvement (Signed)
Internal Medicine Pulmonology  Can the diagnosis of "Hypotension" be further specified? Please document findings in next progress note; NOT in BPA drop down box. Thanks!   Other  Clinically Undetermined  Supporting Information:  "hypotensive with SBP of 60's" noted in H&P; Maps 55,53  Dopamine infusion initiated  Please exercise your independent, professional judgment when responding. A specific answer is not anticipated or expected.  Thank You, Zoila Shutter RN, BSN, [place] [phone]; Cell: [phone]

## 2016-01-21 NOTE — Progress Notes (Signed)
Pt picked up by [facility] @ 1325.  Death certificate to be signed by Dr. Luan Pulling who is not in the hospital.  No belongings in the pt's room.

## 2016-01-21 NOTE — Discharge Summary (Signed)
Physician Discharge Summary  Patient ID: [name] MRN: [id number] DOB/AGE: [birth year] 75 y.o. Primary Care Physician:Toia Micale L, MD Admit date: 01/05/2016 Discharge date: 01/13/2016    Discharge Diagnoses:   Principal Problem:   Cardiopulmonary arrest [facility]) Active Problems:   Cerebral palsy ([place])   Thrombocytopenia ([place])   Hypertension   Hypothyroidism   Encephalopathy, metabolic   COPD (chronic obstructive pulmonary disease) ([facility])   Hypokalemia   Acute on chronic respiratory failure ([place])   Cardiac arrest ([facility])   Anoxic brain injury ([place])   Diabetes insipidus ([place])   Hypernatremia     Medication List    ASK your doctor about these medications        acetaminophen 500 MG tablet  Commonly known as:  TYLENOL  Take 1,000 mg by mouth every 6 (six) hours as needed for mild pain or moderate pain.     albuterol 0.63 MG/3ML nebulizer solution  Commonly known as:  ACCUNEB  Take 1 ampule by nebulization every 6 (six) hours.     albuterol 108 (90 Base) MCG/ACT inhaler  Commonly known as:  PROVENTIL HFA;VENTOLIN HFA  Inhale 2 puffs into the lungs every 4 (four) hours as needed. Shortness of breath     ALPRAZolam 0.5 MG tablet  Commonly known as:  XANAX  Take 1 tablet (0.5 mg total) by mouth 3 (three) times daily as needed for anxiety.     aspirin EC 81 MG tablet  Take 81 mg by mouth daily.     atorvastatin 40 MG tablet  Commonly known as:  LIPITOR  Take 40 mg by mouth daily.     baclofen 10 MG tablet  Commonly known as:  LIORESAL  Take 10 mg by mouth 3 (three) times daily.     cholecalciferol 1000 units tablet  Commonly known as:  VITAMIN D  Take 1,000 Units by mouth daily.     clopidogrel 75 MG tablet  Commonly known as:  PLAVIX  Take 1 tablet (75 mg total) by mouth daily with breakfast.     Cranberry 450 MG Caps  Take 1 capsule by mouth daily.     feeding supplement (GLUCERNA SHAKE) Liqd  Take 237 mLs by mouth 2 (two) times daily between meals.      furosemide 20 MG tablet  Commonly known as:  LASIX  Take 2 tablets (40 mg total) by mouth daily.     gabapentin 100 MG capsule  Commonly known as:  NEURONTIN  Take 100 mg by mouth 2 (two) times daily.     haloperidol 0.5 MG tablet  Commonly known as:  HALDOL  Take 0.5 mg by mouth daily.     HEPARIN LOCK FLUSH IV  Inject 5 mLs into the vein 3 (three) times daily. Using SASH method     levothyroxine 100 MCG tablet  Commonly known as:  SYNTHROID, LEVOTHROID  Take 100 mcg by mouth daily before breakfast.     loratadine 10 MG tablet  Commonly known as:  CLARITIN  Take 10 mg by mouth daily.     metoprolol tartrate 25 MG tablet  Commonly known as:  LOPRESSOR  Take 0.5 tablets (12.5 mg total) by mouth 2 (two) times daily.     nitroGLYCERIN 0.4 MG SL tablet  Commonly known as:  NITROSTAT  Place 1 tablet (0.4 mg total) under the tongue every 5 (five) minutes as needed for chest pain.     potassium chloride SA 20 MEQ tablet  Commonly known as:  K-DUR,KLOR-CON  Take 2 tablets (40 mEq total) by mouth 2 (two) times daily.     ranolazine 500 MG 12 hr tablet  Commonly known as:  RANEXA  Take 1 tablet (500 mg total) by mouth 2 (two) times daily.     sertraline 100 MG tablet  Commonly known as:  ZOLOFT  Take 100 mg by mouth at bedtime.     sodium chloride 0.9 % injection  Inject 10 mLs into the vein 3 (three) times daily. Using SASH method.     temazepam 7.5 MG capsule  Commonly known as:  RESTORIL  Take 7.5 mg by mouth at bedtime as needed for sleep.     traMADol 50 MG tablet  Commonly known as:  ULTRAM  Take 50 mg by mouth every 6 (six) hours as needed for moderate pain.     ZINC OXIDE (TOPICAL) 10 % Crea  Apply 1 application topically 3 (three) times daily as needed (to inner thighs for irratation.).        Discharged Condition: Deceased    Consults: None  Significant Diagnostic Studies: Dg Chest 1 View  12/24/2015  CLINICAL DATA:  PICC placement.  Initial  encounter. EXAM: CHEST 1 VIEW COMPARISON:  Chest radiograph performed 02/12/2015 FINDINGS: The patient's right PICC is noted ending about the right brachiocephalic vein just proximal to the superior vena cava. The lungs are hypoexpanded. Vascular congestion is noted, with mild bibasilar opacities, possibly reflecting minimal interstitial edema. There is no evidence of significant pleural effusion or pneumothorax. The cardiomediastinal silhouette is borderline normal in size. No acute osseous abnormalities are seen. IMPRESSION: 1. Right PICC noted ending about the right brachiocephalic vein just proximal to the superior vena cava. 2. Lungs hypoexpanded. Vascular congestion, with mild bibasilar opacities, possibly reflecting minimal interstitial edema. Electronically Signed   By: Garald Balding M.D.   On: 12/24/2015 00:20   Dg Chest Port 1 View  12/27/2015  CLINICAL DATA:  Status post adjustment of endotracheal tube EXAM: PORTABLE CHEST - 1 VIEW COMPARISON:  Film from earlier in the same day FINDINGS: Cardiac shadow is mildly enlarged but stable. An endotracheal tube is again seen and has been withdrawn now lying 4 cm above the carina. Nasogastric catheter is noted extending into the stomach. The lungs are clear with the exception of some mild right basilar atelectasis stable from the prior study. No bony abnormality is seen. Left-sided PICC line is noted in the proximal SVC. IMPRESSION: Endotracheal tube in satisfactory position. Right basilar atelectasis stable from the prior exam. Electronically Signed   By: Inez Catalina M.D.   On: 01/06/2016 08:56   Dg Chest Port 1 View  01/03/2016  CLINICAL DATA:  CPR in progress. Endotracheal tube placement. NG tube placement. EXAM: PORTABLE CHEST 1 VIEW COMPARISON:  Chest x-ray dated 12/23/2015. FINDINGS: Endotracheal tube tip is seen within the right mainstem bronchus. Enteric tube passes below the diaphragm. Mild cardiomegaly is unchanged. There is central pulmonary  vascular congestion and mild bilateral interstitial edema, likely related to resuscitation efforts. No pleural effusions seen. No pneumothorax seen. Osseous structures about the chest are unremarkable. IMPRESSION: 1. Endotracheal tube tip within the right mainstem bronchus. Recommend retracting at least 5 cm for optimal radiographic appearance. 2. Mild cardiomegaly, stable. 3. Central pulmonary vascular congestion and mild bilateral interstitial edema suggesting mild volume overload/ CHF, possibly related to resuscitation efforts. These results were discussed by telephone at the time of interpretation on 01/20/2016 at 8:20 am to Dr. Francine Graven , who verbally acknowledged these  results. Electronically Signed   By: Franki Cabot M.D.   On: 01/17/2016 08:22    Lab Results: Basic Metabolic Panel:  Recent Labs  01/11/16 0430 Jan 19, 2016 0430  NA 159* 181*  K 2.7* 2.5*  CL 118* >130*  CO2 32 31  GLUCOSE 195* 226*  BUN 10 10  CREATININE 0.62 0.83  CALCIUM 9.1 9.7   Liver Function Tests:  Recent Labs  01/11/16 0430  AST 105*  ALT 73*  ALKPHOS 51  BILITOT 0.5  PROT 5.3*  ALBUMIN 2.9*     CBC:  Recent Labs  01/11/16 0430  WBC 6.2  HGB 12.3  HCT 41.4  MCV 96.7  PLT 124*    Recent Results (from the past 240 hour(s))  Urine culture     Status: None   Collection Time: 01/09/2016  7:42 AM  Result Value Ref Range Status   Specimen Description URINE, CATHETERIZED  Final   Special Requests NONE  Final   Culture   Final    MULTIPLE SPECIES PRESENT, SUGGEST RECOLLECTION Performed at [facility]    Report Status Jan 19, 2016 FINAL  Final  MRSA PCR Screening     Status: Abnormal   Collection Time: 12/30/2015 11:07 AM  Result Value Ref Range Status   MRSA by PCR POSITIVE (A) NEGATIVE Final    Comment:        The GeneXpert MRSA Assay (FDA approved for NASAL specimens only), is one component of a comprehensive MRSA colonization surveillance program. It is not intended to  diagnose MRSA infection nor to guide or monitor treatment for MRSA infections. RESULT CALLED TO, READ BACK BY AND VERIFIED WITH: SCHOENEWITZ L. AT [id number] ON [id number] BY THOMPSON S.      Hospital Course: This is a 75 year old who was found down at her skilled care facility. It was unclear how long she had been down. CPR was performed EMS was called. Intubation was attempted in the field but was not successful. CPR was continued once she arrived at the emergency department she was intubated in the emergency department and she was able to be resuscitated but it was felt that she probably had had severe anoxic brain injury. She was unresponsive had no spontaneous respirations. Her family requested that she be supported at least briefly to see if she made any recovery. She did not make any recovery. She still had no response her pupils were fixed and dilated no gag reflex and no spontaneous respirations. Terminal weaning was undertaken and she died comfortably  Discharge Exam: Blood pressure 100/65, pulse 78, temperature 98.2 F (36.8 C), temperature source Rectal, resp. rate 18, height 5\' 2"  (1.575 m), weight 96.2 kg (212 lb 1.3 oz), SpO2 83 %. Not applicable  Disposition: To the funeral home      Signed: Lameeka Schleifer L   01/13/2016, 9:07 AM

## 2016-01-21 NOTE — Progress Notes (Signed)
Subjective: She remains unresponsive. She is on the ventilator. 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 Her sodium level is very high today.  Objective: Vital signs in last 24 hours: Temp:  [98.3 F (36.8 C)-98.9 F (37.2 C)] 98.3 F (36.8 C) (01/23 0400) Pulse Rate:  [37-116] 92 (01/23 0508) Resp:  [18] 18 (01/23 0508) BP: (74-115)/(41-83) 100/63 mmHg (01/23 0508) SpO2:  [97 %-100 %] 98 % (01/23 0508) FiO2 (%):  [40 %] 40 % (01/23 0508) Weight:  [96.2 kg (212 lb 1.3 oz)] 96.2 kg (212 lb 1.3 oz) (01/23 0500) Weight change: 3 kg (6 lb 9.8 oz) Last BM Date: 01/15/2016  Intake/Output from previous day: 01/22 0701 - 01/23 0700 In: 3275 [I.V.:2625; IV Piggyback:650] Out: 1497 [Urine:5450]  PHYSICAL EXAM General appearance: Intubated sedated unresponsive. Her pupils do not react. She does not have a gag reflex. I took her off the ventilator for about 2-1/2 minutes and allowed her to become somewhat hypoxic and she had no respiratory effort whatsoever Resp: clear to auscultation bilaterally Cardio: regular rate and rhythm, S1, S2 normal, no murmur, click, rub or gallop GI: soft, non-tender; bowel sounds normal; no masses,  no organomegaly Extremities: extremities normal, atraumatic, no cyanosis or edema  Lab Results:  Results for orders placed or performed during the hospital encounter of 12/29/2015 (from the past 48 hour(s))  Urinalysis, Routine w reflex microscopic     Status: Abnormal   Collection Time: 12/22/2015  7:42 AM  Result Value Ref Range   Color, Urine YELLOW YELLOW   APPearance CLOUDY (A) CLEAR   Specific Gravity, Urine >1.030 (H) 1.005 - 1.030   pH 6.5 5.0 - 8.0   Glucose, UA 250 (A) NEGATIVE mg/dL   Hgb urine dipstick LARGE (A) NEGATIVE   Bilirubin Urine NEGATIVE NEGATIVE   Ketones, ur NEGATIVE NEGATIVE mg/dL   Protein, ur >300 (A) NEGATIVE mg/dL   Nitrite NEGATIVE NEGATIVE   Leukocytes, UA TRACE (A) NEGATIVE  Urine culture     Status: None (Preliminary  result)   Collection Time: 01/14/2016  7:42 AM  Result Value Ref Range   Specimen Description URINE, CATHETERIZED    Special Requests NONE    Culture      TOO YOUNG TO READ Performed at [facility]    Report Status PENDING   Urine microscopic-add on     Status: Abnormal   Collection Time: 01/18/2016  7:42 AM  Result Value Ref Range   Squamous Epithelial / LPF 6-30 (A) NONE SEEN   WBC, UA 6-30 0 - 5 WBC/hpf   RBC / HPF TOO NUMEROUS TO COUNT 0 - 5 RBC/hpf   Bacteria, UA MANY (A) NONE SEEN  Blood gas, arterial     Status: Abnormal   Collection Time: 12/29/2015  7:55 AM  Result Value Ref Range   FIO2 100.00    Delivery systems VENTILATOR    Mode PRESSURE REGULATED VOLUME CONTROL    VT 480 mL   LHR 22 resp/min   Peep/cpap 12.0 cm H20   pH, Arterial 7.444 7.350 - 7.450   pCO2 arterial 46.4 (H) 35.0 - 45.0 mmHg   pO2, Arterial 484 (H) 80.0 - 100.0 mmHg   O2 Saturation ABOVE REPORTABLE RANGE %    Comment: J.CRUISE,RN AT 0811 BY V.LAWSON,RRT1/21/17   Patient temperature 37.0    Collection site LEFT RADIAL    Drawn by [id number]    Sample type ARTERIAL    Allens test (pass/fail) PASS PASS  TSH  Status: None   Collection Time: 01/19/2016 10:37 AM  Result Value Ref Range   TSH 1.489 0.350 - 4.500 uIU/mL  Troponin I     Status: Abnormal   Collection Time: 12/29/2015 10:37 AM  Result Value Ref Range   Troponin I 0.12 (H) <0.031 ng/mL    Comment:        PERSISTENTLY INCREASED TROPONIN VALUES IN THE RANGE OF 0.04-0.49 ng/mL CAN BE SEEN IN:       -UNSTABLE ANGINA       -CONGESTIVE HEART FAILURE       -MYOCARDITIS       -CHEST TRAUMA       -ARRYHTHMIAS       -LATE PRESENTING MYOCARDIAL INFARCTION       -COPD   CLINICAL FOLLOW-UP RECOMMENDED.   MRSA PCR Screening     Status: Abnormal   Collection Time: 12/21/2015 11:07 AM  Result Value Ref Range   MRSA by PCR POSITIVE (A) NEGATIVE    Comment:        The GeneXpert MRSA Assay (FDA approved for NASAL specimens only), is one  component of a comprehensive MRSA colonization surveillance program. It is not intended to diagnose MRSA infection nor to guide or monitor treatment for MRSA infections. RESULT CALLED TO, READ BACK BY AND VERIFIED WITH: SCHOENEWITZ L. AT 5638 ON [id number] BY THOMPSON S.   Draw ABG 1 hour after initiation of ventilator     Status: Abnormal   Collection Time: 01/09/2016 11:20 AM  Result Value Ref Range   FIO2 0.50    Delivery systems VENTILATOR    Mode PRESSURE REGULATED VOLUME CONTROL    VT 480 mL   LHR 22 resp/min   Peep/cpap 5.0 cm H20   pH, Arterial 7.520 (H) 7.350 - 7.450   pCO2 arterial 41.5 35.0 - 45.0 mmHg   pO2, Arterial 60.6 (L) 80.0 - 100.0 mmHg   Bicarbonate 33.6 (H) 20.0 - 24.0 mEq/L   Acid-Base Excess 10.1 (H) 0.0 - 2.0 mmol/L   O2 Saturation 94.0 %   Patient temperature 37.0    Collection site LEFT RADIAL    Drawn by [id number]    Sample type ARTERIAL DRAW    Allens test (pass/fail) PASS PASS  Glucose, capillary     Status: None   Collection Time: 01/20/2016 12:07 PM  Result Value Ref Range   Glucose-Capillary 71 65 - 99 mg/dL  Glucose, capillary     Status: Abnormal   Collection Time: 01/16/2016  3:36 PM  Result Value Ref Range   Glucose-Capillary 153 (H) 65 - 99 mg/dL   Comment 1 Notify RN    Comment 2 Document in Chart   Troponin I     Status: Abnormal   Collection Time: 01/07/2016  3:54 PM  Result Value Ref Range   Troponin I 0.13 (H) <0.031 ng/mL    Comment:        PERSISTENTLY INCREASED TROPONIN VALUES IN THE RANGE OF 0.04-0.49 ng/mL CAN BE SEEN IN:       -UNSTABLE ANGINA       -CONGESTIVE HEART FAILURE       -MYOCARDITIS       -CHEST TRAUMA       -ARRYHTHMIAS       -LATE PRESENTING MYOCARDIAL INFARCTION       -COPD   CLINICAL FOLLOW-UP RECOMMENDED.   Lactic acid, plasma     Status: Abnormal   Collection Time: 01/20/2016  3:54 PM  Result Value Ref Range  Lactic Acid, Venous 2.7 (HH) 0.5 - 2.0 mmol/L    Comment: CRITICAL RESULT CALLED TO, READ BACK BY  AND VERIFIED WITH: COVINGTON,L AT 1653 ON 12/24/2015 BY WOODS, M   Glucose, capillary     Status: Abnormal   Collection Time: 01/06/2016  8:04 PM  Result Value Ref Range   Glucose-Capillary 263 (H) 65 - 99 mg/dL   Comment 1 Notify RN   Troponin I     Status: Abnormal   Collection Time: 01/13/2016  9:58 PM  Result Value Ref Range   Troponin I 0.06 (H) <0.031 ng/mL    Comment:        PERSISTENTLY INCREASED TROPONIN VALUES IN THE RANGE OF 0.04-0.49 ng/mL CAN BE SEEN IN:       -UNSTABLE ANGINA       -CONGESTIVE HEART FAILURE       -MYOCARDITIS       -CHEST TRAUMA       -ARRYHTHMIAS       -LATE PRESENTING MYOCARDIAL INFARCTION       -COPD   CLINICAL FOLLOW-UP RECOMMENDED.   Glucose, capillary     Status: Abnormal   Collection Time: 01/02/2016 11:13 PM  Result Value Ref Range   Glucose-Capillary 175 (H) 65 - 99 mg/dL  Glucose, capillary     Status: Abnormal   Collection Time: 01/11/16  3:53 AM  Result Value Ref Range   Glucose-Capillary 201 (H) 65 - 99 mg/dL   Comment 1 Notify RN   Comprehensive metabolic panel     Status: Abnormal   Collection Time: 01/11/16  4:30 AM  Result Value Ref Range   Sodium 159 (H) 135 - 145 mmol/L    Comment: DELTA CHECK NOTED   Potassium 2.7 (LL) 3.5 - 5.1 mmol/L    Comment: DELTA CHECK NOTED CRITICAL RESULT CALLED TO, READ BACK BY AND VERIFIED WITH: WAGNOR,R AT 8299 ON 01/11/2016 BY WOODS,M    Chloride 118 (H) 101 - 111 mmol/L   CO2 32 22 - 32 mmol/L   Glucose, Bld 195 (H) 65 - 99 mg/dL   BUN 10 6 - 20 mg/dL   Creatinine, Ser 0.62 0.44 - 1.00 mg/dL   Calcium 9.1 8.9 - 10.3 mg/dL   Total Protein 5.3 (L) 6.5 - 8.1 g/dL   Albumin 2.9 (L) 3.5 - 5.0 g/dL   AST 105 (H) 15 - 41 U/L   ALT 73 (H) 14 - 54 U/L   Alkaline Phosphatase 51 38 - 126 U/L   Total Bilirubin 0.5 0.3 - 1.2 mg/dL   GFR calc non Af Amer >60 >60 mL/min   GFR calc Af Amer >60 >60 mL/min    Comment: (NOTE) The eGFR has been calculated using the CKD EPI equation. This calculation has  not been validated in all clinical situations. eGFR's persistently <60 mL/min signify possible Chronic Kidney Disease.    Anion gap 9 5 - 15  CBC     Status: Abnormal   Collection Time: 01/11/16  4:30 AM  Result Value Ref Range   WBC 6.2 4.0 - 10.5 K/uL   RBC 4.28 3.87 - 5.11 MIL/uL   Hemoglobin 12.3 12.0 - 15.0 g/dL   HCT 41.4 36.0 - 46.0 %   MCV 96.7 78.0 - 100.0 fL    Comment: DELTA CHECK NOTED RESULT REPEATED AND VERIFIED    MCH 28.7 26.0 - 34.0 pg   MCHC 29.7 (L) 30.0 - 36.0 g/dL   RDW 15.1 11.5 - 15.5 %   Platelets  124 (L) 150 - 400 K/uL  Glucose, capillary     Status: Abnormal   Collection Time: 01/11/16  7:52 AM  Result Value Ref Range   Glucose-Capillary 190 (H) 65 - 99 mg/dL   Comment 1 Notify RN    Comment 2 Document in Chart   Glucose, capillary     Status: Abnormal   Collection Time: 01/11/16 11:53 AM  Result Value Ref Range   Glucose-Capillary 209 (H) 65 - 99 mg/dL  Glucose, capillary     Status: Abnormal   Collection Time: 01/11/16  4:11 PM  Result Value Ref Range   Glucose-Capillary 211 (H) 65 - 99 mg/dL   Comment 1 Notify RN    Comment 2 Document in Chart   Glucose, capillary     Status: Abnormal   Collection Time: 01/11/16  7:51 PM  Result Value Ref Range   Glucose-Capillary 183 (H) 65 - 99 mg/dL   Comment 1 Notify RN   Glucose, capillary     Status: Abnormal   Collection Time: 01/11/16 11:11 PM  Result Value Ref Range   Glucose-Capillary 165 (H) 65 - 99 mg/dL  Blood gas, arterial     Status: Abnormal   Collection Time: January 20, 2016 12:08 AM  Result Value Ref Range   FIO2 0.40    Delivery systems VENTILATOR    Mode PRESSURE REGULATED VOLUME CONTROL    VT 400.0 mL   LHR 18.0 resp/min   Peep/cpap 5.0 cm H20   pH, Arterial 7.525 (H) 7.350 - 7.450   pCO2 arterial 37.5 35.0 - 45.0 mmHg   pO2, Arterial 96.4 80.0 - 100.0 mmHg   Bicarbonate 31.5 (H) 20.0 - 24.0 mEq/L   TCO2 16.0 0 - 100 mmol/L   Acid-Base Excess 7.5 (H) 0.0 - 2.0 mmol/L   O2  Saturation 97.9 %   Patient temperature 37.1    Collection site RIGHT RADIAL    Drawn by [id number]    Sample type ARTERIAL DRAW    Allens test (pass/fail) PASS PASS  Glucose, capillary     Status: Abnormal   Collection Time: 01-20-2016  3:44 AM  Result Value Ref Range   Glucose-Capillary 167 (H) 65 - 99 mg/dL  Basic metabolic panel     Status: Abnormal   Collection Time: 2016/01/20  4:30 AM  Result Value Ref Range   Sodium 181 (HH) 135 - 145 mmol/L    Comment: CRITICAL RESULT CALLED TO, READ BACK BY AND VERIFIED WITH: WAGONER,R AT 6:20AM ON 01-20-2016 BY FESTERMAN,C    Potassium 2.5 (LL) 3.5 - 5.1 mmol/L    Comment: CRITICAL RESULT CALLED TO, READ BACK BY AND VERIFIED WITH: WAGONER,R AT 6:20AM ON 01-20-16 BY FESTERMAN,C    Chloride >130 (HH) 101 - 111 mmol/L    Comment: CRITICAL RESULT CALLED TO, READ BACK BY AND VERIFIED WITH: WAGONER,R AT 6:20AM ON 20-Jan-2016 BY FESTERMAN,C    CO2 31 22 - 32 mmol/L   Glucose, Bld 226 (H) 65 - 99 mg/dL   BUN 10 6 - 20 mg/dL   Creatinine, Ser 0.83 0.44 - 1.00 mg/dL   Calcium 9.7 8.9 - 10.3 mg/dL   GFR calc non Af Amer >60 >60 mL/min   GFR calc Af Amer >60 >60 mL/min    Comment: (NOTE) The eGFR has been calculated using the CKD EPI equation. This calculation has not been validated in all clinical situations. eGFR's persistently <60 mL/min signify possible Chronic Kidney Disease.     ABGS  Recent Labs  20-Jan-2016  0008  PHART 7.525*  PO2ART 96.4  TCO2 16.0  HCO3 31.5*   CULTURES Recent Results (from the past 240 hour(s))  Urine culture     Status: None (Preliminary result)   Collection Time: 01/19/2016  7:42 AM  Result Value Ref Range Status   Specimen Description URINE, CATHETERIZED  Final   Special Requests NONE  Final   Culture   Final    TOO YOUNG TO READ Performed at [facility]    Report Status PENDING  Incomplete  MRSA PCR Screening     Status: Abnormal   Collection Time: 12/21/2015 11:07 AM  Result Value Ref Range Status    MRSA by PCR POSITIVE (A) NEGATIVE Final    Comment:        The GeneXpert MRSA Assay (FDA approved for NASAL specimens only), is one component of a comprehensive MRSA colonization surveillance program. It is not intended to diagnose MRSA infection nor to guide or monitor treatment for MRSA infections. RESULT CALLED TO, READ BACK BY AND VERIFIED WITH: SCHOENEWITZ L. AT 6283 ON [id number] BY THOMPSON S.    Studies/Results: Dg Chest Port 1 View  01/17/2016  CLINICAL DATA:  Status post adjustment of endotracheal tube EXAM: PORTABLE CHEST - 1 VIEW COMPARISON:  Film from earlier in the same day FINDINGS: Cardiac shadow is mildly enlarged but stable. An endotracheal tube is again seen and has been withdrawn now lying 4 cm above the carina. Nasogastric catheter is noted extending into the stomach. The lungs are clear with the exception of some mild right basilar atelectasis stable from the prior study. No bony abnormality is seen. Left-sided PICC line is noted in the proximal SVC. IMPRESSION: Endotracheal tube in satisfactory position. Right basilar atelectasis stable from the prior exam. Electronically Signed   By: Inez Catalina M.D.   On: 12/22/2015 08:56    Medications:  Prior to Admission:  Prescriptions prior to admission  Medication Sig Dispense Refill Last Dose  . acetaminophen (TYLENOL) 500 MG tablet Take 1,000 mg by mouth every 6 (six) hours as needed for mild pain or moderate pain.   01/08/2016 at Unknown time  . albuterol (ACCUNEB) 0.63 MG/3ML nebulizer solution Take 1 ampule by nebulization every 6 (six) hours.   12/26/2015 at Unknown time  . albuterol (PROVENTIL HFA;VENTOLIN HFA) 108 (90 BASE) MCG/ACT inhaler Inhale 2 puffs into the lungs every 4 (four) hours as needed. Shortness of breath 1 Inhaler 12 01/17/2016 at Unknown time  . ALPRAZolam (XANAX) 0.5 MG tablet Take 1 tablet (0.5 mg total) by mouth 3 (three) times daily as needed for anxiety. (Patient taking differently: Take 0.25 mg by  mouth every 6 (six) hours. )  0 01/03/2016 at Unknown time  . aspirin EC 81 MG tablet Take 81 mg by mouth daily.   01/09/2016 at Unknown time  . atorvastatin (LIPITOR) 40 MG tablet Take 40 mg by mouth daily.   01/09/2016 at Unknown time  . baclofen (LIORESAL) 10 MG tablet Take 10 mg by mouth 3 (three) times daily.    01/09/2016 at Unknown time  . cholecalciferol (VITAMIN D) 1000 UNITS tablet Take 1,000 Units by mouth daily.   01/09/2016 at Unknown time  . clopidogrel (PLAVIX) 75 MG tablet Take 1 tablet (75 mg total) by mouth daily with breakfast.   01/09/2016 at 1000  . Cranberry 450 MG CAPS Take 1 capsule by mouth daily.   01/09/2016 at Unknown time  . feeding supplement, GLUCERNA SHAKE, (GLUCERNA SHAKE) LIQD Take 237 mLs  by mouth 2 (two) times daily between meals. 60 Can 0 01/09/2016 at Unknown time  . furosemide (LASIX) 20 MG tablet Take 2 tablets (40 mg total) by mouth daily. 30 tablet 12 01/09/2016 at Unknown time  . gabapentin (NEURONTIN) 100 MG capsule Take 100 mg by mouth 2 (two) times daily.   01/09/2016 at Unknown time  . haloperidol (HALDOL) 0.5 MG tablet Take 0.5 mg by mouth daily.    01/09/2016 at Unknown time  . HEPARIN LOCK FLUSH IV Inject 5 mLs into the vein 3 (three) times daily. Using SASH method   01/09/2016 at Unknown time  . levothyroxine (SYNTHROID, LEVOTHROID) 100 MCG tablet Take 100 mcg by mouth daily before breakfast.    unknown  . loratadine (CLARITIN) 10 MG tablet Take 10 mg by mouth daily.   01/09/2016 at Unknown time  . metoprolol tartrate (LOPRESSOR) 25 MG tablet Take 0.5 tablets (12.5 mg total) by mouth 2 (two) times daily. 180 tablet 3 01/09/2016 at Unknown time  . nitroGLYCERIN (NITROSTAT) 0.4 MG SL tablet Place 1 tablet (0.4 mg total) under the tongue every 5 (five) minutes as needed for chest pain. 25 tablet 12 unknown  . potassium chloride SA (K-DUR,KLOR-CON) 20 MEQ tablet Take 2 tablets (40 mEq total) by mouth 2 (two) times daily. 60 tablet 12 01/09/2016 at Unknown time  .  ranolazine (RANEXA) 500 MG 12 hr tablet Take 1 tablet (500 mg total) by mouth 2 (two) times daily. 60 tablet 3 01/09/2016 at Unknown time  . sertraline (ZOLOFT) 100 MG tablet Take 100 mg by mouth at bedtime.    unknown  . sodium chloride 0.9 % injection Inject 10 mLs into the vein 3 (three) times daily. Using SASH method.   01/09/2016 at Unknown time  . temazepam (RESTORIL) 7.5 MG capsule Take 7.5 mg by mouth at bedtime as needed for sleep.   Past Week at Unknown time  . traMADol (ULTRAM) 50 MG tablet Take 50 mg by mouth every 6 (six) hours as needed for moderate pain.   01/04/2016 at Unknown time  . ZINC OXIDE, TOPICAL, 10 % CREA Apply 1 application topically 3 (three) times daily as needed (to inner thighs for irratation.).   Unknown  . [DISCONTINUED] levothyroxine (SYNTHROID, LEVOTHROID) 100 MCG tablet Take 100 mcg by mouth daily before breakfast.   12/22/2015 at 600a   Scheduled: . albuterol  2.5 mg Nebulization Q6H  . antiseptic oral rinse  7 mL Mouth Rinse 10 times per day  . chlorhexidine gluconate  15 mL Mouth Rinse BID  . Chlorhexidine Gluconate Cloth  6 each Topical Q0600  . enoxaparin (LOVENOX) injection  40 mg Subcutaneous Q24H  . insulin aspart  0-20 Units Subcutaneous 6 times per day  . levothyroxine  50 mcg Intravenous Daily  . methylPREDNISolone (SOLU-MEDROL) injection  40 mg Intravenous Q6H  . mupirocin ointment  1 application Nasal BID  . pantoprazole (PROTONIX) IV  40 mg Intravenous Q12H  . piperacillin-tazobactam (ZOSYN)  IV  3.375 g Intravenous Q8H  . potassium chloride  10 mEq Intravenous Q1 Hr x 6  . sodium chloride  3 mL Intravenous Q12H  . vancomycin  1,250 mg Intravenous Q12H   Continuous: . dextrose 125 mL/hr at February 06, 2016 0634  . DOPamine     HUD:JSHFWYOVZCHYI, ondansetron **OR** ondansetron (ZOFRAN) IV  Assesment: She had cardiopulmonary arrest. She has severe anoxic brain damage. I discussed her situation with her sister who shares power of attorney with her son  and attempted to discuss  with the son by telephone but have been unable to reach him. It was her wish that she not be kept on ventilator support and I think it's appropriate now for terminal weaning. Principal Problem:   Cardiopulmonary arrest [facility]) Active Problems:   Hypertension   Hypothyroidism   Acute on chronic respiratory failure ([place])   Cardiac arrest ([place])    Plan: Terminal weaning later today to see if family wants to be here    LOS: 2 days   Alvan Culpepper L Jan 15, 2016, 7:41 AM

## 2016-01-21 NOTE — Progress Notes (Signed)
Patient with no respirations, no pulse, no BP. Pronounced dead at 1148 by two RNs (Jovanni Rash Polo Riley, RN and Terence Lux, RN). MD notified. CDS called. Family notified.

## 2016-01-21 NOTE — Progress Notes (Signed)
Pt was terminally weaned to a 4 L [place]. Pt was wasn't responsive and her neuro status was declining. Extubated per Dr. Luan Pulling. Family didn't want to be in the room for the Pt's extubation

## 2016-01-21 DEATH — deceased

## 2016-01-30 ENCOUNTER — Other Ambulatory Visit (HOSPITAL_COMMUNITY): Payer: Medicare Other

## 2016-01-30 ENCOUNTER — Ambulatory Visit (HOSPITAL_COMMUNITY): Payer: Medicare Other | Admitting: Oncology

## 2016-01-30 ENCOUNTER — Encounter (HOSPITAL_COMMUNITY): Payer: Medicare Other

## 2016-02-08 IMAGING — CT CT CHEST W/O CM
2 of 3 series · 15 of 36 positions shown, 18 images · non-contrast
Comparison: Plain film from earlier in the same day

CLINICAL DATA: Fever, hypoxia

EXAM:
CT CHEST WITHOUT CONTRAST
TECHNIQUE: Multidetector CT imaging of the chest was performed following the
standard protocol without IV contrast..

[Series 2: chestroutine 5.0 b40f · axial · 0.68mm/px · z∈[-278,-28]mm · 12 of 60 slices shown, 15 images]
[im 5/60  mediastinal]
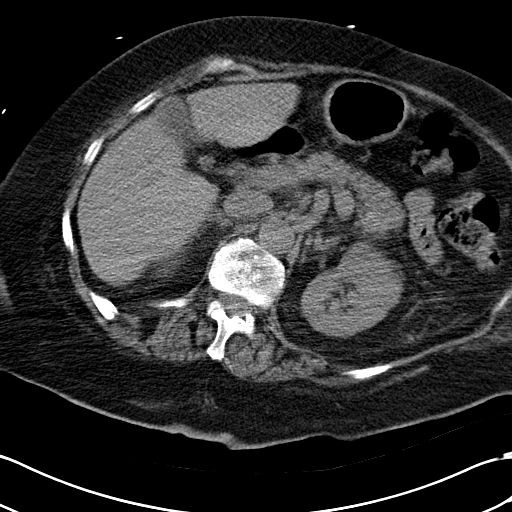
[im 5/60  lung]
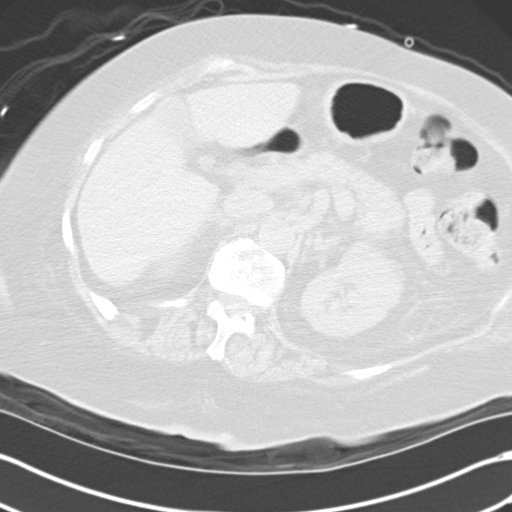
[im 9/60  lung]
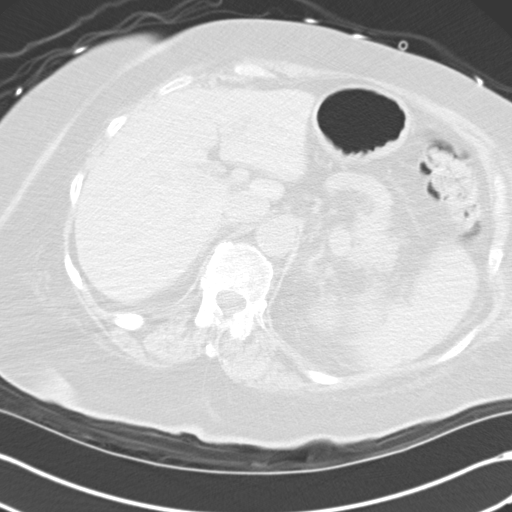
[im 14/60  lung]
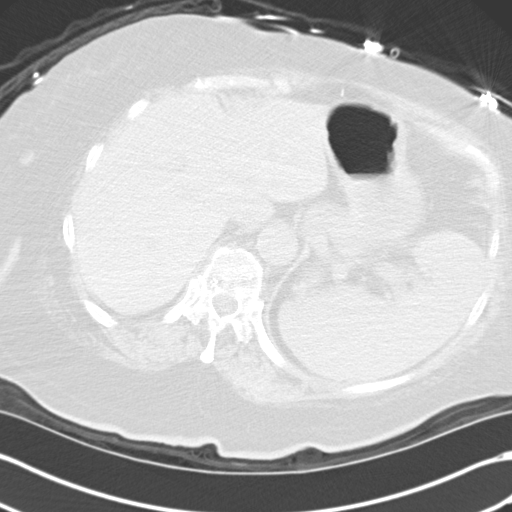
[im 18/60  lung]
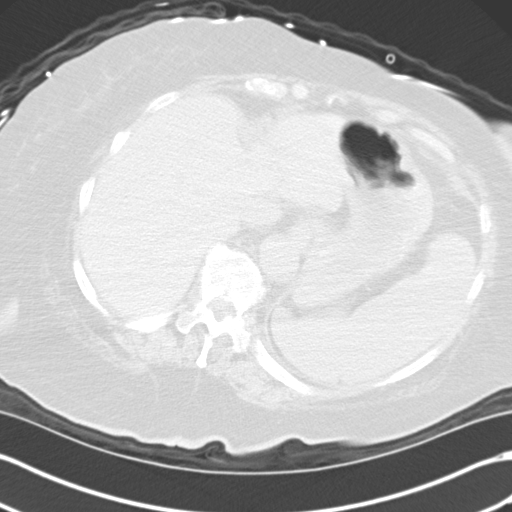
[im 22/60  mediastinal]
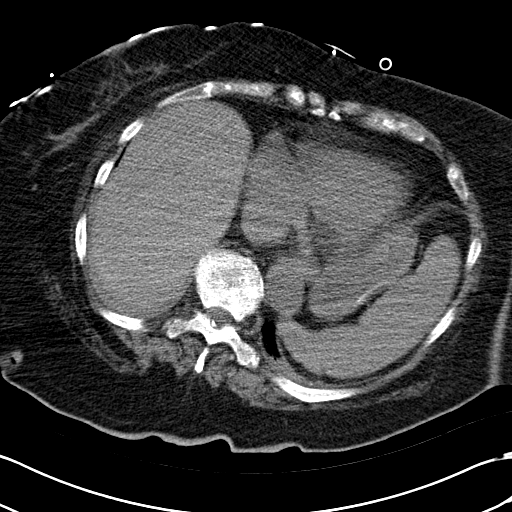
[im 22/60  lung]
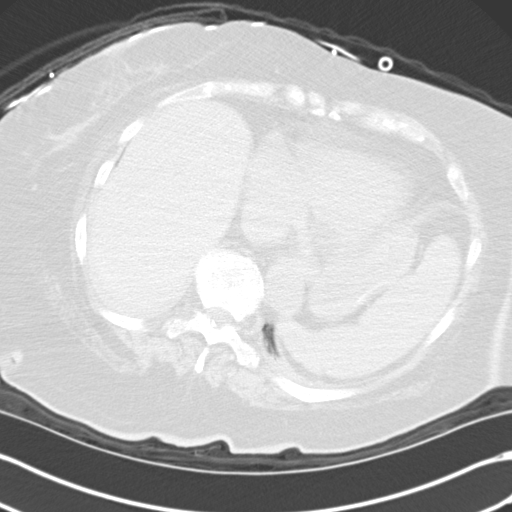
[im 27/60  lung]
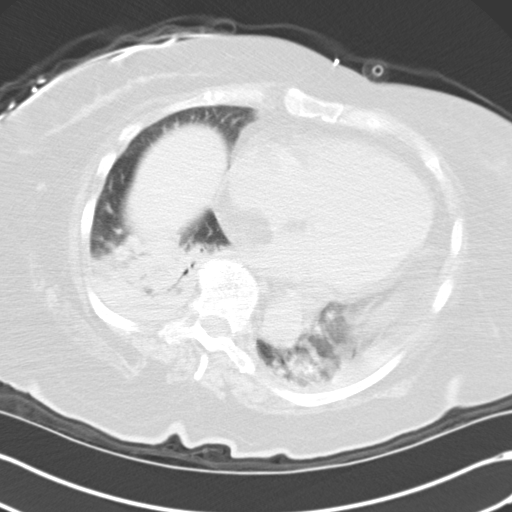
[im 33/60  lung]
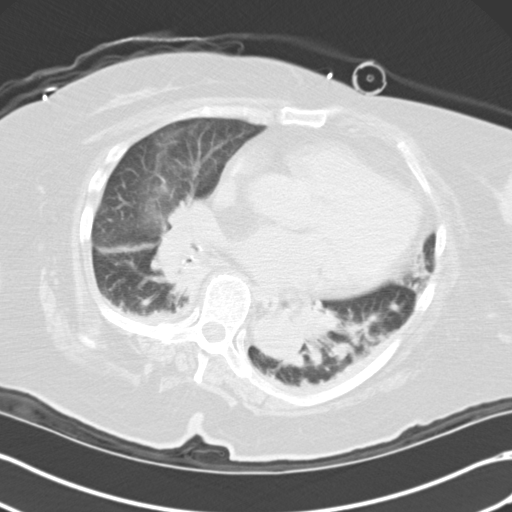
[im 38/60  lung]
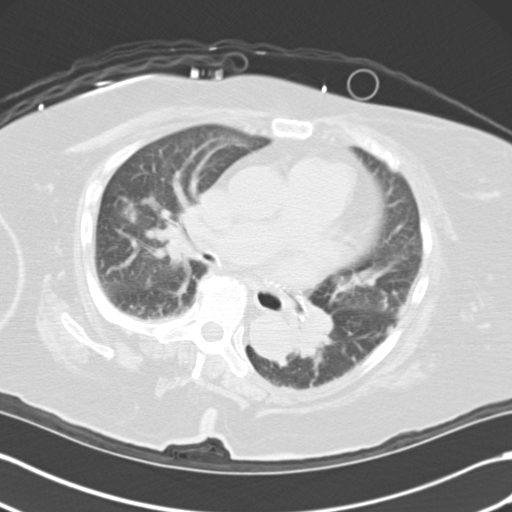
[im 42/60  mediastinal]
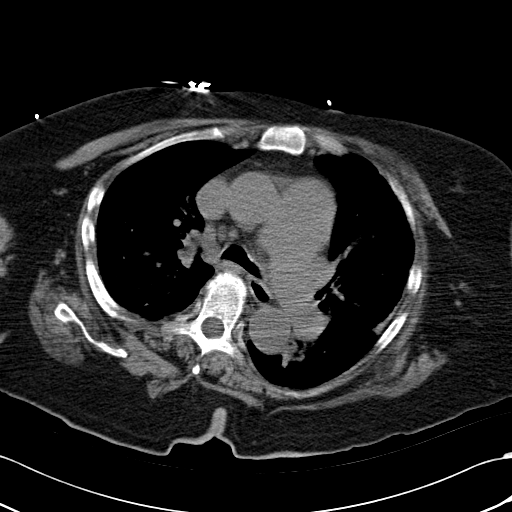
[im 42/60  lung]
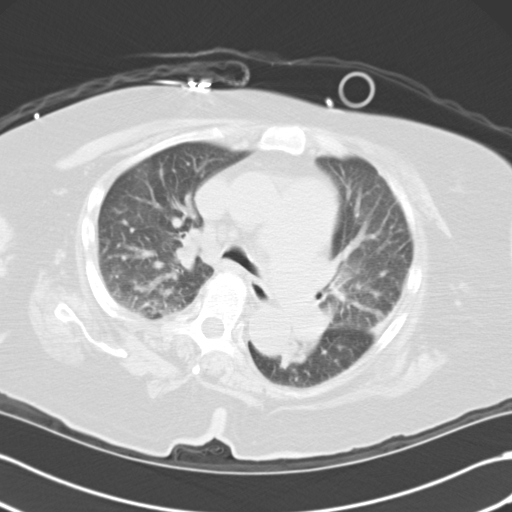
[im 46/60  lung]
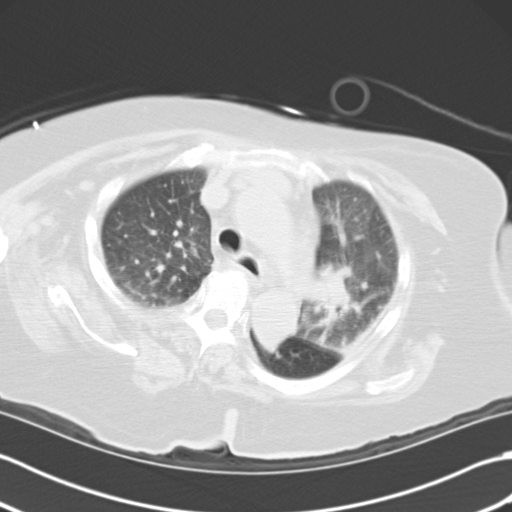
[im 51/60  lung]
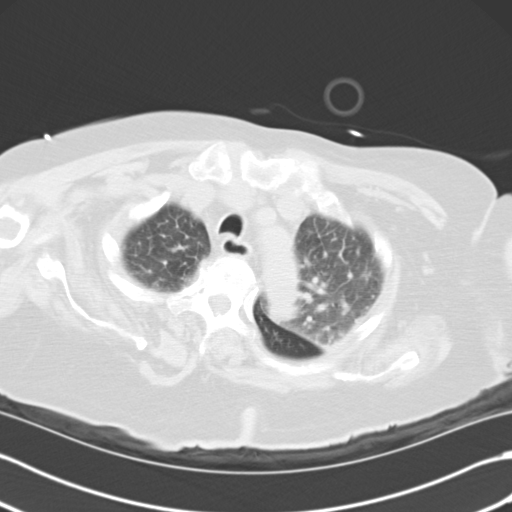
[im 55/60  lung]
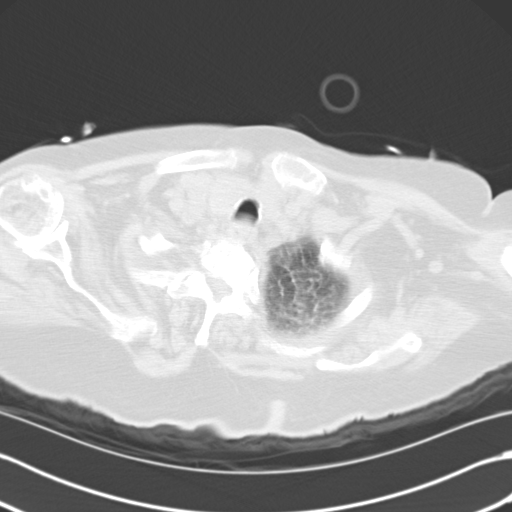

[Series 4: mpr coro 3mm · coronal · 0.60mm/px · 3 of 87 slices shown]
[im 18/87  lung]
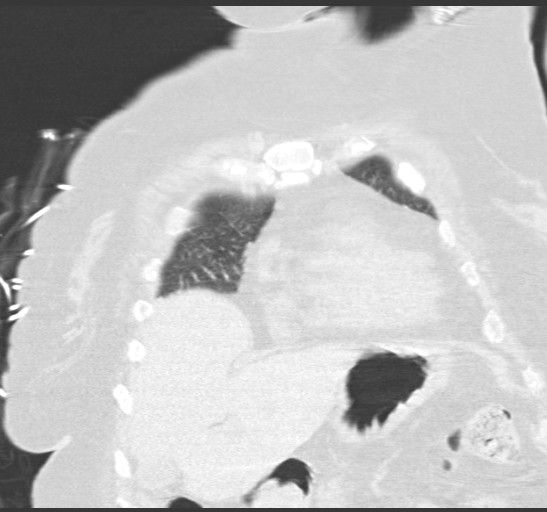
[im 35/87  lung]
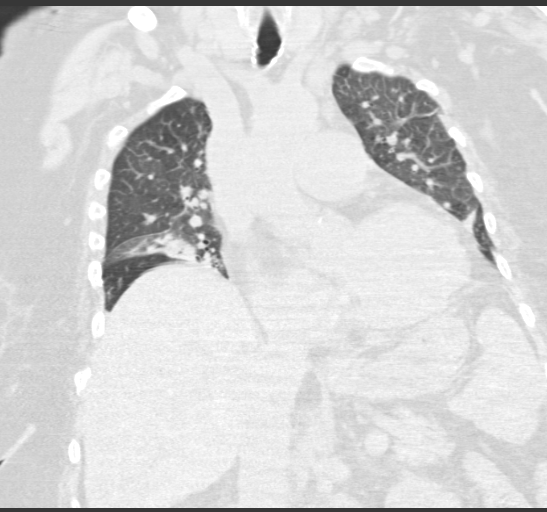
[im 52/87  lung]
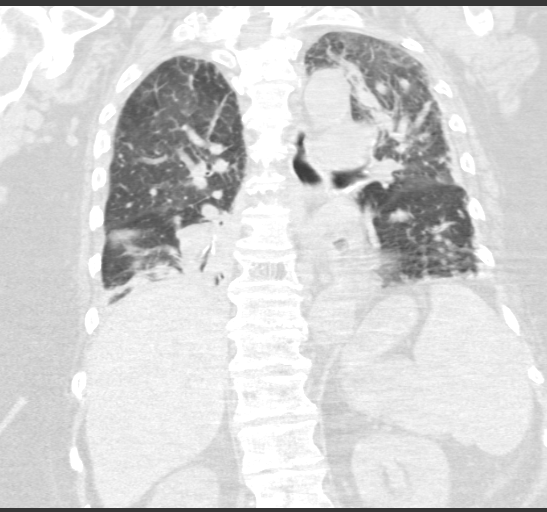

[15 of 36 positions shown; findings below may reference images not displayed]

FINDINGS: The lungs are with well aerated bilaterally with evidence of
vascular congestion and some patchy ground-glass changes likely
related to parenchymal edema. Mild lower lobe atelectatic changes
are seen. No sizable effusion is noted. The hilar and mediastinal
structures show no significant lymphadenopathy. The pulmonary artery
is mildly prominent consistent with the vascular congestion.

Scanning into the upper abdomen reveals no acute abnormality. Bony
structures show degenerative change of the thoracic spine. Changes
consistent with a vertebral hemangioma are noted at T8. At the
inferior aspect of L1 on the last image there is a lucency within
the L1 vertebral body. This is stable from a prior exam from
03/13/2014 and likely represent a second hemangioma.
IMPRESSION: Vertebral hemangiomas.

Mild vascular congestion and bibasilar atelectatic changes similar
to that seen on the prior plain film examination.

## 2016-02-08 IMAGING — CR DG CHEST 1V PORT SAME DAY
1 series · 2 of 2 positions shown · non-contrast
Comparison: Chest radiograph performed earlier today at [DATE] p.m.,
and CT of the chest performed earlier today at [DATE] p.m.

CLINICAL DATA: Endotracheal tube and central line placement.
Initial encounter.

EXAM:
PORTABLE CHEST - 1 VIEW SAME DAY

[Series 1: ap portable · 0.17mm/px · 2 of 2 slices shown]
[im 1/2]
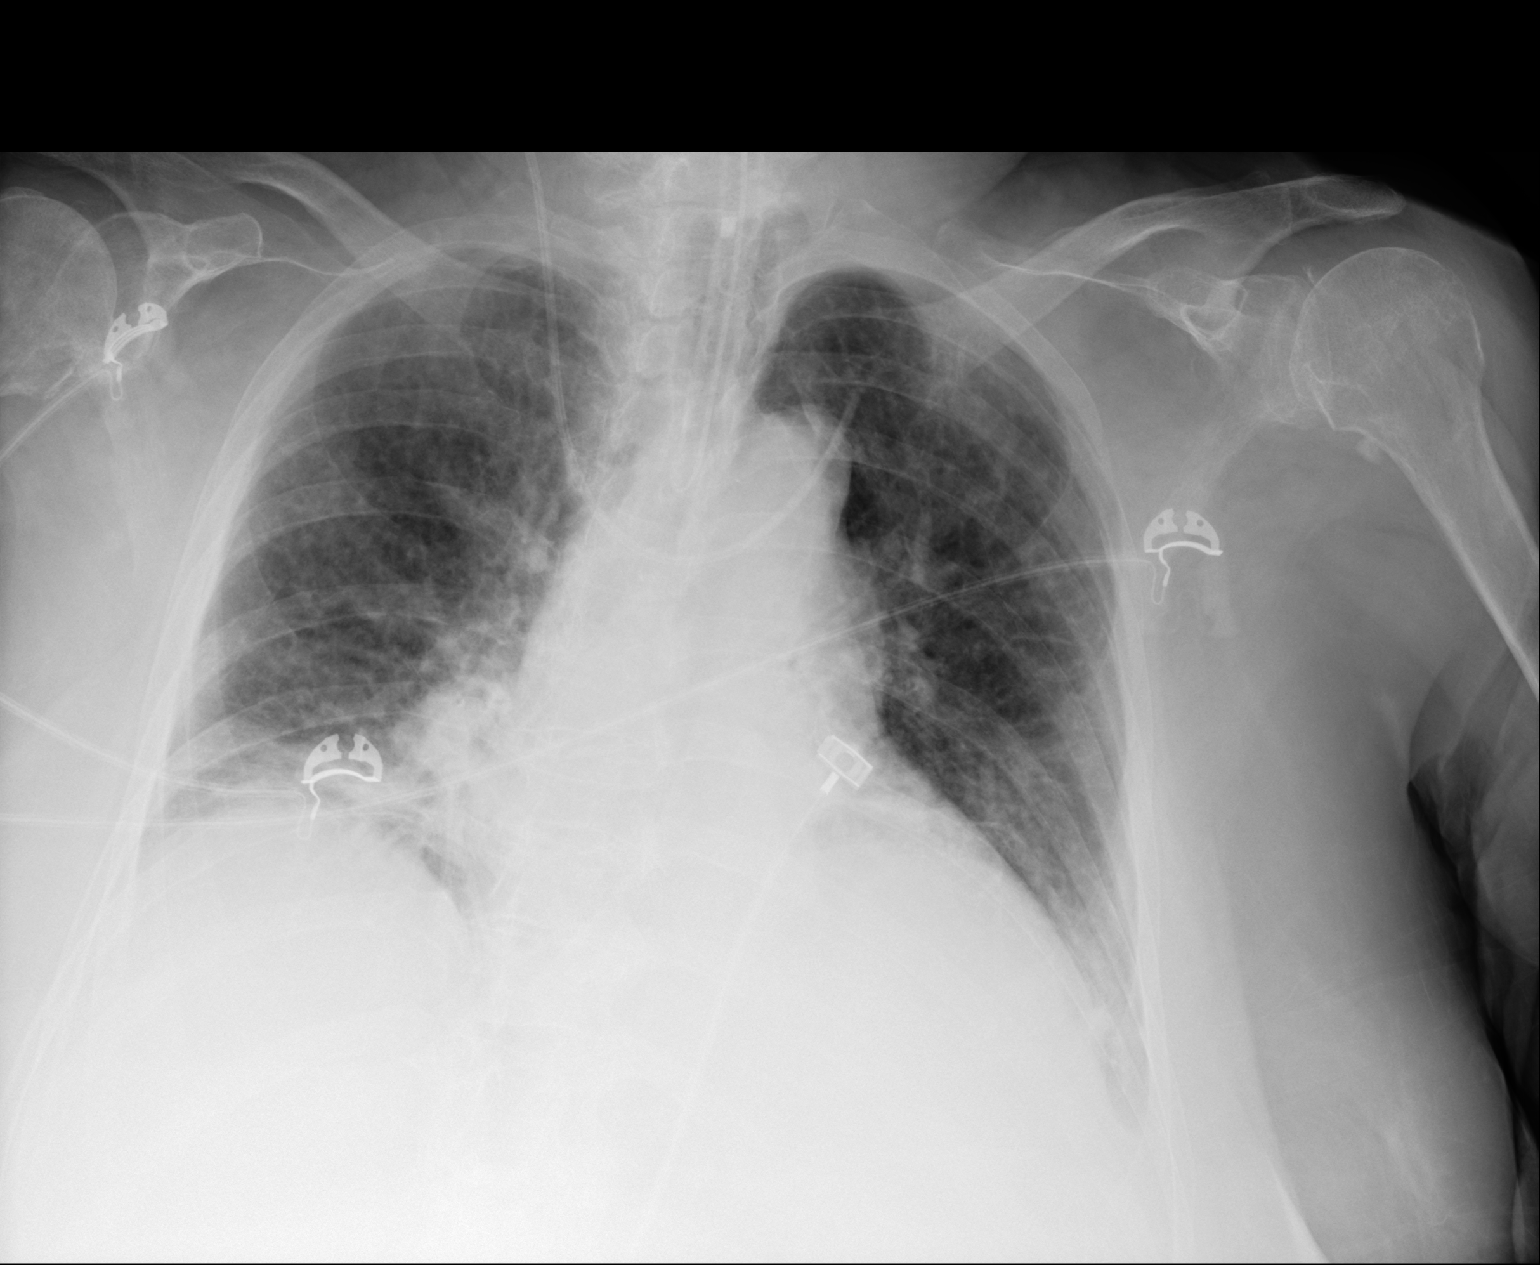
[im 2/2]
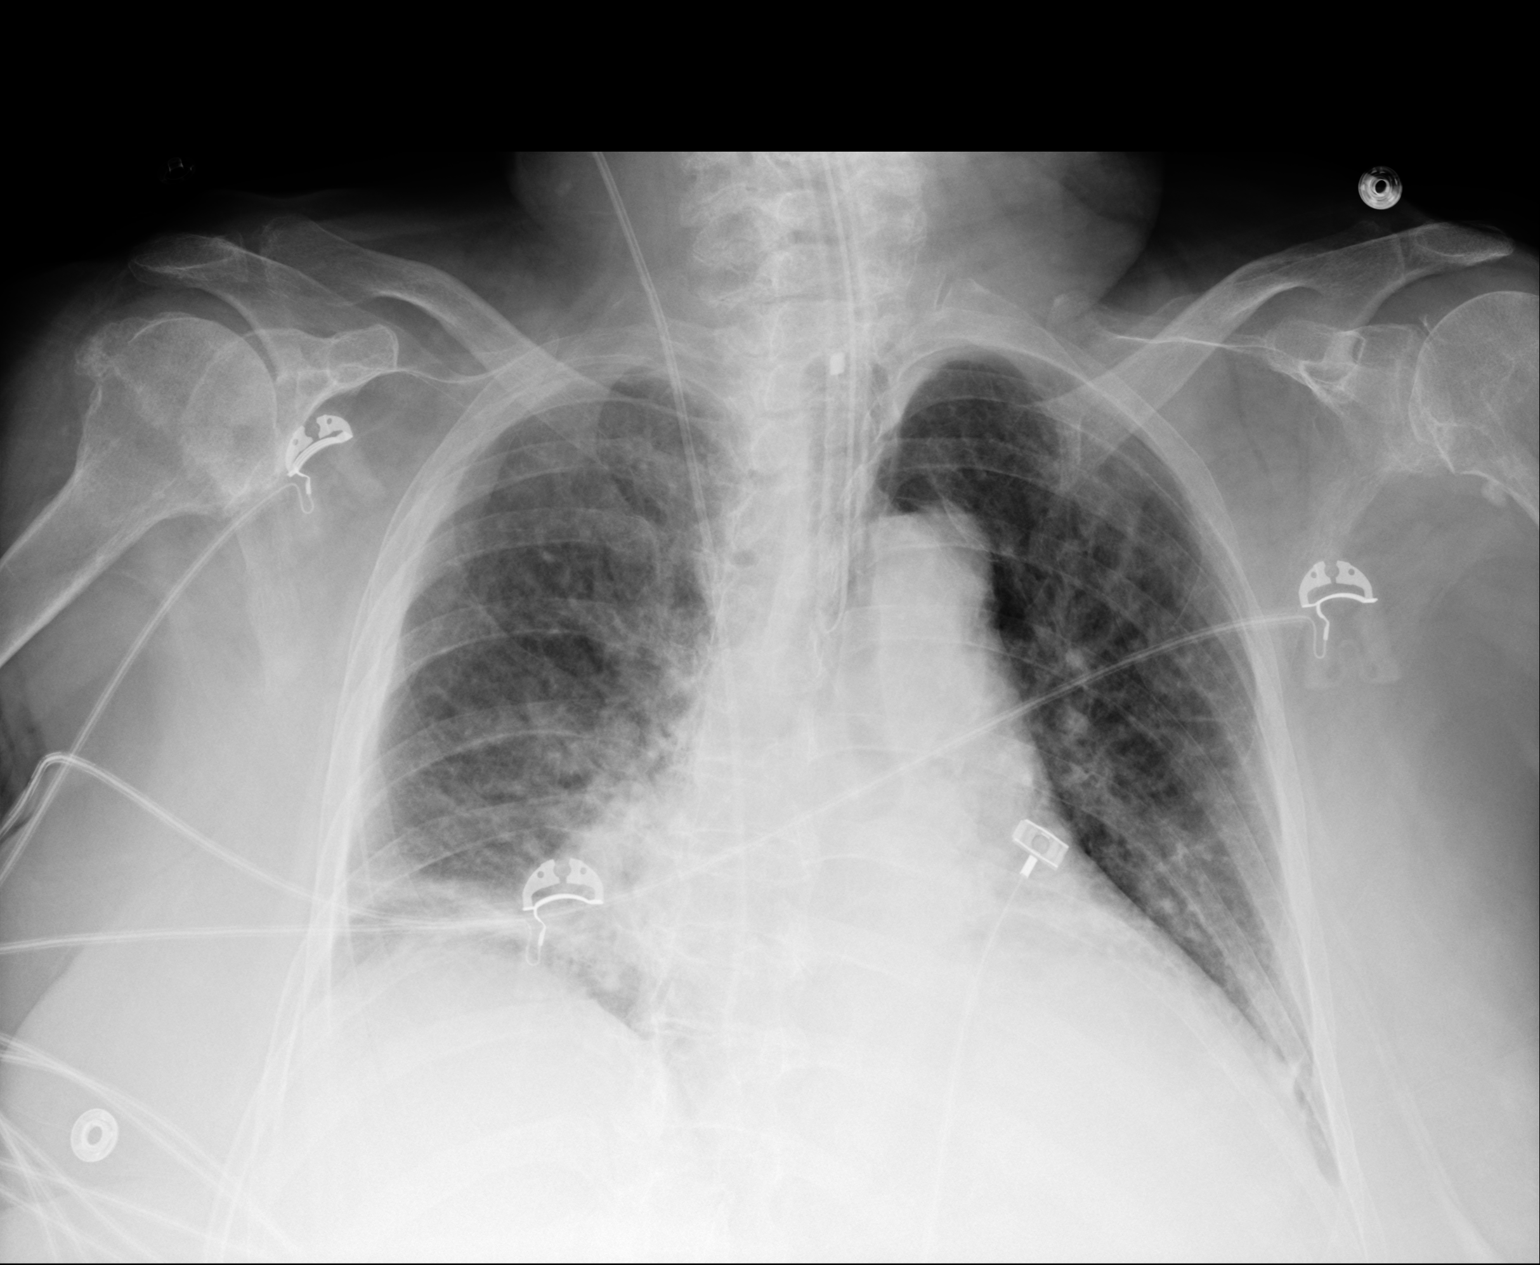

[2 of 2 positions shown; findings below may reference images not displayed]

FINDINGS: The patient endotracheal tube is seen ending 2-3 cm above the
carina. The right IJ line is noted crossing the midline and
extending into the left brachiocephalic vein. This could be
retracted 10 cm and readvanced.

The lungs are hypoexpanded. Vascular crowding and vascular
congestion noted. Bibasilar airspace opacities could reflect mild
interstitial edema. No definite pleural effusion or pneumothorax is
seen.

The cardiomediastinal silhouette is borderline enlarged. No acute
osseous abnormalities are seen.
IMPRESSION: 1. Endotracheal tube seen ending 2-3 cm above the carina.
2. Right IJ line noted crossing the midline and extending into the
left brachiocephalic vein. This could be retracted 10 cm and
readvanced.
3. Lungs hypoexpanded. Vascular congestion and borderline
cardiomegaly noted. Bibasilar airspace opacities could reflect mild
interstitial edema.

These results were called by telephone at the time of interpretation
on 12/24/2014 at [DATE] to Dr. NAGWA HAGEN, who verbally acknowledged
these results.

## 2016-02-18 NOTE — Discharge Summary (Signed)
[name], SCHAAFSMA                ACCOUNT NO.:  [id number]  MEDICAL RECORD NO.:  [id number]  LOCATION:  IC05                          FACILITY:  [facility]  PHYSICIAN:  Ferlando Lia L. Luan Pulling, M.D.DATE OF BIRTH:  09-24-1941  DATE OF ADMISSION:  12/21/2015 DATE OF DISCHARGE:  02-06-2017LH                              DISCHARGE SUMMARY   ADDENDUM:  CLINICAL DEATH SUMMARY  DISCHARGE DIAGNOSES:  Hypokalemia, hyponatremia, and metabolic acidemia, diabetes mellitus.     [name] L. Luan Pulling, M.D.     ELH/MEDQ  D:  01/28/2016  T:  01/28/2016  Job:  [id number]

## 2016-06-09 ENCOUNTER — Other Ambulatory Visit: Payer: Self-pay | Admitting: Nurse Practitioner
